# Patient Record
Sex: Female | Born: 1945 | Race: White | Hispanic: No | Smoking: Former smoker
Health system: Southern US, Community
[De-identification: ages and names within clinical notes are randomized; demographics above are authoritative.]

## PROBLEM LIST (undated history)

## (undated) DIAGNOSIS — J45909 Unspecified asthma, uncomplicated: Secondary | ICD-10-CM

## (undated) DIAGNOSIS — K5792 Diverticulitis of intestine, part unspecified, without perforation or abscess without bleeding: Secondary | ICD-10-CM

## (undated) DIAGNOSIS — E039 Hypothyroidism, unspecified: Secondary | ICD-10-CM

## (undated) DIAGNOSIS — L4050 Arthropathic psoriasis, unspecified: Secondary | ICD-10-CM

## (undated) HISTORY — DX: Diverticulitis of intestine, part unspecified, without perforation or abscess without bleeding: K57.92

## (undated) HISTORY — PX: HIP SURGERY: SHX245

## (undated) HISTORY — DX: Hypothyroidism, unspecified: E03.9

## (undated) HISTORY — DX: Arthropathic psoriasis, unspecified: L40.50

## (undated) HISTORY — PX: TONSILLECTOMY: SUR1361

## (undated) HISTORY — DX: Unspecified asthma, uncomplicated: J45.909

## (undated) HISTORY — PX: TUBAL LIGATION: SHX77

---

## 2006-03-13 DIAGNOSIS — S83289A Other tear of lateral meniscus, current injury, unspecified knee, initial encounter: Secondary | ICD-10-CM | POA: Insufficient documentation

## 2006-04-24 DIAGNOSIS — M47812 Spondylosis without myelopathy or radiculopathy, cervical region: Secondary | ICD-10-CM | POA: Insufficient documentation

## 2006-10-10 ENCOUNTER — Emergency Department (HOSPITAL_COMMUNITY): Admission: EM | Admit: 2006-10-10 | Discharge: 2006-10-10 | Payer: Self-pay | Admitting: Family Medicine

## 2010-09-06 DIAGNOSIS — F32A Depression, unspecified: Secondary | ICD-10-CM | POA: Insufficient documentation

## 2010-09-06 DIAGNOSIS — Z5181 Encounter for therapeutic drug level monitoring: Secondary | ICD-10-CM | POA: Insufficient documentation

## 2010-09-06 DIAGNOSIS — M545 Low back pain, unspecified: Secondary | ICD-10-CM | POA: Insufficient documentation

## 2010-09-06 DIAGNOSIS — M25559 Pain in unspecified hip: Secondary | ICD-10-CM | POA: Insufficient documentation

## 2010-09-06 DIAGNOSIS — E039 Hypothyroidism, unspecified: Secondary | ICD-10-CM | POA: Insufficient documentation

## 2010-09-06 DIAGNOSIS — J45909 Unspecified asthma, uncomplicated: Secondary | ICD-10-CM | POA: Insufficient documentation

## 2010-09-06 DIAGNOSIS — E538 Deficiency of other specified B group vitamins: Secondary | ICD-10-CM | POA: Insufficient documentation

## 2010-09-06 DIAGNOSIS — G629 Polyneuropathy, unspecified: Secondary | ICD-10-CM | POA: Insufficient documentation

## 2010-09-06 DIAGNOSIS — H9319 Tinnitus, unspecified ear: Secondary | ICD-10-CM | POA: Insufficient documentation

## 2010-09-06 DIAGNOSIS — M1990 Unspecified osteoarthritis, unspecified site: Secondary | ICD-10-CM | POA: Insufficient documentation

## 2010-09-06 DIAGNOSIS — L409 Psoriasis, unspecified: Secondary | ICD-10-CM | POA: Insufficient documentation

## 2010-09-06 DIAGNOSIS — Z889 Allergy status to unspecified drugs, medicaments and biological substances status: Secondary | ICD-10-CM | POA: Insufficient documentation

## 2010-09-06 DIAGNOSIS — F329 Major depressive disorder, single episode, unspecified: Secondary | ICD-10-CM | POA: Insufficient documentation

## 2011-09-26 DIAGNOSIS — E785 Hyperlipidemia, unspecified: Secondary | ICD-10-CM | POA: Insufficient documentation

## 2012-08-07 DIAGNOSIS — I10 Essential (primary) hypertension: Secondary | ICD-10-CM | POA: Insufficient documentation

## 2014-08-19 DIAGNOSIS — M940 Chondrocostal junction syndrome [Tietze]: Secondary | ICD-10-CM | POA: Insufficient documentation

## 2014-08-19 DIAGNOSIS — R911 Solitary pulmonary nodule: Secondary | ICD-10-CM | POA: Insufficient documentation

## 2014-08-19 DIAGNOSIS — R079 Chest pain, unspecified: Secondary | ICD-10-CM | POA: Insufficient documentation

## 2016-04-10 DIAGNOSIS — M170 Bilateral primary osteoarthritis of knee: Secondary | ICD-10-CM | POA: Insufficient documentation

## 2019-02-24 ENCOUNTER — Ambulatory Visit (INDEPENDENT_AMBULATORY_CARE_PROVIDER_SITE_OTHER): Payer: Medicare Other | Admitting: Allergy and Immunology

## 2019-02-24 ENCOUNTER — Encounter: Payer: Self-pay | Admitting: Allergy and Immunology

## 2019-02-24 ENCOUNTER — Other Ambulatory Visit: Payer: Self-pay

## 2019-02-24 VITALS — BP 128/70 | HR 72 | Temp 97.1°F | Resp 16 | Ht 68.0 in | Wt 192.5 lb

## 2019-02-24 DIAGNOSIS — J4540 Moderate persistent asthma, uncomplicated: Secondary | ICD-10-CM

## 2019-02-24 DIAGNOSIS — H1010 Acute atopic conjunctivitis, unspecified eye: Secondary | ICD-10-CM | POA: Insufficient documentation

## 2019-02-24 DIAGNOSIS — H1013 Acute atopic conjunctivitis, bilateral: Secondary | ICD-10-CM | POA: Diagnosis not present

## 2019-02-24 DIAGNOSIS — J3089 Other allergic rhinitis: Secondary | ICD-10-CM | POA: Diagnosis not present

## 2019-02-24 DIAGNOSIS — J302 Other seasonal allergic rhinitis: Secondary | ICD-10-CM | POA: Insufficient documentation

## 2019-02-24 MED ORDER — OLOPATADINE HCL 0.2 % OP SOLN: 1.0000 [drp] | OPHTHALMIC | 3 refills | Status: DC

## 2019-02-24 MED ORDER — MONTELUKAST SODIUM 10 MG PO TABS: 10.0000 mg | ORAL_TABLET | Freq: Every day | ORAL | 5 refills | Status: DC

## 2019-02-24 MED ORDER — BUDESONIDE-FORMOTEROL FUMARATE 160-4.5 MCG/ACT IN AERO: 2.0000 | INHALATION_SPRAY | Freq: Two times a day (BID) | RESPIRATORY_TRACT | 5 refills | Status: DC

## 2019-02-24 MED ORDER — ALBUTEROL SULFATE HFA 108 (90 BASE) MCG/ACT IN AERS: 1.0000 | INHALATION_SPRAY | RESPIRATORY_TRACT | 1 refills | Status: DC | PRN

## 2019-02-24 MED ORDER — AZELASTINE HCL 0.1 % NA SOLN: 1.0000 | Freq: Two times a day (BID) | NASAL | 5 refills | Status: DC | PRN

## 2019-02-24 NOTE — Assessment & Plan Note (Signed)
   Treatment plan as outlined above for allergic rhinitis.  A prescription has been provided for Pataday, one drop per eye daily as needed.  Eye lubricant drops (i.e., Natural Tears) as needed.

## 2019-02-24 NOTE — Assessment & Plan Note (Addendum)
   A refill prescription has been provided for Symbicort 160-4.5 g, 2 inhalations twice daily.  To maximize pulmonary deposition, a spacer has been provided along with instructions for its proper administration with an HFA inhaler.  A refill prescription has been provided for montelukast 10 mg daily at bedtime.  Continue albuterol HFA, 1 to 2 inhalations every 4-6 hours if needed.  Subjective and objective measures of pulmonary function will be followed and the treatment plan will be adjusted accordingly.

## 2019-02-24 NOTE — Patient Instructions (Addendum)
Moderate persistent asthma  A refill prescription has been provided for Symbicort 160-4.5 g, 2 inhalations twice daily.  To maximize pulmonary deposition, a spacer has been provided along with instructions for its proper administration with an HFA inhaler.  A refill prescription has been provided for montelukast 10 mg daily at bedtime.  Continue albuterol HFA, 1 to 2 inhalations every 4-6 hours if needed.  Subjective and objective measures of pulmonary function will be followed and the treatment plan will be adjusted accordingly.  Perennial and seasonal allergic rhinitis  We have requested copies of the patient's medical record from her previous allergist in [place].  Continue appropriate allergen avoidance measures.  I have recommended fexofenadine (Allegra) 180 mg daily if needed.  A prescription has been provided for azelastine nasal spray, 1-2 sprays per nostril 2 times daily as needed. Proper nasal spray technique has been discussed and demonstrated.   Nasal saline spray (i.e., Simply Saline) or nasal saline lavage (i.e., NeilMed) is recommended as needed and prior to medicated nasal sprays.  If allergen avoidance measures and medications fail to adequately relieve symptoms we will retest in 1 year and aeroallergen immunotherapy will be considered.  Allergic conjunctivitis  Treatment plan as outlined above for allergic rhinitis.  A prescription has been provided for Pataday, one drop per eye daily as needed.  Eye lubricant drops (i.e., Natural Tears) as needed.   Return in about 3 months (around 05/26/2019), or if symptoms worsen or fail to improve.  Control of Dust Mite Allergen  House dust mites play a major role in allergic asthma and rhinitis.  They occur in environments with high humidity wherever human skin, the food for dust mites is found. High levels have been detected in dust obtained from mattresses, pillows, carpets, upholstered furniture, bed covers, clothes  and soft toys.  The principal allergen of the house dust mite is found in its feces.  A gram of dust may contain 1,000 mites and 250,000 fecal particles.  Mite antigen is easily measured in the air during house cleaning activities.    1. Encase mattresses, including the box spring, and pillow, in an air tight cover.  Seal the zipper end of the encased mattresses with wide adhesive tape. 2. Wash the bedding in water of 130 degrees Farenheit weekly.  Avoid cotton comforters/quilts and flannel bedding: the most ideal bed covering is the dacron comforter. 3. Remove all upholstered furniture from the bedroom. 4. Remove carpets, carpet padding, rugs, and non-washable window drapes from the bedroom.  Wash drapes weekly or use plastic window coverings. 5. Remove all non-washable stuffed toys from the bedroom.  Wash stuffed toys weekly. 6. Have the room cleaned frequently with a vacuum cleaner and a damp dust-mop.  The patient should not be in a room which is being cleaned and should wait 1 hour after cleaning before going into the room. 7. Close and seal all heating outlets in the bedroom.  Otherwise, the room will become filled with dust-laden air.  An electric heater can be used to heat the room. Reduce indoor humidity to less than 50%.  Do not use a humidifier.   Reducing Pollen Exposure  The American Academy of Allergy, Asthma and Immunology suggests the following steps to reduce your exposure to pollen during allergy seasons.    1. Do not hang sheets or clothing out to dry; pollen may collect on these items. 2. Do not mow lawns or spend time around freshly cut grass; mowing stirs up pollen. 3. Keep windows  closed at night.  Keep car windows closed while driving. 4. Minimize morning activities outdoors, a time when pollen counts are usually at their highest. 5. Stay indoors as much as possible when pollen counts or humidity is high and on windy days when pollen tends to remain in the air  longer. 6. Use air conditioning when possible.  Many air conditioners have filters that trap the pollen spores. 7. Use a HEPA room air filter to remove pollen form the indoor air you breathe.   Control of Dog or Cat Allergen  Avoidance is the best way to manage a dog or cat allergy. If you have a dog or cat and are allergic to dog or cats, consider removing the dog or cat from the home. If you have a dog or cat but don't want to find it a new home, or if your family wants a pet even though someone in the household is allergic, here are some strategies that may help keep symptoms at bay:  1. Keep the pet out of your bedroom and restrict it to only a few rooms. Be advised that keeping the dog or cat in only one room will not limit the allergens to that room. 2. Don't pet, hug or kiss the dog or cat; if you do, wash your hands with soap and water. 3. High-efficiency particulate air (HEPA) cleaners run continuously in a bedroom or living room can reduce allergen levels over time. 4. Place electrostatic material sheet in the air inlet vent in the bedroom. 5. Regular use of a high-efficiency vacuum cleaner or a central vacuum can reduce allergen levels. 6. Giving your dog or cat a bath at least once a week can reduce airborne allergen.   Control of Mold Allergen  Mold and fungi can grow on a variety of surfaces provided certain temperature and moisture conditions exist.  Outdoor molds grow on plants, decaying vegetation and soil.  The major outdoor mold, Alternaria and Cladosporium, are found in very high numbers during hot and dry conditions.  Generally, a late Summer - Fall peak is seen for common outdoor fungal spores.  Rain will temporarily lower outdoor mold spore count, but counts rise rapidly when the rainy period ends.  The most important indoor molds are Aspergillus and Penicillium.  Dark, humid and poorly ventilated basements are ideal sites for mold growth.  The next most common sites of  mold growth are the bathroom and the kitchen.  Outdoor [employer] 1. Use air conditioning and keep windows closed 2. Avoid exposure to decaying vegetation. 3. Avoid leaf raking. 4. Avoid grain handling. 5. Consider wearing a face mask if working in moldy areas.  Indoor Mold Control 1. Maintain humidity below 50%. 2. Clean washable surfaces with 5% bleach solution. 3. Remove sources e.g. Contaminated carpets.   Control of Cockroach Allergen  Cockroach allergen has been identified as an important cause of acute attacks of asthma, especially in urban settings.  There are fifty-five species of cockroach that exist in the [place], however only three, the [place], [place] species produce allergen that can affect patients with Asthma.  Allergens can be obtained from fecal particles, egg casings and secretions from cockroaches.    1. Remove food sources. 2. Reduce access to water. 3. Seal access and entry points. 4. Spray runways with 0.5-1% Diazinon or Chlorpyrifos 5. Blow boric acid power under stoves and refrigerator. 6. Place bait stations (hydramethylnon) at feeding sites.

## 2019-02-24 NOTE — Assessment & Plan Note (Signed)
   We have requested copies of the patient's medical record from her previous allergist in [place].  Continue appropriate allergen avoidance measures.  I have recommended fexofenadine (Allegra) 180 mg daily if needed.  A prescription has been provided for azelastine nasal spray, 1-2 sprays per nostril 2 times daily as needed. Proper nasal spray technique has been discussed and demonstrated.   Nasal saline spray (i.e., Simply Saline) or nasal saline lavage (i.e., NeilMed) is recommended as needed and prior to medicated nasal sprays.  If allergen avoidance measures and medications fail to adequately relieve symptoms we will retest in 1 year and aeroallergen immunotherapy will be considered.

## 2019-02-24 NOTE — Progress Notes (Signed)
New Patient Note  RE: [name] MRN: [id number] DOB: 1945-09-05 Date of Office Visit: 02/24/2019  Referring provider: No ref. provider found Primary care provider: [name], Fransico Him, MD  Chief Complaint: Allergic Rhinitis  and Asthma  History of present illness: [name] [name] is a 73 y.o. female presenting today for evaluation of allergic rhinitis and asthma.  She moved from [place] to [place] approximately 1 month ago.  She has been on immunotherapy injections over the past 2 years and had her last injection approximately 2 months ago.  She reports that she was skin test positive to dog, cat, dust mite, mold, and pollen.  She attempts to control her nasal/sinus symptoms with loratadine, montelukast, and fluticasone nasal spray.  She carries a diagnosis of asthma which is currently stable with Symbicort 160-4.5 g, 2 inhalations twice daily, and montelukast 10 mg daily.  She does not use a spacer device with her HFA inhalers.  She does not experience limitations in normal daily activities or nocturnal awakenings due to lower respiratory symptoms.  She had been prescribed Spiriva at one point, however discontinued because she did not perceive benefit from this medication.  Assessment and plan: Moderate persistent asthma  A refill prescription has been provided for Symbicort 160-4.5 g, 2 inhalations twice daily.  To maximize pulmonary deposition, a spacer has been provided along with instructions for its proper administration with an HFA inhaler.  A refill prescription has been provided for montelukast 10 mg daily at bedtime.  Continue albuterol HFA, 1 to 2 inhalations every 4-6 hours if needed.  Subjective and objective measures of pulmonary function will be followed and the treatment plan will be adjusted accordingly.  Perennial and seasonal allergic rhinitis  We have requested copies of the patient's medical record from her previous allergist in [place].   Continue appropriate allergen avoidance measures.  I have recommended fexofenadine (Allegra) 180 mg daily if needed.  A prescription has been provided for azelastine nasal spray, 1-2 sprays per nostril 2 times daily as needed. Proper nasal spray technique has been discussed and demonstrated.   Nasal saline spray (i.e., Simply Saline) or nasal saline lavage (i.e., NeilMed) is recommended as needed and prior to medicated nasal sprays.  If allergen avoidance measures and medications fail to adequately relieve symptoms we will retest in 1 year and aeroallergen immunotherapy will be considered.  Allergic conjunctivitis  Treatment plan as outlined above for allergic rhinitis.  A prescription has been provided for Pataday, one drop per eye daily as needed.  Eye lubricant drops (i.e., Natural Tears) as needed.   Meds ordered this encounter  Medications  . budesonide-formoterol (SYMBICORT) 160-4.5 MCG/ACT inhaler    Sig: Inhale 2 puffs into the lungs 2 (two) times daily.    Dispense:  1 Inhaler    Refill:  5  . montelukast (SINGULAIR) 10 MG tablet    Sig: Take 1 tablet (10 mg total) by mouth at bedtime.    Dispense:  30 tablet    Refill:  5  . azelastine (ASTELIN) 0.1 % nasal spray    Sig: Place 1-2 sprays into both nostrils 2 (two) times daily as needed for rhinitis.    Dispense:  30 mL    Refill:  5  . albuterol (VENTOLIN HFA) 108 (90 Base) MCG/ACT inhaler    Sig: Inhale 1-2 puffs into the lungs every 4 (four) hours as needed for wheezing or shortness of breath.    Dispense:  18 g    Refill:  1  . Olopatadine HCl (PATADAY) 0.2 % SOLN    Sig: Place 1 drop into both eyes 1 day or 1 dose.    Dispense:  2.5 mL    Refill:  3    Diagnostics: Spirometry: FVC was 2.80 L and FEV1 was 1.98 L (75% predicted) with 110 mL (6%) postbronchodilator improvement.  This study was performed while the patient was asymptomatic.  Please see scanned spirometry results for details.  Physical  examination: Blood pressure 128/70, pulse 72, temperature (!) 97.1 F (36.2 C), temperature source Temporal, resp. rate 16, height 5\' 8"  (1.727 m), weight 192 lb 8 oz (87.3 kg), SpO2 97 %.  General: Alert, interactive, in no acute distress. HEENT: TMs pearly gray, turbinates moderately edematous without discharge, post-pharynx mildly erythematous. Neck: Supple without lymphadenopathy. Lungs: Clear to auscultation without wheezing, rhonchi or rales. CV: Normal S1, S2 without murmurs. Abdomen: Nondistended, nontender. Skin: Warm and dry, without lesions or rashes. Extremities:  No clubbing, cyanosis or edema. Neuro:   Grossly intact.  Review of systems:  Review of systems negative except as noted in HPI / PMHx or noted below: Review of Systems  Constitutional: Negative.   HENT: Negative.   Eyes: Negative.   Respiratory: Negative.   Cardiovascular: Negative.   Gastrointestinal: Negative.   Genitourinary: Negative.   Musculoskeletal: Negative.   Skin: Negative.   Neurological: Negative.   Endo/Heme/Allergies: Negative.   Psychiatric/Behavioral: Negative.     Past medical history:  Past Medical History:  Diagnosis Date  . Asthma     Past surgical history:  Past Surgical History:  Procedure Laterality Date  . TONSILLECTOMY      Family history: Family History  Problem Relation Age of Onset  . Asthma Mother   . Allergic rhinitis Mother   . Allergic rhinitis Father   . Eczema Brother   . Allergic rhinitis Brother     Social history: Social History   Socioeconomic History  . Marital status: Married    Spouse name: Not on file  . Number of children: Not on file  . Years of education: Not on file  . Highest education level: Not on file  Occupational History  . Not on file  Social Needs  . Financial resource strain: Not on file  . Food insecurity    Worry: Not on file    Inability: Not on file  . Transportation needs    Medical: Not on file    Non-medical:  Not on file  Tobacco Use  . Smoking status: Former Research scientist (life sciences)  . Smokeless tobacco: Never Used  Substance and Sexual Activity  . Alcohol use: Not on file  . Drug use: Not on file  . Sexual activity: Not on file  Lifestyle  . Physical activity    Days per week: Not on file    Minutes per session: Not on file  . Stress: Not on file  Relationships  . Social Herbalist on phone: Not on file    Gets together: Not on file    Attends religious service: Not on file    Active member of club or organization: Not on file    Attends meetings of clubs or organizations: Not on file    Relationship status: Not on file  . Intimate partner violence    Fear of current or ex partner: Not on file    Emotionally abused: Not on file    Physically abused: Not on file    Forced sexual activity:  Not on file  Other Topics Concern  . Not on file  Social History Narrative  . Not on file   Environmental History: The patient lives in a condominium built in the 1970s with hardwood floors throughout and central air/heat.  There is no known mold/water damage in the home.  There are no pets in the home.  She is a former cigarette smoker having quit in the year 2000.  Allergies as of 02/24/2019      Reactions   Latex Itching, Rash      Medication List       Accurate as of February 24, 2019 10:25 PM. If you have any questions, ask your nurse or doctor.        albuterol 108 (90 Base) MCG/ACT inhaler Commonly known as: VENTOLIN HFA Inhale 1-2 puffs into the lungs every 4 (four) hours as needed for wheezing or shortness of breath. What changed:   how much to take  when to take this  reasons to take this Changed by: R Edgar Frisk, MD   azelastine 0.1 % nasal spray Commonly known as: ASTELIN Place 1-2 sprays into both nostrils 2 (two) times daily as needed for rhinitis. Started by: Edmonia Lynch, MD   b complex vitamins tablet Take 1 tablet by mouth daily.   budesonide-formoterol  160-4.5 MCG/ACT inhaler Commonly known as: Symbicort Inhale 2 puffs into the lungs 2 (two) times daily. What changed:   how much to take  when to take this Changed by: R Edgar Frisk, MD   fluticasone 50 MCG/ACT nasal spray Commonly known as: FLONASE SHAKE WELL AND USE 1 SPRAY IN EACH NOSTRIL DAILY   folic acid 1 MG tablet Commonly known as: FOLVITE TAKE 1 TABLET BY MOUTH DAILY   gabapentin 800 MG tablet Commonly known as: NEURONTIN TK 1 T PO EACH NIGHT   ibuprofen 200 MG tablet Commonly known as: ADVIL Take by mouth.   levothyroxine 112 MCG tablet Commonly known as: SYNTHROID Take by mouth.   loratadine 10 MG tablet Commonly known as: CLARITIN Take by mouth.   Magnesium 250 MG Tabs Take 250 mg by mouth daily.   methotrexate 2.5 MG tablet Commonly known as: RHEUMATREX Take by mouth.   montelukast 10 MG tablet Commonly known as: SINGULAIR Take 1 tablet (10 mg total) by mouth at bedtime.   Olopatadine HCl 0.2 % Soln Commonly known as: Pataday Place 1 drop into both eyes 1 day or 1 dose. Started by: Edmonia Lynch, MD   tiotropium 18 MCG inhalation capsule Commonly known as: SPIRIVA Place 18 mcg into inhaler and inhale daily.   Vitamin D3 25 MCG (1000 UT) Caps Take by mouth.       Known medication allergies: Allergies  Allergen Reactions  . Latex Itching and Rash    I appreciate the opportunity to take part in Jniyah's care. Please do not hesitate to contact me with questions.  Sincerely,   R. Edgar Frisk, MD

## 2019-02-26 ENCOUNTER — Ambulatory Visit (INDEPENDENT_AMBULATORY_CARE_PROVIDER_SITE_OTHER): Payer: Medicare Other

## 2019-02-26 ENCOUNTER — Encounter: Payer: Self-pay | Admitting: Podiatry

## 2019-02-26 ENCOUNTER — Other Ambulatory Visit: Payer: Self-pay

## 2019-02-26 ENCOUNTER — Ambulatory Visit (INDEPENDENT_AMBULATORY_CARE_PROVIDER_SITE_OTHER): Payer: Medicare Other | Admitting: Podiatry

## 2019-02-26 ENCOUNTER — Other Ambulatory Visit: Payer: Self-pay | Admitting: Podiatry

## 2019-02-26 VITALS — BP 104/61 | HR 62 | Resp 16

## 2019-02-26 DIAGNOSIS — Q828 Other specified congenital malformations of skin: Secondary | ICD-10-CM | POA: Diagnosis not present

## 2019-02-26 DIAGNOSIS — M79676 Pain in unspecified toe(s): Secondary | ICD-10-CM

## 2019-02-26 DIAGNOSIS — M778 Other enthesopathies, not elsewhere classified: Secondary | ICD-10-CM

## 2019-02-26 DIAGNOSIS — M7751 Other enthesopathy of right foot: Secondary | ICD-10-CM

## 2019-02-26 DIAGNOSIS — M7752 Other enthesopathy of left foot: Secondary | ICD-10-CM

## 2019-02-26 DIAGNOSIS — M779 Enthesopathy, unspecified: Secondary | ICD-10-CM | POA: Diagnosis not present

## 2019-02-26 DIAGNOSIS — B351 Tinea unguium: Secondary | ICD-10-CM | POA: Diagnosis not present

## 2019-02-26 DIAGNOSIS — J411 Mucopurulent chronic bronchitis: Secondary | ICD-10-CM | POA: Insufficient documentation

## 2019-02-26 DIAGNOSIS — M722 Plantar fascial fibromatosis: Secondary | ICD-10-CM

## 2019-02-28 NOTE — Progress Notes (Signed)
Subjective:  Patient ID: [name] [name], female    DOB: November 25, 1945,  MRN: [id number] HPI Chief Complaint  Patient presents with  . Foot Pain    Sub 5th MPJ bilateral - aching, callused x few months, tried to shave down some  . Toe Pain    Hallux left - medial border, sensitive, red x few weeks, tried trimming  . Toe Pain    Concerned about hammertoe deformity  . New Patient (Initial Visit)    73 y.o. female presents with the above complaint.   ROS: Denies fever chills nausea vomiting muscle aches pains calf pain back pain chest pain shortness of breath.  Past Medical History:  Diagnosis Date  . Asthma    Past Surgical History:  Procedure Laterality Date  . TONSILLECTOMY      Current Outpatient Medications:  .  BENFOTIAMINE PO, Take by mouth., Disp: , Rfl:  .  Certolizumab Pegol (CIMZIA Calloway), Inject into the skin., Disp: , Rfl:  .  UNABLE TO FIND, Med Name: Liver enzymes, Disp: , Rfl:  .  urea (CARMOL) 40 % CREA, Apply topically daily., Disp: , Rfl:  .  Zinc 10 MG LOZG, Use as directed in the mouth or throat., Disp: , Rfl:  .  albuterol (VENTOLIN HFA) 108 (90 Base) MCG/ACT inhaler, Inhale 1-2 puffs into the lungs every 4 (four) hours as needed for wheezing or shortness of breath., Disp: 18 g, Rfl: 1 .  azelastine (ASTELIN) 0.1 % nasal spray, Place 1-2 sprays into both nostrils 2 (two) times daily as needed for rhinitis., Disp: 30 mL, Rfl: 5 .  b complex vitamins tablet, Take 1 tablet by mouth daily., Disp: , Rfl:  .  budesonide-formoterol (SYMBICORT) 160-4.5 MCG/ACT inhaler, Inhale 2 puffs into the lungs 2 (two) times daily., Disp: 1 Inhaler, Rfl: 5 .  Cholecalciferol (VITAMIN D3) 25 MCG (1000 UT) CAPS, Take by mouth., Disp: , Rfl:  .  fluticasone (FLONASE) 50 MCG/ACT nasal spray, SHAKE WELL AND USE 1 SPRAY IN EACH NOSTRIL DAILY, Disp: , Rfl:  .  folic acid (FOLVITE) 1 MG tablet, TAKE 1 TABLET BY MOUTH DAILY, Disp: , Rfl:  .  gabapentin (NEURONTIN) 800 MG tablet, TK 1 T PO  EACH NIGHT, Disp: , Rfl:  .  levothyroxine (SYNTHROID) 112 MCG tablet, Take by mouth., Disp: , Rfl:  .  loratadine (CLARITIN) 10 MG tablet, Take by mouth., Disp: , Rfl:  .  Magnesium 250 MG TABS, Take 250 mg by mouth daily., Disp: , Rfl:  .  methotrexate (RHEUMATREX) 2.5 MG tablet, Take by mouth., Disp: , Rfl:  .  montelukast (SINGULAIR) 10 MG tablet, Take 1 tablet (10 mg total) by mouth at bedtime., Disp: 30 tablet, Rfl: 5 .  Olopatadine HCl (PATADAY) 0.2 % SOLN, Place 1 drop into both eyes 1 day or 1 dose., Disp: 2.5 mL, Rfl: 3 .  tiotropium (SPIRIVA) 18 MCG inhalation capsule, Place 18 mcg into inhaler and inhale daily., Disp: , Rfl:   Allergies  Allergen Reactions  . Latex Itching and Rash   Review of Systems Objective:   Vitals:   02/26/19 1029  BP: 104/61  Pulse: 62  Resp: 16    General: Well developed, nourished, in no acute distress, alert and oriented x3   Dermatological: Skin is warm, dry and supple bilateral. Nails x 10 are well maintained; remaining integument appears unremarkable at this time. There are no open sores, no preulcerative lesions, no rash or signs of infection present.  She has  a callus porokeratotic lesion sub-fifth metatarsal head bilaterally with underlying bogginess most likely consistent with bursitis.  Also has a sharp incurvated nail margin hallux left.  The medial margin is sensitive and tender to her.  Vascular: Dorsalis Pedis artery and Posterior Tibial artery pedal pulses are 2/4 bilateral with immedate capillary fill time. Pedal hair growth present. No varicosities and no lower extremity edema present bilateral.   Neruologic: Grossly intact via light touch bilateral. Vibratory intact via tuning fork bilateral. Protective threshold with Semmes Wienstein monofilament intact to all pedal sites bilateral. Patellar and Achilles deep tendon reflexes 2+ bilateral. No Babinski or clonus noted bilateral.   Musculoskeletal: No gross boney pedal deformities  bilateral. No pain, crepitus, or limitation noted with foot and ankle range of motion bilateral. Muscular strength 5/5 in all groups tested bilateral.  Gait: Unassisted, Nonantalgic.    Radiographs:  Radiographs taken today demonstrate only a slightly plantarflexed fifth metatarsal bilateral no other acute findings.  Assessment & Plan:   Assessment: Capsulitis bursitis fifth metatarsal phalangeal joint bilateral painful ingrown nail medial border hallux left.  Plan: I injected the bursitis 2 mg of dexamethasone and local anesthetic sub-fifth met head bilaterally I then debrided the porokeratotic lesions sub-fifth met head bilaterally.  Also debrided the nail there was painful for her.  And I will follow-up with her on an as-needed basis.      T. [place], [place]

## 2019-03-09 ENCOUNTER — Ambulatory Visit: Payer: Self-pay | Admitting: Allergy and Immunology

## 2019-04-08 ENCOUNTER — Other Ambulatory Visit: Payer: Self-pay | Admitting: Allergy and Immunology

## 2019-05-25 ENCOUNTER — Encounter: Payer: Self-pay | Admitting: Allergy and Immunology

## 2019-05-25 ENCOUNTER — Other Ambulatory Visit: Payer: Self-pay

## 2019-05-25 ENCOUNTER — Ambulatory Visit (INDEPENDENT_AMBULATORY_CARE_PROVIDER_SITE_OTHER): Payer: Medicare Other | Admitting: Allergy and Immunology

## 2019-05-25 VITALS — BP 110/68 | HR 84 | Temp 97.7°F | Resp 16

## 2019-05-25 DIAGNOSIS — J3089 Other allergic rhinitis: Secondary | ICD-10-CM | POA: Diagnosis not present

## 2019-05-25 DIAGNOSIS — J4540 Moderate persistent asthma, uncomplicated: Secondary | ICD-10-CM

## 2019-05-25 DIAGNOSIS — M3500 Sicca syndrome, unspecified: Secondary | ICD-10-CM | POA: Diagnosis not present

## 2019-05-25 MED ORDER — MONTELUKAST SODIUM 10 MG PO TABS: 10.0000 mg | ORAL_TABLET | Freq: Every day | ORAL | 5 refills | Status: DC

## 2019-05-25 MED ORDER — AZELASTINE HCL 0.1 % NA SOLN: 1.0000 | Freq: Two times a day (BID) | NASAL | 5 refills | Status: DC | PRN

## 2019-05-25 MED ORDER — BUDESONIDE-FORMOTEROL FUMARATE 160-4.5 MCG/ACT IN AERO: 2.0000 | INHALATION_SPRAY | Freq: Two times a day (BID) | RESPIRATORY_TRACT | 5 refills | Status: DC

## 2019-05-25 NOTE — Assessment & Plan Note (Signed)
   Attempt to decrease the use of second-generation antihistamines, such as fexofenadine (Allegra).  Continue the use of eye lubricant drops.  The patient will notify her rheumatologist of the symptoms.  As she just had blood drawn yesterday, additional labs may be added to rule out potential causes of sicca syndrome.

## 2019-05-25 NOTE — Patient Instructions (Addendum)
Moderate persistent asthma Well-controlled.  Continue Symbicort 160-4.5 g, 2 inhalations via spacer device twice daily, montelukast 10 mg daily at bedtime, and albuterol HFA, 1 to 2 inhalations every 4-6 hours if needed.  If subjective and objective measures of pulmonary function remain stable, we will consider stepping down therapy on the next visit.  Perennial and seasonal allergic rhinitis  Continue appropriate allergen avoidance measures, montelukast 10 mg daily, azelastine nasal spray, 1-2 sprays per nostril 2 times daily as needed.  Nasal saline spray (i.e., Simply Saline) or nasal saline lavage (i.e., NeilMed) is recommended as needed and prior to medicated nasal sprays.  If allergen avoidance measures and medications fail to adequately relieve symptoms we will retest in 1 year and aeroallergen immunotherapy will be considered.  Dry eyes/dry mouth  Attempt to decrease the use of second-generation antihistamines, such as fexofenadine (Allegra).  Continue the use of eye lubricant drops.  The patient will notify her rheumatologist of the symptoms.  As she just had blood drawn yesterday, additional labs may be added to rule out potential causes of sicca syndrome.   Return in about 4 months (around 09/23/2019), or if symptoms worsen or fail to improve.

## 2019-05-25 NOTE — Assessment & Plan Note (Signed)
   Continue appropriate allergen avoidance measures, montelukast 10 mg daily, azelastine nasal spray, 1-2 sprays per nostril 2 times daily as needed.  Nasal saline spray (i.e., Simply Saline) or nasal saline lavage (i.e., NeilMed) is recommended as needed and prior to medicated nasal sprays.  If allergen avoidance measures and medications fail to adequately relieve symptoms we will retest in 1 year and aeroallergen immunotherapy will be considered.

## 2019-05-25 NOTE — Assessment & Plan Note (Signed)
Well-controlled.  Continue Symbicort 160-4.5 g, 2 inhalations via spacer device twice daily, montelukast 10 mg daily at bedtime, and albuterol HFA, 1 to 2 inhalations every 4-6 hours if needed.  If subjective and objective measures of pulmonary function remain stable, we will consider stepping down therapy on the next visit.

## 2019-05-25 NOTE — Progress Notes (Signed)
Follow-up Note  RE: [name] [name] MRN: [id number] DOB: Aug 13, 1945 Date of Office Visit: 05/25/2019  Primary care provider: Haywood Pao, MD Referring provider: Haywood Pao, MD  History of present illness: [name] [name] is a 73 y.o. female with persistent asthma and allergic rhinitis resenting today for follow-up.  She was previously seen in this clinic for her initial evaluation on February 25, 2019.  She reports that her asthma has been well controlled.  While taking Symbicort 160-4.5 g, 2 inhalations via spacer device twice daily, and montelukast 10 mg daily at bedtime, she has not required asthma rescue medication or experienced limitations in normal daily activities or nocturnal awakenings due to lower respiratory symptoms.  She denies side effects from montelukast and Symbicort.  She reports that her nasal allergy symptoms are well controlled.  However, she does complain of dry mouth and dry eyes.  She reports that at nighttime it feels like there is "grit" in her eyes despite using eye lubricant drops.  She has autoimmune disease and was just seen by her rheumatologist a few days ago and had blood work drawn, however states that she has never mentioned the problem of dry eyes and dry mouth to her rheumatologist.  Assessment and plan: Moderate persistent asthma Well-controlled.  Continue Symbicort 160-4.5 g, 2 inhalations via spacer device twice daily, montelukast 10 mg daily at bedtime, and albuterol HFA, 1 to 2 inhalations every 4-6 hours if needed.  If subjective and objective measures of pulmonary function remain stable, we will consider stepping down therapy on the next visit.  Perennial and seasonal allergic rhinitis  Continue appropriate allergen avoidance measures, montelukast 10 mg daily, azelastine nasal spray, 1-2 sprays per nostril 2 times daily as needed.  Nasal saline spray (i.e., Simply Saline) or nasal saline lavage (i.e., NeilMed) is  recommended as needed and prior to medicated nasal sprays.  If allergen avoidance measures and medications fail to adequately relieve symptoms we will retest in 1 year and aeroallergen immunotherapy will be considered.  Dry eyes/dry mouth  Attempt to decrease the use of second-generation antihistamines, such as fexofenadine (Allegra).  Continue the use of eye lubricant drops.  The patient will notify her rheumatologist of the symptoms.  As she just had blood drawn yesterday, additional labs may be added to rule out potential causes of sicca syndrome.   Meds ordered this encounter  Medications   budesonide-formoterol (SYMBICORT) 160-4.5 MCG/ACT inhaler    Sig: Inhale 2 puffs into the lungs 2 (two) times daily.    Dispense:  1 Inhaler    Refill:  5   montelukast (SINGULAIR) 10 MG tablet    Sig: Take 1 tablet (10 mg total) by mouth at bedtime.    Dispense:  30 tablet    Refill:  5   azelastine (ASTELIN) 0.1 % nasal spray    Sig: Place 1-2 sprays into both nostrils 2 (two) times daily as needed for rhinitis.    Dispense:  30 mL    Refill:  5    Diagnostics: Spirometry:  Normal with an FEV1 of 83% predicted with an FEV1 ratio of 97%. This study was performed while the patient was asymptomatic.  Please see scanned spirometry results for details.    Physical examination: Blood pressure 110/68, pulse 84, temperature 97.7 F (36.5 C), temperature source Temporal, resp. rate 16, SpO2 96 %.  General: Alert, interactive, in no acute distress. HEENT: TMs pearly gray, turbinates minimally edematous without discharge, post-pharynx unremarkable. Neck: Supple without  lymphadenopathy. Lungs: Clear to auscultation without wheezing, rhonchi or rales. CV: Normal S1, S2 without murmurs. Skin: Warm and dry, without lesions or rashes.  The following portions of the patient's history were reviewed and updated as appropriate: allergies, current medications, past family history, past medical  history, past social history, past surgical history and problem list.  Current Outpatient Medications  Medication Sig Dispense Refill   albuterol (VENTOLIN HFA) 108 (90 Base) MCG/ACT inhaler Inhale 1-2 puffs into the lungs every 4 (four) hours as needed for wheezing or shortness of breath. 18 g 1   azelastine (ASTELIN) 0.1 % nasal spray Place 1-2 sprays into both nostrils 2 (two) times daily as needed for rhinitis. 30 mL 5   b complex vitamins tablet Take 1 tablet by mouth daily.     BENFOTIAMINE PO Take by mouth.     budesonide-formoterol (SYMBICORT) 160-4.5 MCG/ACT inhaler Inhale 2 puffs into the lungs 2 (two) times daily. 1 Inhaler 5   Certolizumab Pegol (CIMZIA [place]) Inject into the skin.     Cholecalciferol (VITAMIN D3) 25 MCG (1000 UT) CAPS Take by mouth.     folic acid (FOLVITE) 1 MG tablet TAKE 1 TABLET BY MOUTH DAILY     gabapentin (NEURONTIN) 800 MG tablet TK 1 T PO EACH NIGHT     levothyroxine (SYNTHROID) 112 MCG tablet Take by mouth.     loratadine (CLARITIN) 10 MG tablet Take by mouth.     Magnesium 250 MG TABS Take 250 mg by mouth daily.     methotrexate (RHEUMATREX) 2.5 MG tablet Take by mouth.     montelukast (SINGULAIR) 10 MG tablet Take 1 tablet (10 mg total) by mouth at bedtime. 30 tablet 5   Olopatadine HCl (PATADAY) 0.2 % SOLN Place 1 drop into both eyes 1 day or 1 dose. 2.5 mL 3   tiotropium (SPIRIVA) 18 MCG inhalation capsule Place 18 mcg into inhaler and inhale daily.     UNABLE TO FIND Med Name: Liver enzymes     urea (CARMOL) 40 % CREA Apply topically daily.     Zinc 10 MG LOZG Use as directed in the mouth or throat.     No current facility-administered medications for this visit.     Allergies  Allergen Reactions   Latex Itching and Rash   Review of systems: Review of systems negative except as noted in HPI / PMHx or noted below: Constitutional: Negative.  HENT: Negative.   Eyes: Negative.  Respiratory: Negative.   Cardiovascular:  Negative.  Gastrointestinal: Negative.  Genitourinary: Negative.  Musculoskeletal: Negative.  Neurological: Negative.  Endo/Heme/Allergies: Negative.  Cutaneous: Negative.  Past Medical History:  Diagnosis Date   Asthma     Family History  Problem Relation Age of Onset   Asthma Mother    Allergic rhinitis Mother    Allergic rhinitis Father    Eczema Brother    Allergic rhinitis Brother     Social History   Socioeconomic History   Marital status: Married    Spouse name: Not on file   Number of children: Not on file   Years of education: Not on file   Highest education level: Not on file  Occupational History   Not on file  Social Needs   Financial resource strain: Not on file   Food insecurity    Worry: Not on file    Inability: Not on file   Transportation needs    Medical: Not on file    Non-medical: Not on file  Tobacco Use   Smoking status: Former Smoker   Smokeless tobacco: Never Used  Substance and Sexual Activity   Alcohol use: Never    Frequency: Never   Drug use: Never   Sexual activity: Not on file  Lifestyle   Physical activity    Days per week: Not on file    Minutes per session: Not on file   Stress: Not on file  Relationships   Social connections    Talks on phone: Not on file    Gets together: Not on file    Attends religious service: Not on file    Active member of club or organization: Not on file    Attends meetings of clubs or organizations: Not on file    Relationship status: Not on file   Intimate partner violence    Fear of current or ex partner: Not on file    Emotionally abused: Not on file    Physically abused: Not on file    Forced sexual activity: Not on file  Other Topics Concern   Not on file  Social History Narrative   Not on file    I appreciate the opportunity to take part in Shirlee's care. Please do not hesitate to contact me with questions.  Sincerely,   R. Edgar Frisk, MD

## 2019-05-26 ENCOUNTER — Ambulatory Visit: Payer: Medicare Other | Admitting: Podiatry

## 2019-05-29 ENCOUNTER — Other Ambulatory Visit: Payer: Self-pay

## 2019-05-29 ENCOUNTER — Encounter: Payer: Self-pay | Admitting: Podiatry

## 2019-05-29 ENCOUNTER — Ambulatory Visit (INDEPENDENT_AMBULATORY_CARE_PROVIDER_SITE_OTHER): Payer: Medicare Other | Admitting: Podiatry

## 2019-05-29 DIAGNOSIS — M79671 Pain in right foot: Secondary | ICD-10-CM

## 2019-05-29 DIAGNOSIS — M79672 Pain in left foot: Secondary | ICD-10-CM | POA: Diagnosis not present

## 2019-05-29 DIAGNOSIS — M778 Other enthesopathies, not elsewhere classified: Secondary | ICD-10-CM | POA: Diagnosis not present

## 2019-05-29 DIAGNOSIS — Q828 Other specified congenital malformations of skin: Secondary | ICD-10-CM

## 2019-05-29 NOTE — Progress Notes (Signed)
Subjective:  Patient ID: [name] [name], female    DOB: March 20, 1946,  MRN: [id number]  Chief Complaint  Patient presents with  . Foot Pain    pt is here for bil callous pain, as well as bil capsulitis pain, pt states that the pain she had recently has come back    73 y.o. female presents with the above complaint.  Patient presents with bilateral US submet 5 hyperkeratotic lesion with associated capsulitis.  Patient states that there is started to hurt again.  Patient is well-known to Dr. Milinda Pointer who had done an injection in the past with the debridement of the lesion which helped her considerably.  She states that the calluses unfortunately came back because she has been ambulating a lot on her feet.  She denies any other acute complaints.  She would like to have the calluses debrided at this time.   Review of Systems: Negative except as noted in the HPI. Denies N/V/F/Ch.  Past Medical History:  Diagnosis Date  . Asthma     Current Outpatient Medications:  .  albuterol (VENTOLIN HFA) 108 (90 Base) MCG/ACT inhaler, Inhale 1-2 puffs into the lungs every 4 (four) hours as needed for wheezing or shortness of breath., Disp: 18 g, Rfl: 1 .  azelastine (ASTELIN) 0.1 % nasal spray, Place 1-2 sprays into both nostrils 2 (two) times daily as needed for rhinitis., Disp: 30 mL, Rfl: 5 .  b complex vitamins tablet, Take 1 tablet by mouth daily., Disp: , Rfl:  .  BENFOTIAMINE PO, Take by mouth., Disp: , Rfl:  .  budesonide-formoterol (SYMBICORT) 160-4.5 MCG/ACT inhaler, Inhale 2 puffs into the lungs 2 (two) times daily., Disp: 1 Inhaler, Rfl: 5 .  Certolizumab Pegol (CIMZIA [place]), Inject into the skin., Disp: , Rfl:  .  Cholecalciferol (VITAMIN D3) 25 MCG (1000 UT) CAPS, Take by mouth., Disp: , Rfl:  .  folic acid (FOLVITE) 1 MG tablet, TAKE 1 TABLET BY MOUTH DAILY, Disp: , Rfl:  .  gabapentin (NEURONTIN) 800 MG tablet, TK 1 T PO EACH NIGHT, Disp: , Rfl:  .  levothyroxine (SYNTHROID) 112 MCG tablet,  Take by mouth., Disp: , Rfl:  .  loratadine (CLARITIN) 10 MG tablet, Take by mouth., Disp: , Rfl:  .  Magnesium 250 MG TABS, Take 250 mg by mouth daily., Disp: , Rfl:  .  methotrexate (RHEUMATREX) 2.5 MG tablet, Take by mouth., Disp: , Rfl:  .  montelukast (SINGULAIR) 10 MG tablet, Take 1 tablet (10 mg total) by mouth at bedtime., Disp: 30 tablet, Rfl: 5 .  Olopatadine HCl (PATADAY) 0.2 % SOLN, Place 1 drop into both eyes 1 day or 1 dose., Disp: 2.5 mL, Rfl: 3 .  tiotropium (SPIRIVA) 18 MCG inhalation capsule, Place 18 mcg into inhaler and inhale daily., Disp: , Rfl:  .  UNABLE TO FIND, Med Name: Liver enzymes, Disp: , Rfl:  .  urea (CARMOL) 40 % CREA, Apply topically daily., Disp: , Rfl:  .  Zinc 10 MG LOZG, Use as directed in the mouth or throat., Disp: , Rfl:   Social History   Tobacco Use  Smoking Status Former Smoker  Smokeless Tobacco Never Used    Allergies  Allergen Reactions  . Latex Itching and Rash   Objective:  There were no vitals filed for this visit. There is no height or weight on file to calculate BMI. Constitutional Well developed. Well nourished.  Vascular Dorsalis pedis pulses palpable bilaterally. Posterior tibial pulses palpable bilaterally. Capillary refill  normal to all digits.  No cyanosis or clubbing noted. Pedal hair growth normal.  Neurologic Normal speech. Oriented to person, place, and time. Epicritic sensation to light touch grossly present bilaterally.  Dermatologic  hyperkeratotic lesion noted submet 5 bilaterally.  Upon debridement no pinpoint bleeding noted.  Pain on palpation to the lesion with range of motion of the fifth metatarsal phalangeal joint.  Orthopedic: Normal joint ROM without pain or crepitus bilaterally. No visible deformities. No bony tenderness.   Radiographs: None Assessment:   1. Porokeratosis   2. Capsulitis of foot, right   3. Capsulitis of foot, left    Plan:  Patient was evaluated and treated and all questions  answered.  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  I gave her various treatment options and I believe patient will benefit from decreasing acute inflammation with steroid injection followed by aggressive debridement of the porokeratosis.  No follow-ups on file.

## 2019-06-15 ENCOUNTER — Other Ambulatory Visit: Payer: Self-pay | Admitting: Orthopedic Surgery

## 2019-06-26 ENCOUNTER — Other Ambulatory Visit: Payer: Self-pay | Admitting: Orthopedic Surgery

## 2019-07-11 ENCOUNTER — Ambulatory Visit: Payer: Medicare Other | Attending: Internal Medicine

## 2019-07-11 DIAGNOSIS — Z23 Encounter for immunization: Secondary | ICD-10-CM | POA: Insufficient documentation

## 2019-07-11 NOTE — Progress Notes (Signed)
   Covid-19 Vaccination Clinic  Name:  [name] [name]    MRN: [id number] DOB: [birth year]  07/11/2019  [name] was observed post Covid-19 immunization for 15 minutes without incidence. She was provided with Vaccine Information Sheet and instruction to access the V-Safe system.   [name] was instructed to call 911 with any severe reactions post vaccine: Marland Kitchen Difficulty breathing  . Swelling of your face and throat  . A fast heartbeat  . A bad rash all over your body  . Dizziness and weakness    Immunizations Administered    Name Date Dose VIS Date Route   Pfizer COVID-19 Vaccine 07/11/2019  3:03 PM 0.3 mL 05/29/2019 Intramuscular   Manufacturer: [place]   Lot: [id number]   [place]: [id number]

## 2019-07-24 ENCOUNTER — Ambulatory Visit: Payer: Self-pay

## 2019-07-31 ENCOUNTER — Ambulatory Visit (INDEPENDENT_AMBULATORY_CARE_PROVIDER_SITE_OTHER): Payer: Medicare Other | Admitting: Podiatry

## 2019-07-31 ENCOUNTER — Encounter: Payer: Self-pay | Admitting: Podiatry

## 2019-07-31 ENCOUNTER — Other Ambulatory Visit: Payer: Self-pay

## 2019-07-31 DIAGNOSIS — M778 Other enthesopathies, not elsewhere classified: Secondary | ICD-10-CM

## 2019-07-31 DIAGNOSIS — B351 Tinea unguium: Secondary | ICD-10-CM

## 2019-07-31 NOTE — Progress Notes (Signed)
Subjective:  Patient ID: [name] [name], female    DOB: 1945/09/23,  MRN: [id number]  Chief Complaint  Patient presents with  . Callouses    pt is here for bil callous trim, pt states that pain is elevated when she is walking on it.    74 y.o. female presents with the above complaint.  Patient presents with a follow-up of bilateral submetatarsal 5 porokeratosis.  After the injection I gave her to the bilateral submetatarsal porokeratosis/capsulitis she had immediate relief for next 2 months.  She states the left side has started coming back again and would like further treatment/injection to keep it controlled.  She also has secondary complaint of painful elongated thickened mycotic toenails x3.  She states that she is interested in debridement of these toenails because it is causing her some pain.  She will also like to know if there is any treatment options associated with the toenails.  She denies any other acute complaints.  Review of Systems: Negative except as noted in the HPI. Denies N/V/F/Ch.  Past Medical History:  Diagnosis Date  . Asthma     Current Outpatient Medications:  .  albuterol (VENTOLIN HFA) 108 (90 Base) MCG/ACT inhaler, Inhale 1-2 puffs into the lungs every 4 (four) hours as needed for wheezing or shortness of breath., Disp: 18 g, Rfl: 1 .  azelastine (ASTELIN) 0.1 % nasal spray, Place 1-2 sprays into both nostrils 2 (two) times daily as needed for rhinitis., Disp: 30 mL, Rfl: 5 .  b complex vitamins tablet, Take 1 tablet by mouth daily., Disp: , Rfl:  .  BENFOTIAMINE PO, Take by mouth., Disp: , Rfl:  .  Betamethasone Sodium Phosphate 6 MG/ML SOLN, inject 2:4 into left hip jointinject 2:4 into bilateral knee joint (2), Disp: , Rfl:  .  budesonide-formoterol (SYMBICORT) 160-4.5 MCG/ACT inhaler, Inhale 2 puffs into the lungs 2 (two) times daily., Disp: 1 Inhaler, Rfl: 5 .  Certolizumab Pegol (CIMZIA [place]), Inject into the skin., Disp: , Rfl:  .  Cholecalciferol  (VITAMIN D3) 25 MCG (1000 UT) CAPS, Take by mouth., Disp: , Rfl:  .  folic acid (FOLVITE) 1 MG tablet, TAKE 1 TABLET BY MOUTH DAILY, Disp: , Rfl:  .  gabapentin (NEURONTIN) 800 MG tablet, TK 1 T PO EACH NIGHT, Disp: , Rfl:  .  levothyroxine (SYNTHROID) 112 MCG tablet, Take by mouth., Disp: , Rfl:  .  loratadine (CLARITIN) 10 MG tablet, Take by mouth., Disp: , Rfl:  .  Magnesium 250 MG TABS, Take 250 mg by mouth daily., Disp: , Rfl:  .  methotrexate (RHEUMATREX) 2.5 MG tablet, Take by mouth., Disp: , Rfl:  .  montelukast (SINGULAIR) 10 MG tablet, Take 1 tablet (10 mg total) by mouth at bedtime., Disp: 30 tablet, Rfl: 5 .  Olopatadine HCl (PATADAY) 0.2 % SOLN, Place 1 drop into both eyes 1 day or 1 dose., Disp: 2.5 mL, Rfl: 3 .  tiotropium (SPIRIVA) 18 MCG inhalation capsule, Place 18 mcg into inhaler and inhale daily., Disp: , Rfl:  .  UNABLE TO FIND, Med Name: Liver enzymes, Disp: , Rfl:  .  urea (CARMOL) 40 % CREA, Apply topically daily., Disp: , Rfl:  .  Zinc 10 MG LOZG, Use as directed in the mouth or throat., Disp: , Rfl:   Social History   Tobacco Use  Smoking Status Former Smoker  Smokeless Tobacco Never Used    Allergies  Allergen Reactions  . Latex Itching and Rash   Objective:  There were no vitals filed for this visit. There is no height or weight on file to calculate BMI. Constitutional Well developed. Well nourished.  Vascular Dorsalis pedis pulses palpable bilaterally. Posterior tibial pulses palpable bilaterally. Capillary refill normal to all digits.  No cyanosis or clubbing noted. Pedal hair growth normal.  Neurologic Normal speech. Oriented to person, place, and time. Epicritic sensation to light touch grossly present bilaterally.  Dermatologic  hyperkeratotic lesion noted submet 5 bilaterally.  Upon debridement no pinpoint bleeding noted.  Pain on palpation to the lesion with range of motion of the fifth metatarsal phalangeal joint on the left only.  The right  side has completely resolved no pain on palpation  Orthopedic: Normal joint ROM without pain or crepitus bilaterally. No visible deformities. No bony tenderness.   Radiographs: None Assessment:   No diagnosis found. Plan:  Patient was evaluated and treated and all questions answered.  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  I gave her various treatment options and I believe patient will benefit from decreasing acute inflammation with steroid injection followed by aggressive debridement of the porokeratosis. A steroid injection was performed at left 5th metatarsophalangeal joint using 1% plain Lidocaine and 10 mg of Kenalog. This was well tolerated.    Onychomycosis of bilateral hallux x2 -Educated the patient on the etiology of onychomycosis and various treatment options associated with improving the fungal load.  I explained to the patient that there is 3 treatment options available to treat the onychomycosis including topical, p.o., laser treatment.  Patient elected to hold off therapy for now we will consider laser therapy during next visit.  No follow-ups on file.

## 2019-08-02 ENCOUNTER — Ambulatory Visit: Payer: Medicare Other | Attending: Internal Medicine

## 2019-08-02 DIAGNOSIS — Z23 Encounter for immunization: Secondary | ICD-10-CM

## 2019-08-02 NOTE — Progress Notes (Signed)
   Covid-19 Vaccination Clinic  Name:  [name] [name]    MRN: [id number] DOB: May 04, 1946  08/02/2019  Ms. Reading was observed post Covid-19 immunization for 15 minutes without incidence. She was provided with Vaccine Information Sheet and instruction to access the V-Safe system.   [name] was instructed to call 911 with any severe reactions post vaccine: Marland Kitchen Difficulty breathing  . Swelling of your face and throat  . A fast heartbeat  . A bad rash all over your body  . Dizziness and weakness    Immunizations Administered    Name Date Dose VIS Date Route   Pfizer COVID-19 Vaccine 08/02/2019 12:20 PM 0.3 mL 05/29/2019 Intramuscular   Manufacturer: [place]   Lot: [id number]   [place]: [id number]

## 2019-08-03 ENCOUNTER — Ambulatory Visit (HOSPITAL_BASED_OUTPATIENT_CLINIC_OR_DEPARTMENT_OTHER): Admit: 2019-08-03 | Payer: Self-pay | Admitting: Orthopedic Surgery

## 2019-08-03 ENCOUNTER — Encounter (HOSPITAL_BASED_OUTPATIENT_CLINIC_OR_DEPARTMENT_OTHER): Payer: Self-pay

## 2019-08-03 SURGERY — ARTHROPLASTY, SHOULDER, TOTAL
Anesthesia: Choice | Site: Shoulder | Laterality: Right

## 2019-08-31 ENCOUNTER — Other Ambulatory Visit: Payer: Self-pay

## 2019-08-31 ENCOUNTER — Ambulatory Visit (INDEPENDENT_AMBULATORY_CARE_PROVIDER_SITE_OTHER): Payer: Medicare Other | Admitting: Podiatry

## 2019-08-31 DIAGNOSIS — Q828 Other specified congenital malformations of skin: Secondary | ICD-10-CM | POA: Diagnosis not present

## 2019-08-31 DIAGNOSIS — M21622 Bunionette of left foot: Secondary | ICD-10-CM | POA: Diagnosis not present

## 2019-09-01 ENCOUNTER — Encounter: Payer: Self-pay | Admitting: Podiatry

## 2019-09-01 NOTE — Progress Notes (Addendum)
Subjective:  Patient ID: [name] [name], female    DOB: 03/24/1946,  MRN: [id number]  Chief Complaint  Patient presents with  . Foot Pain    pt is here for a f/u on calluses of both feet, pt states that pain is elevated due to her walking    74 y.o. female presents with the above complaint.  Patient presents with follow-up of bilateral submetatarsal left greater than right porokeratosis/hyperkeratotic lesion.  Patient states the pain has been elevated when walking on it.  The injections are not helping as frequently as he used to.  Patient also wants to talk about getting more comfortable socks which I agree with the patient if those will help she will try those out.  Her left side is greater than her right side.  The right side is much more manageable.  At this point patient has failed multiple conservative therapy and may consider surgical intervention.  Review of Systems: Negative except as noted in the HPI. Denies N/V/F/Ch.  Past Medical History:  Diagnosis Date  . Asthma     Current Outpatient Medications:  .  albuterol (VENTOLIN HFA) 108 (90 Base) MCG/ACT inhaler, Inhale 1-2 puffs into the lungs every 4 (four) hours as needed for wheezing or shortness of breath., Disp: 18 g, Rfl: 1 .  azelastine (ASTELIN) 0.1 % nasal spray, Place 1-2 sprays into both nostrils 2 (two) times daily as needed for rhinitis., Disp: 30 mL, Rfl: 5 .  b complex vitamins tablet, Take 1 tablet by mouth daily., Disp: , Rfl:  .  BENFOTIAMINE PO, Take by mouth., Disp: , Rfl:  .  Betamethasone Sodium Phosphate 6 MG/ML SOLN, inject 2:4 into left hip jointinject 2:4 into bilateral knee joint (2), Disp: , Rfl:  .  budesonide-formoterol (SYMBICORT) 160-4.5 MCG/ACT inhaler, Inhale 2 puffs into the lungs 2 (two) times daily., Disp: 1 Inhaler, Rfl: 5 .  Certolizumab Pegol (CIMZIA [place]), Inject into the skin., Disp: , Rfl:  .  Cholecalciferol (VITAMIN D3) 25 MCG (1000 UT) CAPS, Take by mouth., Disp: , Rfl:  .  folic  acid (FOLVITE) 1 MG tablet, TAKE 1 TABLET BY MOUTH DAILY, Disp: , Rfl:  .  gabapentin (NEURONTIN) 800 MG tablet, TK 1 T PO EACH NIGHT, Disp: , Rfl:  .  levothyroxine (SYNTHROID) 112 MCG tablet, Take by mouth., Disp: , Rfl:  .  loratadine (CLARITIN) 10 MG tablet, Take by mouth., Disp: , Rfl:  .  Magnesium 250 MG TABS, Take 250 mg by mouth daily., Disp: , Rfl:  .  methotrexate (RHEUMATREX) 2.5 MG tablet, Take by mouth., Disp: , Rfl:  .  montelukast (SINGULAIR) 10 MG tablet, Take 1 tablet (10 mg total) by mouth at bedtime., Disp: 30 tablet, Rfl: 5 .  Olopatadine HCl (PATADAY) 0.2 % SOLN, Place 1 drop into both eyes 1 day or 1 dose., Disp: 2.5 mL, Rfl: 3 .  tiotropium (SPIRIVA) 18 MCG inhalation capsule, Place 18 mcg into inhaler and inhale daily., Disp: , Rfl:  .  UNABLE TO FIND, Med Name: Liver enzymes, Disp: , Rfl:  .  urea (CARMOL) 40 % CREA, Apply topically daily., Disp: , Rfl:  .  Zinc 10 MG LOZG, Use as directed in the mouth or throat., Disp: , Rfl:   Social History   Tobacco Use  Smoking Status Former Smoker  Smokeless Tobacco Never Used    Allergies  Allergen Reactions  . Latex Itching and Rash   Objective:  There were no vitals filed for this  visit. There is no height or weight on file to calculate BMI. Constitutional Well developed. Well nourished.  Vascular Dorsalis pedis pulses palpable bilaterally. Posterior tibial pulses palpable bilaterally. Capillary refill normal to all digits.  No cyanosis or clubbing noted. Pedal hair growth normal.  Neurologic Normal speech. Oriented to person, place, and time. Epicritic sensation to light touch grossly present bilaterally.  Dermatologic  hyperkeratotic lesion noted submet 5 bilaterally.  Upon debridement no pinpoint bleeding noted.  Pain on palpation to the submetatarsal 5 lesion as well as lateral tailors bunion head of the fifth metatarsal.  No pain with range of motion or intra-articular pain of the fifth digit.  Mild  derotation of the fifth digit noted as well.  Orthopedic: Normal joint ROM without pain or crepitus bilaterally. No visible deformities. No bony tenderness.   Radiographs: None Assessment:   1. Tailor's bunion of left foot   2. Porokeratosis    Plan:  Patient was evaluated and treated and all questions answered.  Bilateral fifth metatarsophalangeal joint capsulitis/porokeratosis secondary to tailor's bunion -I extensively discussed with the patient the etiology of the pain that she is having with underlying tailor's bunion.  Unfortunately we have not been able to decrease the pain with injection offloading changes in shoe gear modification.  I discussed with the patient that she may need surgical intervention to help decrease the pressure associated from ambulating and putting pressure to the fifth metatarsal head.  Patient would like to think about the surgical intervention will discuss it after her trip in April. -I will discuss tailor's bunion correction during the next visit. -Metatarsal pads were dispensed and see if this will help decrease some of the pain from pressure.  Patient will give those a try.    Onychomycosis of bilateral hallux x2 -Educated the patient on the etiology of onychomycosis and various treatment options associated with improving the fungal load.  I explained to the patient that there is 3 treatment options available to treat the onychomycosis including topical, p.o., laser treatment.  Patient elected to hold off therapy for now we will consider laser therapy during next visit.  No follow-ups on file.

## 2019-09-21 ENCOUNTER — Ambulatory Visit: Payer: Medicare Other | Admitting: Allergy and Immunology

## 2019-10-26 ENCOUNTER — Other Ambulatory Visit: Payer: Self-pay

## 2019-10-26 ENCOUNTER — Encounter: Payer: Self-pay | Admitting: Allergy and Immunology

## 2019-10-26 ENCOUNTER — Ambulatory Visit (INDEPENDENT_AMBULATORY_CARE_PROVIDER_SITE_OTHER): Payer: Medicare Other | Admitting: Allergy and Immunology

## 2019-10-26 VITALS — BP 100/66 | HR 75 | Temp 97.9°F | Resp 16 | Ht 67.0 in | Wt 213.0 lb

## 2019-10-26 DIAGNOSIS — J3089 Other allergic rhinitis: Secondary | ICD-10-CM | POA: Diagnosis not present

## 2019-10-26 DIAGNOSIS — H1013 Acute atopic conjunctivitis, bilateral: Secondary | ICD-10-CM | POA: Diagnosis not present

## 2019-10-26 DIAGNOSIS — J4540 Moderate persistent asthma, uncomplicated: Secondary | ICD-10-CM | POA: Diagnosis not present

## 2019-10-26 MED ORDER — FLOVENT HFA 110 MCG/ACT IN AERO: 2.0000 | INHALATION_SPRAY | Freq: Two times a day (BID) | RESPIRATORY_TRACT | 5 refills | Status: DC

## 2019-10-26 MED ORDER — ALBUTEROL SULFATE HFA 108 (90 BASE) MCG/ACT IN AERS: 1.0000 | INHALATION_SPRAY | RESPIRATORY_TRACT | 1 refills | Status: DC | PRN

## 2019-10-26 MED ORDER — AZELASTINE-FLUTICASONE 137-50 MCG/ACT NA SUSP: 1.0000 | Freq: Two times a day (BID) | NASAL | 5 refills | Status: DC | PRN

## 2019-10-26 NOTE — Assessment & Plan Note (Signed)
Well-controlled, we will stepdown therapy at this time.  A prescription has been provided for Flovent (fluticasone) 110 g, 2 inhalations via spacer device twice a day.  If lower respiratory symptoms progress in frequency and/or severity, the patient is to resume Symbicort 160.  For now, continue montelukast 10 mg daily at bedtime and albuterol HFA, 1 to 2 inhalations every 4-6 hours if needed.  Subjective and objective measures of pulmonary function will be followed and the treatment plan will be adjusted accordingly.

## 2019-10-26 NOTE — Progress Notes (Signed)
Follow-up Note  RE: [name] [name] MRN: [id number] DOB: 23-Oct-1945 Date of Office Visit: 10/26/2019  Primary care provider: Haywood Pao, MD Referring provider: Haywood Pao, MD  History of present illness: [name] [name] is a 74 y.o. female with persistent asthma and allergic rhinitis presenting today for follow-up.  She was last seen in this clinic in December 2020.  She reports that in the interval since her previous visit she has not required asthma rescue medication and has not experienced limitations of normal daily activities or nocturnal awakenings due to lower respiratory symptoms.  She is currently taking Symbicort 160-4.5 g, 2 elations via spacer device twice daily, and montelukast 10 mg daily at bedtime. She reports that despite compliance with azelastine nasal spray and nasal saline spray, she is still experiencing frequent rhinorrhea. She has a history of dry eyes and was started 2 or 3 days ago on Restasis.  She has been unable to assess benefit at this point.  Assessment and plan: Moderate persistent asthma Well-controlled, we will stepdown therapy at this time.  A prescription has been provided for Flovent (fluticasone) 110 g, 2 inhalations via spacer device twice a day.  If lower respiratory symptoms progress in frequency and/or severity, the patient is to resume Symbicort 160.  For now, continue montelukast 10 mg daily at bedtime and albuterol HFA, 1 to 2 inhalations every 4-6 hours if needed.  Subjective and objective measures of pulmonary function will be followed and the treatment plan will be adjusted accordingly.  Perennial and seasonal allergic rhinitis Currently with suboptimal control.  A prescription has been provided for azelastine/fluticasone nasal spray, 1 spray per nostril twice daily as needed. Proper nasal spray technique has been discussed and demonstrated.  Nasal saline spray (i.e., Simply Saline) or nasal saline lavage  (i.e., NeilMed) is recommended as needed and prior to medicated nasal sprays.  Continue appropriate allergen avoidance measures and montelukast 10 mg daily.  If allergen avoidance measures and medications fail to adequately relieve symptoms, aeroallergen immunotherapy will be considered.  Allergic conjunctivitis Treatment plan as outlined above for allergic rhinitis.  As it is difficult to tell if the patient's ocular symptoms are due to dry eye or allergic conjunctivitis, she will try Restasis by itself for a period of time.  If ocular pruritus persists, she will add olopatadine ophthalmic drops.   Meds ordered this encounter  Medications  . albuterol (VENTOLIN HFA) 108 (90 Base) MCG/ACT inhaler    Sig: Inhale 1-2 puffs into the lungs every 4 (four) hours as needed for wheezing or shortness of breath.    Dispense:  18 g    Refill:  1  . fluticasone (FLOVENT HFA) 110 MCG/ACT inhaler    Sig: Inhale 2 puffs into the lungs 2 (two) times daily.    Dispense:  1 Inhaler    Refill:  5  . Azelastine-Fluticasone 137-50 MCG/ACT SUSP    Sig: Place 1 spray into the nose 2 (two) times daily as needed.    Dispense:  23 g    Refill:  5    Diagnostics: Spirometry reveals an FVC of 2.57 L and an FEV1 of 1.89 L with an FEV1 ratio of 99%.  Please see scanned spirometry results for details.    Physical examination: Blood pressure 100/66, pulse 75, temperature 97.9 F (36.6 C), temperature source Temporal, resp. rate 16, height 5\' 7"  (1.702 m), weight 213 lb (96.6 kg), SpO2 96 %.  General: Alert, interactive, in no acute distress.  HEENT: TMs pearly gray, turbinates mildly edematous with clear discharge, post-pharynx unremarkable. Neck: Supple without lymphadenopathy. Lungs: Clear to auscultation without wheezing, rhonchi or rales. CV: Normal S1, S2 without murmurs. Skin: Warm and dry, without lesions or rashes.  The following portions of the patient's history were reviewed and updated as  appropriate: allergies, current medications, past family history, past medical history, past social history, past surgical history and problem list.  Current Outpatient Medications  Medication Sig Dispense Refill  . albuterol (VENTOLIN HFA) 108 (90 Base) MCG/ACT inhaler Inhale 1-2 puffs into the lungs every 4 (four) hours as needed for wheezing or shortness of breath. 18 g 1  . azelastine (ASTELIN) 0.1 % nasal spray Place 1-2 sprays into both nostrils 2 (two) times daily as needed for rhinitis. 30 mL 5  . b complex vitamins tablet Take 1 tablet by mouth daily.    . BENFOTIAMINE PO Take by mouth.    . Betamethasone Sodium Phosphate 6 MG/ML SOLN inject 2:4 into left hip jointinject 2:4 into bilateral knee joint (2)    . Certolizumab Pegol (CIMZIA [place]) Inject into the skin.    . Cholecalciferol (VITAMIN D3) 25 MCG (1000 UT) CAPS Take by mouth.    . cycloSPORINE (RESTASIS) 0.05 % ophthalmic emulsion 1 drop 2 (two) times daily.    . fexofenadine (ALLEGRA) 180 MG tablet Take 180 mg by mouth daily.    . folic acid (FOLVITE) 1 MG tablet TAKE 1 TABLET BY MOUTH DAILY    . gabapentin (NEURONTIN) 800 MG tablet TK 1 T PO EACH NIGHT    . levothyroxine (SYNTHROID) 112 MCG tablet Take by mouth.    . loratadine (CLARITIN) 10 MG tablet Take by mouth.    . Magnesium 250 MG TABS Take 250 mg by mouth daily.    . methotrexate (RHEUMATREX) 2.5 MG tablet Take by mouth.    . montelukast (SINGULAIR) 10 MG tablet Take 1 tablet (10 mg total) by mouth at bedtime. 30 tablet 5  . Olopatadine HCl (PATADAY) 0.2 % SOLN Place 1 drop into both eyes 1 day or 1 dose. 2.5 mL 3  . UNABLE TO FIND Med Name: Liver enzymes    . urea (CARMOL) 40 % CREA Apply topically daily.    . Zinc 10 MG LOZG Use as directed in the mouth or throat.    . Azelastine-Fluticasone 137-50 MCG/ACT SUSP Place 1 spray into the nose 2 (two) times daily as needed. 23 g 5  . fluticasone (FLOVENT HFA) 110 MCG/ACT inhaler Inhale 2 puffs into the lungs 2 (two)  times daily. 1 Inhaler 5  . tiotropium (SPIRIVA) 18 MCG inhalation capsule Place 18 mcg into inhaler and inhale daily.     No current facility-administered medications for this visit.    Allergies  Allergen Reactions  . Latex Itching and Rash   Review of systems: Review of systems negative except as noted in HPI / PMHx.  Past Medical History:  Diagnosis Date  . Asthma     Family History  Problem Relation Age of Onset  . Asthma Mother   . Allergic rhinitis Mother   . Allergic rhinitis Father   . Eczema Brother   . Allergic rhinitis Brother     Social History   Socioeconomic History  . Marital status: Married    Spouse name: Not on file  . Number of children: Not on file  . Years of education: Not on file  . Highest education level: Not on file  Occupational History  .  Not on file  Tobacco Use  . Smoking status: Former Research scientist (life sciences)  . Smokeless tobacco: Never Used  Substance and Sexual Activity  . Alcohol use: Never  . Drug use: Never  . Sexual activity: Not on file  Other Topics Concern  . Not on file  Social History Narrative  . Not on file   Social Determinants of Health   Financial Resource Strain:   . Difficulty of Paying Living Expenses:   Food Insecurity:   . Worried About Charity fundraiser in the Last Year:   . Arboriculturist in the Last Year:   Transportation Needs:   . Film/video editor (Medical):   Marland Kitchen Lack of Transportation (Non-Medical):   Physical Activity:   . Days of Exercise per Week:   . Minutes of Exercise per Session:   Stress:   . Feeling of Stress :   Social Connections:   . Frequency of Communication with Friends and Family:   . Frequency of Social Gatherings with Friends and Family:   . Attends Religious Services:   . Active Member of Clubs or Organizations:   . Attends Archivist Meetings:   Marland Kitchen Marital Status:   Intimate Partner Violence:   . Fear of Current or Ex-Partner:   . Emotionally Abused:   Marland Kitchen Physically  Abused:   . Sexually Abused:     I appreciate the opportunity to take part in [name] [name]'s care. Please do not hesitate to contact me with questions.  Sincerely,   R. Edgar Frisk, MD

## 2019-10-26 NOTE — Assessment & Plan Note (Signed)
Treatment plan as outlined above for allergic rhinitis.  As it is difficult to tell if the patient's ocular symptoms are due to dry eye or allergic conjunctivitis, she will try Restasis by itself for a period of time.  If ocular pruritus persists, she will add olopatadine ophthalmic drops.

## 2019-10-26 NOTE — Patient Instructions (Addendum)
Moderate persistent asthma Well-controlled, we will stepdown therapy at this time.  A prescription has been provided for Flovent (fluticasone) 110 g, 2 inhalations via spacer device twice a day.  If lower respiratory symptoms progress in frequency and/or severity, the patient is to resume Symbicort 160.  For now, continue montelukast 10 mg daily at bedtime and albuterol HFA, 1 to 2 inhalations every 4-6 hours if needed.  Subjective and objective measures of pulmonary function will be followed and the treatment plan will be adjusted accordingly.  Perennial and seasonal allergic rhinitis Currently with suboptimal control.  A prescription has been provided for azelastine/fluticasone nasal spray, 1 spray per nostril twice daily as needed. Proper nasal spray technique has been discussed and demonstrated.  Nasal saline spray (i.e., Simply Saline) or nasal saline lavage (i.e., NeilMed) is recommended as needed and prior to medicated nasal sprays.  Continue appropriate allergen avoidance measures and montelukast 10 mg daily.  If allergen avoidance measures and medications fail to adequately relieve symptoms, aeroallergen immunotherapy will be considered.  Allergic conjunctivitis Treatment plan as outlined above for allergic rhinitis.  As it is difficult to tell if the patient's ocular symptoms are due to dry eye or allergic conjunctivitis, she will try Restasis by itself for a period of time.  If ocular pruritus persists, she will add olopatadine ophthalmic drops.   Return in about 4 months (around 02/26/2020), or if symptoms worsen or fail to improve.

## 2019-10-26 NOTE — Assessment & Plan Note (Signed)
Currently with suboptimal control.  A prescription has been provided for azelastine/fluticasone nasal spray, 1 spray per nostril twice daily as needed. Proper nasal spray technique has been discussed and demonstrated.  Nasal saline spray (i.e., Simply Saline) or nasal saline lavage (i.e., NeilMed) is recommended as needed and prior to medicated nasal sprays.  Continue appropriate allergen avoidance measures and montelukast 10 mg daily.  If allergen avoidance measures and medications fail to adequately relieve symptoms, aeroallergen immunotherapy will be considered.

## 2019-11-13 ENCOUNTER — Ambulatory Visit: Payer: Medicare Other | Admitting: Podiatry

## 2019-12-04 ENCOUNTER — Ambulatory Visit: Payer: Medicare Other | Admitting: Podiatry

## 2020-01-07 ENCOUNTER — Ambulatory Visit (INDEPENDENT_AMBULATORY_CARE_PROVIDER_SITE_OTHER): Payer: Medicare Other | Admitting: Podiatry

## 2020-01-07 ENCOUNTER — Other Ambulatory Visit: Payer: Self-pay

## 2020-01-07 ENCOUNTER — Encounter: Payer: Self-pay | Admitting: Podiatry

## 2020-01-07 DIAGNOSIS — M21622 Bunionette of left foot: Secondary | ICD-10-CM | POA: Diagnosis not present

## 2020-01-07 DIAGNOSIS — M2041 Other hammer toe(s) (acquired), right foot: Secondary | ICD-10-CM

## 2020-01-07 DIAGNOSIS — G63 Polyneuropathy in diseases classified elsewhere: Secondary | ICD-10-CM

## 2020-01-07 DIAGNOSIS — M7752 Other enthesopathy of left foot: Secondary | ICD-10-CM | POA: Diagnosis not present

## 2020-01-07 DIAGNOSIS — M2042 Other hammer toe(s) (acquired), left foot: Secondary | ICD-10-CM | POA: Diagnosis not present

## 2020-01-07 DIAGNOSIS — M21621 Bunionette of right foot: Secondary | ICD-10-CM

## 2020-01-07 NOTE — Progress Notes (Signed)
  Subjective:  Patient ID: [name] [name], female    DOB: May 06, 1946,  MRN: [id number]  Chief Complaint  Patient presents with  . Callouses    painful callus lesion, 3 month follow up  . Bunions    Tailor's bunion left    74 y.o. female presents with the above complaint. History confirmed with patient.  She has tailor's bunions on both feet, the left is worse than the right.  She is been seeing Dr. Posey Pronto for this as well.  He previously has injected the area with steroid which was somewhat helpful.  She has a vacation coming up with her family and is going to be taken care of the grandkids.  She also has a callus over the areas and a callus on the right 3rd toe  Objective:  Physical Exam: warm, good capillary refill, no trophic changes or ulcerative lesions, normal DP and PT pulses and she does have reduced sensation in the plantar soles of feet and pulps of toes Left Foot: bunionette deformity noted, flexible hammertoes noted, erythema and severe tenderness over the 5th metatarsal head laterally and plantarly with a soft feel over the area.  Subfive callus Right Foot: bunionette deformity noted, semiflexible hammertoes with callus tip of the 3rd toe and subfive callus    Assessment:   1. Bursitis of left foot   2. Hammertoe of left foot   3. Hammertoe of right foot   4. Tailor's bunion of left foot   5. Tailor's bunion of right foot      Plan:  Patient was evaluated and treated and all questions answered.  I discussed with her the etiology and treatment options for tailor's bunion deformities.  She appears to have good shoe gear.  On the left side she has severe tenderness over what I believe is a bursal sac which appears to be quite inflamed.  I recommended that we inject the area with corticosteroid, and she agreed.  After sterile prep with alcohol, 0.5 cc 2% lidocaine plain, 0.5 cc 0.5% Marcaine plain, 2 mg dexamethasone phosphate, 5 mg Kenalog was injected into the bursa  laterally and plantarly about the 5th metatarsophalangeal joint.   Dancers pads were applied to offload the 5th metatarsal area.  We also discussed surgical treatment for these in the future if she is amenable to this which I believe would help her immensely.  Discussed the surgical procedure, risks, benefits, and postoperative course with her.  She will think about this for the future.  Also discussed surgical nonsurgical treatment of hammertoes, the right 3rd toe is quite symptomatic with a distal tip callus for her.  She had a previous flexor tenotomy here by a doctor many years ago but the toe has started to recur.  I dispensed a toe crest to offload the area today.  All symptomatic hyperkeratoses were safely debrided with a sterile #15 blade to patient's level of comfort without incident. We discussed preventative and palliative care of these lesions including supportive and accommodative shoegear, padding, prefabricated and custom molded accommodative orthoses, use of a pumice stone and lotions/creams daily.    Return in about 6 weeks (around 02/18/2020) for re-check hammer toes and tailor's bunion, calluses.

## 2020-01-29 ENCOUNTER — Ambulatory Visit: Payer: Medicare Other | Admitting: Podiatry

## 2020-02-18 ENCOUNTER — Ambulatory Visit: Payer: Medicare Other | Admitting: Podiatry

## 2020-02-24 ENCOUNTER — Other Ambulatory Visit: Payer: Self-pay

## 2020-02-24 ENCOUNTER — Other Ambulatory Visit: Payer: Self-pay | Admitting: Allergy and Immunology

## 2020-02-24 MED ORDER — MONTELUKAST SODIUM 10 MG PO TABS: 10.0000 mg | ORAL_TABLET | Freq: Every day | ORAL | 0 refills | Status: DC

## 2020-02-26 ENCOUNTER — Other Ambulatory Visit: Payer: Self-pay

## 2020-02-26 DIAGNOSIS — M25551 Pain in right hip: Secondary | ICD-10-CM

## 2020-02-26 DIAGNOSIS — M545 Low back pain, unspecified: Secondary | ICD-10-CM

## 2020-02-29 ENCOUNTER — Ambulatory Visit: Payer: Medicare Other | Admitting: Allergy and Immunology

## 2020-02-29 ENCOUNTER — Encounter: Payer: Self-pay | Admitting: Allergy

## 2020-02-29 ENCOUNTER — Ambulatory Visit (INDEPENDENT_AMBULATORY_CARE_PROVIDER_SITE_OTHER): Payer: Medicare Other | Admitting: Allergy

## 2020-02-29 ENCOUNTER — Other Ambulatory Visit: Payer: Self-pay

## 2020-02-29 VITALS — BP 112/64 | HR 76 | Temp 97.9°F | Resp 16 | Ht 68.0 in | Wt 225.8 lb

## 2020-02-29 DIAGNOSIS — J3089 Other allergic rhinitis: Secondary | ICD-10-CM | POA: Diagnosis not present

## 2020-02-29 DIAGNOSIS — J4540 Moderate persistent asthma, uncomplicated: Secondary | ICD-10-CM | POA: Diagnosis not present

## 2020-02-29 DIAGNOSIS — J302 Other seasonal allergic rhinitis: Secondary | ICD-10-CM | POA: Diagnosis not present

## 2020-02-29 DIAGNOSIS — H1010 Acute atopic conjunctivitis, unspecified eye: Secondary | ICD-10-CM

## 2020-02-29 MED ORDER — OLOPATADINE HCL 0.2 % OP SOLN: 1.0000 [drp] | Freq: Every day | OPHTHALMIC | 5 refills | Status: DC | PRN

## 2020-02-29 MED ORDER — BUDESONIDE-FORMOTEROL FUMARATE 80-4.5 MCG/ACT IN AERO: 2.0000 | INHALATION_SPRAY | Freq: Two times a day (BID) | RESPIRATORY_TRACT | 5 refills | Status: DC

## 2020-02-29 NOTE — Assessment & Plan Note (Signed)
Fall and spring are the worst seasons. Was on allergy injections for 2 years but stopped due to large localized reactions.  May use over the counter antihistamines such as Zyrtec (cetirizine), Claritin (loratadine), Allegra (fexofenadine), or Xyzal (levocetirizine) daily as needed. May take twice a day if needed.   May use olopatadine eye drops 0.2% once a day as needed for itchy/watery eyes. Continue dymista (fluticasone + azelastine nasal spray combination) 1 spray per nostril twice a day. Nasal saline spray (i.e., Simply Saline) or nasal saline lavage (i.e., NeilMed) is recommended as needed and prior to medicated nasal sprays. If not controlled with above regimen then consider re-testing and starting allergy injections.

## 2020-02-29 NOTE — Progress Notes (Signed)
Follow Up Note  RE: [name] [name] MRN: [id number] DOB: 1946-01-23 Date of Office Visit: 02/29/2020  Referring provider: Haywood Pao, MD Primary care provider: Haywood Pao, MD  Chief Complaint: Asthma (No concerns at this time)  History of Present Illness: I had the pleasure of seeing [name] [name] for a follow up visit at the [facility] [place] on 02/29/2020. She is a 74 y.o. female, who is being followed for asthma and allergic rhino conjunctivitis. Her previous allergy office visit was on 10/26/2019 with Dr. Verlin Fester. Today is a regular follow up visit. Up to date with COVID-19 vaccine: yes  Moderate persistent asthma ACT score 21. Patient was switched from Symbicort to Flovent and noted some chest tightness at the end of the day so now she is using Flovent 19mcg 2 puffs in AM and Symbicort 162mcg 2 puffs in PM which helped. Takes Singulair 10mg  daily.   Denies any ER/urgent care visits or prednisone use since the last visit.  Allergic rhino conjunctivitis Fall and spring are the worse seasons. This will be her first fall since moving from [place] to [place].   Uses alaway or Naphcon eye drops as needed with good benefit.  Currently on dymista 2 sprays twice a day. No nosebleeds.   Takes Claritin or allegra in the morning.   Patient was on allergy injections for 2 years but had large localized reactions.   Assessment and Plan: [name] [name] is a 74 y.o. female with: Moderate persistent asthma without complication Was not well controlled with Flovent 135mcg 2 puffs BID and now doing Flovent 187mcg 2 puffs in AM and Symbicort 1109mcg 2 puffs in PM.  Act score 21.   Today's spirometry showed some restriction. . Check what dose of Symbicort you have at home.  . Daily controller medication(s): start Symbicort 26mcg 2 puffs twice a day with spacer and rinse mouth afterwards. o Stop Flovent inhaler.  o Continue Singulair 10mg  daily. . During upper  respiratory infections/asthma flares: start Symbicort 129mcg 2 puffs twice a day for 1-2 weeks until your breathing symptoms return to baseline.  . May use albuterol rescue inhaler 2 puffs every 4 to 6 hours as needed for shortness of breath, chest tightness, coughing, and wheezing. May use albuterol rescue inhaler 2 puffs 5 to 15 minutes prior to strenuous physical activities. Monitor frequency of use.  . Repeat spirometry at next visit.   Seasonal and perennial allergic rhinoconjunctivitis Fall and spring are the worst seasons. Was on allergy injections for 2 years but stopped due to large localized reactions.  May use over the counter antihistamines such as Zyrtec (cetirizine), Claritin (loratadine), Allegra (fexofenadine), or Xyzal (levocetirizine) daily as needed. May take twice a day if needed.   May use olopatadine eye drops 0.2% once a day as needed for itchy/watery eyes. Continue dymista (fluticasone + azelastine nasal spray combination) 1 spray per nostril twice a day. Nasal saline spray (i.e., Simply Saline) or nasal saline lavage (i.e., NeilMed) is recommended as needed and prior to medicated nasal sprays. If not controlled with above regimen then consider re-testing and starting allergy injections.   Return in about 4 months (around 06/30/2020).  Meds ordered this encounter  Medications  . budesonide-formoterol (SYMBICORT) 80-4.5 MCG/ACT inhaler    Sig: Inhale 2 puffs into the lungs 2 (two) times daily.    Dispense:  1 each    Refill:  5  . Olopatadine HCl 0.2 % SOLN    Sig: Apply 1 drop  to eye daily as needed (itchy/watery eyes).    Dispense:  2.5 mL    Refill:  5   Diagnostics: Spirometry:  Tracings reviewed. Her effort: Good reproducible efforts. FVC: 2.71L FEV1 1.91L, 73% predicted FEV1/FVC ratio: 70% Interpretation: Spirometry consistent with possible restrictive disease.  Please see scanned spirometry results for details.  Medication List:  Current Outpatient  Medications  Medication Sig Dispense Refill  . albuterol (VENTOLIN HFA) 108 (90 Base) MCG/ACT inhaler Inhale 1-2 puffs into the lungs every 4 (four) hours as needed for wheezing or shortness of breath. 18 g 1  . b complex vitamins tablet Take 1 tablet by mouth daily.    . BENFOTIAMINE PO Take by mouth.    . Betamethasone Sodium Phosphate 6 MG/ML SOLN inject 2:4 into left hip jointinject 2:4 into bilateral knee joint (2)    . Certolizumab Pegol (CIMZIA [place]) Inject into the skin.    . Cholecalciferol (VITAMIN D3) 25 MCG (1000 UT) CAPS Take by mouth.    . folic acid (FOLVITE) 1 MG tablet TAKE 1 TABLET BY MOUTH DAILY    . gabapentin (NEURONTIN) 800 MG tablet TK 1 T PO EACH NIGHT    . levothyroxine (SYNTHROID) 112 MCG tablet Take by mouth.    . loratadine (CLARITIN) 10 MG tablet Take by mouth.    . Magnesium 250 MG TABS Take 250 mg by mouth daily.    . montelukast (SINGULAIR) 10 MG tablet TAKE 1 TABLET(10 MG) BY MOUTH AT BEDTIME 90 tablet 0  . traMADol (ULTRAM) 50 MG tablet Take 50 mg by mouth daily as needed. As Needed    . triamcinolone cream (KENALOG) 0.1 % Apply topically.    . urea (CARMOL) 40 % CREA Apply topically daily.    . Zinc 10 MG LOZG Use as directed in the mouth or throat.    . Azelastine-Fluticasone 137-50 MCG/ACT SUSP Place 1 spray into the nose 2 (two) times daily as needed. (Patient not taking: Reported on 02/29/2020) 23 g 5  . bimatoprost (LATISSE) 0.03 % ophthalmic solution APPLY ONE DROP TO APPLICATOR AND LINE IT TO THE UPPER LASH ONLY AS DIRECTED (Patient not taking: Reported on 02/29/2020)    . budesonide-formoterol (SYMBICORT) 80-4.5 MCG/ACT inhaler Inhale 2 puffs into the lungs 2 (two) times daily. 1 each 5  . cycloSPORINE (RESTASIS) 0.05 % ophthalmic emulsion 1 drop 2 (two) times daily. (Patient not taking: Reported on 02/29/2020)    . fexofenadine (ALLEGRA) 180 MG tablet Take 180 mg by mouth daily. (Patient not taking: Reported on 02/29/2020)    . methotrexate (RHEUMATREX)  2.5 MG tablet Take by mouth. (Patient not taking: Reported on 02/29/2020)    . Olopatadine HCl 0.2 % SOLN Apply 1 drop to eye daily as needed (itchy/watery eyes). 2.5 mL 5  . UNABLE TO FIND Med Name: Liver enzymes     No current facility-administered medications for this visit.   Allergies: Allergies  Allergen Reactions  . Latex Itching and Rash   I reviewed her past medical history, social history, family history, and environmental history and no significant changes have been reported from her previous visit.  Review of Systems  Constitutional: Negative for appetite change, chills, fever and unexpected weight change.  HENT: Negative for congestion and rhinorrhea.   Eyes: Negative for itching.  Respiratory: Negative for cough, chest tightness, shortness of breath and wheezing.   Gastrointestinal: Negative for abdominal pain.  Skin: Negative for rash.  Allergic/Immunologic: Positive for environmental allergies.  Neurological: Negative for headaches.  Objective: BP 112/64   Pulse 76   Temp 97.9 F (36.6 C)   Resp 16   Ht 5\' 8"  (1.727 m)   Wt 225 lb 12.8 oz (102.4 kg)   SpO2 97%   BMI 34.33 kg/m  Body mass index is 34.33 kg/m. Physical Exam Vitals and nursing note reviewed.  Constitutional:      Appearance: Normal appearance. She is well-developed.  HENT:     Head: Normocephalic and atraumatic.     Right Ear: Tympanic membrane and external ear normal.     Left Ear: Tympanic membrane and external ear normal.     Nose: Nose normal.     Mouth/Throat:     Mouth: Mucous membranes are moist.     Pharynx: Oropharynx is clear.  Eyes:     Conjunctiva/sclera: Conjunctivae normal.  Cardiovascular:     Rate and Rhythm: Normal rate and regular rhythm.     Heart sounds: Normal heart sounds. No murmur heard.   Pulmonary:     Effort: Pulmonary effort is normal.     Breath sounds: Normal breath sounds. No wheezing, rhonchi or rales.  Musculoskeletal:     Cervical back: Neck  supple.  Skin:    General: Skin is warm.     Findings: No rash.  Neurological:     Mental Status: She is alert and oriented to person, place, and time.  Psychiatric:        Behavior: Behavior normal.    Previous notes and tests were reviewed. The plan was reviewed with the patient/family, and all questions/concerned were addressed.  It was my pleasure to see [name] [name] today and participate in her care. Please feel free to contact me with any questions or concerns.  Sincerely,  Rexene Alberts, DO Allergy & Immunology  [facility] office: [phone] [facility] office: [place] office: [phone]

## 2020-02-29 NOTE — Assessment & Plan Note (Addendum)
Was not well controlled with Flovent 119mcg 2 puffs BID and now doing Flovent 111mcg 2 puffs in AM and Symbicort 149mcg 2 puffs in PM.  Act score 21.   Today's spirometry showed some restriction. . Check what dose of Symbicort you have at home.  . Daily controller medication(s): start Symbicort 41mcg 2 puffs twice a day with spacer and rinse mouth afterwards. o Stop Flovent inhaler.  o Continue Singulair 10mg  daily. . During upper respiratory infections/asthma flares: start Symbicort 163mcg 2 puffs twice a day for 1-2 weeks until your breathing symptoms return to baseline.  . May use albuterol rescue inhaler 2 puffs every 4 to 6 hours as needed for shortness of breath, chest tightness, coughing, and wheezing. May use albuterol rescue inhaler 2 puffs 5 to 15 minutes prior to strenuous physical activities. Monitor frequency of use.  . Repeat spirometry at next visit.

## 2020-02-29 NOTE — Patient Instructions (Addendum)
Asthma:  . Check what dose of Symbicort you have at home.  . Daily controller medication(s): start Symbicort 52mcg 2 puffs twice a day with spacer and rinse mouth afterwards. o Stop Flovent inhaler.   Continue Singulair 10mg  daily. . During upper respiratory infections/asthma flares: start Symbicort 179mcg 2 puffs twice a day for 1-2 weeks until your breathing symptoms return to baseline.  . May use albuterol rescue inhaler 2 puffs every 4 to 6 hours as needed for shortness of breath, chest tightness, coughing, and wheezing. May use albuterol rescue inhaler 2 puffs 5 to 15 minutes prior to strenuous physical activities. Monitor frequency of use.  . Asthma control goals:  o Full participation in all desired activities (may need albuterol before activity) o Albuterol use two times or less a week on average (not counting use with activity) o Cough interfering with sleep two times or less a month o Oral steroids no more than once a year o No hospitalizations  Environmental allergies:  May use over the counter antihistamines such as Zyrtec (cetirizine), Claritin (loratadine), Allegra (fexofenadine), or Xyzal (levocetirizine) daily as needed. May take twice a day if needed.   May use olopatadine eye drops 0.2% once a day as needed for itchy/watery eyes. Continue dymista (fluticasone + azelastine nasal spray combination) 1 spray per nostril twice a day. Nasal saline spray (i.e., Simply Saline) or nasal saline lavage (i.e., NeilMed) is recommended as needed and prior to medicated nasal sprays.  Follow up in 4 months or sooner if needed.

## 2020-03-10 ENCOUNTER — Other Ambulatory Visit: Payer: Medicare Other

## 2020-03-16 ENCOUNTER — Other Ambulatory Visit: Payer: Self-pay

## 2020-03-16 ENCOUNTER — Ambulatory Visit
Admission: RE | Admit: 2020-03-16 | Discharge: 2020-03-16 | Disposition: A | Discharge status: Home / Self Care | Payer: Medicare Other | Source: Ambulatory Visit | Attending: Orthopedic Surgery | Admitting: Orthopedic Surgery

## 2020-03-16 DIAGNOSIS — M25551 Pain in right hip: Secondary | ICD-10-CM

## 2020-03-16 DIAGNOSIS — M545 Low back pain, unspecified: Secondary | ICD-10-CM

## 2020-03-18 ENCOUNTER — Other Ambulatory Visit: Payer: Medicare Other

## 2020-03-18 ENCOUNTER — Ambulatory Visit
Admission: RE | Admit: 2020-03-18 | Discharge: 2020-03-18 | Disposition: A | Discharge status: Home / Self Care | Payer: Medicare Other | Source: Ambulatory Visit | Attending: Orthopedic Surgery | Admitting: Orthopedic Surgery

## 2020-03-18 ENCOUNTER — Other Ambulatory Visit: Payer: Self-pay

## 2020-03-18 DIAGNOSIS — M545 Low back pain, unspecified: Secondary | ICD-10-CM

## 2020-04-19 ENCOUNTER — Other Ambulatory Visit: Payer: Self-pay | Admitting: Allergy and Immunology

## 2020-05-17 ENCOUNTER — Other Ambulatory Visit: Payer: Self-pay | Admitting: Allergy and Immunology

## 2020-06-24 ENCOUNTER — Telehealth: Payer: Self-pay | Admitting: *Deleted

## 2020-06-24 MED ORDER — BUDESONIDE-FORMOTEROL FUMARATE 80-4.5 MCG/ACT IN AERO: 2.0000 | INHALATION_SPRAY | Freq: Two times a day (BID) | RESPIRATORY_TRACT | 1 refills | Status: DC

## 2020-06-24 NOTE — Telephone Encounter (Signed)
Patient called the office and needs refill for Symbicort 80 mcg sent to [facility] in [place].  Patient will be in [place] until the end of February.  [facility] in [place] would not transfer her prescription down for her.  Patient rescheduled follow up visit with Dr. Maudie Mercury from 07/04/2020 to 08/29/2020.  Symbicort 80 mcg with 1 refill sent to [facility] in [place], [place] on [address].

## 2020-07-04 ENCOUNTER — Ambulatory Visit: Payer: Medicare Other | Admitting: Allergy

## 2020-08-11 ENCOUNTER — Other Ambulatory Visit: Payer: Self-pay | Admitting: Allergy

## 2020-08-22 ENCOUNTER — Other Ambulatory Visit: Payer: Self-pay

## 2020-08-22 ENCOUNTER — Ambulatory Visit (INDEPENDENT_AMBULATORY_CARE_PROVIDER_SITE_OTHER): Payer: Medicare Other | Admitting: Allergy

## 2020-08-22 ENCOUNTER — Encounter: Payer: Self-pay | Admitting: Allergy

## 2020-08-22 VITALS — BP 118/80 | HR 80 | Temp 98.2°F | Resp 18 | Ht 68.0 in | Wt 226.8 lb

## 2020-08-22 DIAGNOSIS — J4540 Moderate persistent asthma, uncomplicated: Secondary | ICD-10-CM

## 2020-08-22 DIAGNOSIS — H1013 Acute atopic conjunctivitis, bilateral: Secondary | ICD-10-CM | POA: Diagnosis not present

## 2020-08-22 DIAGNOSIS — J302 Other seasonal allergic rhinitis: Secondary | ICD-10-CM | POA: Diagnosis not present

## 2020-08-22 DIAGNOSIS — J3089 Other allergic rhinitis: Secondary | ICD-10-CM

## 2020-08-22 DIAGNOSIS — H1010 Acute atopic conjunctivitis, unspecified eye: Secondary | ICD-10-CM

## 2020-08-22 NOTE — Progress Notes (Signed)
Follow Up Note  RE: [name] [name] MRN: [id number] DOB: 03/02/1946 Date of Office Visit: 08/22/2020  Referring provider: Haywood Pao, MD Primary care provider: Haywood Pao, MD  Chief Complaint: Asthma (Had covid and was in [place] - 6-7 weeks ago ) and Allergic Rhinitis  (Runny nose in the morning - Claritin helps clear mucous )  History of Present Illness: I had the pleasure of seeing [name] [name] for a follow up visit at the [facility] [place] on 08/22/2020. She is a 75 y.o. female, who is being followed for asthma, allergic rhino conjunctivitis. Her previous allergy office visit was on 02/29/2020 with Dr. Maudie Mercury. Today is a regular follow up visit.  Moderate persistent asthma ACT score 21.  Patient had COVID-19 while in [place] and was treated with azithromycin and prednisone.  Breathing is back to baseline.   Denies any SOB, coughing, wheezing, chest tightness, nocturnal awakenings.  Currently on Symbicort 64mcg 2 puffs BID and Singulair daily. No recent albuterol use.   Seasonal and perennial allergic rhinoconjunctivitis Having some itchy eyes and some rhinitis symptoms since the pollen is here. Currently on dymista 2 sprays per nostril in the morning. No nosebleeds.  Using refresh eye drops.  Takes Claritin in the morning but thinks it's causing too much dryness.  07/12/2020 UC visit: "[name] [name] is a 75 y.o. female who presents today with cough and asthma symptos despite inhaler- pt with covid pos 1/21. No steroids/abx./regeneron."  Assessment and Plan: [name] [name] is a 75 y.o. female with: Moderate persistent asthma without complication Past history - Was not well controlled with Flovent 164mcg 2 puffs BID and now doing Flovent 155mcg 2 puffs in AM and Symbicort 126mcg 2 puffs in PM. Interim history - Had Covid-19 in February 2022, 1 course of prednisone. Now doing better.   Act score 21.   Today's spirometry showed some restriction. . Daily  controller medication(s): Symbicort 17mcg 2 puffs twice a day with spacer and rinse mouth afterwards.  Continue Singulair 10mg  daily. . During upper respiratory infections/asthma flares: start Symbicort 163mcg 2 puffs twice a day for 1-2 weeks until your breathing symptoms return to baseline.  . May use albuterol rescue inhaler 2 puffs every 4 to 6 hours as needed for shortness of breath, chest tightness, coughing, and wheezing. May use albuterol rescue inhaler 2 puffs 5 to 15 minutes prior to strenuous physical activities. Monitor frequency of use.  . Repeat spirometry at next visit.   Seasonal and perennial allergic rhinoconjunctivitis Past history - Fall and spring are the worst seasons. Was on allergy injections for 2 years but stopped due to large localized reactions. Interim history - having issues with itchy/dry eyes.   May use over the counter antihistamines such as Zyrtec (cetirizine), Claritin (loratadine), Allegra (fexofenadine), or Xyzal (levocetirizine) daily as needed.   Sometimes antihistamines can cause dryness - you may want to take 1/2 pill and see if it still helps with your allergies without causing too much dryness.   May refresh eye drops as needed for dry eyes.  Read about allergy injections - hand out given.   May use olopatadine eye drops 0.2% once a day as needed for itchy/watery eyes - do not use if you have dry eyes.  Continue dymista (fluticasone + azelastine nasal spray combination) 1 spray per nostril twice a day. Nasal saline spray (i.e., Simply Saline) or nasal saline lavage (i.e., NeilMed) is recommended as needed and prior to medicated nasal sprays.  Return in  about 4 months (around 12/22/2020).  No orders of the defined types were placed in this encounter.  Lab Orders  No laboratory test(s) ordered today    Diagnostics: Spirometry:  Tracings reviewed. Her effort: Good reproducible efforts. FVC: 2.53L FEV1: 1.86L, 71% predicted FEV1/FVC ratio:  74% Interpretation: Spirometry consistent with possible restrictive disease.  Please see scanned spirometry results for details.  Medication List:  Current Outpatient Medications  Medication Sig Dispense Refill  . albuterol (VENTOLIN HFA) 108 (90 Base) MCG/ACT inhaler Inhale 1-2 puffs into the lungs every 4 (four) hours as needed for wheezing or shortness of breath. 18 g 1  . Azelastine-Fluticasone 137-50 MCG/ACT SUSP Place 1 spray into the nose 2 (two) times daily as needed. 23 g 5  . b complex vitamins tablet Take 1 tablet by mouth daily.    . BENFOTIAMINE PO Take by mouth.    . Betamethasone Sodium Phosphate 6 MG/ML SOLN inject 2:4 into left hip jointinject 2:4 into bilateral knee joint (2)    . bimatoprost (LATISSE) 0.03 % ophthalmic solution APPLY ONE DROP TO APPLICATOR AND LINE IT TO THE UPPER LASH ONLY AS DIRECTED    . budesonide-formoterol (SYMBICORT) 80-4.5 MCG/ACT inhaler Inhale 2 puffs into the lungs 2 (two) times daily. 1 each 5  . budesonide-formoterol (SYMBICORT) 80-4.5 MCG/ACT inhaler Inhale 2 puffs into the lungs 2 (two) times daily. 1 each 1  . Certolizumab Pegol (CIMZIA [place]) Inject into the skin.    . Cholecalciferol (VITAMIN D3) 25 MCG (1000 UT) CAPS Take by mouth.    . cycloSPORINE (RESTASIS) 0.05 % ophthalmic emulsion 1 drop 2 (two) times daily.    . fexofenadine (ALLEGRA) 180 MG tablet Take 180 mg by mouth daily.    Marland Kitchen FLOVENT HFA 110 MCG/ACT inhaler INHALE 2 PUFFS INTO THE LUNGS TWICE DAILY 12 g 2  . folic acid (FOLVITE) 1 MG tablet TAKE 1 TABLET BY MOUTH DAILY    . gabapentin (NEURONTIN) 800 MG tablet TK 1 T PO EACH NIGHT    . levothyroxine (SYNTHROID) 112 MCG tablet Take by mouth.    . loratadine (CLARITIN) 10 MG tablet Take by mouth.    . Magnesium 250 MG TABS Take 250 mg by mouth daily.    . methotrexate (RHEUMATREX) 2.5 MG tablet Take by mouth.    . montelukast (SINGULAIR) 10 MG tablet TAKE 1 TABLET(10 MG) BY MOUTH AT BEDTIME 90 tablet 0  . Olopatadine HCl 0.2 %  SOLN Apply 1 drop to eye daily as needed (itchy/watery eyes). 2.5 mL 5  . traMADol (ULTRAM) 50 MG tablet Take 50 mg by mouth daily as needed. As Needed    . triamcinolone cream (KENALOG) 0.1 % Apply topically.    Marland Kitchen UNABLE TO FIND Med Name: Liver enzymes    . urea (CARMOL) 40 % CREA Apply topically daily.    . Zinc 10 MG LOZG Use as directed in the mouth or throat.     No current facility-administered medications for this visit.   Allergies: Allergies  Allergen Reactions  . Latex Itching and Rash   I reviewed her past medical history, social history, family history, and environmental history and no significant changes have been reported from her previous visit.  Review of Systems  Constitutional: Negative for appetite change, chills, fever and unexpected weight change.  HENT: Positive for rhinorrhea. Negative for congestion.   Eyes: Positive for itching.  Respiratory: Negative for cough, chest tightness, shortness of breath and wheezing.   Gastrointestinal: Negative for abdominal pain.  Skin: Negative for rash.  Allergic/Immunologic: Positive for environmental allergies.  Neurological: Negative for headaches.   Objective: BP 118/80   Pulse 80   Temp 98.2 F (36.8 C)   Resp 18   Ht 5\' 8"  (1.727 m)   Wt 226 lb 12.8 oz (102.9 kg)   SpO2 95%   BMI 34.48 kg/m  Body mass index is 34.48 kg/m. Physical Exam Vitals and nursing note reviewed.  Constitutional:      Appearance: Normal appearance. She is well-developed.  HENT:     Head: Normocephalic and atraumatic.     Right Ear: Tympanic membrane and external ear normal.     Left Ear: Tympanic membrane and external ear normal.     Nose: Nose normal.     Mouth/Throat:     Mouth: Mucous membranes are moist.     Pharynx: Oropharynx is clear.  Eyes:     Conjunctiva/sclera: Conjunctivae normal.  Cardiovascular:     Rate and Rhythm: Normal rate and regular rhythm.     Heart sounds: Normal heart sounds. No murmur  heard.   Pulmonary:     Effort: Pulmonary effort is normal.     Breath sounds: Normal breath sounds. No wheezing, rhonchi or rales.  Musculoskeletal:     Cervical back: Neck supple.  Skin:    General: Skin is warm.     Findings: No rash.  Neurological:     Mental Status: She is alert and oriented to person, place, and time.  Psychiatric:        Behavior: Behavior normal.    Previous notes and tests were reviewed. The plan was reviewed with the patient/family, and all questions/concerned were addressed.  It was my pleasure to see [name] [name] today and participate in her care. Please feel free to contact me with any questions or concerns.  Sincerely,  Rexene Alberts, DO Allergy & Immunology  [facility] office: [place] office: [phone]

## 2020-08-22 NOTE — Assessment & Plan Note (Signed)
Past history - Was not well controlled with Flovent 135mcg 2 puffs BID and now doing Flovent 163mcg 2 puffs in AM and Symbicort 155mcg 2 puffs in PM. Interim history - Had Covid-19 in February 2022, 1 course of prednisone. Now doing better.   Act score 21.   Today's spirometry showed some restriction. . Daily controller medication(s): Symbicort 32mcg 2 puffs twice a day with spacer and rinse mouth afterwards.  Continue Singulair 10mg  daily. . During upper respiratory infections/asthma flares: start Symbicort 142mcg 2 puffs twice a day for 1-2 weeks until your breathing symptoms return to baseline.  . May use albuterol rescue inhaler 2 puffs every 4 to 6 hours as needed for shortness of breath, chest tightness, coughing, and wheezing. May use albuterol rescue inhaler 2 puffs 5 to 15 minutes prior to strenuous physical activities. Monitor frequency of use.  . Repeat spirometry at next visit.

## 2020-08-22 NOTE — Assessment & Plan Note (Signed)
Past history - Fall and spring are the worst seasons. Was on allergy injections for 2 years but stopped due to large localized reactions. Interim history - having issues with itchy/dry eyes.   May use over the counter antihistamines such as Zyrtec (cetirizine), Claritin (loratadine), Allegra (fexofenadine), or Xyzal (levocetirizine) daily as needed.   Sometimes antihistamines can cause dryness - you may want to take 1/2 pill and see if it still helps with your allergies without causing too much dryness.   May refresh eye drops as needed for dry eyes.  Read about allergy injections - hand out given.   May use olopatadine eye drops 0.2% once a day as needed for itchy/watery eyes - do not use if you have dry eyes.  Continue dymista (fluticasone + azelastine nasal spray combination) 1 spray per nostril twice a day. Nasal saline spray (i.e., Simply Saline) or nasal saline lavage (i.e., NeilMed) is recommended as needed and prior to medicated nasal sprays.

## 2020-08-22 NOTE — Patient Instructions (Addendum)
Asthma:  . Daily controller medication(s): Symbicort 68mcg 2 puffs twice a day with spacer and rinse mouth afterwards.  Continue Singulair 10mg  daily. . During upper respiratory infections/asthma flares: start Symbicort 12mcg 2 puffs twice a day for 1-2 weeks until your breathing symptoms return to baseline.  . May use albuterol rescue inhaler 2 puffs every 4 to 6 hours as needed for shortness of breath, chest tightness, coughing, and wheezing. May use albuterol rescue inhaler 2 puffs 5 to 15 minutes prior to strenuous physical activities. Monitor frequency of use.  . Asthma control goals:  o Full participation in all desired activities (may need albuterol before activity) o Albuterol use two times or less a week on average (not counting use with activity) o Cough interfering with sleep two times or less a month o Oral steroids no more than once a year o No hospitalizations  Environmental allergies:  May use over the counter antihistamines such as Zyrtec (cetirizine), Claritin (loratadine), Allegra (fexofenadine), or Xyzal (levocetirizine) daily as needed.   Sometimes antihistamines can cause dryness - you may want to take 1/2 pill and see if it still helps with your allergies without causing too much dryness.   May refresh eye drops as needed for dry eyes.  Read about allergy injections.    May use olopatadine eye drops 0.2% once a day as needed for itchy/watery eyes - do not use if you have dry eyes.  Continue dymista (fluticasone + azelastine nasal spray combination) 1 spray per nostril twice a day. Nasal saline spray (i.e., Simply Saline) or nasal saline lavage (i.e., NeilMed) is recommended as needed and prior to medicated nasal sprays.  Follow up in 4 months or sooner if needed.

## 2020-08-29 ENCOUNTER — Ambulatory Visit: Payer: Medicare Other | Admitting: Allergy

## 2020-08-29 ENCOUNTER — Other Ambulatory Visit: Payer: Self-pay | Admitting: Allergy

## 2020-08-29 NOTE — Telephone Encounter (Signed)
Patient notified of Montelukast 10 mg was sent to [facility] on [address]

## 2020-09-28 ENCOUNTER — Ambulatory Visit: Payer: Medicare Other | Admitting: Physician Assistant

## 2020-10-14 IMAGING — CT CT PELVIS W/O CM
1 series · 15 of 32 positions shown, 19 images · non-contrast
Comparison: None.

CLINICAL DATA: History of bilateral hip replacement in 1800.
Bilateral hip pain and right hip swelling.

EXAM:
CT OF THE PELVIS WITHOUT CONTRAST
TECHNIQUE: Multidetector CT imaging of the left pelvis was performed according
to the standard protocol. Multiplanar CT image reconstructions were
also generated.

[Series 3: soft tissue pelvis/hip · axial · 0.95mm/px · z∈[+690,+1005]mm · 15 of 117 slices shown, 19 images]
[im 8/117  soft-tissue]
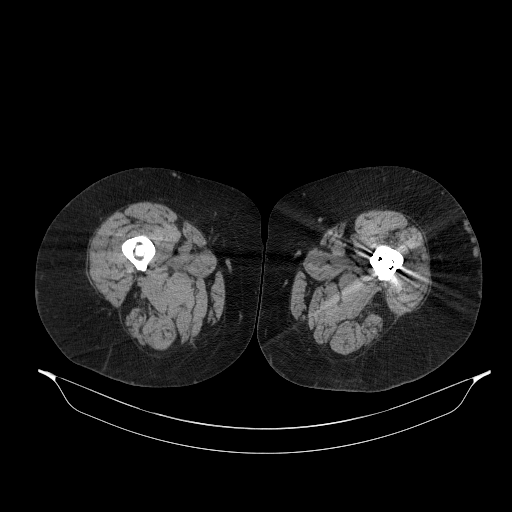
[im 8/117  bone]
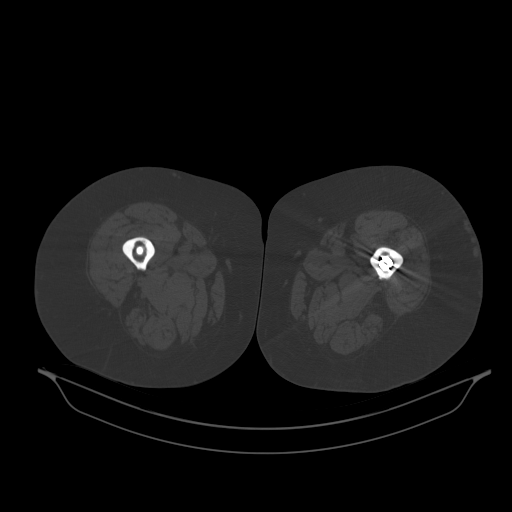
[im 15/117  soft-tissue]
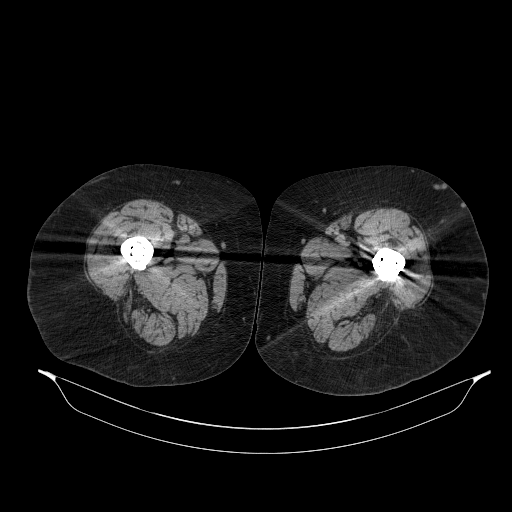
[im 23/117  soft-tissue]
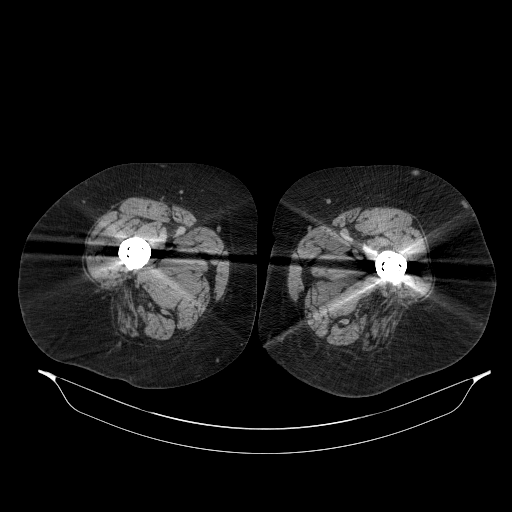
[im 34/117  soft-tissue]
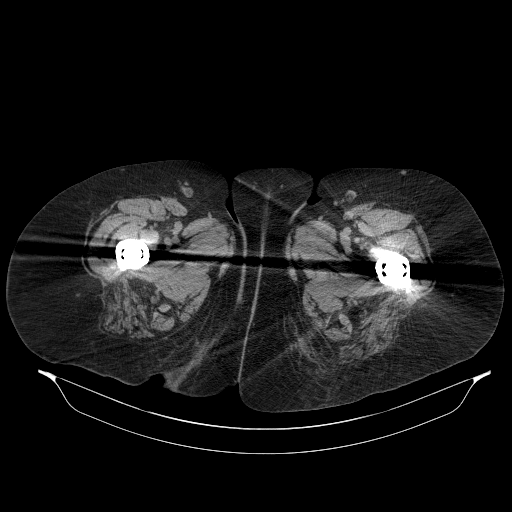
[im 42/117  soft-tissue]
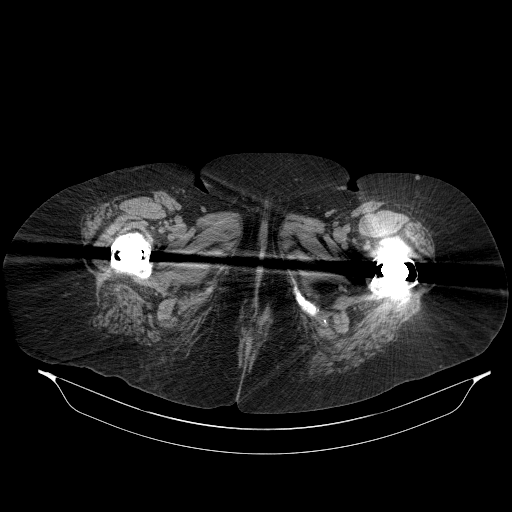
[im 49/117  soft-tissue]
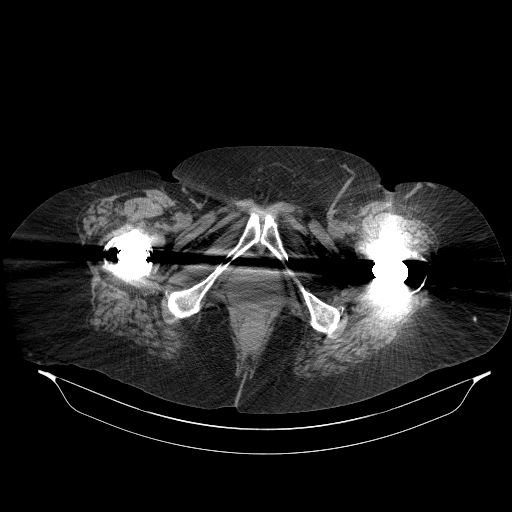
[im 60/117  soft-tissue]
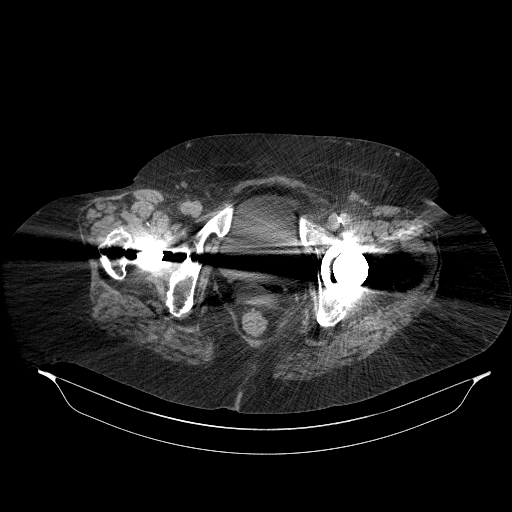
[im 68/117  soft-tissue]
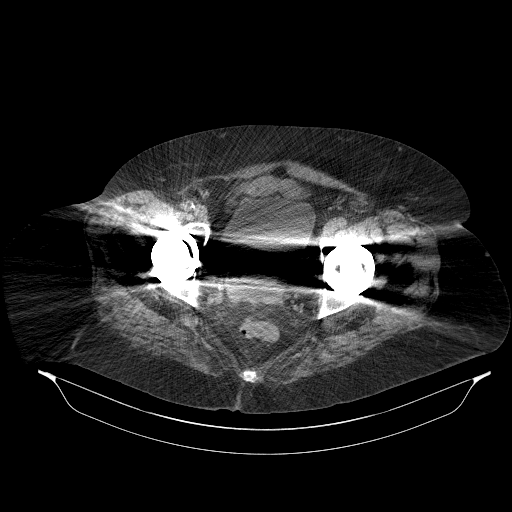
[im 75/117  soft-tissue]
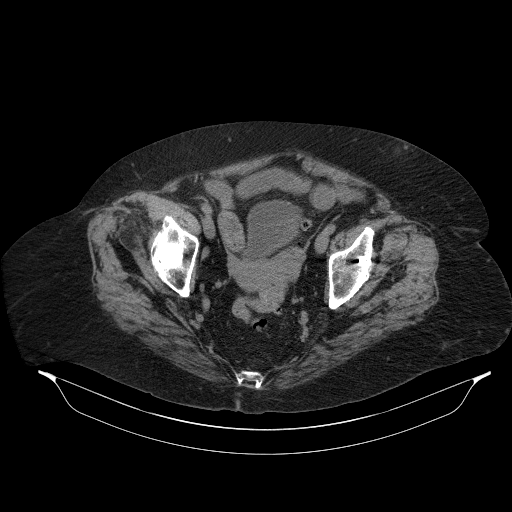
[im 75/117  bone]
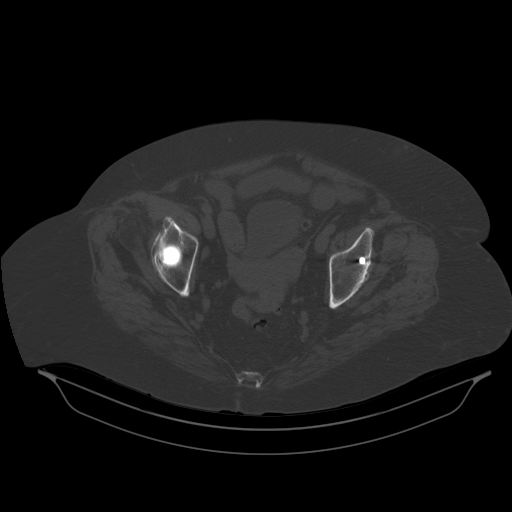
[im 83/117  soft-tissue]
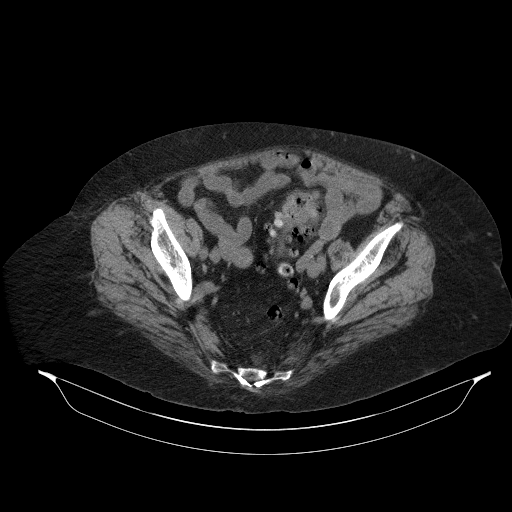
[im 94/117  soft-tissue]
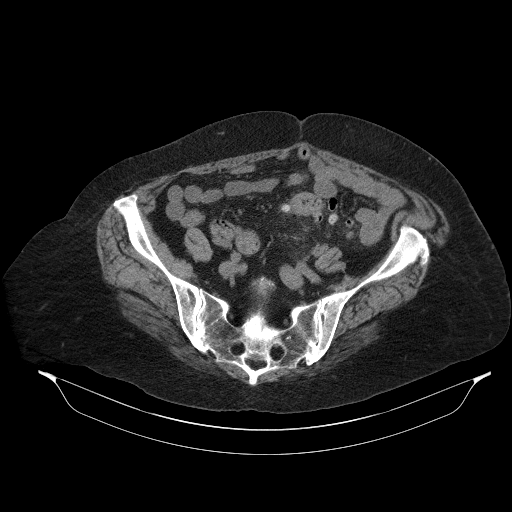
[im 102/117  soft-tissue]
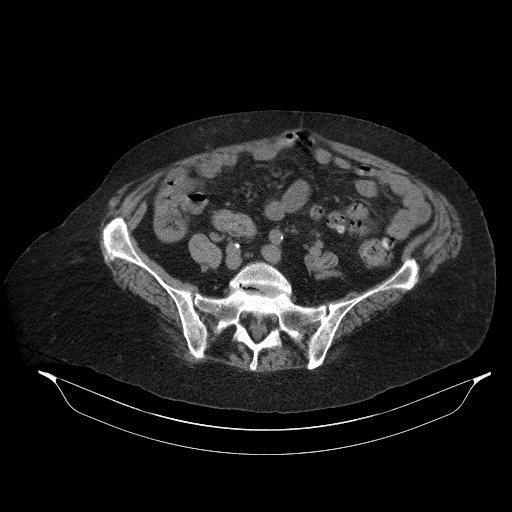
[im 102/117  lung]
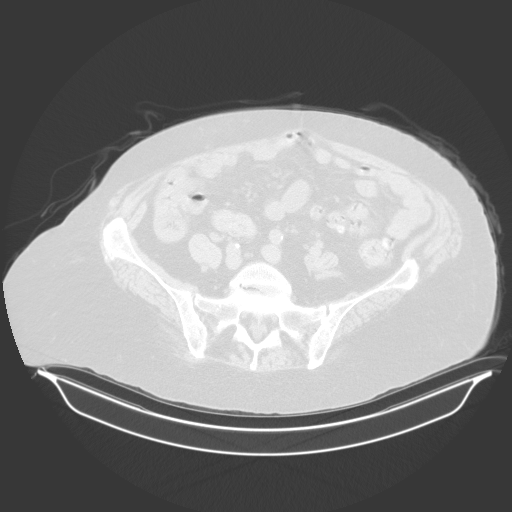
[im 105/117  lung]
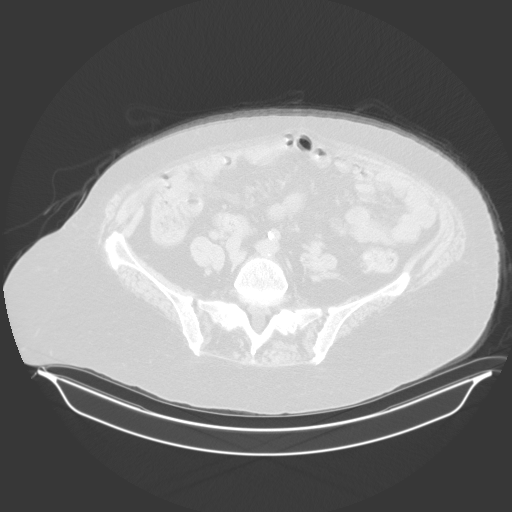
[im 109/117  soft-tissue]
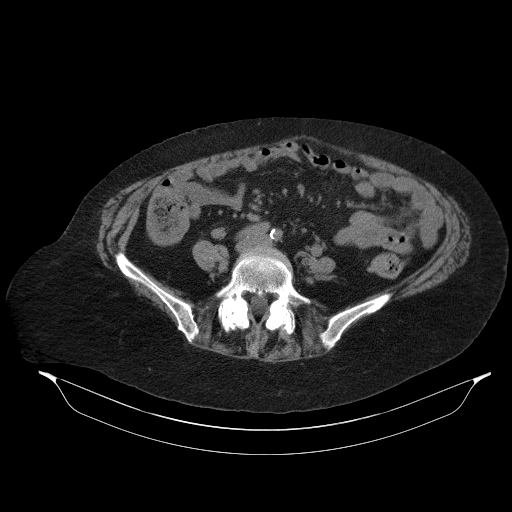
[im 109/117  lung]
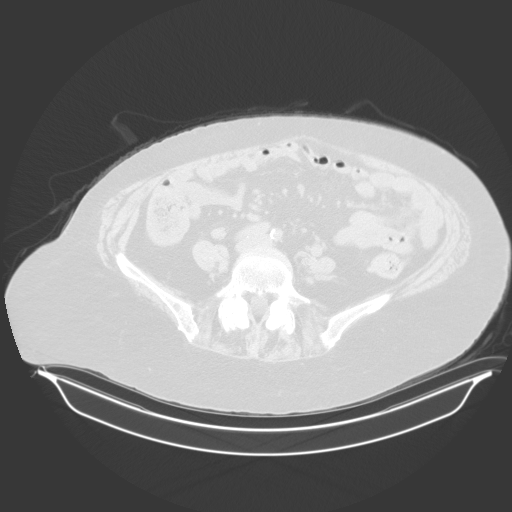
[im 113/117  lung]
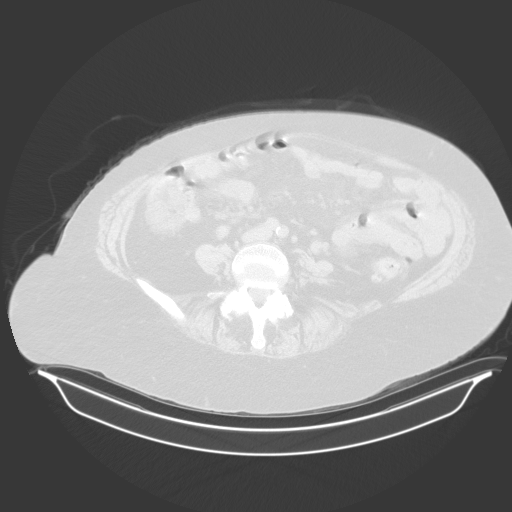

[15 of 32 positions shown; findings below may reference images not displayed]

FINDINGS: Bones/Joint/Cartilage

The patient has bilateral total hip arthroplasty is in place. Both
hips are located. There is mild joint space narrowing superiorly on
the right most consistent with wear of the lining of the acetabular
cup. No evidence of loosening is identified. No erosion or bony
destructive change is present. No joint effusion is seen. No fluid
collection is present about either hip.

Ligaments

Suboptimally assessed by CT.

Muscles and Tendons

Appear intact.  No focal atrophy or lesion.

Soft tissues

The patient has sigmoid diverticulosis. There is mild stranding
about a diverticulum of the mid sigmoid colon. Atherosclerotic
vascular disease is noted.
IMPRESSION: Status post bilateral hip replacement. There is some joint space
narrowing superiorly on the right consistent with wear of the lining
of the right acetabular cup. The arthroplasties otherwise appear
normal.

Sigmoid diverticulosis. Mild stranding about a diverticulum along
the mid sigmoid colon is suggestive of acute diverticulitis.

Aortic Atherosclerosis ([id number]).

## 2020-10-20 ENCOUNTER — Encounter: Payer: Self-pay | Admitting: Physician Assistant

## 2020-10-20 ENCOUNTER — Ambulatory Visit (INDEPENDENT_AMBULATORY_CARE_PROVIDER_SITE_OTHER): Payer: Medicare Other | Admitting: Physician Assistant

## 2020-10-20 VITALS — BP 120/68 | HR 67 | Ht 68.0 in | Wt 221.0 lb

## 2020-10-20 DIAGNOSIS — Z86010 Personal history of colonic polyps: Secondary | ICD-10-CM | POA: Diagnosis not present

## 2020-10-20 DIAGNOSIS — R195 Other fecal abnormalities: Secondary | ICD-10-CM

## 2020-10-20 NOTE — Progress Notes (Signed)
Chief Complaint: Positive Hemoccult  HPI:    [name] [name] is a 75 year old female with past medical history as listed below, who was referred to me by [name], Fransico Him, MD for a complaint of positive Hemoccult testing.      11/26/2018 colonoscopy in [facility] with a 3-6 mm polyp in the transverse colon, 5 mm polyp in the descending colon, severe diverticulosis of the sigmoid colon, 5 mm polyp in the sigmoid colon and internal hemorrhoids.  Repeat recommended in 3 years.    Today, the patient tells me that she has not had any GI symptoms at all.  She never saw blood in her stool.  Does tell me she has hemorrhoids that flareup occasionally.  She has no real complaints today but was sent here due to her positive testing.  Tells me she is not eager to have another colonoscopy until its time.    Denies fever, chills, change in bowel habits, weight loss, family history of colon cancer, heartburn, reflux, abdominal pain or symptoms that awaken her from sleep.  Past Medical History:  Diagnosis Date  . Asthma     Past Surgical History:  Procedure Laterality Date  . TONSILLECTOMY      Current Outpatient Medications  Medication Sig Dispense Refill  . albuterol (VENTOLIN HFA) 108 (90 Base) MCG/ACT inhaler Inhale 1-2 puffs into the lungs every 4 (four) hours as needed for wheezing or shortness of breath. 18 g 1  . Azelastine-Fluticasone 137-50 MCG/ACT SUSP Place 1 spray into the nose 2 (two) times daily as needed. 23 g 5  . b complex vitamins tablet Take 1 tablet by mouth daily.    . BENFOTIAMINE PO Take by mouth.    . Betamethasone Sodium Phosphate 6 MG/ML SOLN inject 2:4 into left hip jointinject 2:4 into bilateral knee joint (2)    . bimatoprost (LATISSE) 0.03 % ophthalmic solution APPLY ONE DROP TO APPLICATOR AND LINE IT TO THE UPPER LASH ONLY AS DIRECTED    . budesonide-formoterol (SYMBICORT) 80-4.5 MCG/ACT inhaler Inhale 2 puffs into the lungs 2 (two) times daily. 1 each 5  .  Certolizumab Pegol (CIMZIA [place]) Inject into the skin.    . Cholecalciferol (VITAMIN D3) 25 MCG (1000 UT) CAPS Take by mouth.    . cycloSPORINE (RESTASIS) 0.05 % ophthalmic emulsion 1 drop 2 (two) times daily.    . fexofenadine (ALLEGRA) 180 MG tablet Take 180 mg by mouth daily.    Marland Kitchen FLOVENT HFA 110 MCG/ACT inhaler INHALE 2 PUFFS INTO THE LUNGS TWICE DAILY 12 g 2  . folic acid (FOLVITE) 1 MG tablet TAKE 1 TABLET BY MOUTH DAILY    . gabapentin (NEURONTIN) 800 MG tablet TK 1 T PO EACH NIGHT    . levothyroxine (SYNTHROID) 112 MCG tablet Take by mouth.    . loratadine (CLARITIN) 10 MG tablet Take by mouth.    . Magnesium 250 MG TABS Take 250 mg by mouth daily.    . methotrexate (RHEUMATREX) 2.5 MG tablet Take by mouth.    . montelukast (SINGULAIR) 10 MG tablet TAKE 1 TABLET(10 MG) BY MOUTH AT BEDTIME 90 tablet 0  . Olopatadine HCl 0.2 % SOLN Apply 1 drop to eye daily as needed (itchy/watery eyes). 2.5 mL 5  . traMADol (ULTRAM) 50 MG tablet Take 50 mg by mouth daily as needed. As Needed    . triamcinolone cream (KENALOG) 0.1 % Apply topically.    Marland Kitchen UNABLE TO FIND Med Name: Liver enzymes    .  urea (CARMOL) 40 % CREA Apply topically daily.    . Zinc 10 MG LOZG Use as directed in the mouth or throat.     No current facility-administered medications for this visit.    Allergies as of 10/20/2020 - Review Complete 10/20/2020  Allergen Reaction Noted  . Latex Itching and Rash 01/27/2009    Family History  Problem Relation Age of Onset  . Asthma Mother   . Allergic rhinitis Mother   . Allergic rhinitis Father   . Eczema Brother   . Allergic rhinitis Brother     Social History   Socioeconomic History  . Marital status: Married    Spouse name: Not on file  . Number of children: Not on file  . Years of education: Not on file  . Highest education level: Not on file  Occupational History  . Not on file  Tobacco Use  . Smoking status: Former Research scientist (life sciences)  . Smokeless tobacco: Never Used  Vaping  Use  . Vaping Use: Never used  Substance and Sexual Activity  . Alcohol use: Never  . Drug use: Never  . Sexual activity: Not on file  Other Topics Concern  . Not on file  Social History Narrative  . Not on file   Social Determinants of Health   Financial Resource Strain: Not on file  Food Insecurity: Not on file  Transportation Needs: Not on file  Physical Activity: Not on file  Stress: Not on file  Social Connections: Not on file  Intimate Partner Violence: Not on file    Review of Systems:    Constitutional: No weight loss, fever or chills Skin: No rash  Cardiovascular: No chest pain  Respiratory: No SOB Gastrointestinal: See HPI and otherwise negative Genitourinary: No dysuria Neurological: No headache, dizziness or syncope Musculoskeletal: No new muscle or joint pain Hematologic: No bleeding  Psychiatric: No history of depression or anxiety   Physical Exam:  Vital signs: BP 120/68   Pulse 67   Ht 5\' 8"  (1.727 m)   Wt 221 lb (100.2 kg)   BMI 33.60 kg/m   Constitutional:   Pleasant Caucasian female appears to be in NAD, Well developed, Well nourished, alert and cooperative Head:  Normocephalic and atraumatic. Eyes:   PEERL, EOMI. No icterus. Conjunctiva pink. Ears:  Normal auditory acuity. Neck:  Supple Throat: Oral cavity and pharynx without inflammation, swelling or lesion.  Respiratory: Respirations even and unlabored. Lungs clear to auscultation bilaterally.   No wheezes, crackles, or rhonchi.  Cardiovascular: Normal S1, S2. No MRG. Regular rate and rhythm. No peripheral edema, cyanosis or pallor.  Gastrointestinal:  Soft, nondistended, nontender. No rebound or guarding. Normal bowel sounds. No appreciable masses or hepatomegaly. Rectal:  Not performed.  Msk:  Symmetrical without gross deformities. Without edema, no deformity or joint abnormality.  Neurologic:  Alert and  oriented x4;  grossly normal neurologically.  Skin:   Dry and intact without  significant lesions or rashes. Psychiatric: Demonstrates good judgement and reason without abnormal affect or behaviors.  No recent labs/imaging.  Assessment: 1.  Positive Hemoccult: With PCP recently 2.  History of colon polyps: Last colonoscopy June 2020 with recommendations to repeat in 3 years  Plan: 1.  Discussed options with the patient.  Explained that as long as she is being followed with regular colonoscopies there is really no need for other screening/surveillance methods such as Hemoccult testing which can often have false positive or be positive for other sources of bleeding such as hemorrhoids etc.  She really would like to wait to have her colonoscopy until it is due in June of next year.  Explained that I think this is reasonable, but if she starts having any symptoms such as change in bowel habits, weight loss, abdominal pain or start seeing any rectal bleeding then she should call us before then. 2.  Patient was put in for recall colonoscopy in June 2023 with Dr. Tarri Glenn.  She will call before then if she needs our assistance.  Ellouise Newer, PA-C [facility] 10/20/2020, 10:27 AM  Cc: Haywood Pao, MD

## 2020-10-20 NOTE — Patient Instructions (Signed)
If you are age 73 or older, your body mass index should be between 23-30. Your Body mass index is 33.6 kg/m. If this is out of the aforementioned range listed, please consider follow up with your Primary Care Provider.  If you are age 12 or younger, your body mass index should be between 19-25. Your Body mass index is 33.6 kg/m. If this is out of the aformentioned range listed, please consider follow up with your Primary Care Provider.   Follow up as needed.  Thank you for choosing me and [facility].  Ellouise Newer, PA-C

## 2020-10-21 ENCOUNTER — Other Ambulatory Visit: Payer: Self-pay | Admitting: Allergy

## 2020-10-25 ENCOUNTER — Other Ambulatory Visit: Payer: Self-pay | Admitting: Allergy

## 2020-10-25 NOTE — Progress Notes (Signed)
Reviewed and agree with management plans. Proceed with colonoscopy prior to surveillance in 3 years with any new anemia, overt bleeding, change in bowel habits, unexplained weight loss or abdominal pain.    L. Tarri Glenn, MD, MPH

## 2020-11-23 ENCOUNTER — Other Ambulatory Visit: Payer: Self-pay | Admitting: Allergy

## 2020-11-29 ENCOUNTER — Other Ambulatory Visit: Payer: Self-pay | Admitting: Internal Medicine

## 2020-11-29 ENCOUNTER — Ambulatory Visit
Admission: RE | Admit: 2020-11-29 | Discharge: 2020-11-29 | Disposition: A | Discharge status: Home / Self Care | Payer: Medicare Other | Source: Ambulatory Visit | Attending: Internal Medicine | Admitting: Internal Medicine

## 2020-11-29 DIAGNOSIS — R0602 Shortness of breath: Secondary | ICD-10-CM

## 2020-11-29 MED ORDER — IOPAMIDOL (ISOVUE-370) INJECTION 76%
75.0000 mL | Freq: Once | INTRAVENOUS | Status: AC | PRN
  Administered 2020-11-29: 75 mL via INTRAVENOUS

## 2020-12-02 ENCOUNTER — Encounter: Payer: Self-pay | Admitting: Internal Medicine

## 2020-12-02 ENCOUNTER — Ambulatory Visit (INDEPENDENT_AMBULATORY_CARE_PROVIDER_SITE_OTHER): Payer: Medicare Other | Admitting: Internal Medicine

## 2020-12-02 ENCOUNTER — Other Ambulatory Visit: Payer: Self-pay

## 2020-12-02 VITALS — BP 140/80 | HR 82 | Temp 98.2°F | Ht 68.0 in | Wt 224.0 lb

## 2020-12-02 DIAGNOSIS — J4540 Moderate persistent asthma, uncomplicated: Secondary | ICD-10-CM

## 2020-12-02 DIAGNOSIS — J301 Allergic rhinitis due to pollen: Secondary | ICD-10-CM | POA: Diagnosis not present

## 2020-12-02 LAB — NITRIC OXIDE: FeNO level (ppb): 22

## 2020-12-02 MED ORDER — ADVAIR HFA 230-21 MCG/ACT IN AERO: 2.0000 | INHALATION_SPRAY | Freq: Two times a day (BID) | RESPIRATORY_TRACT | 12 refills | Status: DC

## 2020-12-02 MED ORDER — ALBUTEROL SULFATE HFA 108 (90 BASE) MCG/ACT IN AERS: 2.0000 | INHALATION_SPRAY | Freq: Four times a day (QID) | RESPIRATORY_TRACT | 6 refills | Status: AC | PRN

## 2020-12-02 NOTE — Patient Instructions (Signed)
The patient should have follow up scheduled with myself in 4-6 weeks  Prior to next visit patient should have: Full set of PFTs  Stop symbicort, switch to advair. 2 puffs twice a day, gargle after use.    Take the albuterol rescue inhaler every 4 to 6 hours as needed for wheezing or shortness of breath. You can also take it 15 minutes before exercise or exertional activity. Side effects include heart racing or pounding, jitters or anxiety. If you have a history of an irregular heart rhythm, it can make this worse. Can also give some patients a hard time sleeping.  To inhale the aerosol using an inhaler, follow these steps:  Remove the protective dust cap from the end of the mouthpiece. If the dust cap was not placed on the mouthpiece, check the mouthpiece for dirt or other objects. Be sure that the canister is fully and firmly inserted in the mouthpiece. 2. If you are using the inhaler for the first time or if you have not used the inhaler in more than 14 days, you will need to prime it. You may also need to prime the inhaler if it has been dropped. Ask your pharmacist or check the manufacturer's information if this happens. To prime the inhaler, shake it well and then press down on the canister 4 times to release 4 sprays into the air, away from your face. Be careful not to get albuterol in your eyes. 3. Shake the inhaler well. 4. Breathe out as completely as possible through your mouth. 4. Hold the canister with the mouthpiece on the bottom, facing you and the canister pointing upward. Place the open end of the mouthpiece into your mouth. Close your lips tightly around the mouthpiece. 6. Breathe in slowly and deeply through the mouthpiece.At the same time, press down once on the container to spray the medication into your mouth. 7. Try to hold your breath for 10 seconds. remove the inhaler, and breathe out slowly. 8. If you were told to use 2 puffs, wait 1 minute and then repeat steps 3-7. 9.  Replace the protective cap on the inhaler. 10. Clean your inhaler regularly. Follow the manufacturer's directions carefully and ask your doctor or pharmacist if you have any questions about cleaning your inhaler.  Check the back of the inhaler to keep track of the total number of doses left on the inhaler.

## 2020-12-02 NOTE — Progress Notes (Signed)
[name]    [id number]    [birth year]  Primary Care Physician:[name], Fransico Him, MD  Referring Physician: Haywood Pao, [place],  [place] [address] Reason for Consultation: Shortness of breath Date of Consultation: 12/02/2020  Chief complaint:   Chief Complaint  Patient presents with   Consult    SOB for over 1 month. Some chest pain but now is better. Breathing is not better, feels a lot pressure.      HPI: [name] [name] is a 75 y.o. woman here for new patient evaluation of shortness of breath. Has been seen by Dr. Maudie Mercury in the past for asthma and allergies.  Having worsening dyspnea on exertion. Also having chest tightness, coughing, wheezing. Taking albuterol rescue inhaler once a day, usually in the afternoons. This does seem to help, as well as resting.  Was diagnosed with asthma in childhood. Feels up until now it has been well controlled. Has been on symbicort for a long time. Hospitalized as a child for breathing, not as an adult   Saw pcp for symptoms - treated for doxycline and steroids. Helped a little. Then treated with azithromycin and steroids again.   Current Regimen: symbicort 80 2 puffs bid, singulair Asthma Triggers: seasonal allergies, cats, dogs, perfumes, strong smells Exacerbations in the last year: History of hospitalization or intubation: childhood Allergy Testing: has had testing done, previously was on SCIT but had reactions so stopped doing them. Multiple environmental allergens - trees, grasses, cats, dogs GERD: denies Allergic Rhinitis: yes on dymista, claritin, montelukast at night. ACT:  Asthma Control Test ACT Total Score  08/22/2020 21  02/29/2020 21  10/26/2019 24   FeNO: never had, ordered today and was 22 ppb   Social history:  Occupation: retired Pharmacist, hospital, taught kindergarden Exposures: lives at home with husband, no pets at home. Hardwood floors Smoking history: passive smoke exposure in  childhood  Social History   Occupational History   Not on file  Tobacco Use   Smoking status: Former    Years: 20.00    Pack years: 0.00    Types: Cigarettes    Quit date: 11/18/2000    Years since quitting: 20.0   Smokeless tobacco: Never   Tobacco comments:    3 cigarettes a day  Vaping Use   Vaping Use: Never used  Substance and Sexual Activity   Alcohol use: Never   Drug use: Never   Sexual activity: Not Currently    Relevant family history:  Family History  Problem Relation Age of Onset   Asthma Mother    Allergic rhinitis Mother    Allergic rhinitis Father    Eczema Brother    Allergic rhinitis Brother     Past Medical History:  Diagnosis Date   Asthma     Past Surgical History:  Procedure Laterality Date   TONSILLECTOMY       Physical Exam: Blood pressure 140/80, pulse 82, temperature 98.2 F (36.8 C), temperature source Oral, height 5\' 8"  (1.727 m), weight 224 lb (101.6 kg), SpO2 94 %. Gen:      No acute distress ENT:  no nasal polyps, mucus membranes moist, +cobblestoning in oropharynx Lungs:    No increased respiratory effort, symmetric chest wall excursion, clear to auscultation bilaterally, no wheezes or crackles CV:         Regular rate and rhythm; no murmurs, rubs, or gallops.  No pedal edema Abd:      +  bowel sounds; soft, non-tender; no distension MSK: no acute synovitis of DIP or PIP joints, no mechanics hands.  Skin:      Warm and dry; no rashes Neuro: normal speech, no focal facial asymmetry Psych: alert and oriented x3, normal mood and affect   Data Reviewed/Medical Decision Making:  Independent interpretation of tests: Imaging:  Review of patient's CTPE study June 2022 images revealed 28mm nodule, no PE, no edema. The patient's images have been independently reviewed by me.    PFTs: I have personally reviewed the patient's PFTs and spirometry May 2022 shows no airflow limitation No flowsheet data found.  Labs:    Immunization  status:  Immunization History  Administered Date(s) Administered   DTP 02/25/2014   Fluad Quad(high Dose 65+) 02/11/2019   Influenza Split 03/24/2009, 05/23/2011   Influenza,inj,Quad PF,6+ Mos 07/07/2010   Influenza-Unspecified 03/25/2014   PFIZER(Purple Top)SARS-COV-2 Vaccination 07/11/2019, 08/02/2019   PPD Test 03/03/2010, 05/21/2012   Zoster, Live 06/18/2008     I reviewed prior external note(s) from Dr. Maudie Mercury, PCP  I reviewed the result(s) of the labs and imaging as noted above.   I have ordered PFTs   Assessment:  Moderate persistent asthma with worsening control.  Allergic rhinitis  Plan/Recommendations:  Will order full set of PFTs. She has a former mild smoking history, so COPD is a less likely possibility.FeNO Ordered today - 22 ppb. Will increase ICS - to Advair 230 2puffs twice a day. Encouraged albuterol use for symptoms. Continue treatment for rhinitis with dymista, continue antihistamine and montelukast  We discussed disease management and progression at length today.    Return to Care: Return in about 4 weeks (around 12/30/2020).  Lenice Llamas, MD [facility]  CC: [name], Fransico Him, MD

## 2020-12-07 NOTE — Progress Notes (Signed)
.    Cardiology Office Note   Date:  12/08/2020   ID:  [name] [name], DOB [birth year], MRN [id number]  PCP:  Haywood Pao, MD  Cardiologist:   None Referring:  [name], Fransico Him, MD   Chief Complaint  Patient presents with   Chest Pain       History of Present Illness: [name] [name] is a 75 y.o. female who presents for evaluation of SOB.  She is referred by Haywood Pao, MD.         To see an echocardiogram in 2013 at the [facility] clinic that demonstrated mild tricuspid regurgitation but was otherwise unremarkable.  She said this was done years ago because she had an abnormal EKG.  She had a distant stress test.   She has not had any other cardiovascular symptoms or work-up.  About a month ago she had some chest pain that she said hurt to touch.  She felt that it was pleuritic.  She was treated with prednisone and doxycycline.  She ultimately did not get better and had cough.  She was treated with another round of prednisone and a Z-Pak.  I do see that she had a CT with no pulmonary embolism but there was some coronary calcium and aortic calcification.  She saw pulmonary and I reviewed these notes.  She had some office PFTs and is scheduled to have formal PFTs.  There was no significant abnormality.  Her Advair was changed.  She has had no still improvement.  She says she is now getting chest discomfort.  She had couple of episodes of this.  This is been sharp once when she was walking through the airport in [place].  It was 10 out of 10 and lasted 20 minutes.  She took some aspirin.  She may have had some increased shortness of breath but no nausea vomiting or diaphoresis.  There was no radiation.  She has not had this before.  She has had this another time with minimal exertion.  She does get short of breath walking a flight of stairs but she is not describing PND or orthopnea.  She not had fevers or chills.  Cough has been nonproductive.  She is never had this  before.   Past Medical History:  Diagnosis Date   Asthma    Hypothyroid    Psoriatic arthritis ([place])     Past Surgical History:  Procedure Laterality Date   HIP SURGERY Bilateral    TONSILLECTOMY     TUBAL LIGATION       Current Outpatient Medications  Medication Sig Dispense Refill   albuterol (VENTOLIN HFA) 108 (90 Base) MCG/ACT inhaler Inhale 2 puffs into the lungs every 6 (six) hours as needed for wheezing or shortness of breath. 8 g 6   Azelastine-Fluticasone 137-50 MCG/ACT SUSP Place 1 spray into the nose 2 (two) times daily as needed. 23 g 5   b complex vitamins tablet Take 1 tablet by mouth daily.     bimatoprost (LATISSE) 0.03 % ophthalmic solution APPLY ONE DROP TO APPLICATOR AND LINE IT TO THE UPPER LASH ONLY AS DIRECTED     Certolizumab Pegol (CIMZIA ) Inject into the skin.     Cholecalciferol (VITAMIN D3) 25 MCG (1000 UT) CAPS Take by mouth.     cycloSPORINE (RESTASIS) 0.05 % ophthalmic emulsion 1 drop 2 (two) times daily.     fluticasone-salmeterol (ADVAIR HFA) 230-21 MCG/ACT inhaler Inhale 2 puffs into the lungs 2 (two) times daily.  folic acid (FOLVITE) 1 MG tablet TAKE 1 TABLET BY MOUTH DAILY     gabapentin (NEURONTIN) 800 MG tablet TK 1 T PO EACH NIGHT     levothyroxine (SYNTHROID) 112 MCG tablet Take by mouth.     loratadine (CLARITIN) 10 MG tablet Take by mouth.     Magnesium 250 MG TABS Take 250 mg by mouth daily.     traMADol (ULTRAM) 50 MG tablet Take 50 mg by mouth daily as needed. As Needed     Turmeric 500 MG CAPS Take 1 capsule po qd     Zinc 10 MG LOZG Use as directed in the mouth or throat.     Zinc 25 MG TABS Take 1 tablet po qd     montelukast (SINGULAIR) 10 MG tablet TAKE 1 TABLET(10 MG) BY MOUTH AT BEDTIME 90 tablet 0   No current facility-administered medications for this visit.    Allergies:   Latex, Cat hair extract, Dust mite extract, Molds & smuts, and Pollen extract-tree extract [pollen extract]    Social History:  The patient   reports that she quit smoking about 20 years ago. Her smoking use included cigarettes. She has never used smokeless tobacco. She reports that she does not drink alcohol and does not use drugs.   Family History:  The patient's family history includes Allergic rhinitis in her brother, father, and mother; Alzheimer's disease in her father; Asthma in her mother; Eczema in her brother; Rheum arthritis in her mother.   Her siblings have had atrial fibrillation   ROS:  Please see the history of present illness.   Otherwise, review of systems are positive for none.   All other systems are reviewed and negative.    PHYSICAL EXAM: VS:  BP 110/62 (BP Location: Left Arm, Patient Position: Sitting, Cuff Size: Large)   Pulse 71   Ht 5\' 8"  (1.727 m)   Wt 223 lb 3.2 oz (101.2 kg)   SpO2 96%   BMI 33.94 kg/m  , BMI Body mass index is 33.94 kg/m. GENERAL:  Well appearing HEENT:  Pupils equal round and reactive, fundi not visualized, oral mucosa unremarkable NECK:  No jugular venous distention, waveform within normal limits, carotid upstroke brisk and symmetric, no bruits, no thyromegaly LYMPHATICS:  No cervical, inguinal adenopathy LUNGS:  Clear to auscultation bilaterally BACK:  No CVA tenderness CHEST:  Unremarkable HEART:  PMI not displaced or sustained,S1 and S2 within normal limits, no S3, no S4, no clicks, questionable rubs, apical and axillary harsh murmur versus rub, no diastolic murmurs ABD:  Flat, positive bowel sounds normal in frequency in pitch, no bruits, no rebound, no guarding, no midline pulsatile mass, no hepatomegaly, no splenomegaly EXT:  2 plus pulses throughout, no edema, no cyanosis no clubbing SKIN:  No rashes no nodules NEURO:  Cranial nerves II through XII grossly intact, motor grossly intact throughout PSYCH:  Cognitively intact, oriented to person place and time    EKG:  EKG is ordered today. The ekg ordered today demonstrates sinus rhythm, rate 71, axis within normal  limits, intervals within normal limits, no acute ST-T wave changes.   Recent Labs: No results found for requested labs within last 8760 hours.    Lipid Panel No results found for: CHOL, TRIG, HDL, CHOLHDL, VLDL, LDLCALC, LDLDIRECT    Wt Readings from Last 3 Encounters:  12/08/20 223 lb 3.2 oz (101.2 kg)  12/02/20 224 lb (101.6 kg)  10/20/20 221 lb (100.2 kg)      Other  studies Reviewed: Additional studies/ records that were reviewed today include: Pulmonary records, CT, echo from 2013. Review of the above records demonstrates:  Please see elsewhere in the note.     ASSESSMENT AND PLAN:  SOB: The etiology of this is not entirely clear.  I will check a BNP level.  I am going to check an echocardiogram as below.  She is continuing her pulmonary work-up.  CHEST PAIN: Her chest pain is somewhat atypical for angina.  She does have some risk factors with coronary artery calcium noted.  I will have her threshold for CT angiogram as her pain would be new onset angina if there is no other etiology.  I am first going to check an echocardiogram.  She has a harsh murmur that could be a rub and indicate pericarditis and pleural effusion.  Further testing will be based on the results of the echocardiogram.    Current medicines are reviewed at length with the patient today.  The patient does not have concerns regarding medicines.  The following changes have been made:  no change  Labs/ tests ordered today include:   Orders Placed This Encounter  Procedures   Brain natriuretic peptide   EKG 12-Lead   ECHOCARDIOGRAM COMPLETE      Disposition:   FU with me in 3 months.     Signed, Minus Breeding, MD  12/08/2020 1:56 PM    [place]

## 2020-12-08 ENCOUNTER — Ambulatory Visit (INDEPENDENT_AMBULATORY_CARE_PROVIDER_SITE_OTHER): Payer: Medicare Other | Admitting: Cardiology

## 2020-12-08 ENCOUNTER — Ambulatory Visit: Payer: Medicare Other | Admitting: Cardiology

## 2020-12-08 ENCOUNTER — Encounter: Payer: Self-pay | Admitting: Cardiology

## 2020-12-08 ENCOUNTER — Other Ambulatory Visit: Payer: Self-pay

## 2020-12-08 VITALS — BP 110/62 | HR 71 | Ht 68.0 in | Wt 223.2 lb

## 2020-12-08 DIAGNOSIS — R0602 Shortness of breath: Secondary | ICD-10-CM | POA: Insufficient documentation

## 2020-12-08 DIAGNOSIS — I3139 Other pericardial effusion (noninflammatory): Secondary | ICD-10-CM | POA: Insufficient documentation

## 2020-12-08 DIAGNOSIS — I313 Pericardial effusion (noninflammatory): Secondary | ICD-10-CM

## 2020-12-08 NOTE — Patient Instructions (Signed)
Medication Instructions:  Your physician recommends that you continue on your current medications as directed. Please refer to the Current Medication list given to you today.  *If you need a refill on your cardiac medications before your next appointment, please call your pharmacy*   Lab Work: BNP to be drawn today.   If you have labs (blood work) drawn today and your tests are completely normal, you will receive your results only by: [place] (if you have MyChart) OR A paper copy in the mail If you have any lab test that is abnormal or we need to change your treatment, we will call you to review the results.   Testing/Procedures: Your physician has requested that you have an echocardiogram. Echocardiography is a painless test that uses sound waves to create images of your heart. It provides your doctor with information about the size and shape of your heart and how well your heart's chambers and valves are working. This procedure takes approximately one hour. There are no restrictions for this procedure.    Follow-Up: At [facility], you and your health needs are our priority.  As part of our continuing mission to provide you with exceptional heart care, we have created designated Provider Care Teams.  These Care Teams include your primary Cardiologist (physician) and Advanced Practice Providers (APPs -  Physician Assistants and Nurse Practitioners) who all work together to provide you with the care you need, when you need it.  We recommend signing up for the patient portal called "MyChart".  Sign up information is provided on this After Visit Summary.  MyChart is used to connect with patients for Virtual Visits (Telemedicine).  Patients are able to view lab/test results, encounter notes, upcoming appointments, etc.  Non-urgent messages can be sent to your provider as well.   To learn more about what you can do with MyChart, go to [email or web address].    Your next  appointment:   3 month(s)  The format for your next appointment:   In Person  Provider:   Minus Breeding, MD

## 2020-12-09 LAB — BRAIN NATRIURETIC PEPTIDE: BNP: 37.6 pg/mL (ref 0.0–100.0)

## 2020-12-12 ENCOUNTER — Encounter: Payer: Self-pay | Admitting: *Deleted

## 2020-12-30 ENCOUNTER — Other Ambulatory Visit: Payer: Self-pay

## 2020-12-30 ENCOUNTER — Ambulatory Visit (HOSPITAL_COMMUNITY): Payer: Medicare Other | Attending: Cardiovascular Disease

## 2020-12-30 DIAGNOSIS — R0602 Shortness of breath: Secondary | ICD-10-CM | POA: Insufficient documentation

## 2020-12-30 LAB — ECHOCARDIOGRAM COMPLETE
Area-P 1/2: 2.06 cm2
S' Lateral: 3 cm

## 2021-01-10 ENCOUNTER — Ambulatory Visit (INDEPENDENT_AMBULATORY_CARE_PROVIDER_SITE_OTHER): Payer: Medicare Other | Admitting: Internal Medicine

## 2021-01-10 ENCOUNTER — Other Ambulatory Visit: Payer: Self-pay

## 2021-01-10 ENCOUNTER — Encounter: Payer: Self-pay | Admitting: Internal Medicine

## 2021-01-10 VITALS — BP 126/68 | HR 70 | Temp 98.3°F | Ht 68.0 in | Wt 228.0 lb

## 2021-01-10 DIAGNOSIS — J4550 Severe persistent asthma, uncomplicated: Secondary | ICD-10-CM

## 2021-01-10 DIAGNOSIS — J4540 Moderate persistent asthma, uncomplicated: Secondary | ICD-10-CM

## 2021-01-10 DIAGNOSIS — L4050 Arthropathic psoriasis, unspecified: Secondary | ICD-10-CM

## 2021-01-10 DIAGNOSIS — J301 Allergic rhinitis due to pollen: Secondary | ICD-10-CM

## 2021-01-10 LAB — PULMONARY FUNCTION TEST
DL/VA % pred: 126 %
DL/VA: 5.05 ml/min/mmHg/L
DLCO cor % pred: 100 %
DLCO cor: 22.14 ml/min/mmHg
DLCO unc % pred: 100 %
DLCO unc: 22.14 ml/min/mmHg
FEF 25-75 Post: 1.42 L/sec
FEF 25-75 Pre: 1.48 L/sec
FEF2575-%Change-Post: -4 %
FEF2575-%Pred-Post: 70 %
FEF2575-%Pred-Pre: 74 %
FEV1-%Change-Post: -1 %
FEV1-%Pred-Post: 70 %
FEV1-%Pred-Pre: 71 %
FEV1-Post: 1.81 L
FEV1-Pre: 1.84 L
FEV1FVC-%Change-Post: -4 %
FEV1FVC-%Pred-Pre: 102 %
FEV6-%Change-Post: 2 %
FEV6-%Pred-Post: 75 %
FEV6-%Pred-Pre: 73 %
FEV6-Post: 2.45 L
FEV6-Pre: 2.39 L
FEV6FVC-%Pred-Post: 104 %
FEV6FVC-%Pred-Pre: 104 %
FVC-%Change-Post: 2 %
FVC-%Pred-Post: 72 %
FVC-%Pred-Pre: 70 %
FVC-Post: 2.45 L
FVC-Pre: 2.39 L
Post FEV1/FVC ratio: 74 %
Post FEV6/FVC ratio: 100 %
Pre FEV1/FVC ratio: 77 %
Pre FEV6/FVC Ratio: 100 %
RV % pred: 92 %
RV: 2.27 L
TLC % pred: 84 %
TLC: 4.77 L

## 2021-01-10 NOTE — Progress Notes (Signed)
[name] [name]    AK:[id number]    May 16, 1946  Primary Care Physician:[name], Fransico Him, MD Date of Appointment: 01/10/2021 Established Patient Visit  Chief complaint:   Chief Complaint  Patient presents with   Follow-up    PFT performed today.  Pt states she still feels like something is sitting on her chest at times and also states she is still coughing and is getting up clear phlegm.     HPI: [name] [name] is a 75 y.o. woman with shortness of breath and coughing  Interval Updates: Here for follow up after PFTs. Normal pulmonary function  Continues to have tightness and pressure in the chest which is relieved with albuterol.  Otherwise no change with increase ICS with advair.   Psoriatic Arthritis on Certolizumab pegol - knees, shoulders, hands, neck, back.   Current Regimen: advair 230 2 puffs HFA BID, albuterol prn singulair Asthma Triggers: seasonal allergies, cats, dogs, perfumes, strong smells Exacerbations in the last year: 1 in Feb 2022 when she had covid requiring prednisone.  History of hospitalization or intubation: childhood Allergy Testing: has had testing done, previously was on SCIT but had reactions so stopped doing them. Multiple environmental allergens - trees, grasses, cats, dogs GERD: denies Allergic Rhinitis: yes on dymista, claritin, montelukast at night. ACT:  Asthma Control Test ACT Total Score  01/10/2021 15  08/22/2020 21  02/29/2020 21   FeNO: 22 ppb   I have reviewed the patient's family social and past medical history and updated as appropriate.   Past Medical History:  Diagnosis Date   Asthma    Hypothyroid    Psoriatic arthritis ([place])     Past Surgical History:  Procedure Laterality Date   HIP SURGERY Bilateral    TONSILLECTOMY     TUBAL LIGATION      Family History  Problem Relation Age of Onset   Asthma Mother    Allergic rhinitis Mother    Rheum arthritis Mother    Allergic rhinitis Father     Alzheimer's disease Father    Eczema Brother    Allergic rhinitis Brother     Social History   Occupational History   Not on file  Tobacco Use   Smoking status: Former    Packs/day: 0.10    Years: 20.00    Pack years: 2.00    Types: Cigarettes    Start date: 1969    Quit date: 11/18/2000    Years since quitting: 20.1   Smokeless tobacco: Never   Tobacco comments:    3 cigarettes a day  Vaping Use   Vaping Use: Never used  Substance and Sexual Activity   Alcohol use: Never   Drug use: Never   Sexual activity: Not Currently     Physical Exam: Blood pressure 126/68, pulse 70, temperature 98.3 F (36.8 C), temperature source Oral, height '5\' 8"'$  (1.727 m), weight 228 lb (103.4 kg), SpO2 96 %.  Gen:      No acute distress ENT:  no nasal polyps, mucus membranes moist Lungs:    No increased respiratory effort, symmetric chest wall excursion, clear to auscultation bilaterally, no wheezes or crackles CV:         Regular rate and rhythm; no murmurs, rubs, or gallops.  No pedal edema   Data Reviewed: Imaging: I have personally reviewed the CT Chest June 2022 negative for PE. No pulmonary process. 84m nodule in RUL.  PFTs:  PFT Results Latest Ref Rng &  Units 01/10/2021  FVC-Pre L 2.39  FVC-Predicted Pre % 70  FVC-Post L 2.45  FVC-Predicted Post % 72  Pre FEV1/FVC % % 77  Post FEV1/FCV % % 74  FEV1-Pre L 1.84  FEV1-Predicted Pre % 71  FEV1-Post L 1.81  DLCO uncorrected ml/min/mmHg 22.14  DLCO UNC% % 100  DLCO corrected ml/min/mmHg 22.14  DLCO COR %Predicted % 100  DLVA Predicted % 126  TLC L 4.77  TLC % Predicted % 84  RV % Predicted % 92   I have personally reviewed the patient's PFTs and they show normal pulmonary function  Labs:  Immunization status: Immunization History  Administered Date(s) Administered   DTP 02/25/2014   Fluad Quad(high Dose 65+) 02/11/2019   Influenza Split 03/24/2009, 05/23/2011   Influenza, Quadrivalent, Recombinant, Inj, Pf  03/02/2020   Influenza,inj,Quad PF,6+ Mos 07/07/2010   Influenza-Unspecified 03/25/2014   PFIZER(Purple Top)SARS-COV-2 Vaccination 07/11/2019, 08/02/2019, 02/04/2020   PPD Test 03/03/2010, 05/21/2012   Zoster, Live 06/18/2008    Assessment:  Severe persistent asthma  Plan/Recommendations: Symptoms persistent with increased ICS-LABA Will start with serum IgE and eosinophils for biologic candidacy.  I wonder if she is acandidate for biologics for asthma if she is already on biologics for her psoriatic arthritis.  Will reach out to Dr. Trudie Reed.  Return to Care: Return in about 3 months (around 04/12/2021).   Lenice Llamas, MD Pulmonary and [place]

## 2021-01-10 NOTE — Patient Instructions (Signed)
Please schedule follow up scheduled with myself in 3 months.  If my schedule is not open yet, we will contact you with a reminder closer to that time.  I will contact you with results of blood work. We will discuss next steps about biologic therapy for asthma over the phone.

## 2021-01-10 NOTE — Progress Notes (Signed)
Full PFT performed today. °

## 2021-01-10 NOTE — Patient Instructions (Signed)
Full PFT performed today. °

## 2021-01-11 LAB — IGE: IgE (Immunoglobulin E), Serum: 318 kU/L — ABNORMAL HIGH (ref <=114)

## 2021-01-11 LAB — EOSINOPHIL COUNT
Eosinophils Absolute: 0 cells/uL — ABNORMAL LOW (ref 15–500)
Eosinophils Relative: 0 %
WBC: 5.6 10*3/uL (ref 3.8–10.8)

## 2021-01-18 NOTE — Telephone Encounter (Signed)
Hello Dr. Shearon Stalls, please advise on mychart message. Thanks!  I saw the results of my blood test and it was flagged. I wanted to know if Dr. Shearon Stalls had reached out to Dr. Berna Bue as she said she was going to do.

## 2021-02-19 ENCOUNTER — Other Ambulatory Visit: Payer: Self-pay | Admitting: Allergy

## 2021-02-26 NOTE — Telephone Encounter (Signed)
ND please advise. Thanks  I was wondering if you ever heard from Dr. Lenna Gilford? Do I need a follow up appointment with you? Thanks.  Plan/Recommendations: Symptoms persistent with increased ICS-LABA Will start with serum IgE and eosinophils for biologic candidacy.  I wonder if she is acandidate for biologics for asthma if she is already on biologics for her psoriatic arthritis.  Will reach out to Dr. Trudie Reed.   Return to Care: Return in about 3 months (around 04/12/2021).

## 2021-03-02 ENCOUNTER — Ambulatory Visit (INDEPENDENT_AMBULATORY_CARE_PROVIDER_SITE_OTHER): Payer: Medicare Other | Admitting: Podiatrist

## 2021-03-02 ENCOUNTER — Encounter: Payer: Self-pay | Admitting: Podiatrist

## 2021-03-02 ENCOUNTER — Other Ambulatory Visit: Payer: Self-pay

## 2021-03-02 DIAGNOSIS — M1991 Primary osteoarthritis, unspecified site: Secondary | ICD-10-CM | POA: Insufficient documentation

## 2021-03-02 DIAGNOSIS — L84 Corns and callosities: Secondary | ICD-10-CM | POA: Diagnosis not present

## 2021-03-02 DIAGNOSIS — B353 Tinea pedis: Secondary | ICD-10-CM | POA: Diagnosis not present

## 2021-03-02 DIAGNOSIS — G63 Polyneuropathy in diseases classified elsewhere: Secondary | ICD-10-CM

## 2021-03-02 DIAGNOSIS — M21622 Bunionette of left foot: Secondary | ICD-10-CM | POA: Diagnosis not present

## 2021-03-02 DIAGNOSIS — Z79899 Other long term (current) drug therapy: Secondary | ICD-10-CM | POA: Insufficient documentation

## 2021-03-02 DIAGNOSIS — E669 Obesity, unspecified: Secondary | ICD-10-CM | POA: Insufficient documentation

## 2021-03-02 MED ORDER — KETOCONAZOLE 2 % EX CREA: TOPICAL_CREAM | CUTANEOUS | 2 refills | Status: AC

## 2021-03-02 NOTE — Progress Notes (Signed)
Chief Complaint  Patient presents with   Callouses    Left sub 5th painful lesion     HPI: Patient is 75 y.o. female who presents today for painful callus submetatarsal 5 of the left foot.  She states it hurts with shoes and without shoes.  It hurts when she is standing on her foot for any period of time.  Patient Active Problem List   Diagnosis Date Noted   Obesity 03/02/2021   Other long term (current) drug therapy 03/02/2021   Primary osteoarthritis 03/02/2021   SOB (shortness of breath) 12/08/2020   Pericardial effusion 12/08/2020   Dry eyes/dry mouth 05/25/2019   Bronchitis, mucopurulent recurrent ([place]) 02/26/2019   Moderate persistent asthma without complication [id number]   Perennial and seasonal allergic rhinitis 02/24/2019   Seasonal and perennial allergic rhinoconjunctivitis 02/24/2019   Primary osteoarthritis of both knees 04/10/2016   Acute chest pain 08/19/2014   Costochondritis 08/19/2014   Lung nodule 08/19/2014   HTN (hypertension) 08/07/2012   HLD (hyperlipidemia) 09/26/2011   Asthma 09/06/2010   Depression 09/06/2010   History of allergy 09/06/2010   Hypothyroidism 09/06/2010   Lower back pain 09/06/2010   Arthritis 09/06/2010   Pain in joint, pelvic region and thigh 09/06/2010   Peripheral neuropathy 09/06/2010   Psoriasis 09/06/2010   Therapeutic drug monitoring 09/06/2010   Tinnitus 09/06/2010   Vitamin B 12 deficiency 09/06/2010   Cervical spondylosis without myelopathy 04/24/2006   Tear of lateral cartilage or meniscus of knee, current 03/13/2006    Current Outpatient Medications on File Prior to Visit  Medication Sig Dispense Refill   albuterol (VENTOLIN HFA) 108 (90 Base) MCG/ACT inhaler Inhale 2 puffs into the lungs every 6 (six) hours as needed for wheezing or shortness of breath. 8 g 6   Azelastine-Fluticasone 137-50 MCG/ACT SUSP Place 1 spray into the nose 2 (two) times daily as needed. 23 g 5   b complex vitamins tablet Take 1 tablet by  mouth daily.     B-D TB SYRINGE 1CC/27GX1/2" 27G X 1/2" 1 ML MISC Inject into the skin once a week.     Benfotiamine 150 MG CAPS 2 capsules     bimatoprost (LATISSE) 0.03 % ophthalmic solution APPLY ONE DROP TO APPLICATOR AND LINE IT TO THE UPPER LASH ONLY AS DIRECTED     budesonide-formoterol (SYMBICORT) 160-4.5 MCG/ACT inhaler 2 puffs     Cholecalciferol (VITAMIN D3) 25 MCG (1000 UT) CAPS Take by mouth.     Cholecalciferol (VITAMIN D3) 50 MCG (2000 UT) capsule 1 capsule     cycloSPORINE (RESTASIS) 0.05 % ophthalmic emulsion 1 drop 2 (two) times daily.     fluticasone-salmeterol (ADVAIR HFA) 230-21 MCG/ACT inhaler Inhale 2 puffs into the lungs 2 (two) times daily.     folic acid (FOLVITE) 1 MG tablet TAKE 1 TABLET BY MOUTH DAILY     gabapentin (NEURONTIN) 800 MG tablet TK 1 T PO EACH NIGHT     gabapentin (NEURONTIN) 800 MG tablet 1 tablet     levothyroxine (SYNTHROID) 100 MCG tablet Take 100 mcg by mouth every morning.     levothyroxine (SYNTHROID) 112 MCG tablet Take by mouth.     loratadine (CLARITIN) 10 MG tablet Take by mouth.     loratadine (CLARITIN) 10 MG tablet 1 tablet     Magnesium 250 MG TABS Take 250 mg by mouth daily.     Methotrexate Sodium (METHOTREXATE, PF,) 50 MG/2ML injection 20 mg once a week.     montelukast (SINGULAIR) 10  MG tablet TAKE 1 TABLET(10 MG) BY MOUTH AT BEDTIME 90 tablet 0   montelukast (SINGULAIR) 10 MG tablet 1 tablet     Probiotic TBEC See admin instructions.     tiotropium (SPIRIVA HANDIHALER) 18 MCG inhalation capsule 1 capsule by inhaling the contents of the capsule using the HandiHaler device     tiZANidine (ZANAFLEX) 2 MG tablet Take 2 mg by mouth at bedtime as needed.     traMADol (ULTRAM) 50 MG tablet Take 50 mg by mouth daily as needed. As Needed     traMADol (ULTRAM) 50 MG tablet 1 tablet as needed     Turmeric 500 MG CAPS Take 1 capsule po qd     Zinc 10 MG LOZG Use as directed in the mouth or throat.     Certolizumab Pegol (CIMZIA [place]) Inject  into the skin.     No current facility-administered medications on file prior to visit.    Allergies  Allergen Reactions   Latex Itching and Rash   Cat Hair Extract Other (See Comments)   Dust Mite Extract Other (See Comments)   Molds & Smuts     Other reaction(s): Unknown   Pollen Extract-Tree Extract [Pollen Extract] Other (See Comments)    Review of Systems No fevers, chills, nausea, muscle aches, no difficulty breathing, no calf pain, no chest pain or shortness of breath.   Physical Exam  GENERAL APPEARANCE: Alert, conversant. Appropriately groomed. No acute distress.   VASCULAR: Pedal pulses palpable DP and PT bilateral.  Capillary refill time is immediate to all digits,  Proximal to distal cooling it warm to warm.  Digital perfusion adequate.   NEUROLOGIC: sensation is reduced to 5.07 monofilament at 3/5 sites bilateral.  Light touch is intact bilateral, vibratory sensation increased bilateral  MUSCULOSKELETAL: acceptable muscle strength, tone and stability bilateral.  Tailor's bunion deformity noted bilateral.  Flexible hammertoes also noted bilateral.  Prominent fifth metatarsal head is seen left.  DERMATOLOGIC: skin is warm, supple, and dry.  Porokeratotic lesion submetatarsal head 5 left is noted a core is seen with a groundglass appearance noted.  She also has an area of peeling skin on the medial aspect of the left arch which appears to be fungal in nature   Assessment   1. Tinea pedis of left foot   2. Callus of foot   3. Tailor's bunion of left foot   4. Polyneuropathy associated with underlying disease [facility])      Plan  Discussed exam findings with the patient.  I recommended paring the lesion and utilizing padding to offload the area.  The patient agreed and this was accomplished today with a 15 blade without complication.  Also called in some ketoconazole to use on the instep of the left foot.  She will call if the callus returns and will be seen back as  needed.

## 2021-03-06 ENCOUNTER — Other Ambulatory Visit: Payer: Self-pay | Admitting: Allergy

## 2021-03-08 ENCOUNTER — Other Ambulatory Visit: Payer: Self-pay | Admitting: Allergy

## 2021-03-08 NOTE — Telephone Encounter (Signed)
Courtesy refill for montelukast sent to [facility] x 1 with no refills. Called and spoke with patient to let her know she needed an OV. Per patient, she will call back when she can look at her calender and make an appointment.

## 2021-03-10 ENCOUNTER — Ambulatory Visit: Payer: Medicare Other | Admitting: Cardiology

## 2021-03-10 ENCOUNTER — Other Ambulatory Visit: Payer: Self-pay | Admitting: Allergy

## 2021-03-19 ENCOUNTER — Other Ambulatory Visit: Payer: Self-pay | Admitting: Allergy

## 2021-03-27 ENCOUNTER — Ambulatory Visit: Payer: Medicare Other | Admitting: Family

## 2021-04-03 ENCOUNTER — Ambulatory Visit (INDEPENDENT_AMBULATORY_CARE_PROVIDER_SITE_OTHER): Payer: Medicare Other | Admitting: Allergy & Immunology

## 2021-04-03 ENCOUNTER — Encounter: Payer: Self-pay | Admitting: Allergy & Immunology

## 2021-04-03 ENCOUNTER — Other Ambulatory Visit: Payer: Self-pay

## 2021-04-03 VITALS — BP 140/72 | HR 73 | Temp 98.0°F | Resp 18 | Ht 68.0 in | Wt 229.6 lb

## 2021-04-03 DIAGNOSIS — J3089 Other allergic rhinitis: Secondary | ICD-10-CM

## 2021-04-03 DIAGNOSIS — J4540 Moderate persistent asthma, uncomplicated: Secondary | ICD-10-CM

## 2021-04-03 DIAGNOSIS — J302 Other seasonal allergic rhinitis: Secondary | ICD-10-CM | POA: Diagnosis not present

## 2021-04-03 NOTE — Progress Notes (Signed)
FOLLOW UP  Date of Service/Encounter:  04/03/21   Assessment:   Moderate persistent asthma, uncomplicated - not well controlled with an IgE level of 318  Seasonal perennial allergic rhinitis - was on allergen immunotherapy, but stopped due to large local reactions  Plan/Recommendations:    1. Moderate persistent asthma, uncomplicated - Lung testing looked largely normal.  - You do qualify for Xolair (information provided and Consent signed). - [name] [name] will run the numbers to see how much it will cost, but most seniors pay nothing out pocket. - This binds to your allergy antibodies and can help prevent asthma exacerbations.  - Daily controller medication(s): Singulair 10mg  daily and Advair 230/61mcg two puffs twice daily with spacer - Prior to physical activity: albuterol 2 puffs 10-15 minutes before physical activity. - Rescue medications: albuterol 4 puffs every 4-6 hours as needed - Asthma control goals:  * Full participation in all desired activities (may need albuterol before activity) * Albuterol use two time or less a week on average (not counting use with activity) * Cough interfering with sleep two time or less a month * Oral steroids no more than once a year * No hospitalizations  2. Seasonal and perennial allergic rhinitis - Continue with Claritin or Allegra once daily. - Continue with Singulair (montelukast) 10mg  daily - We can hold off on shots for now.  3. Return in about 3 months (around 07/04/2021).     Subjective:   [name] [name] is a 75 y.o. female presenting today for follow up of  Chief Complaint  Patient presents with   Asthma    6 mth f/u    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/02/2021   SOB (shortness of breath) 12/08/2020   Pericardial effusion 12/08/2020   Dry  eyes/dry mouth 05/25/2019   Bronchitis, mucopurulent recurrent ([place]) 02/26/2019   Moderate persistent asthma without complication 97/07/6376   Perennial and seasonal allergic rhinitis 02/24/2019   Seasonal and perennial allergic rhinoconjunctivitis 02/24/2019   Primary osteoarthritis of both knees 04/10/2016   Acute chest pain 08/19/2014   Costochondritis 08/19/2014   Lung nodule 08/19/2014   HTN (hypertension) 08/07/2012   HLD (hyperlipidemia) 09/26/2011   Asthma 09/06/2010   Depression 09/06/2010   History of allergy 09/06/2010   Hypothyroidism 09/06/2010   Lower back pain 09/06/2010   Arthritis 09/06/2010   Pain in joint, pelvic region and thigh 09/06/2010   Peripheral neuropathy 09/06/2010   Psoriasis 09/06/2010   Therapeutic drug monitoring 09/06/2010   Tinnitus 09/06/2010   Vitamin B 12 deficiency 09/06/2010   Cervical spondylosis without myelopathy 04/24/2006   Tear of lateral cartilage or meniscus of knee, current 03/13/2006    History obtained from: chart review and patient.  [name] [name] is a 75 y.o. female presenting for a follow up visit.  She was last seen in March 2022.  At that time, her asthma was controlled with Flovent 172mcg two puffs in the morning and Symbicort 177mcg two puffs at night. [name] [name] changed hre to Symbicort 66mcg two puffs BID instead to simplify her regimen. For her rhinitis, she was continued on Pataday as needed and Dymista BID.   In the interim, she was seen by [name] [name] (Pulmonology).  She had a serum IgE and a CBC with differential sent indication need to start a biologic.  Her IgE  was 318.  Her absolute eosinophil count was 0.  She was also changed at some point Advair 230 mcg 2 puffs twice daily. This seemed to be more of a coverage issues. She cannot tell a difference between the two of them with regards to her control.   Asthma/Respiratory Symptom History: She tells me that her breathing issues got bad in June when she was visiting her son in  [place]. She developed some chest pain and took a baby ASA in the airport. She went to see her PCP and an EKG and enzymes were normal. 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 She has been on Spiriva but never felt that it did anything at all.  Because of this terrifying experience, she is wondering if there are other options to control her breathing and help her to avoid these issues.   Allergic Rhinitis Symptom History: She is currently on Claritin in the morning and sometimes she will switch to Allegra. She also takes the fluticasone. She was on allergy shots but she had large local reactions.  This is when she lived in [place]. She is not interested in getting these large local reactions. She also had allergy shots here with [name] [name] before she had moved to [place].   Otherwise, there have been no changes to her past medical history, surgical history, family history, or social history.    Review of Systems  Constitutional: Negative.  Negative for chills, fever, malaise/fatigue and weight loss.  HENT:  Positive for congestion. Negative for ear discharge, ear pain and sinus pain.   Eyes:  Negative for pain, discharge and redness.  Respiratory:  Negative for cough, sputum production, shortness of breath and wheezing.   Cardiovascular: Negative.  Negative for chest pain and palpitations.  Gastrointestinal:  Negative for abdominal pain, constipation, diarrhea, heartburn, nausea and vomiting.  Skin: Negative.  Negative for itching and rash.  Neurological:  Negative for dizziness and headaches.  Endo/Heme/Allergies:  Positive for environmental allergies. Does not bruise/bleed easily.      Objective:   Blood pressure 140/72, pulse 73, temperature 98 F (36.7 C), resp. rate 18, height 5\' 8"  (1.727 m), weight 229 lb 9.6 oz (104.1 kg), SpO2 94 %. Body mass  index is 34.91 kg/m.   Physical Exam:  Physical Exam Vitals reviewed.  Constitutional:      Appearance: She is well-developed.     Comments: Pleasant female. Cooperative with the exam.   HENT:     Head: Normocephalic and atraumatic.     Right Ear: Tympanic membrane, ear canal and external ear normal.     Left Ear: Tympanic membrane, ear canal and external ear normal.     Nose: No nasal deformity, septal deviation, mucosal edema or rhinorrhea.     Right Turbinates: Enlarged, swollen and pale.     Left Turbinates: Enlarged, swollen and pale.     Right Sinus: No maxillary sinus tenderness or frontal sinus tenderness.     Left Sinus: No maxillary sinus tenderness or frontal sinus tenderness.     Mouth/Throat:     Mouth: Mucous membranes are not pale and not dry.     Pharynx: Uvula midline.  Eyes:     General: Lids are normal. No allergic shiner.       Right eye: No discharge.        Left eye: No  discharge.     Conjunctiva/sclera: Conjunctivae normal.     Right eye: Right conjunctiva is not injected. No chemosis.    Left eye: Left conjunctiva is not injected. No chemosis.    Pupils: Pupils are equal, round, and reactive to light.  Cardiovascular:     Rate and Rhythm: Normal rate and regular rhythm.     Heart sounds: Normal heart sounds.  Pulmonary:     Effort: Pulmonary effort is normal. No tachypnea, accessory muscle usage or respiratory distress.     Breath sounds: Normal breath sounds. No wheezing, rhonchi or rales.     Comments: Moving air well in all lung fields. No increased work of breathing noted.  Chest:     Chest wall: No tenderness.  Lymphadenopathy:     Cervical: No cervical adenopathy.  Skin:    Coloration: Skin is not pale.     Findings: No abrasion, erythema, petechiae or rash. Rash is not papular, urticarial or vesicular.  Neurological:     Mental Status: She is alert.  Psychiatric:        Behavior: Behavior is cooperative.     Diagnostic studies:    Spirometry: results normal (FEV1: 1.56/65%, FVC: 2.29/73%, FEV1/FVC: 68%).    Spirometry consistent with normal pattern. Numbers slightly lower than before, but she is feeling symptomatically well and her numbers are technically normal.    Allergy Studies: none        Salvatore Marvel, MD  [facility]

## 2021-04-03 NOTE — Patient Instructions (Addendum)
1. Moderate persistent asthma, uncomplicated - Lung testing looked largely normal.  - You do qualify for Xolair (information provided and Consent signed). - [name] [name] will run the numbers to see how much it will cost, but most seniors pay nothing out pocket. - This binds to your allergy antibodies and can help prevent asthma exacerbations.  - Daily controller medication(s): Singulair 10mg  daily and Advair 230/5mcg two puffs twice daily with spacer - Prior to physical activity: albuterol 2 puffs 10-15 minutes before physical activity. - Rescue medications: albuterol 4 puffs every 4-6 hours as needed - Asthma control goals:  * Full participation in all desired activities (may need albuterol before activity) * Albuterol use two time or less a week on average (not counting use with activity) * Cough interfering with sleep two time or less a month * Oral steroids no more than once a year * No hospitalizations  2. Seasonal and perennial allergic rhinitis - Continue with Claritin or Allegra once daily. - Continue with Singulair (montelukast) 10mg  daily - We can hold off on shots for now.  3. Return in about 3 months (around 07/04/2021).    Please inform us of any Emergency Department visits, hospitalizations, or changes in symptoms. Call us before going to the ED for breathing or allergy symptoms since we might be able to fit you in for a sick visit. Feel free to contact us anytime with any questions, problems, or concerns.  It was a pleasure to meet you today!  Websites that have reliable patient information: 1. American Academy of Asthma, Allergy, and Immunology: www.aaaai.org 2. Food Allergy Research and Education (FARE): foodallergy.org 3. Mothers of Asthmatics: http://www.asthmacommunitynetwork.org 4. American College of Allergy, Asthma, and Immunology: www.acaai.org   COVID-19 Vaccine Information can be found at:  ShippingScam.co.uk For questions related to vaccine distribution or appointments, please email vaccine@ [email or web address] or call [phone].   We realize that you might be concerned about having an allergic reaction to the COVID19 vaccines. To help with that concern, WE ARE OFFERING THE COVID19 VACCINES IN OUR OFFICE! Ask the front desk for dates!     "Like" [place] on Facebook and Instagram for our latest updates!      A healthy democracy works best when [employer] participate! Make sure you are registered to vote! If you have moved or changed any of your contact information, you will need to get this updated before voting!  In some cases, you MAY be able to register to vote online: [email or web address]

## 2021-04-06 ENCOUNTER — Encounter: Payer: Self-pay | Admitting: Allergy & Immunology

## 2021-04-06 ENCOUNTER — Telehealth: Payer: Self-pay | Admitting: *Deleted

## 2021-04-06 NOTE — Telephone Encounter (Signed)
Awesome - thank you!   Salvatore Marvel, MD [facility]

## 2021-04-06 NOTE — Telephone Encounter (Signed)
-----   Message from Valentina Shaggy, MD sent at 04/06/2021  7:50 AM EDT ----- Oneal Deputy - new start Xolair for asthma. IgE 318.

## 2021-04-06 NOTE — Telephone Encounter (Signed)
Called patient and advised that with her Ins MCR/supplement we could buy and bill and $0 out of pocket for patient. Scheduled appt to start therapy which would be dosing at 375mg  every 14 days. Patient agreed to same and appt scheduled to start

## 2021-04-07 DIAGNOSIS — J4550 Severe persistent asthma, uncomplicated: Secondary | ICD-10-CM | POA: Diagnosis not present

## 2021-04-10 ENCOUNTER — Ambulatory Visit (INDEPENDENT_AMBULATORY_CARE_PROVIDER_SITE_OTHER): Payer: Medicare Other

## 2021-04-10 ENCOUNTER — Ambulatory Visit: Payer: Medicare Other | Admitting: Podiatry

## 2021-04-10 ENCOUNTER — Other Ambulatory Visit: Payer: Self-pay

## 2021-04-10 DIAGNOSIS — J4550 Severe persistent asthma, uncomplicated: Secondary | ICD-10-CM

## 2021-04-10 MED ORDER — OMALIZUMAB 150 MG ~~LOC~~ SOLR
375.0000 mg | SUBCUTANEOUS | Status: DC
  Administered 2021-04-10 – 2021-04-24 (×2): 375 mg via SUBCUTANEOUS

## 2021-04-10 MED ORDER — EPINEPHRINE 0.3 MG/0.3ML IJ SOAJ: 0.3000 mg | Freq: Once | INTRAMUSCULAR | 1 refills | Status: AC

## 2021-04-10 NOTE — Progress Notes (Signed)
Patient came in today and started her Xolair 375mg .  Patient received three injections in total. She got two in the left arm and one in the right arm. Patient stated that she did not have an epipen when we were discussing the protocols. I sent in an epipen to her pharmacy. Patient waited in the larger lobby for thirty minutes with out a issue. Patient was given an emergency action plan as well as injection room hours. Patient was scheduled for her next appointment.

## 2021-04-21 DIAGNOSIS — J4550 Severe persistent asthma, uncomplicated: Secondary | ICD-10-CM | POA: Diagnosis not present

## 2021-04-24 ENCOUNTER — Ambulatory Visit (INDEPENDENT_AMBULATORY_CARE_PROVIDER_SITE_OTHER): Payer: Medicare Other | Admitting: *Deleted

## 2021-04-24 ENCOUNTER — Other Ambulatory Visit: Payer: Self-pay

## 2021-04-24 DIAGNOSIS — J4550 Severe persistent asthma, uncomplicated: Secondary | ICD-10-CM

## 2021-04-24 MED ORDER — OMALIZUMAB 150 MG/ML ~~LOC~~ SOSY
375.0000 mg | PREFILLED_SYRINGE | SUBCUTANEOUS | Status: DC
  Administered 2021-04-24 – 2021-05-15 (×2): 375 mg via SUBCUTANEOUS

## 2021-05-08 ENCOUNTER — Ambulatory Visit: Payer: Medicare Other

## 2021-05-10 DIAGNOSIS — J4540 Moderate persistent asthma, uncomplicated: Secondary | ICD-10-CM

## 2021-05-15 ENCOUNTER — Other Ambulatory Visit: Payer: Self-pay

## 2021-05-15 ENCOUNTER — Ambulatory Visit (INDEPENDENT_AMBULATORY_CARE_PROVIDER_SITE_OTHER): Payer: Medicare Other

## 2021-05-15 DIAGNOSIS — J4540 Moderate persistent asthma, uncomplicated: Secondary | ICD-10-CM

## 2021-05-24 ENCOUNTER — Other Ambulatory Visit: Payer: Self-pay | Admitting: Allergy

## 2021-05-29 ENCOUNTER — Ambulatory Visit: Payer: Medicare Other

## 2021-07-04 ENCOUNTER — Ambulatory Visit: Payer: Medicare Other | Admitting: Allergy & Immunology

## 2021-07-25 ENCOUNTER — Ambulatory Visit (INDEPENDENT_AMBULATORY_CARE_PROVIDER_SITE_OTHER): Payer: Medicare Other | Admitting: Physician Assistant

## 2021-07-25 ENCOUNTER — Encounter: Payer: Self-pay | Admitting: Physician Assistant

## 2021-07-25 VITALS — BP 132/78 | HR 74 | Ht 68.0 in | Wt 230.0 lb

## 2021-07-25 DIAGNOSIS — R142 Eructation: Secondary | ICD-10-CM | POA: Diagnosis not present

## 2021-07-25 DIAGNOSIS — Z86010 Personal history of colonic polyps: Secondary | ICD-10-CM

## 2021-07-25 DIAGNOSIS — R194 Change in bowel habit: Secondary | ICD-10-CM

## 2021-07-25 DIAGNOSIS — K30 Functional dyspepsia: Secondary | ICD-10-CM

## 2021-07-25 MED ORDER — FAMOTIDINE 40 MG PO TABS: 40.0000 mg | ORAL_TABLET | Freq: Every day | ORAL | 5 refills | Status: DC

## 2021-07-25 NOTE — Progress Notes (Signed)
Chief Complaint: Abdominal pain  HPI:    [name] [name] is a 76 year old female, assigned to Dr. Tarri Glenn, with a past medical history as listed below including psoriatic arthritis, who was referred to me by [name], Fransico Him, MD for a complaint of abdominal pain.    11/26/2018 colonoscopy in [facility] with a 3-6 mm polyp in the transverse colon, 5 mm polyp in the descending colon, severe diverticulosis of the sigmoid colon and a 5 mm polyp in the sigmoid colon as well as internal hemorrhoids.  Repeat recommended in 3 years.    10/20/2020 patient seen in clinic by me for a positive Hemoccult test.  At that time discussed options with the patient and she wanted to wait until next June for colonoscopy when it was due.    06/17/2021 patient seen in the ER at the [facility] clinic in [place] for diverticulitis she had a CT of the abdomen pelvis with contrast which showed acute sigmoid diverticulitis.  Also had a right upper quadrant ultrasound which showed a masslike solid-appearing structure in the farrier lateral right kidney and recommended dedicated contrasted CT or MRI.  Patient was given Augmentin.    Today, the patient presents to clinic and tells me that she had a strange episode of diverticulitis when she was in [place] recently.  It actually presented with "morning sickness/nausea", and a lot of indigestion and gas.  She thought something may be wrong with her gallbladder which is why they did an ultrasound initially but everything was normal.  They did not did a CT and she was diagnosed with sigmoid diverticulitis and given Augmentin.  She took the full regimen of this and finished her medicines about 2 to 3 weeks ago now.  She does feel better but has continued with a small sensation of nausea and indigestion and gas.  She is taking Pantoprazole 40 mg as needed as prescribed by a physician down in [place].  She was told not to take this on a daily basis because it could hurt her kidneys  and she has a lot of other medicines which are processed through her kidneys.  It does help slightly when she takes it.    Also discusses her constipation.  Apparently she is swaying towards this over the past few months and uses Citrucel fiber supplementation as well as stool softeners.  This seems to help.    Denies fever, chills, weight loss, blood in her stool, abdominal pain or symptoms that awaken her from sleep.     Past Medical History:  Diagnosis Date   Asthma    Hypothyroid    Psoriatic arthritis ([place])     Past Surgical History:  Procedure Laterality Date   HIP SURGERY Bilateral    TONSILLECTOMY     TUBAL LIGATION      Current Outpatient Medications  Medication Sig Dispense Refill   albuterol (VENTOLIN HFA) 108 (90 Base) MCG/ACT inhaler Inhale 2 puffs into the lungs every 6 (six) hours as needed for wheezing or shortness of breath. 8 g 6   Azelastine-Fluticasone 137-50 MCG/ACT SUSP Place 1 spray into the nose 2 (two) times daily as needed. 23 g 5   b complex vitamins tablet Take 1 tablet by mouth daily.     B-D TB SYRINGE 1CC/27GX1/2" 27G X 1/2" 1 ML MISC Inject into the skin once a week.     bimatoprost (LATISSE) 0.03 % ophthalmic solution APPLY ONE DROP TO APPLICATOR AND LINE IT TO THE UPPER LASH ONLY AS  DIRECTED     Cholecalciferol (VITAMIN D3) 25 MCG (1000 UT) CAPS Take by mouth.     cycloSPORINE (RESTASIS) 0.05 % ophthalmic emulsion 1 drop 2 (two) times daily.     fluticasone-salmeterol (ADVAIR HFA) 230-21 MCG/ACT inhaler Inhale 2 puffs into the lungs 2 (two) times daily.     folic acid (FOLVITE) 1 MG tablet TAKE 1 TABLET BY MOUTH DAILY     gabapentin (NEURONTIN) 800 MG tablet 1 tablet     Golimumab (SIMPONI ARIA IV) Inject 206.4 mg into the vein as directed. Once every 8 weeks     ketoconazole (NIZORAL) 2 % cream Apply to bottoms of feet for 2-3 weeks 60 g 2   levothyroxine (SYNTHROID) 100 MCG tablet Take 100 mcg by mouth every morning.     loratadine (CLARITIN) 10 MG  tablet 1 tablet     Magnesium 250 MG TABS Take 250 mg by mouth daily.     Methotrexate Sodium (METHOTREXATE, PF,) 50 MG/2ML injection 20 mg once a week.     montelukast (SINGULAIR) 10 MG tablet TAKE 1 TABLET(10 MG) BY MOUTH AT BEDTIME 30 tablet 5   Probiotic TBEC See admin instructions.     traMADol (ULTRAM) 50 MG tablet 1 tablet as needed     Turmeric 500 MG CAPS Take 1 capsule po qd     Zinc 10 MG LOZG Use as directed in the mouth or throat.     Current Facility-Administered Medications  Medication Dose Route Frequency Provider Last Rate Last Admin   omalizumab Arvid Right) injection 375 mg  375 mg Subcutaneous Q14 Days Garnet Sierras, DO   375 mg at 04/24/21 1516   omalizumab Arvid Right) prefilled syringe 375 mg  375 mg Subcutaneous Q14 Days Valentina Shaggy, MD   375 mg at 05/15/21 1017    Allergies as of 07/25/2021 - Review Complete 04/06/2021  Allergen Reaction Noted   Latex Itching and Rash 01/27/2009   Cat hair extract Other (See Comments) 11/29/2020   Dust mite extract Other (See Comments) 11/29/2020   Molds & smuts  11/29/2020   Pollen extract-tree extract [pollen extract] Other (See Comments) 11/29/2020    Family History  Problem Relation Age of Onset   Asthma Mother    Allergic rhinitis Mother    Rheum arthritis Mother    Allergic rhinitis Father    Alzheimer's disease Father    Eczema Brother    Allergic rhinitis Brother     Social History   Socioeconomic History   Marital status: Married    Spouse name: Not on file   Number of children: Not on file   Years of education: Not on file   Highest education level: Not on file  Occupational History   Not on file  Tobacco Use   Smoking status: Former    Packs/day: 0.10    Years: 20.00    Pack years: 2.00    Types: Cigarettes    Start date: 28    Quit date: 11/18/2000    Years since quitting: 20.6   Smokeless tobacco: Never   Tobacco comments:    3 cigarettes a day  Vaping Use   Vaping Use: Never used   Substance and Sexual Activity   Alcohol use: Never   Drug use: Never   Sexual activity: Not Currently  Other Topics Concern   Not on file  Social History Narrative   Lives with children   Social Determinants of Health   Financial Resource Strain: Not on file  Food Insecurity: Not on file  Transportation Needs: Not on file  Physical Activity: Not on file  Stress: Not on file  Social Connections: Not on file  Intimate Partner Violence: Not on file    Review of Systems:    Constitutional: No weight loss, fever or chills Cardiovascular: No chest pain Respiratory: No SOB  Gastrointestinal: See HPI and otherwise negative   Physical Exam:  Vital signs: BP 132/78    Pulse 74    Ht 5\' 8"  (1.727 m)    Wt 230 lb (104.3 kg)    SpO2 94%    BMI 34.97 kg/m    Constitutional:   Pleasant elderly Caucasian female appears to be in NAD, Well developed, Well nourished, alert and cooperative Respiratory: Respirations even and unlabored. Lungs clear to auscultation bilaterally.   No wheezes, crackles, or rhonchi.  Cardiovascular: Normal S1, S2. No MRG. Regular rate and rhythm. No peripheral edema, cyanosis or pallor.  Gastrointestinal:  Soft, nondistended, nontender. No rebound or guarding. Normal bowel sounds. No appreciable masses or hepatomegaly. Rectal:  Not performed.  Psychiatric: Oriented to person, place and time. Demonstrates good judgement and reason without abnormal affect or behaviors.  See HPI for recent imaging.  Assessment: 1.  History of adenomatous polyps: Next colonoscopy due June of this year 2.  History of diverticulitis: Recent episode in [place] at the end of December, finished 2 weeks of antibiotics 3.  Eructations/GERD: Increased over the past month and a half, some relief with Pantoprazole as needed; consider gastritis most likely 4.  Change in bowel habits: Towards constipation, helped by a fiber supplement and stool softeners  Plan: 1.  Scheduled patient for  diagnostic EGD and colonoscopy in the [facility] with Dr. Tarri Glenn.  Did provide the patient a detailed list of risks for the procedures and she agrees to proceed. Patient is appropriate for endoscopic procedure(s) in the ambulatory ([facility]) setting.  We will wait at least 3 to 4 weeks to schedule this so that she is far enough out from recent episode of diverticulitis. 2.  Encouraged the patient to use Pepcid 40 mg daily.  Sent prescription for #30 with 5 refills.  Explained that this is not as harsh on her kidneys as Pantoprazole and something that she could use daily if this is helpful. 3.  Patient already has Suprep and she was instructed how to use it today. 4.  Patient told me she is going to call her insurance to see what her cost would be for procedures.  She will call back if she chooses to cancel. 5.  Patient to follow in clinic per recommendations after time of procedures.  [name] Newer, PA-C [facility] 07/25/2021, 3:22 PM  Cc: Haywood Pao, MD

## 2021-07-25 NOTE — Progress Notes (Signed)
Reviewed and agree with management plans. ? ? L. , MD, MPH  ?

## 2021-07-25 NOTE — Patient Instructions (Signed)
MEDICATION: We have sent the following medication to your pharmacy for you to pick up at your convenience: Pepcid 40 MG once a day.  PROCEDURES: You have been scheduled for an EGD and Colonoscopy. Please follow the written instructions given to you at your visit today. You already have the Suprep at home. If you use inhalers (even only as needed), please bring them with you on the day of your procedure.        BMI:  If you are age 74 or older, your body mass index should be between 23-30. Your Body mass index is 34.97 kg/m. If this is out of the aforementioned range listed, please consider follow up with your Primary Care Provider.  If you are age 27 or younger, your body mass index should be between 19-25. Your Body mass index is 34.97 kg/m. If this is out of the aformentioned range listed, please consider follow up with your Primary Care Provider.   MY CHART:  The [facility] providers would like to encourage you to use [facility] to communicate with providers for non-urgent requests or questions.  Due to long hold times on the telephone, sending your provider a message by [facility] may be a faster and more efficient way to get a response.  Please allow 48 business hours for a response.  Please remember that this is for non-urgent requests.   Thank you for trusting me with your gastrointestinal care!    Ellouise Newer, [place]

## 2021-07-27 ENCOUNTER — Other Ambulatory Visit: Payer: Self-pay

## 2021-07-27 ENCOUNTER — Ambulatory Visit (INDEPENDENT_AMBULATORY_CARE_PROVIDER_SITE_OTHER): Payer: Medicare Other | Admitting: Allergy & Immunology

## 2021-07-27 ENCOUNTER — Encounter: Payer: Self-pay | Admitting: Allergy & Immunology

## 2021-07-27 VITALS — BP 112/50 | HR 100 | Temp 97.2°F | Resp 16 | Ht 68.0 in | Wt 219.2 lb

## 2021-07-27 DIAGNOSIS — J3089 Other allergic rhinitis: Secondary | ICD-10-CM

## 2021-07-27 DIAGNOSIS — J302 Other seasonal allergic rhinitis: Secondary | ICD-10-CM

## 2021-07-27 DIAGNOSIS — J4540 Moderate persistent asthma, uncomplicated: Secondary | ICD-10-CM

## 2021-07-27 DIAGNOSIS — H1013 Acute atopic conjunctivitis, bilateral: Secondary | ICD-10-CM

## 2021-07-27 MED ORDER — BUDESONIDE-FORMOTEROL FUMARATE 80-4.5 MCG/ACT IN AERO: 2.0000 | INHALATION_SPRAY | Freq: Two times a day (BID) | RESPIRATORY_TRACT | 5 refills | Status: DC

## 2021-07-27 MED ORDER — FLUTICASONE PROPIONATE HFA 110 MCG/ACT IN AERO: 2.0000 | INHALATION_SPRAY | Freq: Two times a day (BID) | RESPIRATORY_TRACT | 12 refills | Status: DC | PRN

## 2021-07-27 MED ORDER — AZELASTINE-FLUTICASONE 137-50 MCG/ACT NA SUSP: 1.0000 | Freq: Two times a day (BID) | NASAL | 5 refills | Status: DC | PRN

## 2021-07-27 NOTE — Patient Instructions (Addendum)
1. Moderate persistent asthma, uncomplicated - Lung testing looked largely normal.  - I think we can hold off on the biologic for now and avoid any of the injectable medications for asthma control. - If the every day inhaler is working well, then there is no need to do anything more aggressive. - Daily controller medication(s): Singulair 10mg  daily and Symbicort 80/4.43mcg two puffs twice daily with spacer - CHANGES IN ALLERGEN HEAVY ENVIRONMENTS: Add on Flovent 132mcg two puffs twice daily (in addition to the Symbicort) - Prior to physical activity: albuterol 2 puffs 10-15 minutes before physical activity. - Rescue medications: albuterol 4 puffs every 4-6 hours as needed - Asthma control goals:  * Full participation in all desired activities (may need albuterol before activity) * Albuterol use two time or less a week on average (not counting use with activity) * Cough interfering with sleep two time or less a month * Oral steroids no more than once a year * No hospitalizations  2. Seasonal and perennial allergic rhinitis - Continue with Claritin or Allegra once daily. - Continue with Singulair (montelukast) 10mg  daily - Continue with Dymista two sprays per nostril twice daily as needed.   3. Return in about 3 months (around 10/24/2021).    Please inform us of any Emergency Department visits, hospitalizations, or changes in symptoms. Call us before going to the ED for breathing or allergy symptoms since we might be able to fit you in for a sick visit. Feel free to contact us anytime with any questions, problems, or concerns.  It was a pleasure to meet you today!  Websites that have reliable patient information: 1. American Academy of Asthma, Allergy, and Immunology: www.aaaai.org 2. Food Allergy Research and Education (FARE): foodallergy.org 3. Mothers of Asthmatics: http://www.asthmacommunitynetwork.org 4. American College of Allergy, Asthma, and Immunology: www.acaai.org   COVID-19  Vaccine Information can be found at: [email or web address] For questions related to vaccine distribution or appointments, please email [email or web address] or call [phone].   We realize that you might be concerned about having an allergic reaction to the COVID19 vaccines. To help with that concern, WE ARE OFFERING THE COVID19 VACCINES IN OUR OFFICE! Ask the front desk for dates!     Like [place] on [employer] and Instagram for our latest updates!      A healthy democracy works best when [employer] participate! Make sure you are registered to vote! If you have moved or changed any of your contact information, you will need to get this updated before voting!  In some cases, you MAY be able to register to vote online: [email or web address]

## 2021-07-27 NOTE — Progress Notes (Signed)
FOLLOW UP  Date of Service/Encounter:  07/27/21   Assessment:   Moderate persistent asthma, uncomplicated - not well controlled with an IgE level of 318   Seasonal perennial allergic rhinitis - was on allergen immunotherapy, but stopped due to large local reactions  Complicated past medical history including rheumatoid arthritis as well as diverticulitis  Plan/Recommendations:    Patient Instructions  1. Moderate persistent asthma, uncomplicated - Lung testing looked largely normal.  - I think we can hold off on the biologic for now and avoid any of the injectable medications for asthma control. - If the every day inhaler is working well, then there is no need to do anything more aggressive. - Daily controller medication(s): Singulair 10mg  daily and Symbicort 80/4.5mcg two puffs twice daily with spacer - CHANGES IN ALLERGEN HEAVY ENVIRONMENTS: Add on Flovent 170mcg two puffs twice daily (in addition to the Symbicort) - Prior to physical activity: albuterol 2 puffs 10-15 minutes before physical activity. - Rescue medications: albuterol 4 puffs every 4-6 hours as needed - Asthma control goals:  * Full participation in all desired activities (may need albuterol before activity) * Albuterol use two time or less a week on average (not counting use with activity) * Cough interfering with sleep two time or less a month * Oral steroids no more than once a year * No hospitalizations  2. Seasonal and perennial allergic rhinitis - Continue with Claritin or Allegra once daily. - Continue with Singulair (montelukast) 10mg  daily - Continue with Dymista two sprays per nostril twice daily as needed.   3. Return in about 3 months (around 10/24/2021).    Please inform us of any Emergency Department visits, hospitalizations, or changes in symptoms. Call us before going to the ED for breathing or allergy symptoms since we might be able to fit you in for a sick visit. Feel free to contact us  anytime with any questions, problems, or concerns.  It was a pleasure to meet you today!  Websites that have reliable patient information: 1. American Academy of Asthma, Allergy, and Immunology: www.aaaai.org 2. Food Allergy Research and Education (FARE): foodallergy.org 3. Mothers of Asthmatics: http://www.asthmacommunitynetwork.org 4. American College of Allergy, Asthma, and Immunology: www.acaai.org   COVID-19 Vaccine Information can be found at: ShippingScam.co.uk For questions related to vaccine distribution or appointments, please email [email or web address] or call [phone].   We realize that you might be concerned about having an allergic reaction to the COVID19 vaccines. To help with that concern, WE ARE OFFERING THE COVID19 VACCINES IN OUR OFFICE! Ask the front desk for dates!     Like [place] on [employer] and Instagram for our latest updates!      A healthy democracy works best when [employer] participate! Make sure you are registered to vote! If you have moved or changed any of your contact information, you will need to get this updated before voting!  In some cases, you MAY be able to register to vote online: [email or web address]           Subjective:   [name] [name] is a 76 y.o. female presenting today for follow up of  Chief Complaint  Patient presents with   Asthma    Doing pretty good   Follow-up    Clarification on which meds she should be taking. She has not refilled any of her medications in a while.   Allergic Rhinitis     Doing fine just as long as she stays away from things that aggravate it.  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/02/2021   SOB (shortness of breath) 12/08/2020    Pericardial effusion 12/08/2020   Dry eyes/dry mouth 05/25/2019   Bronchitis, mucopurulent recurrent ([place]) 02/26/2019   Moderate persistent asthma without complication 98/33/8250   Perennial and seasonal allergic rhinitis 02/24/2019   Seasonal and perennial allergic rhinoconjunctivitis 02/24/2019   Primary osteoarthritis of both knees 04/10/2016   Acute chest pain 08/19/2014   Costochondritis 08/19/2014   Lung nodule 08/19/2014   HTN (hypertension) 08/07/2012   HLD (hyperlipidemia) 09/26/2011   Asthma 09/06/2010   Depression 09/06/2010   History of allergy 09/06/2010   Hypothyroidism 09/06/2010   Lower back pain 09/06/2010   Arthritis 09/06/2010   Pain in joint, pelvic region and thigh 09/06/2010   Peripheral neuropathy 09/06/2010   Psoriasis 09/06/2010   Therapeutic drug monitoring 09/06/2010   Tinnitus 09/06/2010   Vitamin B 12 deficiency 09/06/2010   Cervical spondylosis without myelopathy 04/24/2006   Tear of lateral cartilage or meniscus of knee, current 03/13/2006    History obtained from: chart review and patient.  [name] [name] is a 76 y.o. female presenting for a follow up visit.  We last saw her in October 2022.  At that time, her lung testing looked fairly normal.  She did qualify for Xolair and made the decision to start that.  We will continue with Singulair 10 mg daily and Advair 230/21 mcg 2 puffs twice daily.  For the allergic rhinitis, would continue with Claritin or Allegra daily in combination with Singulair.  Since the last visit, she has mostly done well.  She recently got back from [place].  They were clearing out a condo they had in [place], [place].  He just became too much for them to keep up.  She is happy to be back up here without worrying about the condo in [place].  Asthma/Respiratory Symptom History: She was changed to Advair due to presumed affordability, but this ws actually mor expensive than the Symbicort.  She received Xolair, but then she stopped it. They  ended up going to [place] for an extended period of time and they missed some injections.  In retrospect, she actually does not like having 3 injections every 2 weeks.  She is wondering if there are any other options.  Specifically, she asked about Nucala, which a friend of hers is on.  I did another review of her labs and she has never had an elevated eosinophil count high enough to get this approved.  She does not seem keen on additional lab work right now.  She is wondering if she could just go back to her old Symbicort dose.  She tells me that Dr. Maudie Mercury or Dr. Verlin Fester decreased her from Symbicort 160 mcg to Symbicort 80 mcg.  She did well on that until she went to [place] and was exposed so much dander and had that reaction in the airport.  Then the pulmonologist changed her to Advair because it seemed that this was going to be cheaper for her.  However, she tells me that the Advair is certainly more expensive than the Symbicort was.  Prior to this episode in June, her asthma has been under good control.  We only added the Xolair because she wanted to be more aggressive with asthma control given how terrified that exacerbation was.  Allergic Rhinitis  Symptom History: She remains on the Singulair as well as an antihistamine.  She is wondering if she can get a nose spray prescription again in case she needs that, which she needs very rarely.  She has not needed antibiotics in quite some time.  Cats are a big trigger for her, which is why she thinks she had such an attack in [place].  She does go up there a couple times a year to visit her 4 grandkids.  She would like a plan to be able to go up there and maintain a good state of health, especially with regards to her breathing.  She has a history of rheumatoid arthritis and follows with Dr. Gavin Pound. She is receiving MTX which she gives to herself. Now she is on Simponi. She was previously on Cimzia but this was not cutting it.   She was recently treated  for diverticulitis flare.  She is talking to her GI doctor about being more aggressive with treatment of this.  She was due for colonoscopy anyway.  Otherwise, there have been no changes to her past medical history, surgical history, family history, or social history.    Review of Systems  Constitutional: Negative.  Negative for chills, fever, malaise/fatigue and weight loss.  HENT:  Positive for congestion. Negative for ear discharge, ear pain and sinus pain.   Eyes:  Negative for pain, discharge and redness.  Respiratory:  Negative for cough, sputum production, shortness of breath and wheezing.   Cardiovascular: Negative.  Negative for chest pain and palpitations.  Gastrointestinal:  Negative for abdominal pain, constipation, diarrhea, heartburn, nausea and vomiting.  Skin: Negative.  Negative for itching and rash.  Neurological:  Negative for dizziness and headaches.  Endo/Heme/Allergies:  Positive for environmental allergies. Does not bruise/bleed easily.      Objective:   Blood pressure (!) 112/50, pulse 100, temperature (!) 97.2 F (36.2 C), temperature source Temporal, resp. rate 16, height 5\' 8"  (1.727 m), weight 219 lb 3.2 oz (99.4 kg), SpO2 96 %. Body mass index is 33.33 kg/m.   Physical Exam:  Physical Exam Vitals reviewed.  Constitutional:      Appearance: She is well-developed.     Comments: Pleasant female. Cooperative with the exam.  Very talkative.  HENT:     Head: Normocephalic and atraumatic.     Right Ear: Tympanic membrane, ear canal and external ear normal.     Left Ear: Tympanic membrane, ear canal and external ear normal.     Nose: No nasal deformity, septal deviation, mucosal edema or rhinorrhea.     Right Turbinates: Enlarged, swollen and pale.     Left Turbinates: Enlarged, swollen and pale.     Right Sinus: No maxillary sinus tenderness or frontal sinus tenderness.     Left Sinus: No maxillary sinus tenderness or frontal sinus tenderness.      Comments: Turbinates are erythematous.  No nasal polyps.    Mouth/Throat:     Mouth: Mucous membranes are not pale and not dry.     Pharynx: Uvula midline.  Eyes:     General: Lids are normal. No allergic shiner.       Right eye: No discharge.        Left eye: No discharge.     Conjunctiva/sclera: Conjunctivae normal.     Right eye: Right conjunctiva is not injected. No chemosis.    Left eye: Left conjunctiva is not injected. No chemosis.    Pupils: Pupils are equal, round, and reactive  to light.  Cardiovascular:     Rate and Rhythm: Normal rate and regular rhythm.     Heart sounds: Normal heart sounds.  Pulmonary:     Effort: Pulmonary effort is normal. No tachypnea, accessory muscle usage or respiratory distress.     Breath sounds: Normal breath sounds. No wheezing, rhonchi or rales.     Comments: Moving air well in all lung fields. No increased work of breathing noted.  Chest:     Chest wall: No tenderness.  Lymphadenopathy:     Cervical: No cervical adenopathy.  Skin:    General: Skin is warm.     Capillary Refill: Capillary refill takes less than 2 seconds.     Coloration: Skin is not pale.     Findings: No abrasion, erythema, petechiae or rash. Rash is not papular, urticarial or vesicular.  Neurological:     Mental Status: She is alert.  Psychiatric:        Behavior: Behavior is cooperative.     Diagnostic studies: none     Salvatore Marvel, MD  [facility]

## 2021-08-14 ENCOUNTER — Encounter: Payer: Self-pay | Admitting: Podiatrist

## 2021-08-14 ENCOUNTER — Other Ambulatory Visit: Payer: Self-pay

## 2021-08-14 ENCOUNTER — Ambulatory Visit (INDEPENDENT_AMBULATORY_CARE_PROVIDER_SITE_OTHER): Payer: Medicare Other | Admitting: Podiatrist

## 2021-08-14 DIAGNOSIS — M21622 Bunionette of left foot: Secondary | ICD-10-CM | POA: Diagnosis not present

## 2021-08-14 DIAGNOSIS — M7752 Other enthesopathy of left foot: Secondary | ICD-10-CM

## 2021-08-14 NOTE — Patient Instructions (Signed)
Try vicks vapo rub on the great toenail. Try it for a couple weeks and you may also file down with a nail file to help reduce the bulk of the nail.

## 2021-08-14 NOTE — Progress Notes (Signed)
Chief Complaint  Patient presents with   Callouses    Left foot 5th toe callus      HPI: Patient is 76 y.o. female who presents today for a callus submetatarsal 5 of the left foot. She states that trimming it away helps but it tends to come back.  She also has pain around the fifth metatarsal head and laterally.     Allergies  Allergen Reactions   Latex Itching and Rash   Cat Hair Extract Other (See Comments)   Dust Mite Extract Other (See Comments)   Molds & Smuts     Other reaction(s): Unknown   Pollen Extract-Tree Extract [Pollen Extract] Other (See Comments)    Review of systems is negative except as noted in the HPI.  Denies nausea/ vomiting/ fevers/ chills or night sweats.   Denies difficulty breathing, denies calf pain or tenderness  Physical Exam  Patient is awake, alert, and oriented x 3.  In no acute distress.    Vascular status is intact with palpable pedal pulses DP and PT bilateral and capillary refill time less than 3 seconds bilateral.  No edema or erythema noted.   Neurological exam reveals epicritic and protective sensation grossly intact bilateral.   Dermatological exam reveals skin is supple and dry to bilateral feet.  No open lesions present.  Callus submetatarsal 5 is noted.  Intractable in nature with a ground glass appearance noted.  Skin tension lines present throughout.   Musculoskeletal exam: Musculature intact with dorsiflexion, plantarflexion, inversion, eversion. Pain around the lateral portion of the fifth metatarsal head is noted with some swelling present as well.      Assessment:   ICD-10-CM   1. Tailor's bunion of left foot  M21.622     2. Bursitis of left foot  M77.52        Plan: Discussed treatment options and alternatives. I recommended trying an injection of steroid to try and reduce the inflammation. Risks and benefits discussed and patient wishes to proceed.  An injection consisting of 4mg  dexamethasone and marcaine plain was  infiltrated lateral to the fifth metatarsal head without complication.  She was given instructions for aftercare and an offloading pad was also applied.  She will call if this fails to relieve her pain or for further debridements of the callus.

## 2021-08-15 MED ORDER — DEXAMETHASONE SODIUM PHOSPHATE 120 MG/30ML IJ SOLN
4.0000 mg | Freq: Once | INTRAMUSCULAR | Status: AC
  Administered 2021-08-14: 4 mg via INTRA_ARTICULAR

## 2021-08-16 HISTORY — PX: COLONOSCOPY: SHX174

## 2021-08-31 ENCOUNTER — Ambulatory Visit (HOSPITAL_BASED_OUTPATIENT_CLINIC_OR_DEPARTMENT_OTHER)
Admission: RE | Admit: 2021-08-31 | Discharge: 2021-08-31 | Disposition: A | Discharge status: Home / Self Care | Payer: Medicare Other | Source: Ambulatory Visit | Attending: Physician Assistant | Admitting: Physician Assistant

## 2021-08-31 ENCOUNTER — Encounter (HOSPITAL_BASED_OUTPATIENT_CLINIC_OR_DEPARTMENT_OTHER): Payer: Self-pay

## 2021-08-31 ENCOUNTER — Other Ambulatory Visit: Payer: Self-pay

## 2021-08-31 ENCOUNTER — Other Ambulatory Visit (INDEPENDENT_AMBULATORY_CARE_PROVIDER_SITE_OTHER): Payer: Medicare Other

## 2021-08-31 DIAGNOSIS — K5732 Diverticulitis of large intestine without perforation or abscess without bleeding: Secondary | ICD-10-CM | POA: Diagnosis not present

## 2021-08-31 LAB — BASIC METABOLIC PANEL
BUN: 12 mg/dL (ref 6–23)
CO2: 27 mEq/L (ref 19–32)
Calcium: 9.6 mg/dL (ref 8.4–10.5)
Chloride: 104 mEq/L (ref 96–112)
Creatinine, Ser: 0.48 mg/dL (ref 0.40–1.20)
GFR: 93.61 mL/min (ref >60.00)
Glucose, Bld: 95 mg/dL (ref 70–99)
Potassium: 4 mEq/L (ref 3.5–5.1)
Sodium: 140 mEq/L (ref 135–145)

## 2021-08-31 MED ORDER — IOHEXOL 300 MG/ML  SOLN
100.0000 mL | Freq: Once | INTRAMUSCULAR | Status: AC | PRN
  Administered 2021-08-31: 100 mL via INTRAVENOUS

## 2021-08-31 NOTE — Telephone Encounter (Signed)
[place],  ?Please call the patient, if she truly believes she is having another episode of diverticulitis we will need to delay her procedures.  We can order a stat CT of the abdomen pelvis with contrast (she may need a BMP) to be completed today or tomorrow, pending results of this we will start her on antibiotics and if she does have diverticulitis we will need to reschedule her procedures.  ?Thanks, JLL  ? ?Called and spoke with patient regarding Jennifer's recommendations. She reports that she has only had some water and a cracker. Pt has been advised not to eat or drink anything else for right now. Pt knows that she will need to come in for lab work today, she is aware that no appt is necessary. She knows that the lab is in the basement of our office building. Pt is aware that I will call her back with CT appt. She knows that antibiotics may be prescribed depending upon CT results. She is also aware that if CT shows diverticulitis we will need to postpone her procedures. Pt verbalized understanding and had no concerns at the end of the call.  ? ?Patient has been scheduled for a STAT CT A/P at [facility] on 08/31/21 at 4 pm. NPO 4 hours prior. Drink 1st bottle of contrast at 2 pm and 2nd bottle at 3 pm. Arrive at [facility] by 3:30 pm. I called pt and provided her with the appt information and instructions. Pt has been advised to stop by the 2nd floor receptionist desk before/after labs so that she can get her contrast and written instructions. Pt verbalized understanding of all information and had no concerns at the end of the call. ?

## 2021-09-01 ENCOUNTER — Other Ambulatory Visit: Payer: Self-pay

## 2021-09-01 MED ORDER — ONDANSETRON 4 MG PO TBDP: ORAL_TABLET | ORAL | 0 refills | Status: DC

## 2021-09-03 ENCOUNTER — Encounter: Payer: Self-pay | Admitting: Certified Registered Nurse Anesthetist

## 2021-09-08 ENCOUNTER — Telehealth: Payer: Self-pay | Admitting: Gastroenterology

## 2021-09-08 ENCOUNTER — Encounter: Payer: Self-pay | Admitting: Gastroenterology

## 2021-09-08 ENCOUNTER — Emergency Department (HOSPITAL_COMMUNITY)
Admission: EM | Admit: 2021-09-08 | Discharge: 2021-09-08 | Disposition: A | Discharge status: Home / Self Care | Payer: Medicare Other | Attending: Emergency Medicine | Admitting: Emergency Medicine

## 2021-09-08 ENCOUNTER — Ambulatory Visit (AMBULATORY_SURGERY_CENTER): Payer: Medicare Other | Admitting: Gastroenterology

## 2021-09-08 ENCOUNTER — Encounter (HOSPITAL_COMMUNITY): Payer: Self-pay | Admitting: *Deleted

## 2021-09-08 ENCOUNTER — Other Ambulatory Visit: Payer: Self-pay

## 2021-09-08 VITALS — BP 125/69 | HR 77 | Temp 98.4°F | Resp 24 | Ht 68.0 in | Wt 219.0 lb

## 2021-09-08 DIAGNOSIS — K209 Esophagitis, unspecified without bleeding: Secondary | ICD-10-CM | POA: Diagnosis not present

## 2021-09-08 DIAGNOSIS — Z79899 Other long term (current) drug therapy: Secondary | ICD-10-CM | POA: Insufficient documentation

## 2021-09-08 DIAGNOSIS — K2970 Gastritis, unspecified, without bleeding: Secondary | ICD-10-CM

## 2021-09-08 DIAGNOSIS — Z86010 Personal history of colonic polyps: Secondary | ICD-10-CM

## 2021-09-08 DIAGNOSIS — D122 Benign neoplasm of ascending colon: Secondary | ICD-10-CM

## 2021-09-08 DIAGNOSIS — K2990 Gastroduodenitis, unspecified, without bleeding: Secondary | ICD-10-CM | POA: Diagnosis not present

## 2021-09-08 DIAGNOSIS — K449 Diaphragmatic hernia without obstruction or gangrene: Secondary | ICD-10-CM

## 2021-09-08 DIAGNOSIS — E039 Hypothyroidism, unspecified: Secondary | ICD-10-CM | POA: Diagnosis not present

## 2021-09-08 DIAGNOSIS — Z91040 Latex allergy status: Secondary | ICD-10-CM | POA: Insufficient documentation

## 2021-09-08 DIAGNOSIS — S81812A Laceration without foreign body, left lower leg, initial encounter: Secondary | ICD-10-CM | POA: Diagnosis not present

## 2021-09-08 DIAGNOSIS — X58XXXA Exposure to other specified factors, initial encounter: Secondary | ICD-10-CM | POA: Insufficient documentation

## 2021-09-08 DIAGNOSIS — R194 Change in bowel habit: Secondary | ICD-10-CM

## 2021-09-08 DIAGNOSIS — J45909 Unspecified asthma, uncomplicated: Secondary | ICD-10-CM | POA: Insufficient documentation

## 2021-09-08 DIAGNOSIS — D124 Benign neoplasm of descending colon: Secondary | ICD-10-CM

## 2021-09-08 DIAGNOSIS — K2980 Duodenitis without bleeding: Secondary | ICD-10-CM | POA: Diagnosis not present

## 2021-09-08 DIAGNOSIS — S8992XA Unspecified injury of left lower leg, initial encounter: Secondary | ICD-10-CM | POA: Diagnosis present

## 2021-09-08 DIAGNOSIS — Z7951 Long term (current) use of inhaled steroids: Secondary | ICD-10-CM | POA: Insufficient documentation

## 2021-09-08 DIAGNOSIS — Y93E6 Activity, residential relocation: Secondary | ICD-10-CM | POA: Insufficient documentation

## 2021-09-08 DIAGNOSIS — K2950 Unspecified chronic gastritis without bleeding: Secondary | ICD-10-CM | POA: Diagnosis not present

## 2021-09-08 MED ORDER — PANTOPRAZOLE SODIUM 40 MG PO TBEC: 40.0000 mg | DELAYED_RELEASE_TABLET | Freq: Two times a day (BID) | ORAL | 0 refills | Status: DC

## 2021-09-08 MED ORDER — SODIUM CHLORIDE 0.9 % IV SOLN: 500.0000 mL | Freq: Once | INTRAVENOUS | Status: DC

## 2021-09-08 MED ORDER — LIDOCAINE-EPINEPHRINE (PF) 2 %-1:200000 IJ SOLN
10.0000 mL | Freq: Once | INTRAMUSCULAR | Status: AC
  Administered 2021-09-08: 10 mL
  Filled 2021-09-08: qty 20

## 2021-09-08 MED ORDER — PANTOPRAZOLE SODIUM 40 MG PO TBEC: 40.0000 mg | DELAYED_RELEASE_TABLET | Freq: Two times a day (BID) | ORAL | 1 refills | Status: DC

## 2021-09-08 NOTE — Patient Instructions (Signed)
Thank you for coming in to see [place] today. ?Resume previous diet  and medications today. ?New prescription for Pantoprazole 40 mg twice per day before meals.  Prescription sent to your pharmacy. ?Avoid all Aspirin, Ibuprofen, Naproxen or other non-steroidal anti-inflammatory medications.  Tylenol is better option for pain and fever. ?Recommend adding a stool-bulking agent such as Metamucil along with at least 64 ounces of water daily. ? ?We will be in contact regarding final results and arrange an office visit for follow up. ? ? ?YOU HAD AN ENDOSCOPIC PROCEDURE TODAY AT [facility]:   Refer to the procedure report that was given to you for any specific questions about what was found during the examination.  If the procedure report does not answer your questions, please call your gastroenterologist to clarify.  If you requested that your care partner not be given the details of your procedure findings, then the procedure report has been included in a sealed envelope for you to review at your convenience later. ? ?YOU SHOULD EXPECT: Some feelings of bloating in the abdomen. Passage of more gas than usual.  Walking can help get rid of the air that was put into your GI tract during the procedure and reduce the bloating. If you had a lower endoscopy (such as a colonoscopy or flexible sigmoidoscopy) you may notice spotting of blood in your stool or on the toilet paper. If you underwent a bowel prep for your procedure, you may not have a normal bowel movement for a few days. ? ?Please Note:  You might notice some irritation and congestion in your nose or some drainage.  This is from the oxygen used during your procedure.  There is no need for concern and it should clear up in a day or so. ? ?SYMPTOMS TO REPORT IMMEDIATELY: ? ?Following lower endoscopy (colonoscopy or flexible sigmoidoscopy): ? Excessive amounts of blood in the stool ? Significant tenderness or worsening of abdominal pains ? Swelling of the  abdomen that is new, acute ? Fever of 100?F or higher ? ?Following upper endoscopy (EGD) ? Vomiting of blood or coffee ground material ? New chest pain or pain under the shoulder blades ? Painful or persistently difficult swallowing ? New shortness of breath ? Fever of 100?F or higher ? Black, tarry-looking stools ? ?For urgent or emergent issues, a gastroenterologist can be reached at any hour by calling [phone]. ?Do not use MyChart messaging for urgent concerns.  ? ? ?DIET:  We do recommend a small meal at first, but then you may proceed to your regular diet.  Drink plenty of fluids but you should avoid alcoholic beverages for 24 hours. ? ?ACTIVITY:  You should plan to take it easy for the rest of today and you should NOT DRIVE or use heavy machinery until tomorrow (because of the sedation medicines used during the test).   ? ?FOLLOW UP: ?Our staff will call the number listed on your records 48-72 hours following your procedure to check on you and address any questions or concerns that you may have regarding the information given to you following your procedure. If we do not reach you, we will leave a message.  We will attempt to reach you two times.  During this call, we will ask if you have developed any symptoms of COVID 19. If you develop any symptoms (ie: fever, flu-like symptoms, shortness of breath, cough etc.) before then, please call [phone].  If you test positive for Covid 19 in the  2 weeks post procedure, please call and report this information to [place].   ? ?If any biopsies were taken you will be contacted by phone or by letter within the next 1-3 weeks.  Please call us at [phone] if you have not heard about the biopsies in 3 weeks.  ? ? ?SIGNATURES/CONFIDENTIALITY: ?You and/or your care partner have signed paperwork which will be entered into your electronic medical record.  These signatures attest to the fact that that the information above on your After Visit Summary has been  reviewed and is understood.  Full responsibility of the confidentiality of this discharge information lies with you and/or your care-partner.  ?

## 2021-09-08 NOTE — Telephone Encounter (Signed)
I called to check on the patient. Call went to voicemail and a I left a brief message.  ?

## 2021-09-08 NOTE — ED Triage Notes (Signed)
Patient presents post colonoscopy. While moving from the table, the patient suffered a skin tear to the left leg in which there was continuous bleeding. While in EMS care, bleeding stopped. Patient also complains of abdominal pain. She is not currently on blood thinners.  ? ?EMS vitals: ?142/96 BP ?69 HR ?98% SPO2 room air ?16 RR ? ? ?

## 2021-09-08 NOTE — Progress Notes (Signed)
[place] pulled back for coloscopy with large skin tear noted on left leg.  EBL 150 cc ?MD aware. vss ?

## 2021-09-08 NOTE — Op Note (Addendum)
[place] ?Patient Name: [name] [name] ?Procedure Date: 09/08/2021 10:36 AM ?MRN: [id number] ?Endoscopist: Thornton Park MD, MD ?Age: 76 ?Referring MD:  ?Date of Birth: [birth year] ?Gender: Female ?Account #: [id number] ?Procedure:                Upper GI endoscopy ?Indications:              Suspected esophageal reflux, Eructation, Nausea ?Medicines:                Monitored Anesthesia Care ?Procedure:                Pre-Anesthesia Assessment: ?                          - Prior to the procedure, a History and Physical  ?                          was performed, and patient medications and  ?                          allergies were reviewed. The patient's tolerance of  ?                          previous anesthesia was also reviewed. The risks  ?                          and benefits of the procedure and the sedation  ?                          options and risks were discussed with the patient.  ?                          All questions were answered, and informed consent  ?                          was obtained. Prior Anticoagulants: The patient has  ?                          taken no previous anticoagulant or antiplatelet  ?                          agents. ASA Grade Assessment: III - A patient with  ?                          severe systemic disease. After reviewing the risks  ?                          and benefits, the patient was deemed in  ?                          satisfactory condition to undergo the procedure. ?                          After obtaining informed consent, the endoscope was  ?  passed under direct vision. Throughout the  ?                          procedure, the patient's blood pressure, pulse, and  ?                          oxygen saturations were monitored continuously. The  ?                          GIF [id number] #[id number] was introduced through the  ?                          mouth, and advanced to the third part of duodenum.  ?                           The upper GI endoscopy was accomplished without  ?                          difficulty. The patient tolerated the procedure  ?                          well. ?Scope In: ?Scope Out: ?Findings:                 LA Grade A (one or more mucosal breaks less than 5  ?                          mm, not extending between tops of 2 mucosal folds)  ?                          esophagitis was found 38 cm from the incisors.  ?                          Biopsies were taken from the distal esophagus with  ?                          a cold forceps for histology. Estimated blood loss  ?                          was minimal. ?                          Moderate inflammation characterized by erythema,  ?                          friability, granularity and linear erosions was  ?                          found in the gastric body and in the gastric  ?                          antrum. Biopsies were taken from the antrum, body,  ?  and fundus with a cold forceps for histology.  ?                          Estimated blood loss was minimal. ?                          Patchy mildly erythematous mucosa without active  ?                          bleeding and with no stigmata of bleeding was found  ?                          in the duodenal bulb. Biopsies were taken with a  ?                          cold forceps for histology. Estimated blood loss  ?                          was minimal. ?                          A medium-sized hiatal hernia was present. ?Complications:            No immediate complications. ?Estimated Blood Loss:     Estimated blood loss was minimal. ?Impression:               - LA Grade A reflux esophagitis. Biopsied. ?                          - Gastritis. Biopsied. ?                          - Erythematous duodenopathy. Biopsied. ?                          - Medium-sized hiatal hernia. ?Recommendation:           - Patient has a contact number available for  ?                          emergencies. The  signs and symptoms of potential  ?                          delayed complications were discussed with the  ?                          patient. Return to normal activities tomorrow.  ?                          Written discharge instructions were provided to the  ?                          patient. ?                          - Resume previous diet. ?                          -  Continue present medications. ?                          - Start pantoprazole 40 mg BID for at least 10  ?                          weeks. ?                          - Await pathology results. ?                          - No aspirin, ibuprofen, naproxen, or other  ?                          non-steroidal anti-inflammatory drugs. ?Thornton Park MD, MD ?09/08/2021 11:16:17 AM ?This report has been signed electronically. ?

## 2021-09-08 NOTE — Progress Notes (Signed)
Report given to PACU, vss 

## 2021-09-08 NOTE — Progress Notes (Signed)
Skin tear evaluated and re-dressed with multiple layers of 4x4s and Coban.  Patient having moderate discomfort with this injury. ?EMS summoned, report given.  Patient transferred to [facility] ED with IV infusing at k[facility]. ? ?

## 2021-09-08 NOTE — Discharge Instructions (Signed)
You can apply antibiotic ointment to the wound or keep a Vaseline gauze dressing over the wound.  Replace the gauze every 1 to 2 days.  1 absorbable suture was placed to help control the bleeding ?

## 2021-09-08 NOTE — Progress Notes (Signed)
? ?Referring Provider: [name], Fransico Him, MD ?Primary Care Physician:  Haywood Pao, MD ? ?Indication for EGD:  Reflux, eructation, nausea ?Indication for Colonoscopy:  Change in bowel habits, history of polyps, recent diverticulitis ? ? ?IMPRESSION:  ?Appropriate candidate for monitored anesthesia care ? ?PLAN: ?EGD and Colonoscopy in the [facility] today ? ? ?HPI: [name] [name] is a 76 y.o. female presents for endoscopic evaluation. She was recently seen in the office by Ellouise Newer, PA. In brief,  ?  11/26/2018 colonoscopy in [facility] with a 3-6 mm polyp in the transverse colon, 5 mm polyp in the descending colon, severe diverticulosis of the sigmoid colon and a 5 mm polyp in the sigmoid colon as well as internal hemorrhoids.  Repeat recommended in 3 years. ?   10/20/2020 patient seen in clinic for a positive Hemoccult test.  At that time discussed options with the patient and she wanted to wait until next June for colonoscopy when it was due. ?   06/17/2021 patient seen in the ER at the [facility] clinic in [place] for diverticulitis she had a CT of the abdomen pelvis with contrast which showed acute sigmoid diverticulitis.  Also had a right upper quadrant ultrasound which showed a masslike solid-appearing structure in the farrier lateral right kidney and recommended dedicated contrasted CT or MRI.  Patient was given Augmentin. ?   More recently had "morning sickness/nausea", and a lot of indigestion and gas.  She is taking Pantoprazole 40 mg as needed as prescribed by a physician down in [place].  She was told not to take this on a daily basis because it could hurt her kidneys and she has a lot of other medicines which are processed through her kidneys.  It does help slightly when she takes it. ?   Also has chronic constipation.  Apparently she is swaying towards this over the past few months and uses Citrucel fiber supplementation as well as stool softeners.  This seems to help. ? ? ? ?Past  Medical History:  ?Diagnosis Date  ? Asthma   ? Diverticulitis   ? Hypothyroid   ? Psoriatic arthritis ([place])   ? ? ?Past Surgical History:  ?Procedure Laterality Date  ? HIP SURGERY Bilateral   ? TONSILLECTOMY    ? TUBAL LIGATION    ? ? ?Current Outpatient Medications  ?Medication Sig Dispense Refill  ? albuterol (VENTOLIN HFA) 108 (90 Base) MCG/ACT inhaler Inhale 2 puffs into the lungs every 6 (six) hours as needed for wheezing or shortness of breath. 8 g 6  ? Azelastine-Fluticasone 137-50 MCG/ACT SUSP Place 1 spray into the nose 2 (two) times daily as needed. 23 g 5  ? b complex vitamins tablet Take 1 tablet by mouth daily.    ? budesonide-formoterol (SYMBICORT) 80-4.5 MCG/ACT inhaler Inhale 2 puffs into the lungs in the morning and at bedtime. 1 each 5  ? Cholecalciferol (VITAMIN D3) 25 MCG (1000 UT) CAPS Take by mouth.    ? cycloSPORINE (RESTASIS) 0.05 % ophthalmic emulsion 1 drop 2 (two) times daily.    ? famotidine (PEPCID) 40 MG tablet Take 1 tablet (40 mg total) by mouth daily. 30 tablet 5  ? fluticasone (FLOVENT HFA) 110 MCG/ACT inhaler Inhale 2 puffs into the lungs 2 (two) times daily as needed. Add this when you are in an allergen heavy environment. 1 each 12  ? folic acid (FOLVITE) 1 MG tablet TAKE 1 TABLET BY MOUTH DAILY    ? gabapentin (NEURONTIN) 800 MG tablet  1 tablet    ? Golimumab (SIMPONI ARIA IV) Inject 206.4 mg into the vein as directed. Once every 8 weeks    ? ketoconazole (NIZORAL) 2 % cream Apply to bottoms of feet for 2-3 weeks 60 g 2  ? levothyroxine (SYNTHROID) 100 MCG tablet Take 100 mcg by mouth every morning.    ? loratadine (CLARITIN) 10 MG tablet 1 tablet    ? Magnesium 250 MG TABS Take 250 mg by mouth daily.    ? Methotrexate Sodium (METHOTREXATE, PF,) 50 MG/2ML injection 20 mg once a week.    ? methylcellulose oral powder Take by mouth in the morning and at bedtime.    ? montelukast (SINGULAIR) 10 MG tablet TAKE 1 TABLET(10 MG) BY MOUTH AT BEDTIME 30 tablet 5  ? omalizumab (XOLAIR)  150 MG injection 150 mg See admin instructions.    ? ondansetron (ZOFRAN-ODT) 4 MG disintegrating tablet Take 1 tablet (4 mg total) by mouth every 4-6 hours as needed for nausea/vomiting. 30 tablet 0  ? Probiotic TBEC See admin instructions.    ? traMADol (ULTRAM) 50 MG tablet 1 tablet as needed    ? Turmeric 500 MG CAPS Take 1 capsule po qd    ? Zinc 10 MG LOZG Use as directed in the mouth or throat.    ? ?Current Facility-Administered Medications  ?Medication Dose Route Frequency Provider Last Rate Last Admin  ? omalizumab Arvid Right) injection 375 mg  375 mg Subcutaneous Q14 Days Garnet Sierras, DO   375 mg at 04/24/21 1516  ? omalizumab Arvid Right) prefilled syringe 375 mg  375 mg Subcutaneous Q14 Days Valentina Shaggy, MD   375 mg at 05/15/21 1017  ? ? ?Allergies as of 09/08/2021 - Review Complete 09/03/2021  ?Allergen Reaction Noted  ? Latex Itching and Rash 01/27/2009  ? Cat hair extract Other (See Comments) 11/29/2020  ? Dust mite extract Other (See Comments) 11/29/2020  ? Molds & smuts  11/29/2020  ? Pollen extract-tree extract [pollen extract] Other (See Comments) 11/29/2020  ? ? ?Family History  ?Problem Relation Age of Onset  ? Asthma Mother   ? Allergic rhinitis Mother   ? Rheum arthritis Mother   ? Allergic rhinitis Father   ? Alzheimer's disease Father   ? Eczema Brother   ? Allergic rhinitis Brother   ? ? ? ?Physical Exam: ?General:   Alert,  well-nourished, pleasant and cooperative in NAD ?Head:  Normocephalic and atraumatic. ?Eyes:  Sclera clear, no icterus.   Conjunctiva pink. ?Mouth:  No deformity or lesions.   ?Neck:  Supple; no masses or thyromegaly. ?Lungs:  Clear throughout to auscultation.   No wheezes. ?Heart:  Regular rate and rhythm; no murmurs. ?Abdomen:  Soft, non-tender, nondistended, normal bowel sounds, no rebound or guarding.  ?Msk:  Symmetrical. No boney deformities ?LAD: No inguinal or umbilical LAD ?Extremities:  No clubbing or edema. ?Neurologic:  Alert and  oriented x4;  grossly  nonfocal ?Skin:  No obvious rash or bruise. ?Psych:  Alert and cooperative. Normal mood and affect. ? ? ? ? ?Studies/Results: ?No results found. ? ? ? ? L. Tarri Glenn, MD, MPH ?09/08/2021, 10:01 AM ? ? ? ?  ?

## 2021-09-08 NOTE — Progress Notes (Signed)
Pt's states no medical or surgical changes since previsit or office visit. 

## 2021-09-08 NOTE — ED Provider Notes (Signed)
?[facility] DEPT ?Provider Note ? ? ?CSN: [id number] ?Arrival date & time: 09/08/21  1150 ? ?  ? ?History ? ?Chief complaint: Skin tear ? ?[name] [name] is a 76 y.o. female. ? ?HPI ?Patient has history of asthma, hypothyroidism, psoriatic arthritis and diverticulitis ?Patient states she had a colonoscopy procedure today.  Patient states that some point her leg was injured as she was being transferred.  Patient developed a skin tear and they were unable to control the bleeding.  Patient was sent to the ED for further evaluation.  Patient does not know exactly how it occurred.  No report was provided from the facility or providers who performed the procedure. ? ?Patient denies any other complaints other than having some abdominal bloating after the colonoscopy ? ?Home Medications ?Prior to Admission medications   ?Medication Sig Start Date End Date Taking? Authorizing Provider  ?albuterol (VENTOLIN HFA) 108 (90 Base) MCG/ACT inhaler Inhale 2 puffs into the lungs every 6 (six) hours as needed for wheezing or shortness of breath. 12/02/20   Spero Geralds, MD  ?Azelastine-Fluticasone [phone] MCG/ACT SUSP Place 1 spray into the nose 2 (two) times daily as needed. 07/27/21   Valentina Shaggy, MD  ?b complex vitamins tablet Take 1 tablet by mouth daily.    [provider]  ?budesonide-formoterol (SYMBICORT) 80-4.5 MCG/ACT inhaler Inhale 2 puffs into the lungs in the morning and at bedtime. 07/27/21   Valentina Shaggy, MD  ?Cholecalciferol (VITAMIN D3) 25 MCG (1000 UT) CAPS Take by mouth. 04/24/11   [provider]  ?cycloSPORINE (RESTASIS) 0.05 % ophthalmic emulsion 1 drop 2 (two) times daily.    [provider]  ?famotidine (PEPCID) 40 MG tablet Take 1 tablet (40 mg total) by mouth daily. 07/25/21   Levin Erp, PA  ?fluticasone (FLOVENT HFA) 110 MCG/ACT inhaler Inhale 2 puffs into the lungs 2 (two) times daily as needed. Add this when you are in  an allergen heavy environment. ?Patient not taking: Reported on 09/08/2021 07/27/21   Valentina Shaggy, MD  ?folic acid (FOLVITE) 1 MG tablet TAKE 1 TABLET BY MOUTH DAILY 03/13/06   [provider]  ?gabapentin (NEURONTIN) 800 MG tablet 1 tablet    [provider]  ?Golimumab (Alpine ARIA IV) Inject 206.4 mg into the vein as directed. Once every 8 weeks    [provider]  ?ketoconazole (NIZORAL) 2 % cream Apply to bottoms of feet for 2-3 weeks 03/02/21   Bronson Ing, DPM  ?levothyroxine (SYNTHROID) 100 MCG tablet Take 100 mcg by mouth every morning. 02/20/21   [provider]  ?loratadine (CLARITIN) 10 MG tablet 1 tablet    [provider]  ?Magnesium 250 MG TABS Take 250 mg by mouth daily.    [provider]  ?Methotrexate Sodium (METHOTREXATE, PF,) 50 MG/2ML injection 20 mg once a week. 12/30/20   [provider]  ?methylcellulose oral powder Take by mouth in the morning and at bedtime.    [provider]  ?montelukast (SINGULAIR) 10 MG tablet TAKE 1 TABLET(10 MG) BY MOUTH AT BEDTIME 05/24/21   Valentina Shaggy, MD  ?omalizumab Arvid Right) 150 MG injection 150 mg See admin instructions.    [provider]  ?ondansetron (ZOFRAN-ODT) 4 MG disintegrating tablet Take 1 tablet (4 mg total) by mouth every 4-6 hours as needed for nausea/vomiting. 09/01/21   Levin Erp, PA  ?pantoprazole (PROTONIX) 40 MG tablet Take 1 tablet (40 mg total) by mouth  2 (two) times daily before a meal. 09/08/21   Thornton Park, MD  ?Probiotic TBEC See admin instructions.    [provider]  ?traMADol (ULTRAM) 50 MG tablet 1 tablet as needed ?Patient not taking: Reported on 09/08/2021 01/26/20   [provider]  ?Turmeric 500 MG CAPS Take 1 capsule po qd 02/17/19   [provider]  ?Zinc 10 MG LOZG Use as directed in the mouth or throat.    [provider]  ?   ? ?Allergies    ?Latex, Cat hair extract, Dust mite  extract, Molds & smuts, and Pollen extract-tree extract [pollen extract]   ? ?Review of Systems   ?Review of Systems  ?Constitutional:  Negative for fever.  ? ?Physical Exam ?Updated Vital Signs ?BP (!) 161/70 (BP Location: Right Arm)   Pulse 76   Temp 98.2 ?F (36.8 ?C) (Oral)   Resp 18   Ht 1.727 m ('5\' 8"'$ )   Wt 99.8 kg   SpO2 99%   BMI 33.45 kg/m?  ?Physical Exam ?Vitals and nursing note reviewed.  ?Constitutional:   ?   General: She is not in acute distress. ?   Appearance: She is well-developed.  ?HENT:  ?   Head: Normocephalic and atraumatic.  ?   Right Ear: External ear normal.  ?   Left Ear: External ear normal.  ?Eyes:  ?   General: No scleral icterus.    ?   Right eye: No discharge.     ?   Left eye: No discharge.  ?   Conjunctiva/sclera: Conjunctivae normal.  ?Neck:  ?   Trachea: No tracheal deviation.  ?Cardiovascular:  ?   Rate and Rhythm: Normal rate.  ?Pulmonary:  ?   Effort: Pulmonary effort is normal. No respiratory distress.  ?   Breath sounds: No stridor.  ?Abdominal:  ?   General: There is no distension.  ?Musculoskeletal:     ?   General: No swelling or deformity.  ?   Cervical back: Neck supple.  ?Skin: ?   General: Skin is warm and dry.  ?   Findings: No rash.  ?   Comments: Superficial skin tear left lower extremity, mid anterior lower leg, active bleeding from a superficial varicose vein  ?Neurological:  ?   Mental Status: She is alert.  ?   Cranial Nerves: Cranial nerve deficit: no gross deficits.  ? ? ?ED Results / Procedures / Treatments   ?Labs ?(all labs ordered are listed, but only abnormal results are displayed) ?Labs Reviewed - No data to display ? ?EKG ?None ? ?Radiology ?No results found. ? ?Procedures ?Marland Kitchen.Laceration Repair ? ?Date/Time: 09/08/2021 12:36 PM ?Performed by: Dorie Rank, MD ?Authorized by: Dorie Rank, MD  ? ?Consent:  ?  Consent obtained:  Verbal ?  Risks discussed:  Infection, pain and poor wound healing ?Anesthesia:  ?  Anesthesia method:  Local infiltration ?   Local anesthetic:  Lidocaine 2% WITH epi ?Laceration details:  ?  Location:  Leg ?  Length (cm):  5 ?Pre-procedure details:  ?  Preparation:  Patient was prepped and draped in usual sterile fashion ?Exploration:  ?  Hemostasis achieved with:  Tied off vessels (1 for figure-of-eight suture) ?Comments:  ?   4-0 Vicryl used for figure-of-eight suture to control the varicose vein bleeding.  Superficial skin tear edges reapproximated, nonadhesive dressing applied  ? ? ?Medications Ordered in ED ?Medications  ?lidocaine-EPINEPHrine (XYLOCAINE W/EPI) 2 %-1:200000 (PF) injection 10 mL (10 mLs Infiltration Given  09/08/21 1213)  ? ? ?ED Course/ Medical Decision Making/ A&P ?  ?                        ?Medical Decision Making ?Risk ?Prescription drug management. ? ?Patient with a superficial skin tear during An outpatient procedure today.  The skin tear was overlying a varicose vein that continued to bleed.  Hemostasis achieved with figure-of-eight suture and pressure dressing. ? ? ? ? ? ? ? ?Final Clinical Impression(s) / ED Diagnoses ?Final diagnoses:  ?Laceration of left lower extremity, initial encounter  ? ? ?Rx / DC Orders ?ED Discharge Orders   ? ? None  ? ?  ? ? ?  ?Dorie Rank, MD ?09/08/21 1238 ? ?

## 2021-09-08 NOTE — Progress Notes (Signed)
1038 Robinul 0.1 mg IV given due large amount of secretions upon assessment.  MD made aware, vss  ?

## 2021-09-08 NOTE — Op Note (Addendum)
[facility] ?Patient Name: Niylah Hassan ?Procedure Date: 09/08/2021 10:27 AM ?MRN: [id number] ?Endoscopist: Thornton Park MD, MD ?Age: 76 ?Referring MD:  ?Date of Birth: 07-01-1945 ?Gender: Female ?Account #: [id number] ?Procedure:                Colonoscopy ?Indications:              High risk colon cancer surveillance: Personal  ?                          history of colonic polyps, Incidental - Change in  ?                          bowel habits ?                          3 polyps removed on colonoscopy in [place] in 2020  ?                          with surveillance recommended in 3 years ?                          Recent, recurrent diverticulitis ?Medicines:                Monitored Anesthesia Care ?Procedure:                Pre-Anesthesia Assessment: ?                          - Prior to the procedure, a History and Physical  ?                          was performed, and patient medications and  ?                          allergies were reviewed. The patient's tolerance of  ?                          previous anesthesia was also reviewed. The risks  ?                          and benefits of the procedure and the sedation  ?                          options and risks were discussed with the patient.  ?                          All questions were answered, and informed consent  ?                          was obtained. Prior Anticoagulants: The patient has  ?                          taken no previous anticoagulant or antiplatelet  ?  agents. ASA Grade Assessment: III - A patient with  ?                          severe systemic disease. After reviewing the risks  ?                          and benefits, the patient was deemed in  ?                          satisfactory condition to undergo the procedure. ?                          After obtaining informed consent, the colonoscope  ?                          was passed under direct vision. Throughout the  ?                           procedure, the patient's blood pressure, pulse, and  ?                          oxygen saturations were monitored continuously. The  ?                          CF [id number] #[id number] was introduced through the anus  ?                          and advanced to the the cecum, identified by  ?                          appendiceal orifice and ileocecal valve. The  ?                          colonoscopy was performed with moderate difficulty  ?                          due to poor bowel prep. The patient tolerated the  ?                          procedure well. The quality of the bowel  ?                          preparation was poor. The ileocecal valve,  ?                          appendiceal orifice, and rectum were photographed. ?Scope In: 10:55:32 AM ?Scope Out: 11:09:14 AM ?Scope Withdrawal Time: 0 hours 9 minutes 55 seconds  ?Total Procedure Duration: 0 hours 13 minutes 42 seconds  ?Findings:                 Non-bleeding external and internal hemorrhoids were  ?                          found. ?  Multiple small and large-mouthed diverticula were  ?                          found in the entire colon. ?                          A moderate amount of semi-liquid semi-solid solid  ?                          stool was found in the entire colon, interfering  ?                          with visualization. Small, medium, and flat polyps  ?                          could not be seen during this procedure. ?                          Three sessile polyps were found in the descending  ?                          colon. The polyps were 3 to 4 mm in size. These  ?                          polyps were removed with a cold snare. Resection  ?                          and retrieval were complete. Estimated blood loss  ?                          was minimal. ?                          Two sessile polyps were found in the ascending  ?                          colon. The polyps were 2 to 3 mm in size. These  ?                           polyps were removed with a cold snare. Resection  ?                          and retrieval were complete. Estimated blood loss  ?                          was minimal. ?                          The exam was otherwise without abnormality on  ?                          direct and retroflexion views. ?Complications:            Bleeding from a skin tear to the left leg  occurred  ?                          during the procedure. No injury was witnessed but  ?                          it seems like she may have hit her leg on the bed.  ?                          Despite application for pressure and placing the  ?                          patient in Trendelenburg, the bleeding did not stop. ?Estimated Blood Loss:     Estimated blood loss: 150 mL. ?Impression:               - Preparation of the colon was poor. ?                          - Non-bleeding external and internal hemorrhoids. ?                          - Diverticulosis in the entire examined colon. ?                          - Stool in the entire examined colon. ?                          - Three 3 to 4 mm polyps in the descending colon,  ?                          removed with a cold snare. Resected and retrieved. ?                          - Two 2 to 3 mm polyps in the ascending colon,  ?                          removed with a cold snare. Resected and retrieved. ?                          - The examination was otherwise normal on direct  ?                          and retroflexion views. ?                          - Skin tear occurred to the left leg during the  ?                          procedure and we were unable to stop the bleeding  ?                          during the procedure. ?Recommendation:           - Patient has  a contact number available for  ?                          emergencies. The signs and symptoms of potential  ?                          delayed complications were discussed with the  ?                           patient. Return to normal activities tomorrow.  ?                          Written discharge instructions were provided to the  ?                          patient. ?                          - Follow a high fiber diet. Drink at least 64  ?                          ounces of water daily. Add a daily stool bulking  ?                          agent such as psyllium (an exampled would be  ?                          Metamucil). ?                          - Continue present medications. ?                          - Await pathology results. ?                          - Repeat colonoscopy at the next available  ?                          appointment because the bowel preparation was poor.  ?                          Use a two day bowel prep at that time. ?                          - Please go the ER because the skin tear that  ?                          occurred during the procedure has not stopped  ?                          bleeding. ?                          - Emerging evidence supports eating a diet of  ?  fruits, vegetables, grains, calcium, and yogurt  ?                          while reducing red meat and alcohol may reduce the  ?                          risk of colon cancer. ?Thornton Park MD, MD ?09/08/2021 11:26:34 AM ?This report has been signed electronically. ?

## 2021-09-08 NOTE — Progress Notes (Signed)
Called to room to assist during endoscopic procedure.  Patient ID and intended procedure confirmed with present staff. Received instructions for my participation in the procedure from the performing physician.  

## 2021-09-10 ENCOUNTER — Other Ambulatory Visit: Payer: Self-pay | Admitting: Gastroenterology

## 2021-09-10 DIAGNOSIS — K209 Esophagitis, unspecified without bleeding: Secondary | ICD-10-CM

## 2021-09-12 ENCOUNTER — Telehealth: Payer: Self-pay | Admitting: *Deleted

## 2021-09-12 NOTE — Telephone Encounter (Signed)
?  Follow up Call- ? ? ?  09/08/2021  ? 10:14 AM  ?Call back number  ?Post procedure Call Back phone  # [phone]  ?Permission to leave phone message Yes  ?  ? ?Patient questions:  ? ?Do you have a fever, pain , or abdominal swelling? No. ?Pain Score  0 * ? ?Have you tolerated food without any problems? Yes.   ? ?Have you been able to return to your normal activities? Yes.   ? ?Do you have any questions about your discharge instructions: ?Diet   No. ?Medications  No. ?Follow up visit  No. ? ?Do you have questions or concerns about your Care? No. ? ?Actions: ?* If pain score is 4 or above: ?No action needed, pain <4. Pt had  a skin tear ,while here in Endoscopy last Friday,for which she was sent to the hospital . Pt says she saw her PCP today about this ans still has dressing on leg and that her PCP says this will take a long time to heal.Pt says leg is not infected. Pt denies having any other concerns or problems from the EGD/Colonoscopy procedure . ? ? ?

## 2021-09-14 ENCOUNTER — Encounter: Payer: Self-pay | Admitting: Gastroenterology

## 2021-10-23 NOTE — Progress Notes (Signed)
? ?Referring Provider: [name], Fransico Him, MD ?Primary Care Physician:  Haywood Pao, MD ? ? ?Chief complaint: Follow-up after endoscopy ? ? ?IMPRESSION:  ?History of colon polyps ?   -3 tubular adenomas on colonoscopy in [place], surveillance recommended in 3 years ?   -5 tubular adenomas removed on colonoscopy 09/08/2021 ?   -Repeat colonoscopy recommended with a 2-day bowel prep given the incomplete evaluation 08/2021 ?Reflux esophagitis, gastritis, and duodenitis on recent EGD ?Recent colonoscopy procedure complicated by leg laceration, now healing ?History of sigmoid diverticulitis ?   - reports 3 prior episodes, most recently 12/22 occurring while in [place] ?Recent change in bowel habits towards constipation ? ?PLAN: ?- Colonoscopy with 2 day bowel prep at her convenience ?- Taper of pantoprazole as symptoms will allow ?- Follow a high fiber diet or use fiber supplements on a regular basis. ?- There is no need to avoid seeds, nuts, corn, and berries. ?- Non-steroidal antiinflammatory medications (such as ibuprofen, naproxen, etc) may increase your risk for a recurrent of diverticulitis. Please avoid these medications whenever possible.   ?- See patient instructions for additional recommendations  ? ? ?HPI: [name] [name] is a 76 y.o. female who returns in follow-up after endoscopic evaluation of abdominal pain.  This is my first office visit with [name] [name]. Summary of her care includes: ? ?11/26/2018 colonoscopy in [facility] with a 3-6 mm polyp in the transverse colon, 5 mm polyp in the descending colon, severe diverticulosis of the sigmoid colon and a 5 mm polyp in the sigmoid colon as well as internal hemorrhoids.  Repeat recommended in 3 years. ? ?10/20/2020 patient seen in clinic by Ellouise Newer for a positive Hemoccult test.  At that time discussed options with the patient and she wanted to wait until next June for colonoscopy when it was due. ? ?06/17/2021 patient seen  in the ER at the [facility] clinic in [place] for diverticulitis she had a CT of the abdomen pelvis with contrast which showed acute sigmoid diverticulitis.  Also had a right upper quadrant ultrasound which showed a masslike solid-appearing structure in the farrier lateral right kidney and recommended dedicated contrasted CT or MRI.  Patient was given Augmentin. ? ?07/25/21 office visit with Ellouise Newer where she reported a trange episode of diverticulitis when she was recently in [place].  It actually presented with "morning sickness/nausea", and a lot of indigestion and gas.  She thought something may be wrong with her gallbladder which is why they did an ultrasound initially but everything was normal. They did not do a CT and she was diagnosed with sigmoid diverticulitis and completed 2 weeks of Augmentin.  She does feel better but has continued with a small sensation of nausea and indigestion and gas.  She is taking Pantoprazole 40 mg as needed as prescribed by a physician down in [place].  She was told not to take this on a daily basis because it could hurt her kidneys and she has a lot of other medicines which are processed through her kidneys.  It does help slightly when she takes it. Also discussed her constipation.  Apparently she is swaying towards this over the past few months and uses Citrucel fiber supplementation as well as stool softeners.  This seems to help. ? ?09/08/21 EGD showed LA grade a reflux esophagitis, gastritis, erythematous duodenopathy, and a medium sized hiatal hernia.  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  Gastric biopsy showed mild chronic gastritis without H. pylori  or intestinal metaplasia.  Esophageal biopsies showed reflux without Barrett's esophagus or eosinophilic esophagitis. ? ?09/08/21 Colonoscopy was limited due to a poor prep.  Nonbleeding internal/external hemorrhoids were seen, pancolonic diverticulosis, and 5 small tubular adenomas were  removed 2 from the ascending colon and 3 from the descending colon.  The procedure was complicated by a skin tear occurring to the left leg.  The skin tear occurred overlying a varicose vein that continued to bleed.  She had to go to the emergency room where hemostasis was achieved with a figure-of-eight suture and pressure dressing. Repeat colonoscopy with 2-day bowel prep recommended. All 5 polyps were tubular adenomas.  ? ?She returns today in follow-up. Her leg is slowly healing. Heartburn is better controlled on pantoprazole. Pharmacist has her concerned about the risks of PPI with her other medications.  She has rare nausea. ? ?Tried Metamucil but this resulted in severe constipation.  Now she is using Activa, magnesium, stool softener, and has increased the fruits and vegetables in her diet.  She does not use any NSAIDs. ? ?Past Medical History:  ?Diagnosis Date  ? Asthma   ? Diverticulitis   ? Hypothyroid   ? Psoriatic arthritis ([place])   ? ? ?Past Surgical History:  ?Procedure Laterality Date  ? HIP SURGERY Bilateral   ? TONSILLECTOMY    ? TUBAL LIGATION    ? ? ?Current Outpatient Medications  ?Medication Sig Dispense Refill  ? albuterol (VENTOLIN HFA) 108 (90 Base) MCG/ACT inhaler Inhale 2 puffs into the lungs every 6 (six) hours as needed for wheezing or shortness of breath. 8 g 6  ? Azelastine-Fluticasone 137-50 MCG/ACT SUSP Place 1 spray into the nose 2 (two) times daily as needed. 23 g 5  ? b complex vitamins tablet Take 1 tablet by mouth daily.    ? budesonide-formoterol (SYMBICORT) 80-4.5 MCG/ACT inhaler Inhale 2 puffs into the lungs in the morning and at bedtime. 1 each 5  ? Cholecalciferol (VITAMIN D3) 25 MCG (1000 UT) CAPS Take by mouth.    ? cycloSPORINE (RESTASIS) 0.05 % ophthalmic emulsion 1 drop 2 (two) times daily.    ? fluticasone (FLOVENT HFA) 110 MCG/ACT inhaler Inhale 2 puffs into the lungs 2 (two) times daily as needed. Add this when you are in an allergen heavy environment. 1 each 12  ?  folic acid (FOLVITE) 1 MG tablet TAKE 1 TABLET BY MOUTH DAILY    ? gabapentin (NEURONTIN) 800 MG tablet 1 tablet    ? Golimumab (SIMPONI ARIA IV) Inject 206.4 mg into the vein as directed. Once every 8 weeks    ? ketoconazole (NIZORAL) 2 % cream Apply to bottoms of feet for 2-3 weeks 60 g 2  ? levothyroxine (SYNTHROID) 100 MCG tablet Take 100 mcg by mouth every morning.    ? loratadine (CLARITIN) 10 MG tablet 1 tablet    ? Magnesium 250 MG TABS Take 250 mg by mouth daily.    ? Methotrexate Sodium (METHOTREXATE, PF,) 50 MG/2ML injection 20 mg once a week.    ? methylcellulose oral powder Take by mouth in the morning and at bedtime.    ? montelukast (SINGULAIR) 10 MG tablet TAKE 1 TABLET(10 MG) BY MOUTH AT BEDTIME 30 tablet 5  ? Na Sulfate-K Sulfate-Mg Sulf 17.5-3.13-1.6 GM/177ML SOLN Take 1 kit by mouth once for 1 dose. 354 mL 0  ? pantoprazole (PROTONIX) 40 MG tablet TAKE 1 TABLET(40 MG) BY MOUTH TWICE DAILY BEFORE A MEAL 180 tablet 0  ? Probiotic TBEC  See admin instructions.    ? traMADol (ULTRAM) 50 MG tablet     ? Turmeric 500 MG CAPS Take 1 capsule po qd    ? Zinc 10 MG LOZG Use as directed in the mouth or throat.    ? ?Current Facility-Administered Medications  ?Medication Dose Route Frequency Provider Last Rate Last Admin  ? 0.9 %  sodium chloride infusion  500 mL Intravenous Once Thornton Park, MD      ? omalizumab Arvid Right) injection 375 mg  375 mg Subcutaneous Q14 Days Garnet Sierras, DO   375 mg at 04/24/21 1516  ? omalizumab Arvid Right) prefilled syringe 375 mg  375 mg Subcutaneous Q14 Days Valentina Shaggy, MD   375 mg at 05/15/21 1017  ? ? ?Allergies as of 10/24/2021 - Review Complete 10/24/2021  ?Allergen Reaction Noted  ? Latex Itching and Rash 01/27/2009  ? Cat hair extract Other (See Comments) 11/29/2020  ? Dust mite extract Other (See Comments) 11/29/2020  ? Molds & smuts  11/29/2020  ? Pollen extract-tree extract [pollen extract] Other (See Comments) 11/29/2020  ? ? ? ?Physical Exam: ?General:    Alert,  well-nourished, pleasant and cooperative in NAD ?Head:  Normocephalic and atraumatic. ?Eyes:  Sclera clear, no icterus.   Conjunctiva pink. ?Abdomen:  Soft, nontender, nondistended, normal bowel so

## 2021-10-24 ENCOUNTER — Encounter: Payer: Self-pay | Admitting: Gastroenterology

## 2021-10-24 ENCOUNTER — Ambulatory Visit (INDEPENDENT_AMBULATORY_CARE_PROVIDER_SITE_OTHER): Payer: Medicare Other | Admitting: Gastroenterology

## 2021-10-24 VITALS — BP 134/70 | HR 80 | Ht 68.0 in | Wt 213.0 lb

## 2021-10-24 DIAGNOSIS — K2990 Gastroduodenitis, unspecified, without bleeding: Secondary | ICD-10-CM | POA: Diagnosis not present

## 2021-10-24 DIAGNOSIS — K209 Esophagitis, unspecified without bleeding: Secondary | ICD-10-CM

## 2021-10-24 DIAGNOSIS — K2970 Gastritis, unspecified, without bleeding: Secondary | ICD-10-CM

## 2021-10-24 DIAGNOSIS — Z86010 Personal history of colonic polyps: Secondary | ICD-10-CM

## 2021-10-24 MED ORDER — NA SULFATE-K SULFATE-MG SULF 17.5-3.13-1.6 GM/177ML PO SOLN: 1.0000 | Freq: Once | ORAL | 0 refills | Status: AC

## 2021-10-24 NOTE — Patient Instructions (Addendum)
It was my pleasure to provide care to you today. I am so thankful that your leg is healing up so nicely. Based on our discussion, I am providing you with my recommendations below: ? ?RECOMMENDATION(S):  ? ?In general, pantoprazole is considered a safe medicine. But, I do not want you to take any medications that you don't need. I do not recommend that you start these medications suddenly.  I recommend reducing the pantoprazole to 40 mg every morning for one week. If you remain asymptomatic, reduce the pantoprazole to every other morning for one week prior to discontinuing the medication all together. taper off the drugs. ? ?Given your history of diverticulitis, please continue to follow a high fiber diet. There is no need to avoid seeds, nuts, corn, and berries. Non-steroidal antiinflammatory medications (such as ibuprofen, naproxen, etc) may increase your risk for a recurrent of diverticulitis. Please avoid these medications whenever possible.   ? ?Given your history of polyps and the retained stool at the time of your last colonoscopy, I recommend another colonoscopy with a two day bowel prep.  ? ?FOLLOW UP: ? ?After your procedure, you will receive a call from my office staff regarding my recommendation for follow up. ? ?BMI: ? ?If you are age 74 or older, your body mass index should be between 23-30. Your Body mass index is 32.39 kg/m?Marland Kitchen If this is out of the aforementioned range listed, please consider follow up with your Primary Care Provider. ? ?MY CHART: ? ?The [facility] providers would like to encourage you to use [facility] to communicate with providers for non-urgent requests or questions.  Due to long hold times on the telephone, sending your provider a message by [facility] may be a faster and more efficient way to get a response.  Please allow 48 business hours for a response.  Please remember that this is for non-urgent requests.  ? ?Thank you for trusting me with your gastrointestinal care!   ? ?Thornton Park, MD, MPH ? ?

## 2021-10-26 ENCOUNTER — Encounter: Payer: Self-pay | Admitting: Allergy & Immunology

## 2021-10-26 ENCOUNTER — Ambulatory Visit (INDEPENDENT_AMBULATORY_CARE_PROVIDER_SITE_OTHER): Payer: Medicare Other | Admitting: Allergy & Immunology

## 2021-10-26 ENCOUNTER — Telehealth: Payer: Self-pay

## 2021-10-26 VITALS — BP 132/60 | HR 69 | Temp 97.7°F | Resp 20 | Ht 68.0 in | Wt 213.0 lb

## 2021-10-26 DIAGNOSIS — J302 Other seasonal allergic rhinitis: Secondary | ICD-10-CM | POA: Diagnosis not present

## 2021-10-26 DIAGNOSIS — H1013 Acute atopic conjunctivitis, bilateral: Secondary | ICD-10-CM

## 2021-10-26 DIAGNOSIS — J4540 Moderate persistent asthma, uncomplicated: Secondary | ICD-10-CM | POA: Diagnosis not present

## 2021-10-26 DIAGNOSIS — H9311 Tinnitus, right ear: Secondary | ICD-10-CM

## 2021-10-26 MED ORDER — AZELASTINE-FLUTICASONE 137-50 MCG/ACT NA SUSP: 1.0000 | Freq: Two times a day (BID) | NASAL | 5 refills | Status: DC | PRN

## 2021-10-26 MED ORDER — BUDESONIDE-FORMOTEROL FUMARATE 80-4.5 MCG/ACT IN AERO: 2.0000 | INHALATION_SPRAY | Freq: Two times a day (BID) | RESPIRATORY_TRACT | 5 refills | Status: DC

## 2021-10-26 MED ORDER — FLUTICASONE PROPIONATE HFA 110 MCG/ACT IN AERO: 2.0000 | INHALATION_SPRAY | Freq: Two times a day (BID) | RESPIRATORY_TRACT | 5 refills | Status: DC | PRN

## 2021-10-26 MED ORDER — MONTELUKAST SODIUM 10 MG PO TABS: ORAL_TABLET | ORAL | 5 refills | Status: DC

## 2021-10-26 NOTE — Progress Notes (Signed)
? ?FOLLOW UP ? ?Date of Service/Encounter:  10/26/21 ? ? ?Assessment:  ? ?Moderate persistent asthma, uncomplicated - previously on Xolair, but now stable without it ?  ?Seasonal perennial allergic rhinitis - was on allergen immunotherapy, but stopped due to large local reactions ? ?Tinnitus - referring to Dr. Fredric Dine  ?  ?Complicated past medical history including rheumatoid arthritis as well as diverticulitis ? ?Plan/Recommendations:  ? ?1. Moderate persistent asthma, uncomplicated ?- Lung testing looked largely normal.  ?- New spacer and demonstration provided. ?- Use this spacer with the Symbicort AND the Flovent.  ?- Daily controller medication(s): Singulair '10mg'$  daily and Symbicort 80/4.76mg two puffs twice daily with spacer ?- CHANGES IN ALLERGEN HEAVY ENVIRONMENTS: Add on Flovent 1155m two puffs twice daily (in addition to the Symbicort) ?- Prior to physical activity: albuterol 2 puffs 10-15 minutes before physical activity. ?- Rescue medications: albuterol 4 puffs every 4-6 hours as needed ?- Asthma control goals:  ?* Full participation in all desired activities (may need albuterol before activity) ?* Albuterol use two time or less a week on average (not counting use with activity) ?* Cough interfering with sleep two time or less a month ?* Oral steroids no more than once a year ?* No hospitalizations ? ?2. Seasonal and perennial allergic rhinitis ?- Continue with Claritin or Allegra once daily. ?- Continue with Singulair (montelukast) '10mg'$  daily ?- Continue with Dymista two sprays per nostril twice daily as needed.  ? ?3. Return in about 6 months (around 04/28/2022).  ? ?Subjective:  ? ?[name] ARUTYUNYANs a [age].o. female presenting today for follow up of  ?Chief Complaint  ?Patient presents with  ? Follow-up  ? Asthma  ?  Some tightness in chest, had to use rescue inhaler more than usual  ? Allergic Rhinitis   ?  High pollen has agitated her allergies.  ? Other  ?  Referral to ENT for her ringing  in her ears per patient. Pneumonia shot?, does the office have any and if so can she get it today?  ? ? ?[name] [name] a history of the following: ?Patient Active Problem List  ? Diagnosis Date Noted  ? Obesity 03/02/2021  ? Other long term (current) drug therapy 03/02/2021  ? 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 SOB (shortness of breath) 12/08/2020  ? Pericardial effusion 12/08/2020  ? Dry eyes/dry mouth 05/25/2019  ? Bronchitis, mucopurulent recurrent ([place]09/03/2019  ? Moderate persistent asthma without complication 0922/07/5425? Perennial and seasonal allergic rhinitis 02/24/2019  ? Seasonal and perennial allergic rhinoconjunctivitis 02/24/2019  ? Primary osteoarthritis of both knees 04/10/2016  ? Acute chest pain 08/19/2014  ? Costochondritis 08/19/2014  ? Lung nodule 08/19/2014  ? HTN (hypertension) 08/07/2012  ? HLD (hyperlipidemia) 09/26/2011  ? Asthma 09/06/2010  ? Depression 09/06/2010  ? History of allergy 09/06/2010  ? Hypothyroidism 09/06/2010  ? Lower back pain 09/06/2010  ? Arthritis 09/06/2010  ? Pain in joint, pelvic region and thigh 09/06/2010  ? Peripheral neuropathy 09/06/2010  ? Psoriasis 09/06/2010  ? Therapeutic drug monitoring 09/06/2010  ? Tinnitus 09/06/2010  ? Vitamin B 12 deficiency 09/06/2010  ? Cervical spondylosis without myelopathy 04/24/2006  ? Tear of lateral cartilage or meniscus of knee, current 03/13/2006  ? ? ?History obtained from: chart review and patient. ? ?[name] [name] a 89.o. female presenting for a follow up visit.  She was last seen in February 2023.  At that time, lung testing looked largely normal.  We felt that  we can hold off on a biologic.  We continue with Singulair as well as Symbicort 80 mcg 2 puffs twice daily.  She has Flovent that she adds during heavier allergy times of the year.  For her allergic rhinitis, we continue with Claritin or Allegra daily as well as Singulair and Dymista.  She had been on Xolair in the past, but she did not like having 3  injections every 2 weeks. ? ?Since the last visit, she has done well.  ? ?Asthma/Respiratory Symptom History: She has been on Symbicort two puffs BID. She has not added the Flovent at all since the last visit.  She just added th Flovent when things started bleeding. She has been using her rescue inhaler when she is outdoors. She likes dirt and plays in the dirt a lot. She likes the flower gardening more than the veggies. They live in a condominium just down the road from here and they only have a limited amount of space. ? ?Allergic Rhinitis Symptom History: Environmental allergies are severe recently. She has been using her Dymista.  She is wondering about ear ringing and if there is a Youth worker that specializes in that.  She would like a referral. She has not needed antibiotics for any sinus infections at all.She feels that her symptoms are under fairly good control with the various medications that she has available to address everything.  ? ?She has a history of psoriatic arthritis and follows with Dr. Gavin Pound. She is receiving MTX which she gives to herself. Now she remains on Simponi. She was previously on Cimzia but this was not cutting it.  ?  ?She has a history of diverticulosis.  She had a colonoscopy in March 2023 and during the procedure she had an injury on her leg that needed suturing.  She does not remember any injury occurring, but she seems that she did not get enough sedation.  They did find for 5 polyps that were removed. ? ?Otherwise, there have been no changes to her past medical history, surgical history, family history, or social history. ? ? ? ?Review of Systems  ?Constitutional: Negative.  Negative for chills, fever, malaise/fatigue and weight loss.  ?HENT:  Positive for tinnitus. Negative for congestion, ear discharge and ear pain.   ?Eyes:  Negative for pain, discharge and redness.  ?Respiratory:  Negative for cough, sputum production, shortness of breath and wheezing.    ?Cardiovascular: Negative.  Negative for chest pain and palpitations.  ?Gastrointestinal:  Negative for abdominal pain, constipation, diarrhea, heartburn, nausea and vomiting.  ?Skin: Negative.  Negative for itching and rash.  ?Neurological:  Negative for dizziness and headaches.  ?Endo/Heme/Allergies:  Negative for environmental allergies. Does not bruise/bleed easily.   ? ? ? ?Objective:  ? ?Blood pressure 132/60, pulse 69, temperature 97.7 ?F (36.5 ?C), temperature source Temporal, resp. rate 20, height '5\' 8"'$  (1.727 m), weight 213 lb (96.6 kg), SpO2 95 %. ?Body mass index is 32.39 kg/m?. ? ? ? ?Physical Exam ?Vitals reviewed.  ?Constitutional:   ?   Appearance: She is well-developed.  ?   Comments: Pleasant female. Cooperative with the exam.  Very friendly.  ?HENT:  ?   Head: Normocephalic and atraumatic.  ?   Right Ear: Ear canal and external ear normal. There is impacted cerumen.  ?   Left Ear: Tympanic membrane, ear canal and external ear normal.  ?   Nose: No nasal deformity, septal deviation, mucosal edema or rhinorrhea.  ?   Right Turbinates:  Enlarged. Not swollen or pale.  ?   Left Turbinates: Enlarged. Not swollen or pale.  ?   Right Sinus: No maxillary sinus tenderness or frontal sinus tenderness.  ?   Left Sinus: No maxillary sinus tenderness or frontal sinus tenderness.  ?   Comments: Turbinates are erythematous.  No nasal polyps. ?   Mouth/Throat:  ?   Mouth: Mucous membranes are not pale and not dry.  ?   Pharynx: Uvula midline.  ?Eyes:  ?   General: Lids are normal. No allergic shiner.    ?   Right eye: No discharge.     ?   Left eye: No discharge.  ?   Conjunctiva/sclera: Conjunctivae normal.  ?   Right eye: Right conjunctiva is not injected. No chemosis. ?   Left eye: Left conjunctiva is not injected. No chemosis. ?   Pupils: Pupils are equal, round, and reactive to light.  ?Cardiovascular:  ?   Rate and Rhythm: Normal rate and regular rhythm.  ?   Heart sounds: Normal heart sounds.  ?Pulmonary:   ?   Effort: Pulmonary effort is normal. No tachypnea, accessory muscle usage or respiratory distress.  ?   Breath sounds: Normal breath sounds. No wheezing, rhonchi or rales.  ?   Comments: Moving air well

## 2021-10-26 NOTE — Patient Instructions (Addendum)
1. Moderate persistent asthma, uncomplicated ?- Lung testing looked largely normal.  ?- New spacer and demonstration provided. ?- Use this spacer with the Symbicort AND the Flovent.  ?- Daily controller medication(s): Singulair '10mg'$  daily and Symbicort 80/4.66mg two puffs twice daily with spacer ? ? ?- CHANGES IN ALLERGEN HEAVY ENVIRONMENTS: Add on Flovent 1142m two puffs twice daily (in addition to the Symbicort) ? ?- Prior to physical activity: albuterol 2 puffs 10-15 minutes before physical activity. ?- Rescue medications: albuterol 4 puffs every 4-6 hours as needed ?- Asthma control goals:  ?* Full participation in all desired activities (may need albuterol before activity) ?* Albuterol use two time or less a week on average (not counting use with activity) ?* Cough interfering with sleep two time or less a month ?* Oral steroids no more than once a year ?* No hospitalizations ? ?2. Seasonal and perennial allergic rhinitis ?- Continue with Claritin or Allegra once daily. ?- Continue with Singulair (montelukast) '10mg'$  daily ?- Continue with Dymista two sprays per nostril twice daily as needed.  ? ?3. Return in about 6 months (around 04/28/2022).  ? ? ?Please inform usKoreaf any Emergency Department visits, hospitalizations, or changes in symptoms. Call usKoreaefore going to the ED for breathing or allergy symptoms since we might be able to fit you in for a sick visit. Feel free to contact usKoreanytime with any questions, problems, or concerns. ? ?It was a pleasure to see you again today! ? ?Websites that have reliable patient information: ?1. American Academy of Asthma, Allergy, and Immunology: www.aaaai.org ?2. Food Allergy Research and Education (FARE): foodallergy.org ?3. Mothers of Asthmatics: http://www.asthmacommunitynetwork.org ?4. [employer] Allergy, Asthma, and Immunology: [email or web address].co.uk ? ?COVID-19 Vaccine Information can be found at:  [email or web address] questions related to vaccine distribution or appointments, please email [email or web address] or call [phone] ? ?We realize that you might be concerned about having an allergic reaction to the COVID19 vaccines. To help with that concern, WE ARE OFFERING THE COVID19 VACCINES IN OUR OFFICE! Ask the front desk for dates!  ? ? ? ??Like? usKorean Facebook and Instagram for our latest updates!  ?  ? ? ?A healthy democracy works best when ALNew York Life Insurancearticipate! Make sure you are registered to vote! If you have moved or changed any of your contact information, you will need to get this updated before voting! ? ?In some cases, you MAY be able to register to vote online: [email or web address] ? ? ? ? ? ? ? ?

## 2021-10-26 NOTE — Telephone Encounter (Signed)
[facility] called to get clarification on orders sent in to the pharmacy today. Let pharmacy know per AVS that Symbicort 80/4.5 mcg is patient's daily medication and the Flovent 110 mcg is to be added on during flares (changes in allergen heavy environments). ?

## 2021-10-27 NOTE — Telephone Encounter (Signed)
Thank you for doing that! ? ?Salvatore Marvel, MD ?[facility] [place] [facility] ? ?

## 2021-10-31 ENCOUNTER — Encounter: Payer: Self-pay | Admitting: Allergy & Immunology

## 2021-10-31 DIAGNOSIS — H9311 Tinnitus, right ear: Secondary | ICD-10-CM

## 2021-12-01 ENCOUNTER — Other Ambulatory Visit: Payer: Self-pay | Admitting: *Deleted

## 2021-12-01 DIAGNOSIS — M79606 Pain in leg, unspecified: Secondary | ICD-10-CM

## 2021-12-04 ENCOUNTER — Ambulatory Visit (HOSPITAL_COMMUNITY)
Admission: RE | Admit: 2021-12-04 | Discharge: 2021-12-04 | Disposition: A | Discharge status: Home / Self Care | Payer: Medicare Other | Source: Ambulatory Visit | Attending: Vascular Surgery | Admitting: Vascular Surgery

## 2021-12-04 ENCOUNTER — Other Ambulatory Visit: Payer: Self-pay | Admitting: Vascular Surgery

## 2021-12-04 DIAGNOSIS — M79604 Pain in right leg: Secondary | ICD-10-CM | POA: Diagnosis not present

## 2021-12-04 DIAGNOSIS — M79606 Pain in leg, unspecified: Secondary | ICD-10-CM | POA: Diagnosis present

## 2021-12-06 ENCOUNTER — Ambulatory Visit (INDEPENDENT_AMBULATORY_CARE_PROVIDER_SITE_OTHER): Payer: Medicare Other | Admitting: Vascular Surgery

## 2021-12-06 ENCOUNTER — Other Ambulatory Visit: Payer: Self-pay

## 2021-12-06 ENCOUNTER — Encounter: Payer: Self-pay | Admitting: Vascular Surgery

## 2021-12-06 VITALS — BP 149/87 | HR 78 | Temp 98.0°F | Resp 16 | Ht 68.0 in | Wt 204.0 lb

## 2021-12-06 DIAGNOSIS — I83813 Varicose veins of bilateral lower extremities with pain: Secondary | ICD-10-CM | POA: Diagnosis not present

## 2021-12-06 MED ORDER — MONTELUKAST SODIUM 10 MG PO TABS: ORAL_TABLET | ORAL | 5 refills | Status: DC

## 2021-12-06 NOTE — Progress Notes (Signed)
ASSESSMENT & PLAN   CHRONIC VENOUS INSUFFICIENCY: This patient has CEAP C4 (hyperpigmentation).  She has some deep venous reflux on the right and also some superficial venous reflux in the great saphenous vein but no reflux at the saphenofemoral junction.  We only studied the right leg with duplex.  We have discussed the importance of intermittent leg elevation and the proper positioning for this.  We also discussed use of compression stockings.  I have recommended a knee-high compression stocking with a gradient of 15 to 20 mmHg.  I encouraged her to avoid prolonged sitting and standing.  We discussed importance of exercise specifically walking and water aerobics.  She does like to swim.  We also discussed the importance of maintaining a healthy weight.  Currently she is not a candidate for laser ablation of the right great saphenous vein but I explained that that could change in the future if her reflux in the superficial system progresses.  I will plan on seeing her back in 6 months and we will order a reflux study on the left at that time.  RIGHT LEG WOUND: She does have a superficial wound on her right leg but does appear to have an adequate arterial flow.  She has a palpable dorsalis pedis pulse on the right and biphasic Doppler signals in the dorsalis pedis and posterior tibial positions on the right foot.  Given her venous insufficiency elevation should also help with healing of this wound.  Have instructed her to call if the wound is not healing.  Otherwise I will plan on seeing her back in 6 months.  REASON FOR CONSULT:    Varicose veins bilaterally.  The consult is requested by Dr. Osborne Casco.  HPI:   [name] [name] is a 76 y.o. female who is referred with varicose veins of both lower extremities.  I have reviewed the records from the referring office.  The patient was seen on 09/21/2021 for an annual physical exam.  She was noted to have varicose veins and is sent for vascular  consultation.  She states that she has had varicose veins for over 10 years.  They have gradually progressed.  She really does not have significant symptoms associated with her varicosities.  She denies significant aching pain or heaviness.  She denies significant swelling.  She did bump her right shin recently and developed a blood blister which ultimately has dried out.  She denies any history of claudication or rest pain.  She is not diabetic.  She has had no previous history of DVT and no previous venous procedures.  She does not wear compression stockings.  She does elevate her legs some.  Past Medical History:  Diagnosis Date   Asthma    Diverticulitis    Hypothyroid    Psoriatic arthritis ([place])     Family History  Problem Relation Age of Onset   Asthma Mother    Allergic rhinitis Mother    Rheum arthritis Mother    Allergic rhinitis Father    Alzheimer's disease Father    Eczema Brother    Allergic rhinitis Brother    Colon cancer Neg Hx    Esophageal cancer Neg Hx    Stomach cancer Neg Hx    Rectal cancer Neg Hx     SOCIAL HISTORY: Social History   Tobacco Use   Smoking status: Former    Packs/day: 0.10    Years: 20.00    Total pack years: 2.00    Types: Cigarettes  Start date: 54    Quit date: 11/18/2000    Years since quitting: 21.0   Smokeless tobacco: Never   Tobacco comments:    3 cigarettes a day  Substance Use Topics   Alcohol use: Never    Allergies  Allergen Reactions   Latex Itching and Rash   Cat Hair Extract Other (See Comments)   Dust Mite Extract Other (See Comments)   Molds & Smuts     Other reaction(s): Unknown   Other     Other reaction(s): Unknown Other reaction(s): Unknown   Pollen Extract-Tree Extract [Pollen Extract] Other (See Comments)    Current Outpatient Medications  Medication Sig Dispense Refill   albuterol (VENTOLIN HFA) 108 (90 Base) MCG/ACT inhaler Inhale 2 puffs into the lungs every 6 (six) hours as needed for  wheezing or shortness of breath. 8 g 6   Azelastine-Fluticasone 137-50 MCG/ACT SUSP Place 1 spray into the nose 2 (two) times daily as needed. 23 g 5   b complex vitamins tablet Take 1 tablet by mouth daily.     budesonide-formoterol (SYMBICORT) 80-4.5 MCG/ACT inhaler Inhale 2 puffs into the lungs in the morning and at bedtime. 1 each 5   Cholecalciferol (VITAMIN D3) 25 MCG (1000 UT) CAPS Take by mouth.     cycloSPORINE (RESTASIS) 0.05 % ophthalmic emulsion 1 drop 2 (two) times daily.     fluticasone (FLOVENT HFA) 110 MCG/ACT inhaler Inhale 2 puffs into the lungs 2 (two) times daily as needed. Add this when you are in an allergen heavy environment. 1 each 5   folic acid (FOLVITE) 1 MG tablet TAKE 1 TABLET BY MOUTH DAILY     gabapentin (NEURONTIN) 800 MG tablet 1 tablet     Golimumab (SIMPONI ARIA IV) Inject 206.4 mg into the vein as directed. Once every 8 weeks     ketoconazole (NIZORAL) 2 % cream Apply to bottoms of feet for 2-3 weeks 60 g 2   levothyroxine (SYNTHROID) 100 MCG tablet Take 100 mcg by mouth every morning.     loratadine (CLARITIN) 10 MG tablet 1 tablet     Magnesium 250 MG TABS Take 250 mg by mouth daily.     Methotrexate Sodium (METHOTREXATE, PF,) 50 MG/2ML injection 20 mg once a week.     montelukast (SINGULAIR) 10 MG tablet TAKE 1 TABLET(10 MG) BY MOUTH AT BEDTIME 30 tablet 5   pantoprazole (PROTONIX) 40 MG tablet TAKE 1 TABLET(40 MG) BY MOUTH TWICE DAILY BEFORE A MEAL 180 tablet 0   Probiotic TBEC See admin instructions.     traMADol (ULTRAM) 50 MG tablet      Turmeric 500 MG CAPS Take 1 capsule po qd     Zinc 10 MG LOZG Use as directed in the mouth or throat.     methylcellulose oral powder Take by mouth in the morning and at bedtime. (Patient not taking: Reported on 10/26/2021)     Current Facility-Administered Medications  Medication Dose Route Frequency Provider Last Rate Last Admin   0.9 %  sodium chloride infusion  500 mL Intravenous Once Thornton Park, MD        omalizumab Arvid Right) injection 375 mg  375 mg Subcutaneous Q14 Days Garnet Sierras, DO   375 mg at 04/24/21 1516   omalizumab Arvid Right) prefilled syringe 375 mg  375 mg Subcutaneous Q14 Days Valentina Shaggy, MD   375 mg at 05/15/21 1017    REVIEW OF SYSTEMS:  '[X]'$  denotes positive finding, '[ ]'$  denotes negative  finding Cardiac  Comments:  Chest pain or chest pressure:    Shortness of breath upon exertion:    Short of breath when lying flat:    Irregular heart rhythm:        Vascular    Pain in calf, thigh, or hip brought on by ambulation:    Pain in feet at night that wakes you up from your sleep:     Blood clot in your veins:    Leg swelling:         Pulmonary    Oxygen at home:    Productive cough:     Wheezing:         Neurologic    Sudden weakness in arms or legs:     Sudden numbness in arms or legs:     Sudden onset of difficulty speaking or slurred speech:    Temporary loss of vision in one eye:     Problems with dizziness:         Gastrointestinal    Blood in stool:     Vomited blood:         Genitourinary    Burning when urinating:     Blood in urine:        Psychiatric    Major depression:         Hematologic    Bleeding problems:    Problems with blood clotting too easily:        Skin    Rashes or ulcers: x Right leg      Constitutional    Fever or chills:    -  PHYSICAL EXAM:   Vitals:   12/06/21 1318  BP: (!) 149/87  Pulse: 78  Resp: 16  Temp: 98 F (36.7 C)  TempSrc: Temporal  SpO2: 96%  Weight: 204 lb (92.5 kg)  Height: '5\' 8"'$  (1.727 m)   Body mass index is 31.02 kg/m. GENERAL: The patient is a well-nourished female, in no acute distress. The vital signs are documented above. CARDIAC: There is a regular rate and rhythm.  VASCULAR: I do not detect carotid bruits. On the right side she has a palpable femoral, popliteal, and dorsalis pedis pulse.  She has a biphasic dorsalis pedis and posterior tibial signal with the Doppler. On the  left side she has a palpable femoral, popliteal, dorsalis pedis, and posterior tibial pulse.  She has biphasic Doppler signals in the left foot also. She has hyperpigmentation bilaterally consistent with chronic venous insufficiency. She has a wound on her right leg where she recently developed a blood blister after an injury.     She has hyperpigmentation bilaterally. PULMONARY: There is good air exchange bilaterally without wheezing or rales. ABDOMEN: Soft and non-tender with normal pitched bowel sounds.  MUSCULOSKELETAL: There are no major deformities. NEUROLOGIC: No focal weakness or paresthesias are detected. SKIN: There are no ulcers or rashes noted. PSYCHIATRIC: The patient has a normal affect.  DATA:    VENOUS DUPLEX: I have reviewed the venous duplex scan that was done on 12/04/2021.  This was of the right lower extremity only.  There was no evidence of DVT.  There was deep venous reflux in the common femoral vein.  There was superficial venous reflux in the right great saphenous vein from the proximal thigh through the distal thigh with diameters ranging from 4.2-4.7 mm.  However there was no reflux at the saphenofemoral junction.     Deitra Mayo [facility]

## 2021-12-08 ENCOUNTER — Other Ambulatory Visit: Payer: Self-pay

## 2021-12-08 DIAGNOSIS — I83813 Varicose veins of bilateral lower extremities with pain: Secondary | ICD-10-CM

## 2021-12-08 DIAGNOSIS — M79606 Pain in leg, unspecified: Secondary | ICD-10-CM

## 2022-01-11 ENCOUNTER — Encounter: Payer: Medicare Other | Admitting: Gastroenterology

## 2022-02-02 ENCOUNTER — Other Ambulatory Visit: Payer: Self-pay | Admitting: *Deleted

## 2022-02-02 MED ORDER — FAMOTIDINE 40 MG PO TABS: 40.0000 mg | ORAL_TABLET | Freq: Every day | ORAL | 5 refills | Status: AC

## 2022-02-09 ENCOUNTER — Ambulatory Visit (INDEPENDENT_AMBULATORY_CARE_PROVIDER_SITE_OTHER): Payer: Medicare Other | Admitting: Podiatrist

## 2022-02-09 ENCOUNTER — Encounter: Payer: Self-pay | Admitting: Podiatrist

## 2022-02-09 ENCOUNTER — Ambulatory Visit (INDEPENDENT_AMBULATORY_CARE_PROVIDER_SITE_OTHER): Payer: Medicare Other

## 2022-02-09 DIAGNOSIS — M79672 Pain in left foot: Secondary | ICD-10-CM

## 2022-02-09 DIAGNOSIS — M722 Plantar fascial fibromatosis: Secondary | ICD-10-CM

## 2022-02-09 MED ORDER — TRIAMCINOLONE ACETONIDE 10 MG/ML IJ SUSP
10.0000 mg | Freq: Once | INTRAMUSCULAR | Status: AC
  Administered 2022-02-09: 10 mg

## 2022-02-09 NOTE — Progress Notes (Signed)
Subjective: [name] [name] is a 76 y.o. female patient presents to office with complaint of moderate heel pain on the left heel. Patient admits to post static dyskinesia for 3 weeks in duration.  [name] [name] relates she wore a pair of very squishy shoes with minimal support in the heel pain started soon after.  She is tried stretching with no relief in symptoms.  Patient Active Problem List   Diagnosis Date Noted   Obesity 03/02/2021   Other long term (current) drug therapy 03/02/2021   Primary osteoarthritis 03/02/2021   SOB (shortness of breath) 12/08/2020   Pericardial effusion 12/08/2020   Dry eyes/dry mouth 05/25/2019   Bronchitis, mucopurulent recurrent ([place]) 02/26/2019   Moderate persistent asthma without complication 14/43/1540   Perennial and seasonal allergic rhinitis 02/24/2019   Seasonal and perennial allergic rhinoconjunctivitis 02/24/2019   Primary osteoarthritis of both knees 04/10/2016   Acute chest pain 08/19/2014   Costochondritis 08/19/2014   Lung nodule 08/19/2014   HTN (hypertension) 08/07/2012   HLD (hyperlipidemia) 09/26/2011   Asthma 09/06/2010   Depression 09/06/2010   History of allergy 09/06/2010   Hypothyroidism 09/06/2010   Lower back pain 09/06/2010   Arthritis 09/06/2010   Pain in joint, pelvic region and thigh 09/06/2010   Peripheral neuropathy 09/06/2010   Psoriasis 09/06/2010   Therapeutic drug monitoring 09/06/2010   Tinnitus 09/06/2010   Vitamin B 12 deficiency 09/06/2010   Cervical spondylosis without myelopathy 04/24/2006   Tear of lateral cartilage or meniscus of knee, current 03/13/2006    Current Outpatient Medications on File Prior to Visit  Medication Sig Dispense Refill   albuterol (VENTOLIN HFA) 108 (90 Base) MCG/ACT inhaler Inhale 2 puffs into the lungs every 6 (six) hours as needed for wheezing or shortness of breath. 8 g 6   Azelastine-Fluticasone 137-50 MCG/ACT SUSP Place 1 spray into the nose 2 (two) times daily as needed.  23 g 5   b complex vitamins tablet Take 1 tablet by mouth daily.     budesonide-formoterol (SYMBICORT) 80-4.5 MCG/ACT inhaler Inhale 2 puffs into the lungs in the morning and at bedtime. 1 each 5   Cholecalciferol (VITAMIN D3) 25 MCG (1000 UT) CAPS Take by mouth.     cycloSPORINE (RESTASIS) 0.05 % ophthalmic emulsion 1 drop 2 (two) times daily.     famotidine (PEPCID) 40 MG tablet Take 1 tablet (40 mg total) by mouth daily. 30 tablet 5   fluticasone (FLOVENT HFA) 110 MCG/ACT inhaler Inhale 2 puffs into the lungs 2 (two) times daily as needed. Add this when you are in an allergen heavy environment. 1 each 5   folic acid (FOLVITE) 1 MG tablet TAKE 1 TABLET BY MOUTH DAILY     gabapentin (NEURONTIN) 800 MG tablet 1 tablet     Golimumab (SIMPONI ARIA IV) Inject 206.4 mg into the vein as directed. Once every 8 weeks     ketoconazole (NIZORAL) 2 % cream Apply to bottoms of feet for 2-3 weeks 60 g 2   levothyroxine (SYNTHROID) 100 MCG tablet Take 100 mcg by mouth every morning.     loratadine (CLARITIN) 10 MG tablet 1 tablet     Magnesium 250 MG TABS Take 250 mg by mouth daily.     Methotrexate Sodium (METHOTREXATE, PF,) 50 MG/2ML injection 20 mg once a week.     methylcellulose oral powder Take by mouth in the morning and at bedtime. (Patient not taking: Reported on 10/26/2021)     montelukast (SINGULAIR) 10 MG tablet TAKE 1  TABLET(10 MG) BY MOUTH AT BEDTIME 30 tablet 5   pantoprazole (PROTONIX) 40 MG tablet TAKE 1 TABLET(40 MG) BY MOUTH TWICE DAILY BEFORE A MEAL 180 tablet 0   Probiotic TBEC See admin instructions.     traMADol (ULTRAM) 50 MG tablet      Turmeric 500 MG CAPS Take 1 capsule po qd     Zinc 10 MG LOZG Use as directed in the mouth or throat.     Current Facility-Administered Medications on File Prior to Visit  Medication Dose Route Frequency Provider Last Rate Last Admin   0.9 %  sodium chloride infusion  500 mL Intravenous Once Thornton Park, MD       omalizumab Arvid Right)  injection 375 mg  375 mg Subcutaneous Q14 Days Garnet Sierras, DO   375 mg at 04/24/21 1516   omalizumab Arvid Right) prefilled syringe 375 mg  375 mg Subcutaneous Q14 Days Valentina Shaggy, MD   375 mg at 05/15/21 1017    Allergies  Allergen Reactions   Latex Itching and Rash   Cat Hair Extract Other (See Comments)   Dust Mite Extract Other (See Comments)   Molds & Smuts     Other reaction(s): Unknown   Other     Other reaction(s): Unknown Other reaction(s): Unknown   Pollen Extract-Tree Extract [Pollen Extract] Other (See Comments)    Objective: Physical Exam General: The patient is alert and oriented x3 in no acute distress.  Dermatology: Skin is warm, dry and supple bilateral lower extremities.  Small callus on the plantar lateral aspect of the left foot at the tailor's bunion is noted.  Vascular: Dorsalis Pedis pulse and Posterior Tibial pulse are 2/4 bilateral. Capillary fill time is immediate to all digits.  Neurological: Grossly intact to light touch with an achilles reflex of +2/5 and a  negative Tinel's sign bilateral.  Musculoskeletal: Tenderness to palpation at the medial calcaneal tubercale and through the insertion of the plantar fascia on the left foot. No pain with compression of calcaneus bilateral. No pain with tuning fork to calcaneus bilateral. No pain with calf compression bilateral. There is decreased Ankle joint range of motion bilateral. All other joints range of motion within normal limits bilateral. Strength 5/5 in all groups bilateral.  High arch foot type is noted bilateral  Xray, left foot:  Normal osseous mineralization. Joint spaces preserved. No fracture/dislocation/boney destruction.  A very small calcaneal spur present with mild thickening of plantar fascia. No other soft tissue abnormalities or radiopaque foreign bodies.   Assessment and Plan:   ICD-10-CM   1. Pain of left heel  M79.672 DG Foot Complete Left    2. Plantar fasciitis, left  M72.2  triamcinolone acetonide (KENALOG) 10 MG/ML injection 10 mg      -Complete examination performed.  -Xrays reviewed - Discussed etiology and pathology of plantar fasciitis along with conservative approach to treatment.   -After oral consent and aseptic prep, injected a mixture containing '10mg'$  Kenalog and '5mg'$  marcaine plain was infiltrated into the area of maximal tenderness of the left heel. Post-injection care discussed with patient.  Discussed that she may bruise after this injection and she may apply ice if needed. -Recommended good supportive shoes and continued use of her custom orthotics.  We added an arch cookie to help add some support to the arch area of the orthotics.  -Explained and dispensed to patient daily stretching exercises.. -Patient to return to office in 4 weeks if symptoms worsen or fail to improve.

## 2022-02-09 NOTE — Patient Instructions (Signed)

## 2022-02-14 ENCOUNTER — Encounter: Payer: Self-pay | Admitting: Allergy & Immunology

## 2022-02-15 ENCOUNTER — Other Ambulatory Visit: Payer: Self-pay | Admitting: *Deleted

## 2022-02-15 MED ORDER — AZELASTINE-FLUTICASONE 137-50 MCG/ACT NA SUSP: 1.0000 | Freq: Two times a day (BID) | NASAL | 5 refills | Status: DC | PRN

## 2022-03-31 IMAGING — CT CT ABD-PELV W/ CM
2 of 5 series · 17 of 46 positions shown, 19 images · IV contrast (APPLIED)
Comparison: None.

CLINICAL DATA: Abdominal pain, possible diverticulitis

EXAM:
CT ABDOMEN AND PELVIS WITH CONTRAST
TECHNIQUE: Multidetector CT imaging of the abdomen and pelvis was performed
using the standard protocol following bolus administration of
intravenous contrast.

[Series 2: abd (person_name) · axial · 0.90mm/px · z∈[+868,+1268]mm · 14 of 90 slices shown, 16 images]
[im 5/90  soft-tissue]
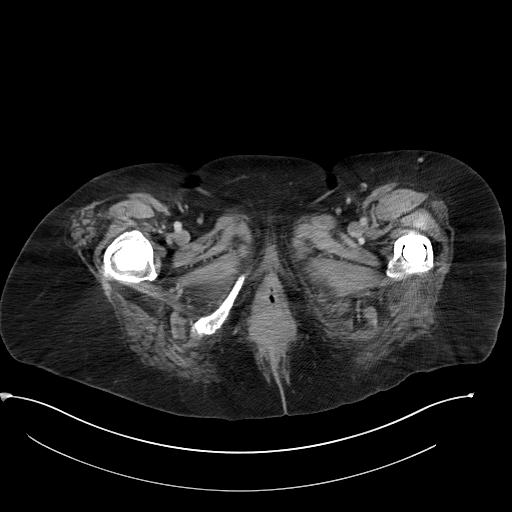
[im 5/90  bone]
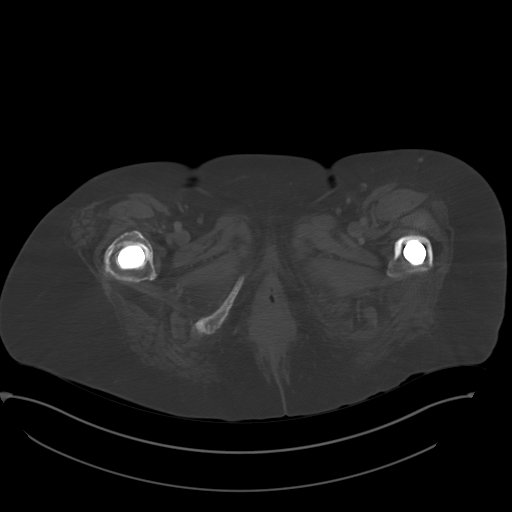
[im 10/90  soft-tissue]
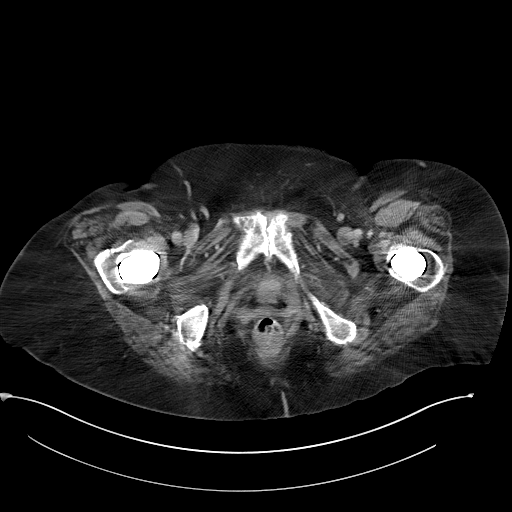
[im 19/90  soft-tissue]
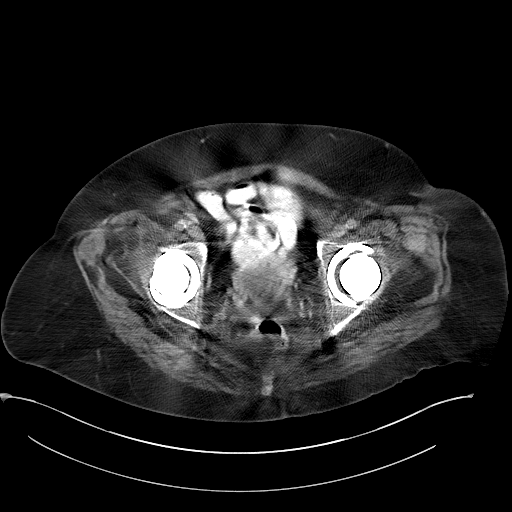
[im 24/90  soft-tissue]
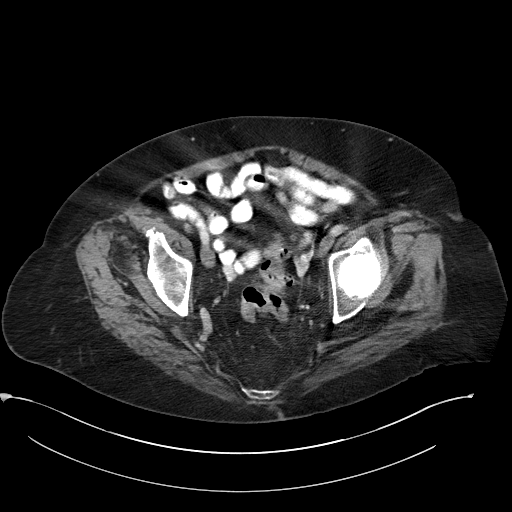
[im 29/90  soft-tissue]
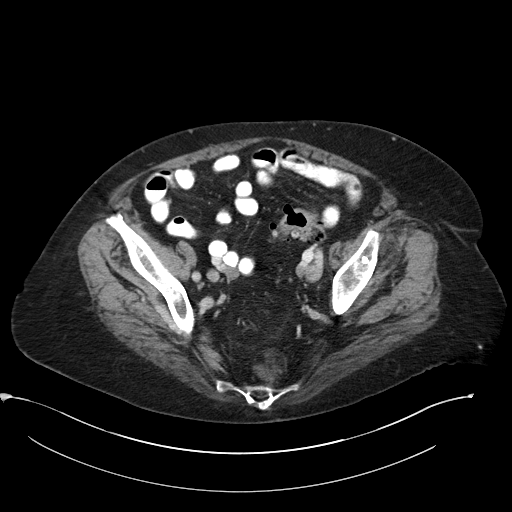
[im 38/90  soft-tissue]
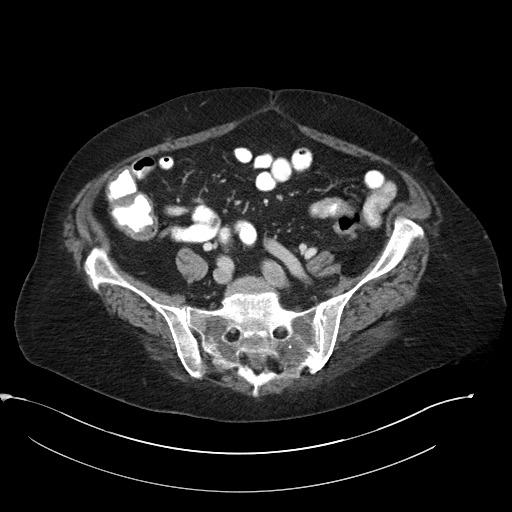
[im 43/90  soft-tissue]
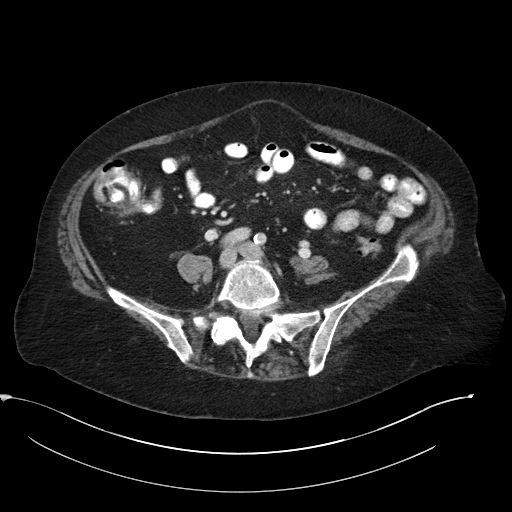
[im 47/90  soft-tissue]
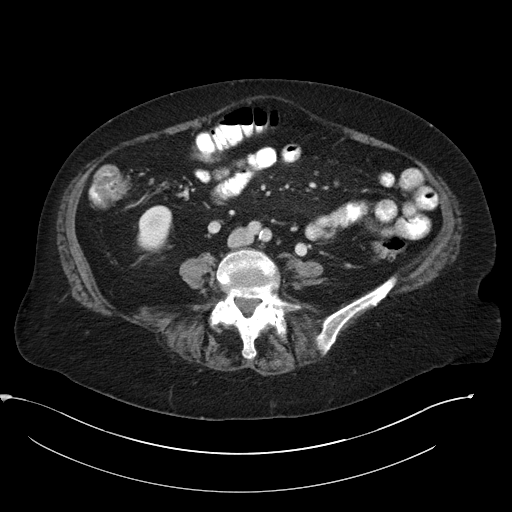
[im 52/90  soft-tissue]
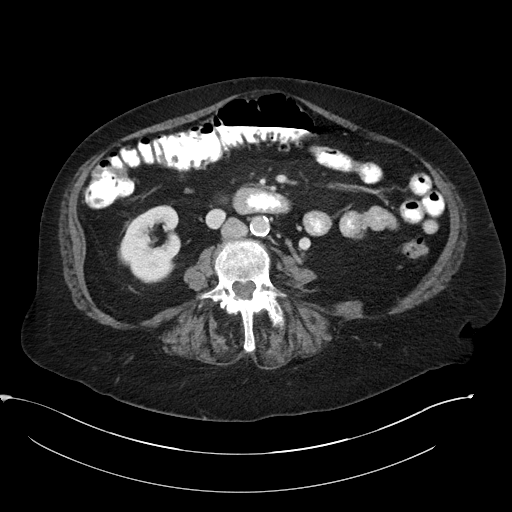
[im 52/90  bone]
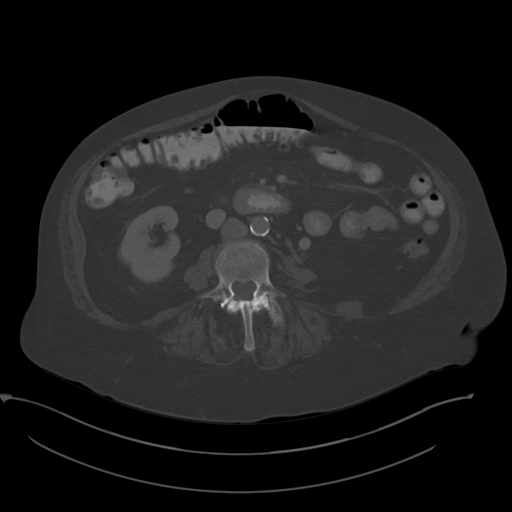
[im 61/90  soft-tissue]
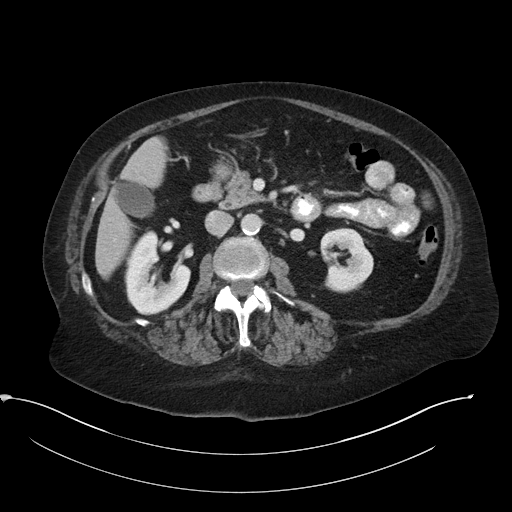
[im 66/90  soft-tissue]
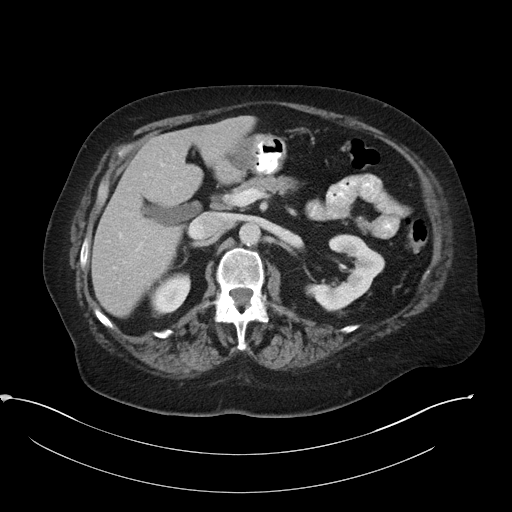
[im 71/90  soft-tissue]
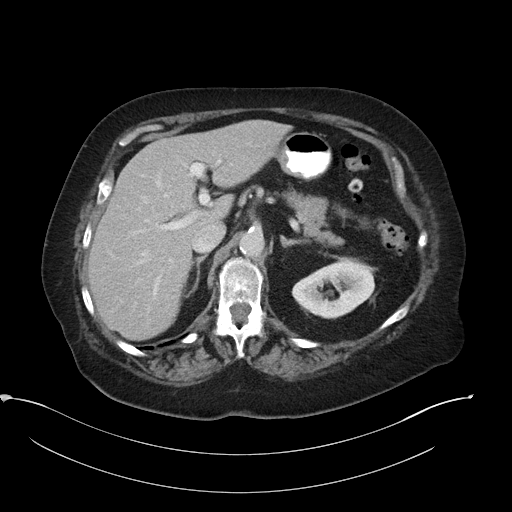
[im 80/90  soft-tissue]
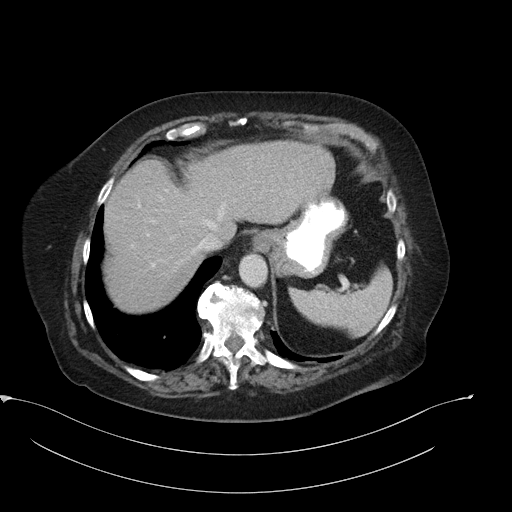
[im 85/90  soft-tissue]
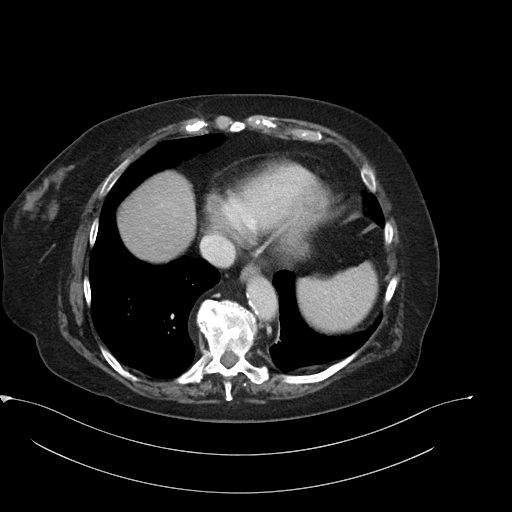

[Series 5: coronal (person_name) · coronal · 0.93mm/px · 3 of 107 slices shown]
[im 36/107  soft-tissue]
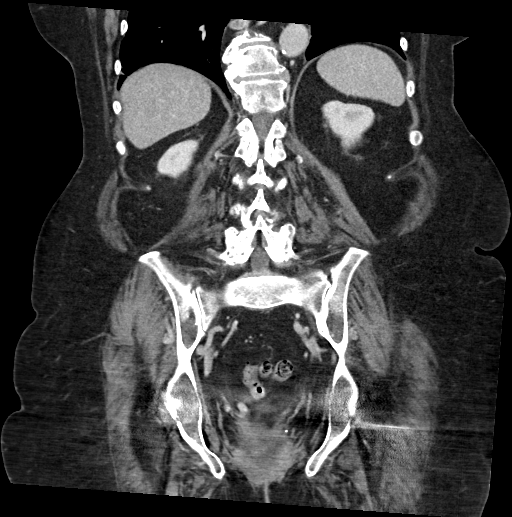
[im 48/107  soft-tissue]
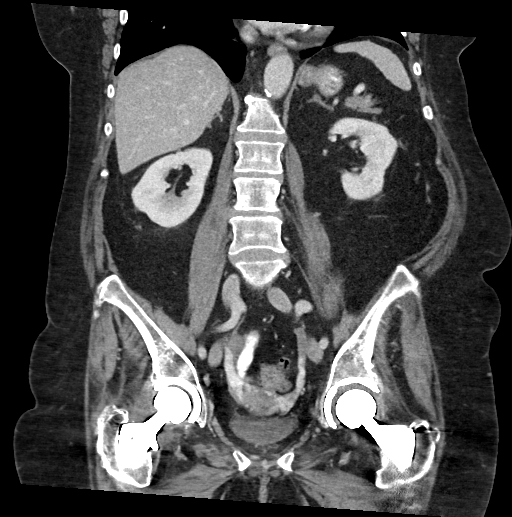
[im 59/107  soft-tissue]
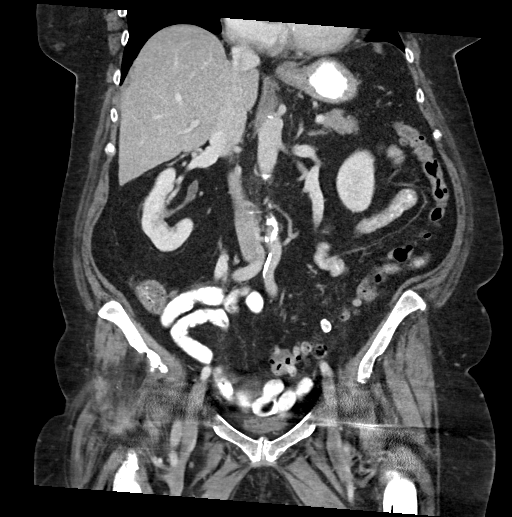

[17 of 46 positions shown; findings below may reference images not displayed]

RADIATION DOSE REDUCTION: This exam was performed according to the
departmental dose-optimization program which includes automated
exposure control, adjustment of the mA and/or kV according to
patient size and/or use of iterative reconstruction technique.

CONTRAST:  100mL OMNIPAQUE IOHEXOL 300 MG/ML  SOLN
FINDINGS: Lower chest: No acute abnormality.

Hepatobiliary: Liver is normal in size and contour with no focal
mass identified. Gallbladder appears normal. No biliary ductal
dilatation identified.

Pancreas: Unremarkable. No pancreatic ductal dilatation or
surrounding inflammatory changes.

Spleen: Normal in size without focal abnormality.

Adrenals/Urinary Tract: Adrenal glands are unremarkable. Kidneys are
normal, without renal calculi, focal lesion, or hydronephrosis.
Bladder is unremarkable.

Stomach/Bowel: No bowel obstruction, free air or pneumatosis.
Extensive colonic diverticulosis no evidence of diverticulitis
visualized. Appendix is normal.

Vascular/Lymphatic: Aortic atherosclerosis. No enlarged abdominal or
pelvic lymph nodes.

Reproductive: Limited evaluation due to motion and metallic
artifacts. No suspicious pelvic mass identified.

Other: No ascites.

Musculoskeletal: Bilateral hip arthroplasties. Mild degenerative
changes in the lumbar spine. No suspicious bony lesions identified.
IMPRESSION: Extensive colonic diverticulosis with no acute diverticulitis
identified.

## 2022-05-01 ENCOUNTER — Ambulatory Visit: Payer: Medicare Other | Admitting: Allergy & Immunology

## 2022-05-17 ENCOUNTER — Ambulatory Visit (INDEPENDENT_AMBULATORY_CARE_PROVIDER_SITE_OTHER): Payer: Medicare Other | Admitting: Podiatry

## 2022-05-17 ENCOUNTER — Other Ambulatory Visit: Payer: Self-pay

## 2022-05-17 ENCOUNTER — Ambulatory Visit (INDEPENDENT_AMBULATORY_CARE_PROVIDER_SITE_OTHER): Payer: Medicare Other | Admitting: Allergy & Immunology

## 2022-05-17 ENCOUNTER — Ambulatory Visit: Payer: Medicare Other | Admitting: Podiatry

## 2022-05-17 ENCOUNTER — Encounter: Payer: Self-pay | Admitting: Allergy & Immunology

## 2022-05-17 ENCOUNTER — Telehealth: Payer: Self-pay

## 2022-05-17 VITALS — BP 124/72 | HR 58 | Temp 98.1°F | Resp 14 | Ht 68.0 in | Wt 202.4 lb

## 2022-05-17 DIAGNOSIS — H9311 Tinnitus, right ear: Secondary | ICD-10-CM

## 2022-05-17 DIAGNOSIS — M722 Plantar fascial fibromatosis: Secondary | ICD-10-CM | POA: Diagnosis not present

## 2022-05-17 DIAGNOSIS — M21622 Bunionette of left foot: Secondary | ICD-10-CM | POA: Diagnosis not present

## 2022-05-17 DIAGNOSIS — H1013 Acute atopic conjunctivitis, bilateral: Secondary | ICD-10-CM

## 2022-05-17 DIAGNOSIS — L84 Corns and callosities: Secondary | ICD-10-CM

## 2022-05-17 DIAGNOSIS — H1010 Acute atopic conjunctivitis, unspecified eye: Secondary | ICD-10-CM

## 2022-05-17 DIAGNOSIS — J302 Other seasonal allergic rhinitis: Secondary | ICD-10-CM

## 2022-05-17 DIAGNOSIS — J4540 Moderate persistent asthma, uncomplicated: Secondary | ICD-10-CM | POA: Diagnosis not present

## 2022-05-17 MED ORDER — FLUTICASONE FUROATE-VILANTEROL 100-25 MCG/ACT IN AEPB: 1.0000 | INHALATION_SPRAY | Freq: Every day | RESPIRATORY_TRACT | 1 refills | Status: AC

## 2022-05-17 NOTE — Progress Notes (Addendum)
FOLLOW UP  Date of Service/Encounter:  05/17/22   Assessment:   Moderate persistent asthma, uncomplicated - previously on Xolair, but now stable without it   Seasonal perennial allergic rhinitis - was on allergen immunotherapy, but stopped due to large local reactions   Tinnitus - referring to Dr. Fredric Dine    Complicated past medical history including rheumatoid arthritis as well as diverticulitis    Plan/Recommendations:   1. Moderate persistent asthma, uncomplicated - Lung testing looked largely normal.  - We are going to change to [facility] instead of Symbicort (it looked like it was a lower tier when I checked).  - Let us know if the Symbicort is actually cheaper and we can send that in.  - Daily controller medication(s): Singulair '10mg'$  daily and Breo 100/90mg one puff once daily - CHANGES IN ALLERGEN HEAVY ENVIRONMENTS: Add on Flovent 1141m two puffs twice daily (in addition to the Symbicort) - Prior to physical activity: albuterol 2 puffs 10-15 minutes before physical activity. - Rescue medications: albuterol 4 puffs every 4-6 hours as needed - Asthma control goals:  * Full participation in all desired activities (may need albuterol before activity) * Albuterol use two time or less a week on average (not counting use with activity) * Cough interfering with sleep two time or less a month * Oral steroids no more than once a year * No hospitalizations  2. Seasonal and perennial allergic rhinitis - Continue with Claritin or Allegra once daily. - Continue with Singulair (montelukast) '10mg'$  daily - Continue with Dymista two sprays per nostril twice daily as needed.   3. Return in about 6 months (around 11/15/2022).   Subjective:   [name] ANDAYAs a [age].o. female presenting today for follow up of  Chief Complaint  Patient presents with   Asthma    [place] of the following: Patient Active Problem  List   Diagnosis Date Noted   Obesity 03/02/2021   Other long term (current) drug therapy 03/02/2021   Primary osteoarthritis 03/02/2021   SOB (shortness of breath) 12/08/2020   Pericardial effusion 12/08/2020   Dry eyes/dry mouth 05/25/2019   Bronchitis, mucopurulent recurrent ([place]09/03/2019   Moderate persistent asthma without complication 0927/11/2374 Perennial and seasonal allergic rhinitis 02/24/2019   Seasonal and perennial allergic rhinoconjunctivitis 02/24/2019   Primary osteoarthritis of both knees 04/10/2016   Acute chest pain 08/19/2014   Costochondritis 08/19/2014   Lung nodule 08/19/2014   HTN (hypertension) 08/07/2012   HLD (hyperlipidemia) 09/26/2011   Asthma 09/06/2010   Depression 09/06/2010   History of allergy 09/06/2010   Hypothyroidism 09/06/2010   Lower back pain 09/06/2010   Arthritis 09/06/2010   Pain in joint, pelvic region and thigh 09/06/2010   Peripheral neuropathy 09/06/2010   Psoriasis 09/06/2010   Therapeutic drug monitoring 09/06/2010   Tinnitus 09/06/2010   Vitamin B 12 deficiency 09/06/2010   Cervical spondylosis without myelopathy 04/24/2006   Tear of lateral cartilage or meniscus of knee, current 03/13/2006    History obtained from: chart review and patient.  [name] [name] a [age].o. female presenting for a follow up visit.  She was last seen in May 2023.  At that time, her lung testing looked great.  We continue with Singulair 10 mg daily as well as Symbicort 80 mcg 2 puffs twice daily.  She has Flovent when she has an allergen having environments 2 puffs twice daily in addition  to the Symbicort.  For her rhinitis, we continue with Claritin or Allegra daily as well as Singulair and Dymista.  We did refer her to see Dr. Fredric Dine for evaluation of her tinnitus.  Since last visit, she has done well.  Asthma/Respiratory Symptom History: She has Symbicort that she uses twice daily. She has not been using the Flovent at all. She has not been using  the albuterol much at all.  She has not needed prednisone for breathing purposes. She does cough more in the morning time. She coughs up a lot of clear mucous for the first hour that she is awake. She pays $50 per month for her Symbicort.   Allergic Rhinitis Symptom History: She has a lot of rhinorrhea in the morning. She has the Dymista that she uses in the morning. Her nose spray is covered by insurance, but not all of it.   She has plenty of the montelukast. She does not need refills of that. She has not needed antibiotics.  She has done very well overall.  She actually went to see Dr. Benjamine Mola.  He diagnosed her with bilateral tinnitus.  Hearing screen was normal.  He recommended following up annually.  There was some wax in the right ear that he removed.  She was on Enbrel in the past. She has been on Cimzia but she is now on Simponi. She is now followed by Dr. Lenna Gilford for Rheumatology.   Otherwise, there have been no changes to her past medical history, surgical history, family history, or social history.    Review of Systems  Constitutional: Negative.  Negative for chills, fever, malaise/fatigue and weight loss.  HENT:  Negative for congestion, ear discharge, ear pain and tinnitus.   Eyes:  Negative for pain, discharge and redness.  Respiratory:  Negative for cough, sputum production, shortness of breath and wheezing.   Cardiovascular: Negative.  Negative for chest pain and palpitations.  Gastrointestinal:  Negative for abdominal pain, constipation, diarrhea, heartburn, nausea and vomiting.  Skin: Negative.  Negative for itching and rash.  Neurological:  Negative for dizziness and headaches.  Endo/Heme/Allergies:  Negative for environmental allergies. Does not bruise/bleed easily.       Objective:   Blood pressure 124/72, pulse (!) 58, temperature 98.1 F (36.7 C), temperature source Temporal, resp. rate 14, height '5\' 8"'$  (1.727 m), weight 202 lb 6.4 oz (91.8 kg), SpO2 96 %. Body mass  index is 30.77 kg/m.    Physical Exam Constitutional:      Appearance: She is well-developed.  HENT:     Head: Normocephalic and atraumatic.     Right Ear: Tympanic membrane, ear canal and external ear normal.     Left Ear: Tympanic membrane, ear canal and external ear normal.     Nose: No nasal deformity, septal deviation, mucosal edema or rhinorrhea.     Right Turbinates: Enlarged and swollen.     Left Turbinates: Enlarged and swollen.     Right Sinus: No maxillary sinus tenderness or frontal sinus tenderness.     Left Sinus: No maxillary sinus tenderness or frontal sinus tenderness.     Mouth/Throat:     Lips: Pink.     Mouth: Mucous membranes are moist. Mucous membranes are not pale and not dry.     Pharynx: Uvula midline.     Comments: Cobblestoning in the posterior oropharynx.  Eyes:     General: Lids are normal. No allergic shiner.       Right eye: No discharge.  Left eye: No discharge.     Conjunctiva/sclera: Conjunctivae normal.     Right eye: Right conjunctiva is not injected. No chemosis.    Left eye: Left conjunctiva is not injected. No chemosis.    Pupils: Pupils are equal, round, and reactive to light.  Cardiovascular:     Rate and Rhythm: Normal rate and regular rhythm.     Heart sounds: Normal heart sounds.  Pulmonary:     Effort: Pulmonary effort is normal. No tachypnea, accessory muscle usage or respiratory distress.     Breath sounds: Normal breath sounds. No wheezing, rhonchi or rales.     Comments: Moving air well in all lung fields. No increased work of breathing noted.  Chest:     Chest wall: No tenderness.  Lymphadenopathy:     Cervical: No cervical adenopathy.  Skin:    General: Skin is warm.     Capillary Refill: Capillary refill takes less than 2 seconds.     Coloration: Skin is not pale.     Findings: No abrasion, erythema, petechiae or rash. Rash is not papular, urticarial or vesicular.     Comments: No eczematous or urticarial lesions  noted.   Neurological:     Mental Status: She is alert.  Psychiatric:        Behavior: Behavior is cooperative.      Diagnostic studies:    Spirometry: results abnormal (FEV1: 1.62/64%, FVC: 2.30/68%, FEV1/FVC: 70%).    Spirometry consistent with possible restrictive disease.   Allergy Studies: none       Salvatore Marvel, MD  [facility]

## 2022-05-17 NOTE — Patient Instructions (Addendum)
1. Moderate persistent asthma, uncomplicated - Lung testing looked largely normal.  - We are going to change to [facility] instead of Symbicort (it looked like it was a lower tier when I checked).  - Let us know if the Symbicort is actually cheaper and we can send that in.  - Daily controller medication(s): Singulair '10mg'$  daily and Breo 100/32mg one puff once daily - CHANGES IN ALLERGEN HEAVY ENVIRONMENTS: Add on Flovent 1134m two puffs twice daily (in addition to the Symbicort) - Prior to physical activity: albuterol 2 puffs 10-15 minutes before physical activity. - Rescue medications: albuterol 4 puffs every 4-6 hours as needed - Asthma control goals:  * Full participation in all desired activities (may need albuterol before activity) * Albuterol use two time or less a week on average (not counting use with activity) * Cough interfering with sleep two time or less a month * Oral steroids no more than once a year * No hospitalizations  2. Seasonal and perennial allergic rhinitis - Continue with Claritin or Allegra once daily. - Continue with Singulair (montelukast) '10mg'$  daily - Continue with Dymista two sprays per nostril twice daily as needed.   3. Return in about 6 months (around 11/15/2022).    Please inform usKoreaf any Emergency Department visits, hospitalizations, or changes in symptoms. Call usKoreaefore going to the ED for breathing or allergy symptoms since we might be able to fit you in for a sick visit. Feel free to contact usKoreanytime with any questions, problems, or concerns.  It was a pleasure to see you again today!  Websites that have reliable patient information: 1. American Academy of Asthma, Allergy, and Immunology: www.aaaai.org 2. Food Allergy Research and Education (FARE): foodallergy.org 3. Mothers of Asthmatics: http://www.asthmacommunitynetwork.org 4. American College of Allergy, Asthma, and Immunology: www.acaai.org   COVID-19 Vaccine Information can be found at:  [email or web address] questions related to vaccine distribution or appointments, please email [email or web address] or call [phone]  We realize that you might be concerned about having an allergic reaction to the COVID19 vaccines. To help with that concern, WE ARE OFFERING THE COVID19 VACCINES IN OUR OFFICE! Ask the front desk for dates!     "Like" usKorean Facebook and Instagram for our latest updates!      A healthy democracy works best when ALNew York Life Insurancearticipate! Make sure you are registered to vote! If you have moved or changed any of your contact information, you will need to get this updated before voting!  In some cases, you MAY be able to register to vote online: [email or web address]

## 2022-05-17 NOTE — Telephone Encounter (Signed)
Please disregard - provider will add patient phone note to office note.

## 2022-05-18 NOTE — Progress Notes (Signed)
Subjective:   Patient ID: [name] [name], female   DOB: 76 y.o.   MRN: [id number]   HPI Patient presents with a lot of pain on the left fifth metatarsal head stating that it has really been bothering her and making it hard to walk   ROS      Objective:  Physical Exam  Neurovascular status was found to be intact muscle strength adequate good digital perfusion noted.  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  I do think there is nothing for me to trim I am uncomfortable at doing injections because of the thinness of her fat pad and I do think she is getting need to consider fifth metatarsal head resection.  I did educate her on this and went over at great length what would be required for this condition and patient does want to get it done but needs to hold off for now all questions answered at the current time

## 2022-06-24 ENCOUNTER — Other Ambulatory Visit: Payer: Self-pay | Admitting: Allergy & Immunology

## 2022-08-01 ENCOUNTER — Ambulatory Visit (HOSPITAL_COMMUNITY): Payer: Medicare Other | Attending: Vascular Surgery

## 2022-08-01 ENCOUNTER — Encounter (HOSPITAL_COMMUNITY): Payer: Self-pay

## 2022-08-07 ENCOUNTER — Ambulatory Visit (HOSPITAL_COMMUNITY)
Admission: RE | Admit: 2022-08-07 | Discharge: 2022-08-07 | Disposition: A | Discharge status: Home / Self Care | Payer: Medicare Other | Source: Ambulatory Visit | Attending: Vascular Surgery | Admitting: Vascular Surgery

## 2022-08-07 DIAGNOSIS — I83813 Varicose veins of bilateral lower extremities with pain: Secondary | ICD-10-CM | POA: Insufficient documentation

## 2022-08-07 DIAGNOSIS — M79606 Pain in leg, unspecified: Secondary | ICD-10-CM | POA: Diagnosis present

## 2022-08-08 ENCOUNTER — Ambulatory Visit (INDEPENDENT_AMBULATORY_CARE_PROVIDER_SITE_OTHER): Payer: Medicare Other | Admitting: Vascular Surgery

## 2022-08-08 ENCOUNTER — Encounter: Payer: Self-pay | Admitting: Vascular Surgery

## 2022-08-08 VITALS — BP 129/78 | HR 85 | Temp 98.0°F | Resp 16 | Ht 68.0 in | Wt 197.0 lb

## 2022-08-08 DIAGNOSIS — I872 Venous insufficiency (chronic) (peripheral): Secondary | ICD-10-CM

## 2022-08-08 NOTE — Progress Notes (Signed)
REASON FOR VISIT:   Follow-up of chronic venous insufficiency  MEDICAL ISSUES:   CHRONIC VENOUS INSUFFICIENCY: This patient has some deep and superficial venous reflux bilaterally.  However her superficial venous reflux currently is not significant and currently she is not a candidate for laser ablation on either side.  We have again discussed conservative measures.  I have encouraged her to elevate her legs daily.  I have encouraged her to avoid prolonged sitting and standing.  We discussed importance of exercise specifically walking and water aerobics.  She will continue to wear her knee-high compression stockings.  If her symptoms progress she knows to call and certainly we can repeat her study in the future.  She understands that venous disease does tend to gradually progress.  HPI:   [name] [name] is a pleasant 77 y.o. female who I saw on 12/06/2021 with chronic venous insufficiency.  She had C4 venous disease.  We discussed conservative measures.  She had venous reflux testing on the right only.  She had some deep venous reflux on the right and some superficial venous reflux but no reflux at the saphenofemoral junction.  She comes in for a 64-monthfollow-up visit.  Of note, at that time she had a wound on her right leg.  She had a palpable dorsalis pedis pulse and biphasic Doppler signals in the right foot.  Since I saw her last, the wound on the right leg healed.  She did get scratched by her pet on the left leg but this wound is also healing.  Photograph of the wound as documented below.  She does describe some aching pain in her legs associated with prolonged sitting and standing.  She has been wearing knee-high compression stockings.  She does elevate her legs some.  Past Medical History:  Diagnosis Date   Asthma    Diverticulitis    Hypothyroid    [facility] [place]     Family History  Problem Relation Age of Onset   Asthma Mother    Allergic rhinitis Mother     Rheum arthritis Mother    Allergic rhinitis Father    Alzheimer's disease Father    Eczema Brother    Allergic rhinitis Brother    Colon cancer Neg Hx    Esophageal cancer Neg Hx    Stomach cancer Neg Hx    Rectal cancer Neg Hx     SOCIAL HISTORY: Social History   Tobacco Use   Smoking status: Former    Packs/day: 0.10    Years: 20.00    Total pack years: 2.00    Types: Cigarettes    Start date: 1969    Quit date: 11/18/2000    Years since quitting: 21.7   Smokeless tobacco: Never   Tobacco comments:    3 cigarettes a day  Substance Use Topics   Alcohol use: Never    Allergies  Allergen Reactions   Latex Itching and Rash   Cat Hair Extract Other (See Comments)   Dust Mite Extract Other (See Comments)   Molds & Smuts     Other reaction(s): Unknown   Other     Other reaction(s): Unknown Other reaction(s): Unknown   Pollen Extract-Tree Extract [Pollen Extract] Other (See Comments)    Current Outpatient Medications  Medication Sig Dispense Refill   albuterol (VENTOLIN HFA) 108 (90 Base) MCG/ACT inhaler Inhale 2 puffs into the lungs every 6 (six) hours as needed for wheezing or shortness of breath. 8 g 6  Azelastine-Fluticasone 137-50 MCG/ACT SUSP Place 1 spray into the nose 2 (two) times daily as needed. 23 g 5   b complex vitamins tablet Take 1 tablet by mouth daily.     budesonide-formoterol (SYMBICORT) 80-4.5 MCG/ACT inhaler Inhale 2 puffs into the lungs in the morning and at bedtime. 1 each 5   Cholecalciferol (VITAMIN D3) 25 MCG (1000 UT) CAPS Take by mouth.     cycloSPORINE (RESTASIS) 0.05 % ophthalmic emulsion 1 drop 2 (two) times daily.     EPINEPHrine 0.3 mg/0.3 mL IJ SOAJ injection ADMINISTER 0.3 MG IN THE MUSCLE 1 TIME FOR 1 DOSE     famotidine (PEPCID) 40 MG tablet Take 1 tablet (40 mg total) by mouth daily. 30 tablet 5   fluticasone furoate-vilanterol (BREO ELLIPTA) 100-25 MCG/ACT AEPB Inhale 1 puff into the lungs daily. 90 each 1   gabapentin  (NEURONTIN) 800 MG tablet 1 tablet     Golimumab (SIMPONI ARIA IV) Inject 206.4 mg into the vein as directed. Once every 8 weeks     influenza vac split quadrivalent PF (FLUARIX) 0.5 ML injection ADM 0.5ML IM UTD     ketoconazole (NIZORAL) 2 % cream Apply to bottoms of feet for 2-3 weeks 60 g 2   levothyroxine (SYNTHROID) 100 MCG tablet Take 100 mcg by mouth every morning.     loratadine (CLARITIN) 10 MG tablet 1 tablet     Magnesium 250 MG TABS Take 250 mg by mouth daily.     Methotrexate Sodium (METHOTREXATE, PF,) 50 MG/2ML injection 20 mg once a week.     montelukast (SINGULAIR) 10 MG tablet TAKE 1 TABLET(10 MG) BY MOUTH AT BEDTIME 30 tablet 5   neomycin-polymyxin b-dexamethasone (MAXITROL) 3.5-10000-0.1 OINT SMARTSIG:sparingly Left Eye Twice Daily     pantoprazole (PROTONIX) 40 MG tablet TAKE 1 TABLET(40 MG) BY MOUTH TWICE DAILY BEFORE A MEAL 180 tablet 0   traMADol (ULTRAM) 50 MG tablet      Turmeric 500 MG CAPS Take 1 capsule po qd     fluticasone (FLOVENT HFA) 110 MCG/ACT inhaler Inhale 2 puffs into the lungs 2 (two) times daily as needed. Add this when you are in an allergen heavy environment. 1 each 5   Current Facility-Administered Medications  Medication Dose Route Frequency Provider Last Rate Last Admin   0.9 %  sodium chloride infusion  500 mL Intravenous Once Thornton Park, MD       omalizumab Arvid Right) injection 375 mg  375 mg Subcutaneous Q14 Days Garnet Sierras, DO   375 mg at 04/24/21 1516   omalizumab Arvid Right) prefilled syringe 375 mg  375 mg Subcutaneous Q14 Days Valentina Shaggy, MD   375 mg at 05/15/21 1017    REVIEW OF SYSTEMS:  [X]$  denotes positive finding, [ ]$  denotes negative finding Cardiac  Comments:  Chest pain or chest pressure:    Shortness of breath upon exertion:    Short of breath when lying flat:    Irregular heart rhythm:        Vascular    Pain in calf, thigh, or hip brought on by ambulation:    Pain in feet at night that wakes you up from  your sleep:     Blood clot in your veins:    Leg swelling:  x       Pulmonary    Oxygen at home:    Productive cough:     Wheezing:         Neurologic    Sudden  weakness in arms or legs:     Sudden numbness in arms or legs:     Sudden onset of difficulty speaking or slurred speech:    Temporary loss of vision in one eye:     Problems with dizziness:         Gastrointestinal    Blood in stool:     Vomited blood:         Genitourinary    Burning when urinating:     Blood in urine:        Psychiatric    Major depression:         Hematologic    Bleeding problems:    Problems with blood clotting too easily:        Skin    Rashes or ulcers:        Constitutional    Fever or chills:     PHYSICAL EXAM:   Vitals:   08/08/22 1304  BP: 129/78  Pulse: 85  Resp: 16  Temp: 98 F (36.7 C)  TempSrc: Temporal  SpO2: 96%  Weight: 197 lb (89.4 kg)  Height: 5' 8"$  (1.727 m)    GENERAL: The patient is a well-nourished female, in no acute distress. The vital signs are documented above. CARDIAC: There is a regular rate and rhythm.  VASCULAR: I do not detect carotid bruits. She has a biphasic dorsalis pedis and posterior tibial signal bilaterally.   PULMONARY: There is good air exchange bilaterally without wheezing or rales. ABDOMEN: Soft and non-tender with normal pitched bowel sounds.  MUSCULOSKELETAL: There are no major deformities or cyanosis. NEUROLOGIC: No focal weakness or paresthesias are detected. SKIN: There are no ulcers or rashes noted. PSYCHIATRIC: The patient has a normal affect.  DATA:    VENOUS DUPLEX: I have reviewed her venous duplex scan that was done yesterday.  There was no evidence of DVT on the left side.  The study was of the left leg only.  There was deep venous reflux in the common femoral vein and popliteal vein.  There was minimal superficial venous reflux.  There was reflux at the saphenofemoral junction and in the proximal thigh and the great  saphenous vein diameter of the vein in this area was 3.5 mm.  Deitra [facility] [phone]

## 2022-09-11 ENCOUNTER — Other Ambulatory Visit: Payer: Self-pay | Admitting: Allergy & Immunology

## 2022-11-03 ENCOUNTER — Emergency Department (HOSPITAL_COMMUNITY)
Admission: EM | Admit: 2022-11-03 | Discharge: 2022-11-04 | Disposition: A | Discharge status: Home / Self Care | Payer: Medicare Other | Attending: Emergency Medicine | Admitting: Emergency Medicine

## 2022-11-03 ENCOUNTER — Encounter (HOSPITAL_COMMUNITY): Payer: Self-pay

## 2022-11-03 ENCOUNTER — Emergency Department (HOSPITAL_COMMUNITY): Payer: Medicare Other

## 2022-11-03 DIAGNOSIS — S0181XA Laceration without foreign body of other part of head, initial encounter: Secondary | ICD-10-CM | POA: Diagnosis not present

## 2022-11-03 DIAGNOSIS — S5011XA Contusion of right forearm, initial encounter: Secondary | ICD-10-CM | POA: Diagnosis not present

## 2022-11-03 DIAGNOSIS — Z79899 Other long term (current) drug therapy: Secondary | ICD-10-CM | POA: Diagnosis not present

## 2022-11-03 DIAGNOSIS — W1812XA Fall from or off toilet with subsequent striking against object, initial encounter: Secondary | ICD-10-CM | POA: Insufficient documentation

## 2022-11-03 DIAGNOSIS — D696 Thrombocytopenia, unspecified: Secondary | ICD-10-CM | POA: Insufficient documentation

## 2022-11-03 DIAGNOSIS — S0990XA Unspecified injury of head, initial encounter: Secondary | ICD-10-CM | POA: Diagnosis present

## 2022-11-03 DIAGNOSIS — Z91040 Latex allergy status: Secondary | ICD-10-CM | POA: Diagnosis not present

## 2022-11-03 DIAGNOSIS — E039 Hypothyroidism, unspecified: Secondary | ICD-10-CM | POA: Insufficient documentation

## 2022-11-03 DIAGNOSIS — S5012XA Contusion of left forearm, initial encounter: Secondary | ICD-10-CM | POA: Insufficient documentation

## 2022-11-03 DIAGNOSIS — W19XXXA Unspecified fall, initial encounter: Secondary | ICD-10-CM

## 2022-11-03 LAB — BASIC METABOLIC PANEL
Anion gap: 8 (ref 5–15)
BUN: 12 mg/dL (ref 8–23)
CO2: 23 mmol/L (ref 22–32)
Calcium: 8.8 mg/dL — ABNORMAL LOW (ref 8.9–10.3)
Chloride: 108 mmol/L (ref 98–111)
Creatinine, Ser: 0.51 mg/dL (ref 0.44–1.00)
GFR, Estimated: >60 mL/min (ref >60)
Glucose, Bld: 97 mg/dL (ref 70–99)
Potassium: 3.7 mmol/L (ref 3.5–5.1)
Sodium: 139 mmol/L (ref 135–145)

## 2022-11-03 MED ORDER — LIDOCAINE-EPINEPHRINE 2 %-1:100000 IJ SOLN
20.0000 mL | Freq: Once | INTRAMUSCULAR | Status: AC
  Administered 2022-11-04: 20 mL
  Filled 2022-11-03: qty 1

## 2022-11-03 NOTE — ED Triage Notes (Signed)
Pt presents to ED for evaluation of fall injury to forehead while losing balance using the toilet. Pt has noted laceration to top of head to the forehead.

## 2022-11-03 NOTE — ED Provider Notes (Signed)
[place] EMERGENCY DEPARTMENT AT [facility] Provider Note   CSN: [id number] Arrival date & time: 11/03/22  2253     History  Chief Complaint  Patient presents with   [name] [name] is a 77 y.o. female.  The history is provided by the patient.  Fall  [name] [name] is a 77 y.o. female who presents to the Emergency Department complaining of fall.  She presents to the emergency department for evaluation of injuries following a fall that occurred around 930 this evening.  She states that she was getting off the toilet and lost her balance and fell, striking her head on the tile.  No loss of consciousness.  No associated nausea, vomiting, chest pain, difficulty breathing.  She was able to get herself back up.  She presents for evaluation due to persistent bleeding.  She does have a history of neuropathy and poor balance at baseline and uses a cane outside and a walker at times at home.  She does live alone.  She has a daughter nearby.  She has a history of psoriatic arthritis, hypothyroidism.  She was recently diagnosed with thrombocytopenia and is still awaiting outpatient workup.  Her tetanus is up-to-date.  No drug use.  She does drink 2 to 3 glasses of wine daily.     Home Medications Prior to Admission medications   Medication Sig Start Date End Date Taking? Authorizing Provider  albuterol (VENTOLIN HFA) 108 (90 Base) MCG/ACT inhaler Inhale 2 puffs into the lungs every 6 (six) hours as needed for wheezing or shortness of breath. 12/02/20   Charlott Holler, MD  Azelastine-Fluticasone [phone] MCG/ACT SUSP Place 1 spray into the nose 2 (two) times daily as needed. 02/15/22   Alfonse Spruce, MD  b complex vitamins tablet Take 1 tablet by mouth daily.    [provider]  budesonide-formoterol (SYMBICORT) 80-4.5 MCG/ACT inhaler Inhale 2 puffs into the lungs in the morning and at bedtime. 10/26/21   Alfonse Spruce, MD  Cholecalciferol  (VITAMIN D3) 25 MCG (1000 UT) CAPS Take by mouth. 04/24/11   [provider]  cycloSPORINE (RESTASIS) 0.05 % ophthalmic emulsion 1 drop 2 (two) times daily.    [provider]  EPINEPHrine 0.3 mg/0.3 mL IJ SOAJ injection ADMINISTER 0.3 MG IN THE MUSCLE 1 TIME FOR 1 DOSE    [provider]  famotidine (PEPCID) 40 MG tablet Take 1 tablet (40 mg total) by mouth daily. 02/02/22   Unk Lightning, PA  fluticasone (FLOVENT HFA) 110 MCG/ACT inhaler Inhale 2 puffs into the lungs 2 (two) times daily as needed. Add this when you are in an allergen heavy environment. 10/26/21   Alfonse Spruce, MD  gabapentin (NEURONTIN) 800 MG tablet 1 tablet    [provider]  Golimumab (SIMPONI ARIA IV) Inject 206.4 mg into the vein as directed. Once every 8 weeks    [provider]  influenza vac split quadrivalent PF (FLUARIX) 0.5 ML injection ADM 0.5ML IM UTD    [provider]  ketoconazole (NIZORAL) 2 % cream Apply to bottoms of feet for 2-3 weeks 03/02/21   Delories Heinz, DPM  levothyroxine (SYNTHROID) 100 MCG tablet Take 100 mcg by mouth every morning. 02/20/21   [provider]  loratadine (CLARITIN) 10 MG tablet 1 tablet    [provider]  Magnesium 250 MG TABS Take 250 mg by mouth daily.    [provider]  Methotrexate Sodium (  METHOTREXATE, PF,) 50 MG/2ML injection 20 mg once a week. 12/30/20   [provider]  montelukast (SINGULAIR) 10 MG tablet TAKE 1 TABLET(10 MG) BY MOUTH AT BEDTIME 09/11/22   Alfonse Spruce, MD  neomycin-polymyxin b-dexamethasone (MAXITROL) 3.5-10000-0.1 OINT SMARTSIG:sparingly Left Eye Twice Daily 12/28/21   [provider]  pantoprazole (PROTONIX) 40 MG tablet TAKE 1 TABLET(40 MG) BY MOUTH TWICE DAILY BEFORE A MEAL 09/11/21   Tressia Danas, MD  traMADol Janean Sark) 50 MG tablet  01/26/20   [provider]  Turmeric 500 MG CAPS Take 1 capsule po qd 02/17/19   [provider]      Allergies    Latex, Cat hair extract, Dust mite extract, Molds & smuts, Other, and Pollen extract-tree extract [pollen extract]    Review of Systems   Review of Systems  All other systems reviewed and are negative.   Physical Exam Updated Vital Signs BP 116/63 (BP Location: Right Arm)   Pulse 70   Temp 98.4 F (36.9 C) (Oral)   Resp 17   Ht 5\' 8"  (1.727 m)   Wt 89.4 kg   SpO2 97%   BMI 29.97 kg/m  Physical Exam Vitals and nursing note reviewed.  Constitutional:      Appearance: She is well-developed.  HENT:     Head: Normocephalic.     Comments: Large laceration to central forehead and anterior scalp Cardiovascular:     Rate and Rhythm: Normal rate and regular rhythm.     Heart sounds: No murmur heard. Pulmonary:     Effort: Pulmonary effort is normal. No respiratory distress.     Breath sounds: Normal breath sounds.  Abdominal:     Palpations: Abdomen is soft.     Tenderness: There is no abdominal tenderness. There is no guarding or rebound.  Musculoskeletal:        General: No tenderness.     Comments: Large areas of ecchymosis to bilateral forearms.  Abrasion to right ulnar styloid without tenderness.  Able to fully range bilateral shoulders, elbows, wrists, hips, knees.  1+ edema to BLE.   Skin:    General: Skin is warm and dry.  Neurological:     Mental Status: She is alert and oriented to person, place, and time.     Comments: No asymmetry of facial movements.  5 out of 5 strength in all 4 extremities  Psychiatric:        Behavior: Behavior normal.     ED Results / Procedures / Treatments   Labs (all labs ordered are listed, but only abnormal results are displayed) Labs Reviewed  BASIC METABOLIC PANEL - Abnormal; Notable for the following components:      Result Value   Calcium 8.8 (*)    All other components within normal limits  CBC WITH DIFFERENTIAL/PLATELET - Abnormal; Notable for the following components:   WBC 2.8 (*)     RBC 3.80 (*)    MCV 101.6 (*)    RDW 17.3 (*)    Platelets 76 (*)    Neutro Abs 1.3 (*)    All other components within normal limits    EKG None  Radiology CT Head Wo Contrast  Result Date: 11/03/2022 CLINICAL DATA:  Head trauma, moderate-severe; Neck trauma (Age >= 65y), fall EXAM: CT HEAD WITHOUT CONTRAST CT CERVICAL SPINE WITHOUT CONTRAST TECHNIQUE: Multidetector CT imaging of the head and cervical spine was performed following the standard protocol without intravenous contrast. Multiplanar CT image reconstructions of the cervical  spine were also generated. RADIATION DOSE REDUCTION: This exam was performed according to the departmental dose-optimization program which includes automated exposure control, adjustment of the mA and/or kV according to patient size and/or use of iterative reconstruction technique. COMPARISON:  None Available. FINDINGS: CT HEAD FINDINGS Brain: No evidence of acute infarction, hemorrhage, hydrocephalus, extra-axial collection or mass lesion/mass effect. Vascular: No hyperdense vessel or unexpected calcification. Skull: Normal. Negative for fracture or focal lesion. Sinuses/Orbits: No acute finding. Other: Mastoid air cells and middle ear cavities are clear. Small frontal scalp hematoma with punctate foci of subcutaneous gas. CT CERVICAL SPINE FINDINGS Alignment: Normal. Skull base and vertebrae: Craniocervical alignment is normal. The atlantodental interval is not widened. No acute fracture of the cervical spine. There is ankylosis of the left facets of C2-3 as well as the facet joints bilaterally and vertebral bodies of C4-C6. Soft tissues and spinal canal: No prevertebral fluid or swelling. No visible canal hematoma. Disc levels: There is disc space narrowing, disc calcification, and endplate remodeling throughout the cervical spine in keeping with changes of advanced degenerative disc disease, most severe at C6-T1. Prevertebral soft tissues are not thickened on sagittal  reformats. No high-grade canal stenosis. Multilevel uncovertebral and facet arthrosis results in moderate to severe bilateral neuroforaminal narrowing at C6-7 and moderate right neuroforaminal narrowing at C7-T1. Upper chest: Negative. Other: None IMPRESSION: 1. No acute intracranial abnormality. No calvarial fracture. Small frontal scalp hematoma. 2. No acute fracture or listhesis of the cervical spine. 3. 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 Electronically Signed   By: Helyn Numbers M.D.   On: 11/03/2022 23:57   CT Cervical Spine Wo Contrast  Result Date: 11/03/2022 CLINICAL DATA:  Head trauma, moderate-severe; Neck trauma (Age >= 65y), fall EXAM: CT HEAD WITHOUT CONTRAST CT CERVICAL SPINE WITHOUT CONTRAST TECHNIQUE: Multidetector CT imaging of the head and cervical spine was performed following the standard protocol without intravenous contrast. Multiplanar CT image reconstructions of the cervical spine were also generated. RADIATION DOSE REDUCTION: This exam was performed according to the departmental dose-optimization program which includes automated exposure control, adjustment of the mA and/or kV according to patient size and/or use of iterative reconstruction technique. COMPARISON:  None Available. FINDINGS: CT HEAD FINDINGS Brain: No evidence of acute infarction, hemorrhage, hydrocephalus, extra-axial collection or mass lesion/mass effect. Vascular: No hyperdense vessel or unexpected calcification. Skull: Normal. Negative for fracture or focal lesion. Sinuses/Orbits: No acute finding. Other: Mastoid air cells and middle ear cavities are clear. Small frontal scalp hematoma with punctate foci of subcutaneous gas. CT CERVICAL SPINE FINDINGS Alignment: Normal. Skull base and vertebrae: Craniocervical alignment is normal. The atlantodental interval  is not widened. No acute fracture of the cervical spine. There is ankylosis of the left facets of C2-3 as well as the facet joints bilaterally and vertebral bodies of C4-C6. Soft tissues and spinal canal: No prevertebral fluid or swelling. No visible canal hematoma. Disc levels: There is disc space narrowing, disc calcification, and endplate remodeling throughout the cervical spine in keeping with changes of advanced degenerative disc disease, most severe at C6-T1. Prevertebral soft tissues are not thickened on sagittal reformats. No high-grade canal stenosis. Multilevel uncovertebral and facet arthrosis results in moderate to severe bilateral neuroforaminal narrowing at C6-7 and moderate right neuroforaminal narrowing at C7-T1. Upper chest: Negative. Other: None IMPRESSION: 1. No acute intracranial abnormality. No calvarial fracture. Small frontal scalp hematoma. 2. No acute  fracture or listhesis of the cervical spine. 3. 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 Electronically Signed   By: Helyn Numbers M.D.   On: 11/03/2022 23:57    Procedures .Marland KitchenLaceration Repair  Date/Time: 11/04/2022 1:21 AM  Performed by: Tilden Fossa, MD Authorized by: Tilden Fossa, MD   Consent:    Consent obtained:  Verbal   Consent given by:  Patient   Risks discussed:  Infection, pain, poor cosmetic result, need for additional repair and poor wound healing Universal protocol:    Patient identity confirmed:  Verbally with patient Anesthesia:    Anesthesia method:  Local infiltration   Local anesthetic:  Lidocaine 2% WITH epi Laceration details:    Location:  Face   Face location:  Forehead   Length (cm):  7 Exploration:    Hemostasis achieved with:  Direct pressure   Wound extent: no foreign body and no underlying fracture   Treatment:    Area cleansed  with:  Povidone-iodine and saline   Amount of cleaning:  Standard   Debridement:  Moderate Skin repair:    Repair method:  Sutures   Suture size:  4-0   Suture material:  Prolene   Suture technique:  Simple interrupted   Number of sutures:  9 Approximation:    Approximation:  Close Repair type:    Repair type:  Intermediate Post-procedure details:    Dressing:  Antibiotic ointment   Procedure completion:  Tolerated well, no immediate complications     Medications Ordered in ED Medications  lidocaine-EPINEPHrine (XYLOCAINE W/EPI) 2 %-1:100000 (with pres) injection 20 mL (has no administration in time range)  acetaminophen (TYLENOL) tablet 650 mg (has no administration in time range)    ED Course/ Medical Decision Making/ A&P                             Medical Decision Making Amount and/or Complexity of Data Reviewed Labs: ordered. Radiology: ordered.  Risk Prescription drug management.   Patient with history of psoriatic arthritis, neuropathy and recent diagnosis of thrombocytopenia here for evaluation of injuries following a mechanical fall.  She has a large facial laceration that is irregular on evaluation as well as large hematomas to her forearms.  CT head and C-spine are negative for acute traumatic injuries.  CBC with thrombocytopenia, similar when compared to priors and care everywhere.  Wound repaired per note.  She does have hematomas to her arms but no clinical evidence of fracture she has no pain to her arms and is able to range them fully.  Discussed with patient with daughter at the bedside wound care, outpatient follow-up and return precautions.        Final Clinical Impression(s) / ED Diagnoses Final diagnoses:  Fall, initial encounter  Facial laceration, initial encounter  Thrombocytopenia [facility])    Rx / DC Orders ED Discharge Orders     None         Tilden Fossa, MD 11/04/22 [phone]

## 2022-11-04 DIAGNOSIS — S0181XA Laceration without foreign body of other part of head, initial encounter: Secondary | ICD-10-CM | POA: Diagnosis not present

## 2022-11-04 LAB — CBC WITH DIFFERENTIAL/PLATELET
Abs Immature Granulocytes: 0 10*3/uL (ref 0.00–0.07)
Basophils Absolute: 0 10*3/uL (ref 0.0–0.1)
Basophils Relative: 0 %
Eosinophils Absolute: 0 10*3/uL (ref 0.0–0.5)
Eosinophils Relative: 0 %
HCT: 38.6 % (ref 36.0–46.0)
Hemoglobin: 12.4 g/dL (ref 12.0–15.0)
Immature Granulocytes: 0 %
Lymphocytes Relative: 46 %
Lymphs Abs: 1.3 10*3/uL (ref 0.7–4.0)
MCH: 32.6 pg (ref 26.0–34.0)
MCHC: 32.1 g/dL (ref 30.0–36.0)
MCV: 101.6 fL — ABNORMAL HIGH (ref 80.0–100.0)
Monocytes Absolute: 0.3 10*3/uL (ref 0.1–1.0)
Monocytes Relative: 9 %
Neutro Abs: 1.3 10*3/uL — ABNORMAL LOW (ref 1.7–7.7)
Neutrophils Relative %: 45 %
Platelets: 76 10*3/uL — ABNORMAL LOW (ref 150–400)
RBC: 3.8 MIL/uL — ABNORMAL LOW (ref 3.87–5.11)
RDW: 17.3 % — ABNORMAL HIGH (ref 11.5–15.5)
WBC: 2.8 10*3/uL — ABNORMAL LOW (ref 4.0–10.5)
nRBC: 0 % (ref 0.0–0.2)

## 2022-11-04 MED ORDER — BACITRACIN ZINC 500 UNIT/GM EX OINT
TOPICAL_OINTMENT | Freq: Once | CUTANEOUS | Status: AC
  Filled 2022-11-04: qty 0.9

## 2022-11-04 MED ORDER — ACETAMINOPHEN 325 MG PO TABS
650.0000 mg | ORAL_TABLET | Freq: Once | ORAL | Status: AC
  Administered 2022-11-04: 650 mg via ORAL
  Filled 2022-11-04: qty 2

## 2022-11-04 NOTE — Discharge Instructions (Addendum)
You had 9 sutures placed in your forehead today.  Please have these removed in the next 5 to 7 days.  Your platelet count was 76 today.  Please follow-up with your family doctor for recheck.

## 2022-11-14 ENCOUNTER — Telehealth: Payer: Self-pay

## 2022-11-14 NOTE — Telephone Encounter (Signed)
Transition Care Management Unsuccessful Follow-up Telephone Call  Date of discharge and from where:  11/04/2022 [facility]  Attempts:  1st Attempt  Reason for unsuccessful TCM follow-up call:  Left voice message   [facility]  [facility] Population Health Community Resource Care Guide   [email or web address]  ?? [phone]   [email or web address]: [email or web address]  [email or web address]

## 2022-11-15 ENCOUNTER — Encounter: Payer: Self-pay | Admitting: Allergy & Immunology

## 2022-11-15 ENCOUNTER — Telehealth: Payer: Self-pay

## 2022-11-15 ENCOUNTER — Other Ambulatory Visit: Payer: Self-pay

## 2022-11-15 ENCOUNTER — Ambulatory Visit (INDEPENDENT_AMBULATORY_CARE_PROVIDER_SITE_OTHER): Payer: Medicare Other | Admitting: Allergy & Immunology

## 2022-11-15 VITALS — BP 110/62 | HR 77 | Temp 98.5°F | Ht 68.0 in | Wt 191.0 lb

## 2022-11-15 DIAGNOSIS — J302 Other seasonal allergic rhinitis: Secondary | ICD-10-CM

## 2022-11-15 DIAGNOSIS — H1013 Acute atopic conjunctivitis, bilateral: Secondary | ICD-10-CM | POA: Diagnosis not present

## 2022-11-15 DIAGNOSIS — H1010 Acute atopic conjunctivitis, unspecified eye: Secondary | ICD-10-CM

## 2022-11-15 DIAGNOSIS — D696 Thrombocytopenia, unspecified: Secondary | ICD-10-CM

## 2022-11-15 DIAGNOSIS — J4540 Moderate persistent asthma, uncomplicated: Secondary | ICD-10-CM

## 2022-11-15 DIAGNOSIS — J3089 Other allergic rhinitis: Secondary | ICD-10-CM

## 2022-11-15 MED ORDER — BREZTRI AEROSPHERE 160-9-4.8 MCG/ACT IN AERO: 2.0000 | INHALATION_SPRAY | Freq: Two times a day (BID) | RESPIRATORY_TRACT | 12 refills | Status: AC

## 2022-11-15 MED ORDER — EPINEPHRINE 0.3 MG/0.3ML IJ SOAJ: 0.3000 mg | INTRAMUSCULAR | 2 refills | Status: AC | PRN

## 2022-11-15 NOTE — Progress Notes (Signed)
FOLLOW UP  Date of Service/Encounter:  11/15/22   Assessment:   Moderate persistent asthma, uncomplicated - previously on Xolair, but now stable without it   Seasonal perennial allergic rhinitis   Tinnitus - followed by Dr. Marene Lenz   Thrombocytopenia - has hematology appointment pending   Complicated past medical history including rheumatoid arthritis as well as diverticulitis  Plan/Recommendations:   1. Moderate persistent asthma, uncomplicated - Lung testing looked largely normal.  - We are going to try to get free drug through [employer]. - We might have to change you to [facility] instead if we do that (Symbicort + another medication to relax your lung muscles). - [employer] stopped accepting new Symbicort requests for the [employer] Program.  - Samples of Breztri provided to make sure that you tolerate this without a problem.  - Daily controller medication(s): Singulair 10mg  daily and Symbicort 160/4.98mcg two puffs twice daily with spacer - Prior to physical activity: albuterol 2 puffs 10-15 minutes before physical activity. - Rescue medications: albuterol 4 puffs every 4-6 hours as needed - Asthma control goals:  * Full participation in all desired activities (may need albuterol before activity) * Albuterol use two time or less a week on average (not counting use with activity) * Cough interfering with sleep two time or less a month * Oral steroids no more than once a year * No hospitalizations  2. Seasonal and perennial allergic rhinitis - Continue with Claritin or Allegra once daily. - Continue with Singulair (montelukast) 10mg  daily - Continue with Dymista two sprays per nostril twice daily as needed.  - I think a lot of the itching in your eyes might be related to the breakdown of your blood from your recent fall.  - You can try using an over the counter eye drop at home.   3. Return in about 6 months (around 05/18/2023).    Subjective:   [name] [name]  is a 77 y.o. female presenting today for follow up of  Chief Complaint  Patient presents with   Follow-up    C/o itchy watery red eyes x 2 weeks    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/02/2021   SOB (shortness of breath) 12/08/2020   Pericardial effusion 12/08/2020   Dry eyes/dry mouth 05/25/2019   Bronchitis, mucopurulent recurrent (HCC) 02/26/2019   Moderate persistent asthma without complication 02/24/2019   Perennial and seasonal allergic rhinitis 02/24/2019   Seasonal and perennial allergic rhinoconjunctivitis 02/24/2019   Primary osteoarthritis of both knees 04/10/2016   Acute chest pain 08/19/2014   Costochondritis 08/19/2014   Lung nodule 08/19/2014   HTN (hypertension) 08/07/2012   HLD (hyperlipidemia) 09/26/2011   Asthma 09/06/2010   Depression 09/06/2010   History of allergy 09/06/2010   Hypothyroidism 09/06/2010   Lower back pain 09/06/2010   Arthritis 09/06/2010   Pain in joint, pelvic region and thigh 09/06/2010   Peripheral neuropathy 09/06/2010   Psoriasis 09/06/2010   Therapeutic drug monitoring 09/06/2010   Tinnitus 09/06/2010   Vitamin B 12 deficiency 09/06/2010   Cervical spondylosis without myelopathy 04/24/2006   Tear of lateral cartilage or meniscus of knee, current 03/13/2006    History obtained from: chart review and patient.  [name] [name] is a 77 y.o. female presenting for a follow up visit.  She was last seen  in November 2023.  At that time, her lung testing looked very normal.  We changed her to [facility] instead of Symbicort because it seemed to be a lower tier on her insurance.  We continue with Singulair 10 mg daily.  She has Flovent that she has twice daily during high pollen times of the year.  For her allergic rhinitis, we continue with Claritin or Allegra as well as Singulair and  Dymista.  Since last visit, she has largely done well. She did recently experience a fall. She has a bruised face today. She was getting up from the toilet and tried to grab the wall. This occurred relatively recently around last Saturday. This was on 5/18 and she had five stitches in her head. She did not lose consciousness thankfully. She had a head CT that was normal.   Asthma/Respiratory Symptom History: Breathing is under good control.  She never did change to the [facility] for some reason. She is not sure. Symbicort is around $40 per month. She is doing two puffs twice daily, but sometimes she does not need it and she stops it. She uses it during the spring season when symptoms are intense, but there are days when she does not feel that she needs it. She thinks that an inhaler lasts her around 6 weeks or so. 90 day supplies are no cheaper at this point in time. She has not been using her rescue inhaler much at all. She has not needed steroids for her breathing. She no longer uses the Flovent at all.  Allergic Rhinitis Symptom History: She cannot remember the last time that she had a sinus infection. She remains on the Dymista two sprays per nostril.    She has been having a lot of especially right sided ey swelling and itching. This is follow her trauma. She actually has a history of thrombocytopenia for two months (hematology appointment pending). She is seeing Dr. Al Pimple on June 8th for an evaluation).   She remains on Simponi for her rheumatoid arthritis. She is followed by Dr. Lendon Colonel for Rheumatology. This is every two month infusions.   Her husband is now living in [place], [place]. He is now living there. He tends to not sit still. He is living in an independent living place. They see each other around for major life events. They were apart for a year last time they saw each other.   Otherwise, there have been no changes to her past medical history, surgical history, family history, or social  history.    Review of Systems  Constitutional: Negative.  Negative for chills, fever, malaise/fatigue and weight loss.  HENT:  Negative for congestion, ear discharge, ear pain, sinus pain and tinnitus.   Eyes:  Negative for pain, discharge and redness.  Respiratory:  Negative for cough, sputum production, shortness of breath and wheezing.   Cardiovascular: Negative.  Negative for chest pain and palpitations.  Gastrointestinal:  Negative for abdominal pain, constipation, diarrhea, heartburn, nausea and vomiting.  Skin: Negative.  Negative for itching and rash.  Neurological:  Negative for dizziness and headaches.  Endo/Heme/Allergies:  Positive for environmental allergies. Does not bruise/bleed easily.       Objective:   Blood pressure 110/62, pulse 77, temperature 98.5 F (36.9 C), height 5\' 8"  (1.727 m), weight 191 lb (86.6 kg), SpO2 96 %. Body mass index is 29.04 kg/m.    Physical Exam Vitals reviewed.  Constitutional:      Appearance: She is well-developed.     Comments:  Bruising on her face bilaterally.   HENT:     Head: Normocephalic and atraumatic.     Right Ear: Tympanic membrane, ear canal and external ear normal.     Left Ear: Tympanic membrane, ear canal and external ear normal.     Nose: No nasal deformity, septal deviation, mucosal edema or rhinorrhea.     Right Turbinates: Enlarged, swollen and pale.     Left Turbinates: Enlarged, swollen and pale.     Right Sinus: No maxillary sinus tenderness or frontal sinus tenderness.     Left Sinus: No maxillary sinus tenderness or frontal sinus tenderness.     Mouth/Throat:     Lips: Pink.     Mouth: Mucous membranes are moist. Mucous membranes are not pale and not dry.     Pharynx: Uvula midline.     Comments: Cobblestoning in the posterior oropharynx.  Eyes:     General: Lids are normal. No allergic shiner.       Right eye: No discharge.        Left eye: No discharge.     Conjunctiva/sclera: Conjunctivae normal.      Right eye: Right conjunctiva is not injected. No chemosis.    Left eye: Left conjunctiva is not injected. No chemosis.    Pupils: Pupils are equal, round, and reactive to light.  Cardiovascular:     Rate and Rhythm: Normal rate and regular rhythm.     Heart sounds: Normal heart sounds.  Pulmonary:     Effort: Pulmonary effort is normal. No tachypnea, accessory muscle usage or respiratory distress.     Breath sounds: Normal breath sounds. No wheezing, rhonchi or rales.     Comments: Moving air well in all lung fields. No increased work of breathing noted.  Chest:     Chest wall: No tenderness.  Lymphadenopathy:     Cervical: No cervical adenopathy.  Skin:    General: Skin is warm.     Capillary Refill: Capillary refill takes less than 2 seconds.     Coloration: Skin is not pale.     Findings: No abrasion, erythema, petechiae or rash. Rash is not papular, urticarial or vesicular.     Comments: No eczematous or urticarial lesions noted.   Neurological:     Mental Status: She is alert.  Psychiatric:        Behavior: Behavior is cooperative.      Diagnostic studies:  none (due to recent fall)       Malachi Bonds, MD  [facility]

## 2022-11-15 NOTE — Telephone Encounter (Signed)
Patient plans to fill out the patient information portion of the [employer] forms (Page 1) and bring it back to the [place] office.    Script has been signed by Dr. Dellis Anes and Provider portion has been filled out. Script and provider form has been placed in pending and will be faxed with page 1 once form is returned by the patient.

## 2022-11-15 NOTE — Patient Instructions (Addendum)
1. Moderate persistent asthma, uncomplicated - Lung testing looked largely normal.  - We are going to try to get free drug through [employer]. - We might have to change you to [facility] instead if we do that (Symbicort + another medication to relax your lung muscles). - [employer] stopped accepting new Symbicort requests for the [employer] Program.  - Samples of Breztri provided to make sure that you tolerate this without a problem.  - Daily controller medication(s): Singulair 10mg  daily and Symbicort 160/4.70mcg two puffs twice daily with spacer - Prior to physical activity: albuterol 2 puffs 10-15 minutes before physical activity. - Rescue medications: albuterol 4 puffs every 4-6 hours as needed - Asthma control goals:  * Full participation in all desired activities (may need albuterol before activity) * Albuterol use two time or less a week on average (not counting use with activity) * Cough interfering with sleep two time or less a month * Oral steroids no more than once a year * No hospitalizations  2. Seasonal and perennial allergic rhinitis - Continue with Claritin or Allegra once daily. - Continue with Singulair (montelukast) 10mg  daily - Continue with Dymista two sprays per nostril twice daily as needed.  - I think a lot of the itching in your eyes might be related to the breakdown of your blood from your recent fall.  - You can try using an over the counter eye drop at home.   3. Return in about 6 months (around 05/18/2023).    Please inform us of any Emergency Department visits, hospitalizations, or changes in symptoms. Call us before going to the ED for breathing or allergy symptoms since we might be able to fit you in for a sick visit. Feel free to contact us anytime with any questions, problems, or concerns.  It was a pleasure to see you again today!  Websites that have reliable patient information: 1. American Academy of Asthma, Allergy, and Immunology: www.aaaai.org 2. Food  Allergy Research and Education (FARE): foodallergy.org 3. Mothers of Asthmatics: http://www.asthmacommunitynetwork.org 4. American College of Allergy, Asthma, and Immunology: www.acaai.org   COVID-19 Vaccine Information can be found at: [email or web address].[email or web address] For questions related to vaccine distribution or appointments, please email [email or web address] or call [phone].   We realize that you might be concerned about having an allergic reaction to the COVID19 vaccines. To help with that concern, WE ARE OFFERING THE COVID19 VACCINES IN OUR OFFICE! Ask the front desk for dates!     "Like" [place] on Facebook and Instagram for our latest updates!      A healthy democracy works best when [employer] participate! Make sure you are registered to vote! If you have moved or changed any of your contact information, you will need to get this updated before voting!  In some cases, you MAY be able to register to vote online: [email or web address]

## 2022-11-16 ENCOUNTER — Telehealth: Payer: Self-pay

## 2022-11-16 NOTE — Telephone Encounter (Signed)
Transition Care Management Follow-up Telephone Call Date of discharge and from where: 11/04/2022 [facility] How have you been since you were released from the hospital? Patient is feeling much better Any questions or concerns? No  Items Reviewed: Did the pt receive and understand the discharge instructions provided? Yes  Medications obtained and verified? Yes  Other? No  Any new allergies since your discharge? No  Dietary orders reviewed? Yes Do you have support at home? Yes   Follow up appointments reviewed:  PCP Hospital f/u appt confirmed?  Patient stated she followed up with PCP had stitches removed.  Scheduled to see  on  @ . Specialist Hospital f/u appt confirmed? No  Scheduled to see  on  @ . Are transportation arrangements needed? No  If their condition worsens, is the pt aware to call PCP or go to the Emergency Dept.? Yes Was the patient provided with contact information for the PCP's office or ED? Yes Was to pt encouraged to call back with questions or concerns? Yes   [facility]  [facility] Population Health Community Resource Care Guide   [email or web address]  ?? [phone]   Website: [email or web address]  [email or web address]

## 2022-11-24 ENCOUNTER — Inpatient Hospital Stay: Payer: Medicare Other

## 2022-11-24 ENCOUNTER — Inpatient Hospital Stay: Payer: Medicare Other | Attending: Hematology and Oncology | Admitting: Hematology and Oncology

## 2022-11-24 VITALS — BP 122/68 | HR 74 | Temp 98.0°F | Resp 15 | Ht 68.0 in | Wt 189.2 lb

## 2022-11-24 DIAGNOSIS — R42 Dizziness and giddiness: Secondary | ICD-10-CM | POA: Insufficient documentation

## 2022-11-24 DIAGNOSIS — Z840 Family history of diseases of the skin and subcutaneous tissue: Secondary | ICD-10-CM | POA: Insufficient documentation

## 2022-11-24 DIAGNOSIS — Z79899 Other long term (current) drug therapy: Secondary | ICD-10-CM | POA: Insufficient documentation

## 2022-11-24 DIAGNOSIS — Z825 Family history of asthma and other chronic lower respiratory diseases: Secondary | ICD-10-CM | POA: Insufficient documentation

## 2022-11-24 DIAGNOSIS — Z818 Family history of other mental and behavioral disorders: Secondary | ICD-10-CM | POA: Diagnosis not present

## 2022-11-24 DIAGNOSIS — G629 Polyneuropathy, unspecified: Secondary | ICD-10-CM | POA: Insufficient documentation

## 2022-11-24 DIAGNOSIS — Z87891 Personal history of nicotine dependence: Secondary | ICD-10-CM | POA: Diagnosis not present

## 2022-11-24 DIAGNOSIS — D696 Thrombocytopenia, unspecified: Secondary | ICD-10-CM

## 2022-11-24 DIAGNOSIS — M4803 Spinal stenosis, cervicothoracic region: Secondary | ICD-10-CM | POA: Diagnosis not present

## 2022-11-24 DIAGNOSIS — L4050 Arthropathic psoriasis, unspecified: Secondary | ICD-10-CM | POA: Diagnosis not present

## 2022-11-24 DIAGNOSIS — S0003XA Contusion of scalp, initial encounter: Secondary | ICD-10-CM | POA: Diagnosis not present

## 2022-11-24 DIAGNOSIS — R5382 Chronic fatigue, unspecified: Secondary | ICD-10-CM | POA: Diagnosis not present

## 2022-11-24 DIAGNOSIS — Z8261 Family history of arthritis: Secondary | ICD-10-CM | POA: Diagnosis not present

## 2022-11-24 DIAGNOSIS — X58XXXA Exposure to other specified factors, initial encounter: Secondary | ICD-10-CM | POA: Insufficient documentation

## 2022-11-24 DIAGNOSIS — M47812 Spondylosis without myelopathy or radiculopathy, cervical region: Secondary | ICD-10-CM | POA: Insufficient documentation

## 2022-11-24 DIAGNOSIS — J45909 Unspecified asthma, uncomplicated: Secondary | ICD-10-CM | POA: Insufficient documentation

## 2022-11-24 DIAGNOSIS — K068 Other specified disorders of gingiva and edentulous alveolar ridge: Secondary | ICD-10-CM | POA: Insufficient documentation

## 2022-11-24 DIAGNOSIS — M50323 Other cervical disc degeneration at C6-C7 level: Secondary | ICD-10-CM | POA: Insufficient documentation

## 2022-11-24 DIAGNOSIS — E039 Hypothyroidism, unspecified: Secondary | ICD-10-CM | POA: Insufficient documentation

## 2022-11-24 DIAGNOSIS — Z8489 Family history of other specified conditions: Secondary | ICD-10-CM | POA: Insufficient documentation

## 2022-11-24 DIAGNOSIS — D72819 Decreased white blood cell count, unspecified: Secondary | ICD-10-CM | POA: Diagnosis not present

## 2022-11-24 DIAGNOSIS — Z79890 Hormone replacement therapy (postmenopausal): Secondary | ICD-10-CM | POA: Diagnosis not present

## 2022-11-24 LAB — CBC WITH DIFFERENTIAL/PLATELET
Abs Immature Granulocytes: 0 10*3/uL (ref 0.00–0.07)
Basophils Absolute: 0 10*3/uL (ref 0.0–0.1)
Basophils Relative: 0 %
Eosinophils Absolute: 0 10*3/uL (ref 0.0–0.5)
Eosinophils Relative: 0 %
HCT: 38.8 % (ref 36.0–46.0)
Hemoglobin: 12.3 g/dL (ref 12.0–15.0)
Immature Granulocytes: 0 %
Lymphocytes Relative: 56 %
Lymphs Abs: 1.4 10*3/uL (ref 0.7–4.0)
MCH: 31.9 pg (ref 26.0–34.0)
MCHC: 31.7 g/dL (ref 30.0–36.0)
MCV: 100.8 fL — ABNORMAL HIGH (ref 80.0–100.0)
Monocytes Absolute: 0.4 10*3/uL (ref 0.1–1.0)
Monocytes Relative: 15 %
Neutro Abs: 0.7 10*3/uL — ABNORMAL LOW (ref 1.7–7.7)
Neutrophils Relative %: 29 %
Platelets: 89 10*3/uL — ABNORMAL LOW (ref 150–400)
RBC: 3.85 MIL/uL — ABNORMAL LOW (ref 3.87–5.11)
RDW: 17.2 % — ABNORMAL HIGH (ref 11.5–15.5)
WBC: 2.5 10*3/uL — ABNORMAL LOW (ref 4.0–10.5)
nRBC: 0 % (ref 0.0–0.2)

## 2022-11-24 LAB — RETICULOCYTES
Immature Retic Fract: 17.7 % — ABNORMAL HIGH (ref 2.3–15.9)
RBC.: 3.81 MIL/uL — ABNORMAL LOW (ref 3.87–5.11)
Retic Count, Absolute: 48.4 10*3/uL (ref 19.0–186.0)
Retic Ct Pct: 1.3 % (ref 0.4–3.1)

## 2022-11-24 LAB — TECHNOLOGIST SMEAR REVIEW: Plt Morphology: DECREASED

## 2022-11-24 LAB — APTT: aPTT: 29 seconds (ref 24–36)

## 2022-11-24 LAB — IRON AND IRON BINDING CAPACITY (CC-WL,HP ONLY)
Iron: 142 ug/dL (ref 28–170)
Saturation Ratios: 40 % — ABNORMAL HIGH (ref 10.4–31.8)
TIBC: 353 ug/dL (ref 250–450)
UIBC: 211 ug/dL (ref 148–442)

## 2022-11-24 LAB — VITAMIN B12: Vitamin B-12: 671 pg/mL (ref 180–914)

## 2022-11-24 LAB — PROTIME-INR
INR: 1.3 — ABNORMAL HIGH (ref 0.8–1.2)
Prothrombin Time: 16.3 seconds — ABNORMAL HIGH (ref 11.4–15.2)

## 2022-11-24 NOTE — Progress Notes (Signed)
[facility] CONSULT NOTE  Patient Care Team: [name], [name], MD as PCP - General (Internal Medicine)  CHIEF COMPLAINTS/PURPOSE OF CONSULTATION:  Thrombocytopenia.  ASSESSMENT & PLAN:   This is a very pleasant 77 yr old with past medical history significant for psoriatic arthritis, chronic methotrexate use, hypothyroidism, well-controlled last month referred to hematology for worsening thrombocytopenia.  Ms. [name] [name] complains of chronic fatigue and easy bruising and some occasional gum bleeding but otherwise denies any major changes.  She has been becoming more dizzy when she closes her eyes and rapidly changes position.  On physical exam, she appears in stated age, has no palpable lymphadenopathy or splenomegaly, chronic venous stasis changes in bilateral lower extremity, right greater than left.  I reviewed the labs scanned in the referral, she has had ongoing thrombocytopenia with some common saying that her platelets aggregate so the count could be higher than calculated.  Otherwise she was also found to have some leukopenia.  She has stopped methotrexate for the past month given the cytopenias.  We have discussed common causes of thrombocytopenia including but not limited to medications, viral illnesses, nutritional deficiencies, autoimmune diseases, bone marrow disorders etc.  If her CBC shows improving cytopenias today, this is most likely attributed to methotrexate use.  I have also ordered some additional labs to evaluate for nutritional deficiencies, coagulopathy.  She has underlying hypothyroidism and has been on levothyroxine 100 mcg, according to the patient this is well titrated hence have not repeated the TSH or the ANA today. We will do a smear review to try and get a better estimate of her platelet count.  I will plan to see her back in a few weeks again to review labs and to see if we have to proceed with bone marrow aspiration and biopsy.  She is agreeable with this  plan.  Thank you for consulting [place] in the care of this patient.  Please do not hesitate to contact us with any additional questions or concerns.  HISTORY OF PRESENTING ILLNESS:  [name] [name] 77 y.o. female is here because of thrombocytopenia.  This is a very pleasant 77 yr old female patient with psoriasis and related complications, asthma referred to hematology for thrombocytopenia. She complains of being fatigued and dizzy, has noticed easy bruising, some gum bleeding ( she describes it as a taste of blood when she brushes her teeth). NO major bleeding from anywhere except after this most recent fall when she had huge ecchymosis. No change in breathing, bowel habits or urinary habits No new headaches, double vision, but feels dizzy when she closes her eyes, bends over. 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 She drinks about 1/2 glasses of wine for several yrs now, no hard liquor. She hasn't been taking MTX for the past 4 weeks. She took it for about 15 yrs. Rest of the pertinent 10 point ROS reviewed and neg.  MEDICAL HISTORY:  Past Medical History:  Diagnosis Date   Asthma    Diverticulitis    Hypothyroid    Psoriatic arthritis (HCC)     SURGICAL HISTORY: Past Surgical History:  Procedure Laterality Date   COLONOSCOPY  08/2021   HIP SURGERY Bilateral    TONSILLECTOMY     TUBAL LIGATION      SOCIAL HISTORY: Social History   Socioeconomic History   Marital status: Married    Spouse name: Not on file   Number of children: Not on file   Years of education: Not  on file   Highest education level: Not on file  Occupational History   Not on file  Tobacco Use   Smoking status: Former    Packs/day: 0.10    Years: 20.00    Additional pack years: 0.00    Total pack years: 2.00    Types: Cigarettes    Start date: 78    Quit date: 11/18/2000    Years since quitting: 22.0   Smokeless tobacco: Never   Tobacco comments:    3 cigarettes a day  Vaping Use    Vaping Use: Never used  Substance and Sexual Activity   Alcohol use: Never   Drug use: Never   Sexual activity: Not Currently  Other Topics Concern   Not on file  Social History Narrative   Lives with children   Social Determinants of Health   Financial Resource Strain: Not on file  Food Insecurity: No Food Insecurity (11/24/2022)   Hunger Vital Sign    Worried About Running Out of Food in the Last Year: Never true    Ran Out of Food in the Last Year: Never true  Transportation Needs: Not on file  Physical Activity: Not on file  Stress: Not on file  Social Connections: Not on file  Intimate Partner Violence: Not on file    FAMILY HISTORY: Family History  Problem Relation Age of Onset   Asthma Mother    Allergic rhinitis Mother    Rheum arthritis Mother    Allergic rhinitis Father    Alzheimer's disease Father    Eczema Brother    Allergic rhinitis Brother    Colon cancer Neg Hx    Esophageal cancer Neg Hx    Stomach cancer Neg Hx    Rectal cancer Neg Hx     ALLERGIES:  is allergic to latex, cat hair extract, dust mite extract, molds & smuts, other, and pollen extract-tree extract [pollen extract].  MEDICATIONS:  Current Outpatient Medications  Medication Sig Dispense Refill   albuterol (VENTOLIN HFA) 108 (90 Base) MCG/ACT inhaler Inhale 2 puffs into the lungs every 6 (six) hours as needed for wheezing or shortness of breath. 8 g 6   Azelastine-Fluticasone 137-50 MCG/ACT SUSP Place 1 spray into the nose 2 (two) times daily as needed. 23 g 5   b complex vitamins tablet Take 1 tablet by mouth daily.     Budeson-Glycopyrrol-Formoterol (BREZTRI AEROSPHERE) 160-9-4.8 MCG/ACT AERO Inhale 2 puffs into the lungs in the morning and at bedtime. 1 each 12   budesonide-formoterol (SYMBICORT) 80-4.5 MCG/ACT inhaler Inhale 2 puffs into the lungs in the morning and at bedtime. 1 each 5   Cholecalciferol (VITAMIN D3) 25 MCG (1000 UT) CAPS Take by mouth.     EPINEPHrine 0.3 mg/0.3  mL IJ SOAJ injection Inject 0.3 mg into the muscle as needed for anaphylaxis. 2 each 2   famotidine (PEPCID) 40 MG tablet Take 1 tablet (40 mg total) by mouth daily. 30 tablet 5   gabapentin (NEURONTIN) 800 MG tablet 1 tablet     Golimumab (SIMPONI ARIA IV) Inject 206.4 mg into the vein as directed. Once every 8 weeks     influenza vac split quadrivalent PF (FLUARIX) 0.5 ML injection ADM 0.5ML IM UTD     ketoconazole (NIZORAL) 2 % cream Apply to bottoms of feet for 2-3 weeks 60 g 2   levothyroxine (SYNTHROID) 100 MCG tablet Take 100 mcg by mouth every morning.     loratadine (CLARITIN) 10 MG tablet 1 tablet  Magnesium 250 MG TABS Take 250 mg by mouth daily.     Methotrexate Sodium (METHOTREXATE, PF,) 50 MG/2ML injection 20 mg once a week.     montelukast (SINGULAIR) 10 MG tablet TAKE 1 TABLET(10 MG) BY MOUTH AT BEDTIME 30 tablet 2   neomycin-polymyxin b-dexamethasone (MAXITROL) 3.5-10000-0.1 OINT SMARTSIG:sparingly Left Eye Twice Daily     pantoprazole (PROTONIX) 40 MG tablet TAKE 1 TABLET(40 MG) BY MOUTH TWICE DAILY BEFORE A MEAL 180 tablet 0   traMADol (ULTRAM) 50 MG tablet      Turmeric 500 MG CAPS Take 1 capsule po qd     Current Facility-Administered Medications  Medication Dose Route Frequency Provider Last Rate Last Admin   0.9 %  sodium chloride infusion  500 mL Intravenous Once Tressia Danas, MD       omalizumab Geoffry Paradise) injection 375 mg  375 mg Subcutaneous Q14 Days Ellamae Sia, DO   375 mg at 04/24/21 1516   omalizumab Geoffry Paradise) prefilled syringe 375 mg  375 mg Subcutaneous Q14 Days Alfonse Spruce, MD   375 mg at 05/15/21 1017     PHYSICAL EXAMINATION: ECOG PERFORMANCE STATUS: 2 - Symptomatic, <50% confined to bed  Vitals:   11/24/22 1005  BP: 122/68  Pulse: 74  Resp: 15  Temp: 98 F (36.7 C)  SpO2: 98%   Filed Weights   11/24/22 1005  Weight: 189 lb 3.2 oz (85.8 kg)    GENERAL:alert, no distress and comfortable SKIN: Thin skin, ecchymosis  scattered. EYES: normal, conjunctiva are pink and non-injected, sclera clear OROPHARYNX:no exudate, no erythema and lips, buccal mucosa, and tongue normal  NECK: supple, thyroid normal size, non-tender, without nodularity LYMPH:  no palpable lymphadenopathy in the cervical, axillary  LUNGS: clear to auscultation and percussion with normal breathing effort HEART: regular rate & rhythm and no murmurs and no lower extremity edema ABDOMEN:abdomen soft, non-tender and normal bowel sounds Musculoskeletal: RLE slightly more swollen than the left, chronic venous stasis changes.PSYCH: alert & oriented x 3 with fluent speech NEURO: no focal motor/sensory deficits  LABORATORY DATA:  I have reviewed the data as listed Lab Results  Component Value Date   WBC 2.8 (L) 11/03/2022   HGB 12.4 11/03/2022   HCT 38.6 11/03/2022   MCV 101.6 (H) 11/03/2022   PLT 76 (L) 11/03/2022     Chemistry      Component Value Date/Time   NA 139 11/03/2022 2326   K 3.7 11/03/2022 2326   CL 108 11/03/2022 2326   CO2 23 11/03/2022 2326   BUN 12 11/03/2022 2326   CREATININE 0.51 11/03/2022 2326      Component Value Date/Time   CALCIUM 8.8 (L) 11/03/2022 2326       RADIOGRAPHIC STUDIES: I have personally reviewed the radiological images as listed and agreed with the findings in the report. CT Head Wo Contrast  Result Date: 11/03/2022 CLINICAL DATA:  Head trauma, moderate-severe; Neck trauma (Age >= 65y), fall EXAM: CT HEAD WITHOUT CONTRAST CT CERVICAL SPINE WITHOUT CONTRAST TECHNIQUE: Multidetector CT imaging of the head and cervical spine was performed following the standard protocol without intravenous contrast. Multiplanar CT image reconstructions of the cervical spine were also generated. RADIATION DOSE REDUCTION: This exam was performed according to the departmental dose-optimization program which includes automated exposure control, adjustment of the mA and/or kV according to patient size and/or use of  iterative reconstruction technique. COMPARISON:  None Available. FINDINGS: CT HEAD FINDINGS Brain: No evidence of acute infarction, hemorrhage, hydrocephalus, extra-axial collection or  mass lesion/mass effect. Vascular: No hyperdense vessel or unexpected calcification. Skull: Normal. Negative for fracture or focal lesion. Sinuses/Orbits: No acute finding. Other: Mastoid air cells and middle ear cavities are clear. Small frontal scalp hematoma with punctate foci of subcutaneous gas. CT CERVICAL SPINE FINDINGS Alignment: Normal. Skull base and vertebrae: Craniocervical alignment is normal. The atlantodental interval is not widened. No acute fracture of the cervical spine. There is ankylosis of the left facets of C2-3 as well as the facet joints bilaterally and vertebral bodies of C4-C6. Soft tissues and spinal canal: No prevertebral fluid or swelling. No visible canal hematoma. Disc levels: There is disc space narrowing, disc calcification, and endplate remodeling throughout the cervical spine in keeping with changes of advanced degenerative disc disease, most severe at C6-T1. Prevertebral soft tissues are not thickened on sagittal reformats. No high-grade canal stenosis. Multilevel uncovertebral and facet arthrosis results in moderate to severe bilateral neuroforaminal narrowing at C6-7 and moderate right neuroforaminal narrowing at C7-T1. Upper chest: Negative. Other: None IMPRESSION: 1. No acute intracranial abnormality. No calvarial fracture. Small frontal scalp hematoma. 2. No acute fracture or listhesis of the cervical spine. 3. 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 Electronically Signed   By: Helyn Numbers M.D.   On: 11/03/2022 23:57   CT Cervical Spine Wo Contrast  Result Date: 11/03/2022 CLINICAL DATA:  Head trauma, moderate-severe; Neck trauma  (Age >= 65y), fall EXAM: CT HEAD WITHOUT CONTRAST CT CERVICAL SPINE WITHOUT CONTRAST TECHNIQUE: Multidetector CT imaging of the head and cervical spine was performed following the standard protocol without intravenous contrast. Multiplanar CT image reconstructions of the cervical spine were also generated. RADIATION DOSE REDUCTION: This exam was performed according to the departmental dose-optimization program which includes automated exposure control, adjustment of the mA and/or kV according to patient size and/or use of iterative reconstruction technique. COMPARISON:  None Available. FINDINGS: CT HEAD FINDINGS Brain: No evidence of acute infarction, hemorrhage, hydrocephalus, extra-axial collection or mass lesion/mass effect. Vascular: No hyperdense vessel or unexpected calcification. Skull: Normal. Negative for fracture or focal lesion. Sinuses/Orbits: No acute finding. Other: Mastoid air cells and middle ear cavities are clear. Small frontal scalp hematoma with punctate foci of subcutaneous gas. CT CERVICAL SPINE FINDINGS Alignment: Normal. Skull base and vertebrae: Craniocervical alignment is normal. The atlantodental interval is not widened. No acute fracture of the cervical spine. There is ankylosis of the left facets of C2-3 as well as the facet joints bilaterally and vertebral bodies of C4-C6. Soft tissues and spinal canal: No prevertebral fluid or swelling. No visible canal hematoma. Disc levels: There is disc space narrowing, disc calcification, and endplate remodeling throughout the cervical spine in keeping with changes of advanced degenerative disc disease, most severe at C6-T1. Prevertebral soft tissues are not thickened on sagittal reformats. No high-grade canal stenosis. Multilevel uncovertebral and facet arthrosis results in moderate to severe bilateral neuroforaminal narrowing at C6-7 and moderate right neuroforaminal narrowing at C7-T1. Upper chest: Negative. Other: None IMPRESSION: 1. No acute  intracranial abnormality. No calvarial fracture. Small frontal scalp hematoma. 2. No acute fracture or listhesis of the cervical spine. 3. 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 Electronically Signed   By: Helyn Numbers M.D.   On: 11/03/2022 23:57    All questions were answered. The patient knows  to call the clinic with any problems, questions or concerns. I spent 45 minutes in the care of this patient including H and P, review of records, counseling and coordination of care.     Rachel Moulds, MD 11/24/2022 10:10 AM

## 2022-11-26 LAB — FOLATE RBC
Folate, Hemolysate: 542 ng/mL
Folate, RBC: 1390 ng/mL (ref >498)
Hematocrit: 39 % (ref 34.0–46.6)

## 2022-11-27 ENCOUNTER — Telehealth: Payer: Self-pay

## 2022-11-27 NOTE — Telephone Encounter (Signed)
-----   Message from Rachel Moulds, MD sent at 11/24/2022 12:23 PM EDT ----- Looks like despite stopping MTX, blood counts havent recovered, so may not be related to the drug. Can we set up a phone visit with me next week so I explain to her that we may need BMB.

## 2022-11-27 NOTE — Telephone Encounter (Signed)
S/w pt per MD. She was offered telephone visit with MD 6/19 at 1300 and she accepted this. MD aware.

## 2022-12-05 ENCOUNTER — Inpatient Hospital Stay (HOSPITAL_BASED_OUTPATIENT_CLINIC_OR_DEPARTMENT_OTHER): Payer: Medicare Other | Admitting: Hematology and Oncology

## 2022-12-05 DIAGNOSIS — D696 Thrombocytopenia, unspecified: Secondary | ICD-10-CM

## 2022-12-05 NOTE — Progress Notes (Signed)
[facility] CONSULT NOTE  Patient Care Team: [name], [name], MD as PCP - General (Internal Medicine)  CHIEF COMPLAINTS/PURPOSE OF CONSULTATION:  Thrombocytopenia.  ASSESSMENT & PLAN:   This is a very pleasant 77 yr old with past medical history significant for psoriatic arthritis, chronic methotrexate use, hypothyroidism, referred to hematology for evaluation of cytopenias.  She was not on methotrexate for at least 4 weeks when she came to the initial consult.  Labs from that day showed persistent cytopenias, leukopenia, thrombocytopenia with no improvement despite stopping the methotrexate.   She does have well-controlled psoriatic arthritis as well as well-controlled hypothyroidism.  Rest of the labs did not show overt nutritional deficiencies or hemolysis or major coagulopathy.  Since she was on methotrexate for a very long time and she has progressive cytopenias, I discussed today about a possible bone marrow aspiration and biopsy.  She unfortunately lost her husband this past weekend after a fall and head trauma.  She is grieving but agreeable to doing the bone marrow biopsy in the next few weeks. I will plan to see her after the bone marrow aspiration and biopsy to review the results and to discuss any additional recommendations.  HISTORY OF PRESENTING ILLNESS:  [name] [name] 77 y.o. female is here because of thrombocytopenia.  This is a very pleasant 77 yr old female patient with psoriasis and related complications, asthma referred to hematology for thrombocytopenia. This is a telephone visit to review the lab results from last visit here. Rest of the pertinent 10 point ROS reviewed and neg.  MEDICAL HISTORY:  Past Medical History:  Diagnosis Date   Asthma    Diverticulitis    Hypothyroid    Psoriatic arthritis (HCC)     SURGICAL HISTORY: Past Surgical History:  Procedure Laterality Date   COLONOSCOPY  08/2021   HIP SURGERY Bilateral    TONSILLECTOMY      TUBAL LIGATION      SOCIAL HISTORY: Social History   Socioeconomic History   Marital status: Married    Spouse name: Not on file   Number of children: Not on file   Years of education: Not on file   Highest education level: Not on file  Occupational History   Not on file  Tobacco Use   Smoking status: Former    Packs/day: 0.10    Years: 20.00    Additional pack years: 0.00    Total pack years: 2.00    Types: Cigarettes    Start date: 55    Quit date: 11/18/2000    Years since quitting: 22.0   Smokeless tobacco: Never   Tobacco comments:    3 cigarettes a day  Vaping Use   Vaping Use: Never used  Substance and Sexual Activity   Alcohol use: Never   Drug use: Never   Sexual activity: Not Currently  Other Topics Concern   Not on file  Social History Narrative   Lives with children   Social Determinants of Health   Financial Resource Strain: Not on file  Food Insecurity: No Food Insecurity (11/24/2022)   Hunger Vital Sign    Worried About Running Out of Food in the Last Year: Never true    Ran Out of Food in the Last Year: Never true  Transportation Needs: No Transportation Needs (11/24/2022)   PRAPARE - Administrator, Civil Service (Medical): No    Lack of Transportation (Non-Medical): No  Physical Activity: Not on file  Stress: Not on  file  Social Connections: Not on file  Intimate Partner Violence: Not At Risk (11/24/2022)   Humiliation, Afraid, Rape, and Kick questionnaire    Fear of Current or Ex-Partner: No    Emotionally Abused: No    Physically Abused: No    Sexually Abused: No    FAMILY HISTORY: Family History  Problem Relation Age of Onset   Asthma Mother    Allergic rhinitis Mother    Rheum arthritis Mother    Allergic rhinitis Father    Alzheimer's disease Father    Eczema Brother    Allergic rhinitis Brother    Colon cancer Neg Hx    Esophageal cancer Neg Hx    Stomach cancer Neg Hx    Rectal cancer Neg Hx     ALLERGIES:   is allergic to latex, cat hair extract, dust mite extract, molds & smuts, other, and pollen extract-tree extract [pollen extract].  MEDICATIONS:  Current Outpatient Medications  Medication Sig Dispense Refill   albuterol (VENTOLIN HFA) 108 (90 Base) MCG/ACT inhaler Inhale 2 puffs into the lungs every 6 (six) hours as needed for wheezing or shortness of breath. 8 g 6   Azelastine-Fluticasone 137-50 MCG/ACT SUSP Place 1 spray into the nose 2 (two) times daily as needed. 23 g 5   b complex vitamins tablet Take 1 tablet by mouth daily.     Budeson-Glycopyrrol-Formoterol (BREZTRI AEROSPHERE) 160-9-4.8 MCG/ACT AERO Inhale 2 puffs into the lungs in the morning and at bedtime. 1 each 12   budesonide-formoterol (SYMBICORT) 80-4.5 MCG/ACT inhaler Inhale 2 puffs into the lungs in the morning and at bedtime. 1 each 5   Cholecalciferol (VITAMIN D3) 25 MCG (1000 UT) CAPS Take by mouth.     EPINEPHrine 0.3 mg/0.3 mL IJ SOAJ injection Inject 0.3 mg into the muscle as needed for anaphylaxis. 2 each 2   famotidine (PEPCID) 40 MG tablet Take 1 tablet (40 mg total) by mouth daily. 30 tablet 5   gabapentin (NEURONTIN) 800 MG tablet 1 tablet     Golimumab (SIMPONI ARIA IV) Inject 206.4 mg into the vein as directed. Once every 8 weeks     influenza vac split quadrivalent PF (FLUARIX) 0.5 ML injection ADM 0.5ML IM UTD     ketoconazole (NIZORAL) 2 % cream Apply to bottoms of feet for 2-3 weeks 60 g 2   levothyroxine (SYNTHROID) 100 MCG tablet Take 100 mcg by mouth every morning.     loratadine (CLARITIN) 10 MG tablet 1 tablet     Magnesium 250 MG TABS Take 250 mg by mouth daily.     Methotrexate Sodium (METHOTREXATE, PF,) 50 MG/2ML injection 20 mg once a week.     montelukast (SINGULAIR) 10 MG tablet TAKE 1 TABLET(10 MG) BY MOUTH AT BEDTIME 30 tablet 2   neomycin-polymyxin b-dexamethasone (MAXITROL) 3.5-10000-0.1 OINT SMARTSIG:sparingly Left Eye Twice Daily     pantoprazole (PROTONIX) 40 MG tablet TAKE 1 TABLET(40 MG)  BY MOUTH TWICE DAILY BEFORE A MEAL 180 tablet 0   traMADol (ULTRAM) 50 MG tablet      Current Facility-Administered Medications  Medication Dose Route Frequency Provider Last Rate Last Admin   0.9 %  sodium chloride infusion  500 mL Intravenous Once Tressia Danas, MD       omalizumab Geoffry Paradise) injection 375 mg  375 mg Subcutaneous Q14 Days Ellamae Sia, DO   375 mg at 04/24/21 1516   omalizumab Geoffry Paradise) prefilled syringe 375 mg  375 mg Subcutaneous Q14 Days Alfonse Spruce, MD  375 mg at 05/15/21 1017     PHYSICAL EXAMINATION: ECOG PERFORMANCE STATUS: 2 - Symptomatic, <50% confined to bed  There were no vitals filed for this visit.  There were no vitals filed for this visit.  PE not done, telephone visit.  LABORATORY DATA:  I have reviewed the data as listed Lab Results  Component Value Date   WBC 2.5 (L) 11/24/2022   HGB 12.3 11/24/2022   HCT 38.8 11/24/2022   HCT 39.0 11/24/2022   MCV 100.8 (H) 11/24/2022   PLT 89 (L) 11/24/2022     Chemistry      Component Value Date/Time   NA 139 11/03/2022 2326   K 3.7 11/03/2022 2326   CL 108 11/03/2022 2326   CO2 23 11/03/2022 2326   BUN 12 11/03/2022 2326   CREATININE 0.51 11/03/2022 2326      Component Value Date/Time   CALCIUM 8.8 (L) 11/03/2022 2326       RADIOGRAPHIC STUDIES: I have personally reviewed the radiological images as listed and agreed with the findings in the report. No results found.   I connected with  [name] [name] on 12/05/22 by a telephone application and verified that I am speaking with the correct person using two identifiers.   I discussed the limitations of evaluation and management by telemedicine. The patient expressed understanding and agreed to proceed.  All questions were answered. The patient knows to call the clinic with any problems, questions or concerns. I spent 12 minutes in the care of this patient including H and P, review of records, counseling and coordination of  care.     Rachel Moulds, MD 12/05/2022 1:07 PM

## 2022-12-11 ENCOUNTER — Inpatient Hospital Stay: Payer: Medicare Other | Admitting: Hematology and Oncology

## 2022-12-21 ENCOUNTER — Telehealth: Payer: Self-pay | Admitting: *Deleted

## 2022-12-21 NOTE — Telephone Encounter (Signed)
[facility] scheduled for 01/07/2023 per bed and [facility]/DNP availability. Spoke with Marchelle Folks in [employer].  Spoke with pt who verbalized understanding.  Scheduling request sent per above.

## 2022-12-24 ENCOUNTER — Inpatient Hospital Stay: Payer: Medicare Other

## 2023-01-07 ENCOUNTER — Telehealth: Payer: Self-pay | Admitting: Hematology and Oncology

## 2023-01-07 ENCOUNTER — Inpatient Hospital Stay: Payer: Medicare Other

## 2023-01-07 ENCOUNTER — Other Ambulatory Visit: Payer: Self-pay

## 2023-01-07 ENCOUNTER — Inpatient Hospital Stay: Payer: Medicare Other | Attending: Hematology and Oncology | Admitting: Adult Health

## 2023-01-07 VITALS — BP 119/63 | HR 66 | Temp 98.1°F | Resp 16

## 2023-01-07 DIAGNOSIS — D696 Thrombocytopenia, unspecified: Secondary | ICD-10-CM | POA: Diagnosis not present

## 2023-01-07 DIAGNOSIS — D61818 Other pancytopenia: Secondary | ICD-10-CM | POA: Diagnosis not present

## 2023-01-07 DIAGNOSIS — Z79899 Other long term (current) drug therapy: Secondary | ICD-10-CM | POA: Diagnosis not present

## 2023-01-07 LAB — CBC WITH DIFFERENTIAL/PLATELET
Abs Immature Granulocytes: 0.01 10*3/uL (ref 0.00–0.07)
Basophils Absolute: 0 10*3/uL (ref 0.0–0.1)
Basophils Relative: 0 %
Eosinophils Absolute: 0 10*3/uL (ref 0.0–0.5)
Eosinophils Relative: 0 %
HCT: 34.6 % — ABNORMAL LOW (ref 36.0–46.0)
Hemoglobin: 11.1 g/dL — ABNORMAL LOW (ref 12.0–15.0)
Immature Granulocytes: 0 %
Lymphocytes Relative: 53 %
Lymphs Abs: 1.2 10*3/uL (ref 0.7–4.0)
MCH: 31.5 pg (ref 26.0–34.0)
MCHC: 32.1 g/dL (ref 30.0–36.0)
MCV: 98.3 fL (ref 80.0–100.0)
Monocytes Absolute: 0.5 10*3/uL (ref 0.1–1.0)
Monocytes Relative: 20 %
Neutro Abs: 0.6 10*3/uL — ABNORMAL LOW (ref 1.7–7.7)
Neutrophils Relative %: 27 %
Platelets: 61 10*3/uL — ABNORMAL LOW (ref 150–400)
RBC: 3.52 MIL/uL — ABNORMAL LOW (ref 3.87–5.11)
RDW: 18.9 % — ABNORMAL HIGH (ref 11.5–15.5)
WBC: 2.4 10*3/uL — ABNORMAL LOW (ref 4.0–10.5)
nRBC: 0 % (ref 0.0–0.2)

## 2023-01-07 MED ORDER — LIDOCAINE HCL 2 % IJ SOLN: 5.0000 mL | Freq: Once | INTRAMUSCULAR | Status: DC

## 2023-01-07 MED ORDER — LIDOCAINE HCL 1 % IJ SOLN: 5.0000 mL | Freq: Once | INTRAMUSCULAR | Status: DC

## 2023-01-07 MED ORDER — LIDOCAINE HCL 2 % IJ SOLN
20.0000 mL | Freq: Once | INTRAMUSCULAR | Status: AC
  Administered 2023-01-07: 400 mg via INTRADERMAL
  Filled 2023-01-07: qty 20

## 2023-01-07 NOTE — Telephone Encounter (Signed)
Patient is aware of upcoming appointment times/dates.  

## 2023-01-07 NOTE — Progress Notes (Signed)
Pt observed for 30 minutes post bone marrow biopsy & aspiration.  Pt tolerated procedure well w/out incident. VSS at discharge.  Ambulatory to lobby.

## 2023-01-07 NOTE — Progress Notes (Signed)
INDICATION: pancytopenia  Brief examination was performed. ENT: adequate airway clearance Heart: regular rate and rhythm.No Murmurs Lungs: clear to auscultation, no wheezes, normal respiratory effort  Bone Marrow Biopsy and Aspiration Procedure Note   Informed consent was obtained and potential risks including bleeding, infection and pain were reviewed with the patient.  The patient's name, date of birth, identification, consent and allergies were verified prior to the start of procedure and time out was performed.  The right posterior iliac crest was chosen as the site of biopsy.  The skin was prepped with ChloraPrep.   8 cc of 2% lidocaine was used to provide local anaesthesia.   10 cc of bone marrow aspirate was obtained followed by 1cm biopsy.  Pressure was applied to the biopsy site and bandage was placed over the biopsy site. Patient was made to lie on the back for 30 mins prior to discharge.  The procedure was tolerated well. COMPLICATIONS: None BLOOD LOSS: none The patient was discharged home in stable condition with a 1 week follow up to review results.  Patient was provided with post bone marrow biopsy instructions and instructed to call if there was any bleeding or worsening pain.  Specimens sent for flow cytometry, cytogenetics and additional studies.  Signed Scot Dock, NP

## 2023-01-07 NOTE — Patient Instructions (Signed)
Bone Marrow Aspiration and Bone Marrow Biopsy, Adult, Care After The following information offers guidance on how to care for yourself after your procedure. Your health care provider may also give you more specific instructions. If you have problems or questions, contact your health care provider. What can I expect after the procedure? After the procedure, it is common to have: Mild pain and tenderness. Swelling. Bruising. Follow these instructions at home: Incision care  Follow instructions from your health care provider about how to take care of the incision site. Make sure you: Wash your hands with soap and water for at least 20 seconds before and after you change your bandage (dressing). If soap and water are not available, use hand sanitizer. Change your dressing as told by your health care provider. Leave stitches (sutures), skin glue, or adhesive strips in place. These skin closures may need to stay in place for 2 weeks or longer. If adhesive strip edges start to loosen and curl up, you may trim the loose edges. Do not remove adhesive strips completely unless your health care provider tells you to do that. Check your incision site every day for signs of infection. Check for: More redness, swelling, or pain. Fluid or blood. Warmth. Pus or a bad smell. Activity Return to your normal activities as told by your health care provider. Ask your health care provider what activities are safe for you. Do not lift anything that is heavier than 10 lb (4.5 kg), or the limit that you are told, until your health care provider says that it is safe. If you were given a sedative during the procedure, it can affect you for several hours. Do not drive or operate machinery until your health care provider says that it is safe. General instructions  Take over-the-counter and prescription medicines only as told by your health care provider. Do not take baths, swim, or use a hot tub until your health care  provider approves. Ask your health care provider if you may take showers. You may only be allowed to take sponge baths. If directed, put ice on the affected area. To do this: Put ice in a plastic bag. Place a towel between your skin and the bag. Leave the ice on for 20 minutes, 2-3 times a day. If your skin turns bright red, remove the ice right away to prevent skin damage. The risk of skin damage is higher if you cannot feel pain, heat, or cold. Contact a health care provider if: You have signs of infection. Your pain is not controlled with medicine. You have cancer, and a temperature of 100.4F (38C) or higher. Get help right away if: You have a temperature of 101F (38.3C) or higher, or as told by your health care provider. You have bleeding from the incision site that cannot be controlled. This information is not intended to replace advice given to you by your health care provider. Make sure you discuss any questions you have with your health care provider. Document Revised: 10/09/2021 Document Reviewed: 10/09/2021 Elsevier Patient Education  2024 Elsevier Inc.  

## 2023-01-10 LAB — SURGICAL PATHOLOGY

## 2023-01-14 LAB — SURGICAL PATHOLOGY

## 2023-01-15 ENCOUNTER — Encounter (HOSPITAL_COMMUNITY): Payer: Self-pay | Admitting: Hematology and Oncology

## 2023-01-17 DIAGNOSIS — D469 Myelodysplastic syndrome, unspecified: Secondary | ICD-10-CM

## 2023-01-17 HISTORY — DX: Myelodysplastic syndrome, unspecified: D46.9

## 2023-01-28 ENCOUNTER — Other Ambulatory Visit: Payer: Self-pay

## 2023-01-28 ENCOUNTER — Inpatient Hospital Stay: Payer: Medicare Other | Attending: Hematology and Oncology | Admitting: Adult Health

## 2023-01-28 VITALS — BP 154/78 | HR 75 | Temp 98.4°F | Resp 16 | Wt 191.3 lb

## 2023-01-28 DIAGNOSIS — D46Z Other myelodysplastic syndromes: Secondary | ICD-10-CM

## 2023-01-28 DIAGNOSIS — Z79899 Other long term (current) drug therapy: Secondary | ICD-10-CM | POA: Diagnosis not present

## 2023-01-28 DIAGNOSIS — R21 Rash and other nonspecific skin eruption: Secondary | ICD-10-CM | POA: Diagnosis not present

## 2023-01-28 DIAGNOSIS — Z8261 Family history of arthritis: Secondary | ICD-10-CM | POA: Insufficient documentation

## 2023-01-28 DIAGNOSIS — Z9089 Acquired absence of other organs: Secondary | ICD-10-CM | POA: Diagnosis not present

## 2023-01-28 DIAGNOSIS — D469 Myelodysplastic syndrome, unspecified: Secondary | ICD-10-CM | POA: Insufficient documentation

## 2023-01-28 DIAGNOSIS — D696 Thrombocytopenia, unspecified: Secondary | ICD-10-CM | POA: Diagnosis present

## 2023-01-28 DIAGNOSIS — Z87891 Personal history of nicotine dependence: Secondary | ICD-10-CM | POA: Insufficient documentation

## 2023-01-28 DIAGNOSIS — J4540 Moderate persistent asthma, uncomplicated: Secondary | ICD-10-CM | POA: Diagnosis not present

## 2023-01-28 DIAGNOSIS — Z840 Family history of diseases of the skin and subcutaneous tissue: Secondary | ICD-10-CM | POA: Diagnosis not present

## 2023-01-28 DIAGNOSIS — Z818 Family history of other mental and behavioral disorders: Secondary | ICD-10-CM | POA: Diagnosis not present

## 2023-01-28 DIAGNOSIS — R5383 Other fatigue: Secondary | ICD-10-CM | POA: Insufficient documentation

## 2023-01-28 NOTE — Progress Notes (Unsigned)
[facility] Cancer Follow up:    [name], [name], MD [address] [place] [address]   DIAGNOSIS: thrombocytopenia  SUMMARY OF ONCOLOGIC HISTORY: Evaluated by Dr. Al Pimple on 11/24/2022, blood counts showed persistent thrombocytopenia, macrocytosis, normal b12 level, and normal iron studies.   Bone marrow biopsy completed on 7/22/2024cellular  bone marrow with focal areas of hypercellularity (approximately up to  50%).  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  Blasts appear  mildly mildly increased, approximately 5% to focally up to 10%.  Flow  cytometric analysis reveals 5% CD34 positive blasts.  While the findings could be attributable to the patient's coexisting conditions, the possibility of a myeloid neoplasm such as myelodysplastic neoplasm with increased blasts cannot be entirely excluded. , normal karyotype  CURRENT THERAPY:  INTERVAL HISTORY: [name] [name] 77 y.o. female returns for    Patient Active Problem List   Diagnosis Date Noted   Obesity 03/02/2021   Other long term (current) drug therapy 03/02/2021   Primary osteoarthritis 03/02/2021   SOB (shortness of breath) 12/08/2020   Pericardial effusion 12/08/2020   Dry eyes/dry mouth 05/25/2019   Bronchitis, mucopurulent recurrent (HCC) 02/26/2019   Moderate persistent asthma without complication 02/24/2019   Perennial and seasonal allergic rhinitis 02/24/2019   Seasonal and perennial allergic rhinoconjunctivitis 02/24/2019   Primary osteoarthritis of both knees 04/10/2016   Acute chest pain 08/19/2014   Costochondritis 08/19/2014   Lung nodule 08/19/2014   HTN (hypertension) 08/07/2012   HLD (hyperlipidemia) 09/26/2011   Asthma 09/06/2010   Depression 09/06/2010   History of allergy 09/06/2010   Hypothyroidism 09/06/2010   Lower back pain 09/06/2010   Arthritis 09/06/2010   Pain in joint, pelvic region and thigh 09/06/2010   Peripheral neuropathy 09/06/2010   Psoriasis  09/06/2010   Therapeutic drug monitoring 09/06/2010   Tinnitus 09/06/2010   Vitamin B 12 deficiency 09/06/2010   Cervical spondylosis without myelopathy 04/24/2006   Tear of lateral cartilage or meniscus of knee, current 03/13/2006    is allergic to latex, cat hair extract, dust mite extract, molds & smuts, other, and pollen extract-tree extract [pollen extract].  MEDICAL HISTORY: Past Medical History:  Diagnosis Date   Asthma    Diverticulitis    Hypothyroid    Psoriatic arthritis (HCC)     SURGICAL HISTORY: Past Surgical History:  Procedure Laterality Date   COLONOSCOPY  08/2021   HIP SURGERY Bilateral    TONSILLECTOMY     TUBAL LIGATION      SOCIAL HISTORY: Social History   Socioeconomic History   Marital status: Widowed    Spouse name: Not on file   Number of children: Not on file   Years of education: Not on file   Highest education level: Not on file  Occupational History   Not on file  Tobacco Use   Smoking status: Former    Current packs/day: 0.00    Average packs/day: 0.1 packs/day for 33.4 years (3.3 ttl pk-yrs)    Types: Cigarettes    Start date: 38    Quit date: 11/18/2000    Years since quitting: 22.2   Smokeless tobacco: Never   Tobacco comments:    3 cigarettes a day  Vaping Use   Vaping status: Never Used  Substance and Sexual Activity   Alcohol use: Never   Drug use: Never   Sexual activity: Not Currently  Other Topics Concern   Not on file  Social History Narrative   Lives with children  Social Determinants of Health   Financial Resource Strain: Not on file  Food Insecurity: No Food Insecurity (11/24/2022)   Hunger Vital Sign    Worried About Running Out of Food in the Last Year: Never true    Ran Out of Food in the Last Year: Never true  Transportation Needs: No Transportation Needs (11/24/2022)   PRAPARE - Administrator, Civil Service (Medical): No    Lack of Transportation (Non-Medical): No  Physical Activity: Not  on file  Stress: Not on file  Social Connections: Not on file  Intimate Partner Violence: Not At Risk (11/24/2022)   Humiliation, Afraid, Rape, and Kick questionnaire    Fear of Current or Ex-Partner: No    Emotionally Abused: No    Physically Abused: No    Sexually Abused: No    FAMILY HISTORY: Family History  Problem Relation Age of Onset   Asthma Mother    Allergic rhinitis Mother    Rheum arthritis Mother    Allergic rhinitis Father    Alzheimer's disease Father    Eczema Brother    Allergic rhinitis Brother    Colon cancer Neg Hx    Esophageal cancer Neg Hx    Stomach cancer Neg Hx    Rectal cancer Neg Hx     Review of Systems - Oncology    PHYSICAL EXAMINATION    There were no vitals filed for this visit.  Physical Exam  LABORATORY DATA:  CBC    Component Value Date/Time   WBC 2.4 (L) 01/07/2023 0907   RBC 3.52 (L) 01/07/2023 0907   HGB 11.1 (L) 01/07/2023 0907   HCT 34.6 (L) 01/07/2023 0907   HCT 39.0 11/24/2022 1035   PLT 61 (L) 01/07/2023 0907   MCV 98.3 01/07/2023 0907   MCH 31.5 01/07/2023 0907   MCHC 32.1 01/07/2023 0907   RDW 18.9 (H) 01/07/2023 0907   LYMPHSABS 1.2 01/07/2023 0907   MONOABS 0.5 01/07/2023 0907   EOSABS 0.0 01/07/2023 0907   BASOSABS 0.0 01/07/2023 0907    CMP     Component Value Date/Time   NA 139 11/03/2022 2326   K 3.7 11/03/2022 2326   CL 108 11/03/2022 2326   CO2 23 11/03/2022 2326   GLUCOSE 97 11/03/2022 2326   BUN 12 11/03/2022 2326   CREATININE 0.51 11/03/2022 2326   CALCIUM 8.8 (L) 11/03/2022 2326   GFRNONAA >60 11/03/2022 2326       PENDING LABS:   RADIOGRAPHIC STUDIES:  No results found.   PATHOLOGY:     ASSESSMENT and THERAPY PLAN:   No problem-specific Assessment & Plan notes found for this encounter.   No orders of the defined types were placed in this encounter.   All questions were answered. The patient knows to call the clinic with any problems, questions or concerns. We can  certainly see the patient much sooner if necessary. This note was electronically signed. Noreene Filbert, NP 01/28/2023

## 2023-01-28 NOTE — Patient Instructions (Signed)
Myelodysplastic Syndrome Myelodysplastic syndrome (MDS) is a group of cancers that affect the blood cells. MDS starts in the bone marrow. The bone marrow is the spongy tissue inside of bones where new blood cells are made. New blood cells are also called immature cells or stem cells. The bone marrow makes: Red blood cells. These carry oxygen. White blood cells. These fight infection. Platelets. These stop bleeding. With MDS, some immature cells do not grow into adult blood cells. These cells will die before they reach maturity. Immature blood cells build up in the bone marrow and crowd out normal adult blood cells. This causes a low blood count and can affect how many healthy red blood cells, white blood cells, and platelets you have. What are the causes? This condition may be caused by: Being exposed to certain medicines or chemicals. Radiation treatment. In many cases, the cause may not be known. What increases the risk? The following factors may make you more likely to develop this condition: Being female. Being age 25 or older. Being Caucasian. Having been treated with cancer medicines or radiation. Having been exposed to tobacco smoke, pesticides, lead, mercury, or a chemical called benzene. Having a family history of MDS. Having inherited certain abnormal genes that lead to bone marrow problems. What are the signs or symptoms? Symptoms of this condition often develop slowly over time. Symptoms depend on which type of blood cells are affected. Symptoms of too few red blood cells include: Being tired all the time. Feeling out of breath. Having pale skin. Having an irregular heart rate. Symptoms of too few white blood cells include: Getting infections often. Having pain in the bones. Feeling weak. Having a fever often. Symptoms of having too few platelets include: Bruising easily. Bleeding that lasts longer than normal. Having tiny spots of bleeding under the skin  (petechiae). How is this diagnosed?     This condition is diagnosed based on your symptoms, your medical history, and a physical exam. You may also have tests, including: A complete blood count. This test counts the number of red blood cells, white blood cells, and platelets. Peripheral blood smear. This test checks the number and type of blood cells. It also checks their size and shape. Bone marrow aspiration and biopsy. Aspiration involves using a needle to remove the liquid portion of the bone marrow, and a biopsy involves removing the bone marrow tissue. The samples removed will be examined under a microscope. How is this treated? There is no cure for this condition, but treatment can relieve your symptoms and slow down the production of immature blood cells. The type of treatment that is best for you depends on the type of MDS that you have. Treatment may include: Blood transfusions. Chemotherapy. This is the use of medicines to kill cancer cells. Targeted therapy. This treatment targets specific parts of cancer cells and the area around them to block the growth and spread of the cancer. Immunotherapy or biologic therapy. This treatment helps your body boost its own disease-fighting system (immune system) and natural defenses to stop the growth and spread of cancer cells. Medicines that: Slow down the number of immature blood cells that are made. Help immature cells develop into adult cells. Make more blood cells. Treat infections (antibiotics). Bone marrow stem cell transplant. In this procedure, your stem cells will be replaced with healthy stem cells from a donor. Follow these instructions at home: Medicines Take over-the-counter and prescription medicines only as told by your health care provider. Do not  take aspirin unless your health care provider tells you to. Aspirin can make bleeding more likely. If you were prescribed antibiotics, take them as told by your health care  provider. Do not stop taking the antibiotic even if you start to feel better. Lifestyle Avoid dangerous activities that could lead to bleeding. Ask your health care provider what activities are safe for you. Wash all fruits and vegetables before eating them. Make sure meat and eggs are well cooked. Do not eat raw fish and shellfish. Wash your hands often with soap and water for at least 20 seconds. If soap and water are not available, use hand sanitizer. Do not use any products that contain nicotine or tobacco. These products include cigarettes, chewing tobacco, and vaping devices, such as e-cigarettes. If you need help quitting, ask your health care provider. General instructions Stay away from people who are sick. Talk to your health care provider before having any medical or dental procedures. Keep all follow-up visits. Your health care provider may do regular exams and lab tests to monitor your condition. This is to make sure that the treatments are working. Where to find more information American Cancer Society: www.cancer.org The MDS Foundation: www.mds-foundation.org National Cancer Institute: www.cancer.gov The Aplastic Anemia and MDS International Foundation: www.aamds.org Contact a health care provider if: You feel more tired than normal. You have more bruising than usual. You have trouble catching your breath. You have any symptoms of infection, such as: A fever or chills. Abnormal urinary symptoms. Cough. Body aches or a headache. Weakness or dizziness. Get help right away if: Your symptoms get worse. You have bleeding that does not stop. You have trouble breathing. You have chest pain. Summary Myelodysplastic syndrome (MDS) is a group of cancers that affect the blood cells. Symptoms of this condition often develop slowly over time. Symptoms depend on which blood cells are affected. There is no cure for this condition, but treatment can relieve your symptoms and slow down  the production of immature blood cells. The type of treatment that is best for you depends on the type of MDS that you have. Keep all follow-up visits. Your health care provider may do regular exams and lab tests to monitor your condition. This is to make sure that the treatments are working. This information is not intended to replace advice given to you by your health care provider. Make sure you discuss any questions you have with your health care provider. Document Revised: 08/29/2021 Document Reviewed: 08/29/2021 Elsevier Patient Education  2024 Elsevier Inc.  Azacitidine Injection What is this medication? AZACITIDINE (ay za SITE i deen) treats blood and bone marrow cancers. It works by slowing down the growth of cancer cells. This medicine may be used for other purposes; ask your health care provider or pharmacist if you have questions. COMMON BRAND NAME(S): Vidaza What should I tell my care team before I take this medication? They need to know if you have any of these conditions: Kidney disease Liver disease Low blood cell levels, such as low white cells, platelets, or red blood cells Low levels of albumin in the blood Low levels of bicarbonate in the blood An unusual or allergic reaction to azacitidine, mannitol, other medications, foods, dyes, or preservatives If you or your partner are pregnant or trying to get pregnant Breast-feeding How should I use this medication? This medication is injected into a vein or under the skin. It is given by your care team in a hospital or clinic setting. Talk to your  care team about the use of this medication in children. While it may be prescribed for children as young as 1 month for selected conditions, precautions do apply. Overdosage: If you think you have taken too much of this medicine contact a poison control center or emergency room at once. NOTE: This medicine is only for you. Do not share this medicine with others. What if I miss a  dose? Keep appointments for follow-up doses. It is important not to miss your dose. Call your care team if you are unable to keep an appointment. What may interact with this medication? Interactions are not expected. This list may not describe all possible interactions. Give your health care provider a list of all the medicines, herbs, non-prescription drugs, or dietary supplements you use. Also tell them if you smoke, drink alcohol, or use illegal drugs. Some items may interact with your medicine. What should I watch for while using this medication? Your condition will be monitored carefully while you are receiving this medication. You may need blood work while taking this medication. This medication may make you feel generally unwell. This is not uncommon as chemotherapy can affect healthy cells as well as cancer cells. Report any side effects. Continue your course of treatment even though you feel ill unless your care team tells you to stop. Other product types may be available that contain the medication azacitidine. The injection and oral products should not be used in place of one another. Talk to your care team if you have questions. This medication can cause serious side effects. To reduce the risk, your care team may give you other medications to take before receiving this one. Be sure to follow the directions from your care team. This medication may increase your risk of getting an infection. Call your care team for advice if you get a fever, chills, sore throat, or other symptoms of a cold or flu. Do not treat yourself. Try to avoid being around people who are sick. Avoid taking medications that contain aspirin, acetaminophen, ibuprofen, naproxen, or ketoprofen unless instructed by your care team. These medications may hide a fever. This medication may increase your risk to bruise or bleed. Call your care team if you notice any unusual bleeding. Be careful brushing or flossing your teeth or  using a toothpick because you may get an infection or bleed more easily. If you have any dental work done, tell your dentist you are receiving this medication. Talk to your care team if you or your partner may be pregnant. Serious birth defects can occur if you take this medication during pregnancy and for 6 months after the last dose. You will need a negative pregnancy test before starting this medication. Contraception is recommended while taking his medication and for 6 months after the last dose. Your care team can help you find the option that works for you. If your partner can get pregnant, use a condom during sex while taking this medication and for 3 months after the last dose. Do not breastfeed while taking this medication and for 1 week after the last dose. This medication may cause infertility. Talk to your care team if you are concerned about your fertility. What side effects may I notice from receiving this medication? Side effects that you should report to your care team as soon as possible: Allergic reactions--skin rash, itching, hives, swelling of the face, lips, tongue, or throat Infection--fever, chills, cough, sore throat, wounds that don't heal, pain or trouble when passing urine,  general feeling of discomfort or being unwell Kidney injury--decrease in the amount of urine, swelling of the ankles, hands, or feet Liver injury--right upper belly pain, loss of appetite, nausea, light-colored stool, dark yellow or brown urine, yellowing skin or eyes, unusual weakness or fatigue Low red blood cell level--unusual weakness or fatigue, dizziness, headache, trouble breathing Tumor lysis syndrome (TLS)--nausea, vomiting, diarrhea, decrease in the amount of urine, dark urine, unusual weakness or fatigue, confusion, muscle pain or cramps, fast or irregular heartbeat, joint pain Unusual bruising or bleeding Side effects that usually do not require medical attention (report to your care team if  they continue or are bothersome): Constipation Diarrhea Nausea Pain, redness, or irritation at injection site Vomiting This list may not describe all possible side effects. Call your doctor for medical advice about side effects. You may report side effects to FDA at 1-800-FDA-1088. Where should I keep my medication? This medication is given in a hospital or clinic. It will not be stored at home. NOTE: This sheet is a summary. It may not cover all possible information. If you have questions about this medicine, talk to your doctor, pharmacist, or health care provider.  2024 Elsevier/Gold Standard (2021-10-19 00:00:00)

## 2023-01-29 ENCOUNTER — Encounter: Payer: Self-pay | Admitting: Hematology and Oncology

## 2023-01-29 ENCOUNTER — Telehealth: Payer: Self-pay | Admitting: Hematology and Oncology

## 2023-01-29 DIAGNOSIS — D46Z Other myelodysplastic syndromes: Secondary | ICD-10-CM | POA: Insufficient documentation

## 2023-01-29 MED ORDER — LIDOCAINE-PRILOCAINE 2.5-2.5 % EX CREA
TOPICAL_CREAM | CUTANEOUS | 3 refills | Status: DC
Start: 2023-01-29 — End: 2023-02-12

## 2023-01-29 MED ORDER — ONDANSETRON HCL 8 MG PO TABS
8.0000 mg | ORAL_TABLET | Freq: Three times a day (TID) | ORAL | 1 refills | Status: DC | PRN
Start: 2023-01-29 — End: 2023-02-12

## 2023-01-29 NOTE — Assessment & Plan Note (Signed)
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  If left untreated MDS could progress to acute leukemia and also worsen the cytopenias.  Recommendation: FISH panel for MDS Treatment with Vidaza  I discussed the risks and benefits of 5 days and patient is willing to proceed.  We will try to initiate the treatment starting next Monday.  Follow-up with Dr. Al Pimple

## 2023-01-29 NOTE — Telephone Encounter (Signed)
Scheduled appointments per WQ. Patient is aware of the made appointments.  

## 2023-01-29 NOTE — Progress Notes (Signed)
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 

## 2023-01-30 ENCOUNTER — Telehealth: Payer: Self-pay | Admitting: Hematology and Oncology

## 2023-01-30 NOTE — Telephone Encounter (Signed)
Cancelled appointment per 8/14 secure chat and staff message. Patient is aware of the changes made to her upcoming appointment.

## 2023-01-31 ENCOUNTER — Ambulatory Visit: Payer: Medicare Other | Admitting: Pharmacist

## 2023-01-31 ENCOUNTER — Other Ambulatory Visit: Payer: Self-pay

## 2023-02-01 ENCOUNTER — Encounter: Payer: Self-pay | Admitting: Hematology and Oncology

## 2023-02-01 ENCOUNTER — Other Ambulatory Visit: Payer: Self-pay

## 2023-02-01 ENCOUNTER — Inpatient Hospital Stay: Payer: Medicare Other

## 2023-02-01 DIAGNOSIS — D46Z Other myelodysplastic syndromes: Secondary | ICD-10-CM

## 2023-02-01 NOTE — Progress Notes (Signed)
Called pt to introduce myself as her Dance movement psychotherapist and to discuss the [employer]. I went over what the grant covers but she declined it at this time so I will request the registration staff give her my card in case she changes her mind and for any questions or concerns she may have in the future.

## 2023-02-04 ENCOUNTER — Inpatient Hospital Stay: Payer: Medicare Other

## 2023-02-04 ENCOUNTER — Inpatient Hospital Stay (HOSPITAL_BASED_OUTPATIENT_CLINIC_OR_DEPARTMENT_OTHER): Payer: Medicare Other | Admitting: Hematology and Oncology

## 2023-02-04 VITALS — BP 129/67 | HR 80 | Resp 18

## 2023-02-04 DIAGNOSIS — D46Z Other myelodysplastic syndromes: Secondary | ICD-10-CM

## 2023-02-04 DIAGNOSIS — D696 Thrombocytopenia, unspecified: Secondary | ICD-10-CM | POA: Diagnosis not present

## 2023-02-04 LAB — BASIC METABOLIC PANEL - CANCER CENTER ONLY
Anion gap: 9 (ref 5–15)
BUN: 14 mg/dL (ref 8–23)
CO2: 24 mmol/L (ref 22–32)
Calcium: 8.9 mg/dL (ref 8.9–10.3)
Chloride: 103 mmol/L (ref 98–111)
Creatinine: 0.43 mg/dL — ABNORMAL LOW (ref 0.44–1.00)
GFR, Estimated: >60 mL/min (ref >60)
Glucose, Bld: 95 mg/dL (ref 70–99)
Potassium: 3.7 mmol/L (ref 3.5–5.1)
Sodium: 136 mmol/L (ref 135–145)

## 2023-02-04 LAB — CBC WITH DIFFERENTIAL (CANCER CENTER ONLY)
Abs Immature Granulocytes: 0.03 10*3/uL (ref 0.00–0.07)
Basophils Absolute: 0 10*3/uL (ref 0.0–0.1)
Basophils Relative: 0 %
Eosinophils Absolute: 0 10*3/uL (ref 0.0–0.5)
Eosinophils Relative: 0 %
HCT: 35.1 % — ABNORMAL LOW (ref 36.0–46.0)
Hemoglobin: 11.8 g/dL — ABNORMAL LOW (ref 12.0–15.0)
Immature Granulocytes: 1 %
Lymphocytes Relative: 36 %
Lymphs Abs: 0.9 10*3/uL (ref 0.7–4.0)
MCH: 32.2 pg (ref 26.0–34.0)
MCHC: 33.6 g/dL (ref 30.0–36.0)
MCV: 95.6 fL (ref 80.0–100.0)
Monocytes Absolute: 0.5 10*3/uL (ref 0.1–1.0)
Monocytes Relative: 21 %
Neutro Abs: 1.1 10*3/uL — ABNORMAL LOW (ref 1.7–7.7)
Neutrophils Relative %: 42 %
Platelet Count: 47 10*3/uL — ABNORMAL LOW (ref 150–400)
RBC: 3.67 MIL/uL — ABNORMAL LOW (ref 3.87–5.11)
RDW: 19.2 % — ABNORMAL HIGH (ref 11.5–15.5)
WBC Count: 2.5 10*3/uL — ABNORMAL LOW (ref 4.0–10.5)
nRBC: 0 % (ref 0.0–0.2)

## 2023-02-04 MED ORDER — SODIUM CHLORIDE 0.9 % IV SOLN
75.0000 mg/m2 | Freq: Once | INTRAVENOUS | Status: AC
  Administered 2023-02-04: 153 mg via INTRAVENOUS
  Filled 2023-02-04: qty 15.3

## 2023-02-04 MED ORDER — SODIUM CHLORIDE 0.9 % IV SOLN: Freq: Once | INTRAVENOUS | Status: AC

## 2023-02-04 MED ORDER — ONDANSETRON HCL 8 MG PO TABS
8.0000 mg | ORAL_TABLET | Freq: Once | ORAL | Status: AC
  Administered 2023-02-04: 8 mg via ORAL
  Filled 2023-02-04: qty 1

## 2023-02-04 NOTE — Progress Notes (Signed)
Per Iruku, MD okay to treat with plt 47 and neutrophils 1.1

## 2023-02-04 NOTE — Patient Instructions (Signed)
[facility]  Discharge Instructions: Thank you for choosing [facility] to provide your oncology and hematology care.   If you have a lab appointment with the Cancer Center, please go directly to the Cancer Center and check in at the registration area.   Wear comfortable clothing and clothing appropriate for easy access to any Portacath or PICC line.   We strive to give you quality time with your provider. You may need to reschedule your appointment if you arrive late (15 or more minutes).  Arriving late affects you and other patients whose appointments are after yours.  Also, if you miss three or more appointments without notifying the office, you may be dismissed from the clinic at the provider's discretion.      For prescription refill requests, have your pharmacy contact our office and allow 72 hours for refills to be completed.    Today you received the following chemotherapy and/or immunotherapy agents: Vidaza.       To help prevent nausea and vomiting after your treatment, we encourage you to take your nausea medication as directed.  BELOW ARE SYMPTOMS THAT SHOULD BE REPORTED IMMEDIATELY: *FEVER GREATER THAN 100.4 F (38 C) OR HIGHER *CHILLS OR SWEATING *NAUSEA AND VOMITING THAT IS NOT CONTROLLED WITH YOUR NAUSEA MEDICATION *UNUSUAL SHORTNESS OF BREATH *UNUSUAL BRUISING OR BLEEDING *URINARY PROBLEMS (pain or burning when urinating, or frequent urination) *BOWEL PROBLEMS (unusual diarrhea, constipation, pain near the anus) TENDERNESS IN MOUTH AND THROAT WITH OR WITHOUT PRESENCE OF ULCERS (sore throat, sores in mouth, or a toothache) UNUSUAL RASH, SWELLING OR PAIN  UNUSUAL VAGINAL DISCHARGE OR ITCHING   Items with * indicate a potential emergency and should be followed up as soon as possible or go to the Emergency Department if any problems should occur.  Please show the CHEMOTHERAPY ALERT CARD or IMMUNOTHERAPY ALERT CARD at  check-in to the Emergency Department and triage nurse.  Should you have questions after your visit or need to cancel or reschedule your appointment, please contact [facility]  Dept: [phone]  and follow the prompts.  Office hours are 8:00 a.m. to 4:30 p.m. Monday - Friday. Please note that voicemails left after 4:00 p.m. may not be returned until the following business day.  We are closed weekends and major holidays. You have access to a nurse at all times for urgent questions. Please call the main number to the clinic Dept: [phone] and follow the prompts.   For any non-urgent questions, you may also contact your provider using MyChart. We now offer e-Visits for anyone 100 and older to request care online for non-urgent symptoms. For details visit [email or web address].   Also download the MyChart app! Go to the app store, search "MyChart", open the app, select [facility], and log in with your MyChart username and password.  Azacitidine Injection What is this medication? AZACITIDINE (ay za SITE i deen) treats blood and bone marrow cancers. It works by slowing down the growth of cancer cells. This medicine may be used for other purposes; ask your health care provider or pharmacist if you have questions. COMMON BRAND NAME(S): Vidaza What should I tell my care team before I take this medication? They need to know if you have any of these conditions: Kidney disease Liver disease Low blood cell levels, such as low white cells, platelets, or red blood cells Low levels of albumin in the blood Low levels of bicarbonate in the  blood An unusual or allergic reaction to azacitidine, mannitol, other medications, foods, dyes, or preservatives If you or your partner are pregnant or trying to get pregnant Breast-feeding How should I use this medication? This medication is injected into a vein or under the skin. It is given by your care team in a hospital or  clinic setting. Talk to your care team about the use of this medication in children. While it may be prescribed for children as young as 1 month for selected conditions, precautions do apply. Overdosage: If you think you have taken too much of this medicine contact a poison control center or emergency room at once. NOTE: This medicine is only for you. Do not share this medicine with others. What if I miss a dose? Keep appointments for follow-up doses. It is important not to miss your dose. Call your care team if you are unable to keep an appointment. What may interact with this medication? Interactions are not expected. This list may not describe all possible interactions. Give your health care provider a list of all the medicines, herbs, non-prescription drugs, or dietary supplements you use. Also tell them if you smoke, drink alcohol, or use illegal drugs. Some items may interact with your medicine. What should I watch for while using this medication? Your condition will be monitored carefully while you are receiving this medication. You may need blood work while taking this medication. This medication may make you feel generally unwell. This is not uncommon as chemotherapy can affect healthy cells as well as cancer cells. Report any side effects. Continue your course of treatment even though you feel ill unless your care team tells you to stop. Other product types may be available that contain the medication azacitidine. The injection and oral products should not be used in place of one another. Talk to your care team if you have questions. This medication can cause serious side effects. To reduce the risk, your care team may give you other medications to take before receiving this one. Be sure to follow the directions from your care team. This medication may increase your risk of getting an infection. Call your care team for advice if you get a fever, chills, sore throat, or other symptoms of a  cold or flu. Do not treat yourself. Try to avoid being around people who are sick. Avoid taking medications that contain aspirin, acetaminophen, ibuprofen, naproxen, or ketoprofen unless instructed by your care team. These medications may hide a fever. This medication may increase your risk to bruise or bleed. Call your care team if you notice any unusual bleeding. Be careful brushing or flossing your teeth or using a toothpick because you may get an infection or bleed more easily. If you have any dental work done, tell your dentist you are receiving this medication. Talk to your care team if you or your partner may be pregnant. Serious birth defects can occur if you take this medication during pregnancy and for 6 months after the last dose. You will need a negative pregnancy test before starting this medication. Contraception is recommended while taking his medication and for 6 months after the last dose. Your care team can help you find the option that works for you. If your partner can get pregnant, use a condom during sex while taking this medication and for 3 months after the last dose. Do not breastfeed while taking this medication and for 1 week after the last dose. This medication may cause infertility. Talk to your  care team if you are concerned about your fertility. What side effects may I notice from receiving this medication? Side effects that you should report to your care team as soon as possible: Allergic reactions--skin rash, itching, hives, swelling of the face, lips, tongue, or throat Infection--fever, chills, cough, sore throat, wounds that don't heal, pain or trouble when passing urine, general feeling of discomfort or being unwell Kidney injury--decrease in the amount of urine, swelling of the ankles, hands, or feet Liver injury--right upper belly pain, loss of appetite, nausea, light-colored stool, dark yellow or brown urine, yellowing skin or eyes, unusual weakness or fatigue Low  red blood cell level--unusual weakness or fatigue, dizziness, headache, trouble breathing Tumor lysis syndrome (TLS)--nausea, vomiting, diarrhea, decrease in the amount of urine, dark urine, unusual weakness or fatigue, confusion, muscle pain or cramps, fast or irregular heartbeat, joint pain Unusual bruising or bleeding Side effects that usually do not require medical attention (report to your care team if they continue or are bothersome): Constipation Diarrhea Nausea Pain, redness, or irritation at injection site Vomiting This list may not describe all possible side effects. Call your doctor for medical advice about side effects. You may report side effects to FDA at 1-800-FDA-1088. Where should I keep my medication? This medication is given in a hospital or clinic. It will not be stored at home. NOTE: This sheet is a summary. It may not cover all possible information. If you have questions about this medicine, talk to your doctor, pharmacist, or health care provider.  2024 Elsevier/Gold Standard (2021-10-19 00:00:00)

## 2023-02-04 NOTE — Progress Notes (Unsigned)
[facility] Cancer Follow up:    [name], [name], MD [address] [place] [address]   DIAGNOSIS: thrombocytopenia  SUMMARY OF ONCOLOGIC HISTORY: Evaluated by Dr. Al Pimple on 11/24/2022, blood counts showed persistent thrombocytopenia, macrocytosis, normal b12 level, and normal iron studies.   Bone marrow biopsy completed on 7/22/2024cellular  bone marrow with focal areas of hypercellularity (approximately up to  50%).  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  Blasts appear  mildly mildly increased, approximately 5% to focally up to 10%.  Flow  cytometric analysis reveals 5% CD34 positive blasts.  While the findings could be attributable to the patient's coexisting conditions, the possibility of a myeloid neoplasm such as myelodysplastic neoplasm with increased blasts cannot be entirely excluded. , normal karyotype  CURRENT THERAPY:  INTERVAL HISTORY: [name] RELIFORD 77 y.o. female returns for follow up. Since last visit, she was seen by Dr Pamelia Hoit and his recommendation was to consider azacytidine.   Patient Active Problem List   Diagnosis Date Noted   MDS (myelodysplastic syndrome), high grade (HCC) 01/29/2023   Obesity 03/02/2021   Other long term (current) drug therapy 03/02/2021   Primary osteoarthritis 03/02/2021   SOB (shortness of breath) 12/08/2020   Pericardial effusion 12/08/2020   Dry eyes/dry mouth 05/25/2019   Bronchitis, mucopurulent recurrent (HCC) 02/26/2019   Moderate persistent asthma without complication 02/24/2019   Perennial and seasonal allergic rhinitis 02/24/2019   Seasonal and perennial allergic rhinoconjunctivitis 02/24/2019   Primary osteoarthritis of both knees 04/10/2016   Acute chest pain 08/19/2014   Costochondritis 08/19/2014   Lung nodule 08/19/2014   HTN (hypertension) 08/07/2012   HLD (hyperlipidemia) 09/26/2011   Asthma 09/06/2010   Depression 09/06/2010   History of allergy 09/06/2010   Hypothyroidism  09/06/2010   Lower back pain 09/06/2010   Arthritis 09/06/2010   Pain in joint, pelvic region and thigh 09/06/2010   Peripheral neuropathy 09/06/2010   Psoriasis 09/06/2010   Therapeutic drug monitoring 09/06/2010   Tinnitus 09/06/2010   Vitamin B 12 deficiency 09/06/2010   Cervical spondylosis without myelopathy 04/24/2006   Tear of lateral cartilage or meniscus of knee, current 03/13/2006    is allergic to latex, cat hair extract, dust mite extract, molds & smuts, other, and pollen extract-tree extract [pollen extract].  MEDICAL HISTORY: Past Medical History:  Diagnosis Date   Asthma    Diverticulitis    Hypothyroid    Psoriatic arthritis (HCC)     SURGICAL HISTORY: Past Surgical History:  Procedure Laterality Date   COLONOSCOPY  08/2021   HIP SURGERY Bilateral    TONSILLECTOMY     TUBAL LIGATION      SOCIAL HISTORY: Social History   Socioeconomic History   Marital status: Widowed    Spouse name: Not on file   Number of children: Not on file   Years of education: Not on file   Highest education level: Not on file  Occupational History   Not on file  Tobacco Use   Smoking status: Former    Current packs/day: 0.00    Average packs/day: 0.1 packs/day for 33.4 years (3.3 ttl pk-yrs)    Types: Cigarettes    Start date: 37    Quit date: 11/18/2000    Years since quitting: 22.2   Smokeless tobacco: Never   Tobacco comments:    3 cigarettes a day  Vaping Use   Vaping status: Never Used  Substance and Sexual Activity   Alcohol use: Never   Drug use:  Never   Sexual activity: Not Currently  Other Topics Concern   Not on file  Social History Narrative   Lives with children   Social Determinants of Health   Financial Resource Strain: Not on file  Food Insecurity: No Food Insecurity (11/24/2022)   Hunger Vital Sign    Worried About Running Out of Food in the Last Year: Never true    Ran Out of Food in the Last Year: Never true  Transportation Needs: No  Transportation Needs (11/24/2022)   PRAPARE - Administrator, Civil Service (Medical): No    Lack of Transportation (Non-Medical): No  Physical Activity: Not on file  Stress: Not on file  Social Connections: Not on file  Intimate Partner Violence: Not At Risk (11/24/2022)   Humiliation, Afraid, Rape, and Kick questionnaire    Fear of Current or Ex-Partner: No    Emotionally Abused: No    Physically Abused: No    Sexually Abused: No    FAMILY HISTORY: Family History  Problem Relation Age of Onset   Asthma Mother    Allergic rhinitis Mother    Rheum arthritis Mother    Allergic rhinitis Father    Alzheimer's disease Father    Eczema Brother    Allergic rhinitis Brother    Colon cancer Neg Hx    Esophageal cancer Neg Hx    Stomach cancer Neg Hx    Rectal cancer Neg Hx     Review of Systems - Oncology    PHYSICAL EXAMINATION     Vitals:   02/04/23 1413  BP: (!) 140/77  Pulse: 84  Resp: 18  Temp: 97.9 F (36.6 C)  SpO2: 96%    Physical Exam  LABORATORY DATA:  CBC    Component Value Date/Time   WBC 2.5 (L) 02/04/2023 1328   WBC 2.4 (L) 01/07/2023 0907   RBC 3.67 (L) 02/04/2023 1328   HGB 11.8 (L) 02/04/2023 1328   HCT 35.1 (L) 02/04/2023 1328   HCT 39.0 11/24/2022 1035   PLT 47 (L) 02/04/2023 1328   MCV 95.6 02/04/2023 1328   MCH 32.2 02/04/2023 1328   MCHC 33.6 02/04/2023 1328   RDW 19.2 (H) 02/04/2023 1328   LYMPHSABS 0.9 02/04/2023 1328   MONOABS 0.5 02/04/2023 1328   EOSABS 0.0 02/04/2023 1328   BASOSABS 0.0 02/04/2023 1328    CMP     Component Value Date/Time   NA 136 02/04/2023 1328   K 3.7 02/04/2023 1328   CL 103 02/04/2023 1328   CO2 24 02/04/2023 1328   GLUCOSE 95 02/04/2023 1328   BUN 14 02/04/2023 1328   CREATININE 0.43 (L) 02/04/2023 1328   CALCIUM 8.9 02/04/2023 1328   GFRNONAA >60 02/04/2023 1328    ASSESSMENT and THERAPY PLAN:   No problem-specific Assessment & Plan notes found for this encounter.    No  orders of the defined types were placed in this encounter.   All questions were answered. The patient knows to call the clinic with any problems, questions or concerns. We can certainly see the patient much sooner if necessary. This note was electronically signed. Rachel Moulds, MD 02/04/2023

## 2023-02-04 NOTE — Assessment & Plan Note (Signed)
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  If left untreated MDS could progress to acute leukemia and also worsen the cytopenias.  Recommendation: FISH panel for MDS, this has been added.  Pending results.   Treatment with Vidaza  I discussed the risks and benefits of 5 days and patient is willing to proceed.  We have once again discussed all the above-mentioned findings.  Patient is interested in proceeding with treatment.  We have reviewed the adverse effects and the need to monitor labs every week with intermittent transfusions as needed.  She is agreeable and will proceed with treatment.  She will return to clinic in 1 week with repeat labs.

## 2023-02-05 ENCOUNTER — Inpatient Hospital Stay: Payer: Medicare Other

## 2023-02-05 ENCOUNTER — Other Ambulatory Visit: Payer: Self-pay | Admitting: Allergy & Immunology

## 2023-02-05 ENCOUNTER — Other Ambulatory Visit: Payer: Self-pay | Admitting: *Deleted

## 2023-02-05 ENCOUNTER — Encounter: Payer: Self-pay | Admitting: Hematology and Oncology

## 2023-02-05 VITALS — BP 122/63 | HR 85 | Temp 98.0°F | Resp 18 | Wt 191.2 lb

## 2023-02-05 DIAGNOSIS — D46Z Other myelodysplastic syndromes: Secondary | ICD-10-CM

## 2023-02-05 DIAGNOSIS — D696 Thrombocytopenia, unspecified: Secondary | ICD-10-CM | POA: Diagnosis not present

## 2023-02-05 DIAGNOSIS — I878 Other specified disorders of veins: Secondary | ICD-10-CM

## 2023-02-05 MED ORDER — ONDANSETRON HCL 8 MG PO TABS
8.0000 mg | ORAL_TABLET | Freq: Once | ORAL | Status: AC
  Administered 2023-02-05: 8 mg via ORAL
  Filled 2023-02-05: qty 1

## 2023-02-05 MED ORDER — SODIUM CHLORIDE 0.9 % IV SOLN
75.0000 mg/m2 | Freq: Once | INTRAVENOUS | Status: AC
  Administered 2023-02-05: 153 mg via INTRAVENOUS
  Filled 2023-02-05: qty 15.3

## 2023-02-05 MED ORDER — SODIUM CHLORIDE 0.9 % IV SOLN: Freq: Once | INTRAVENOUS | Status: AC

## 2023-02-05 NOTE — Patient Instructions (Signed)
[facility] [facility]  Discharge Instructions: Thank you for choosing [facility] to provide your oncology and hematology care.   If you have a lab appointment with the Cancer Center, please go directly to the Cancer Center and check in at the registration area.   Wear comfortable clothing and clothing appropriate for easy access to any Portacath or PICC line.   We strive to give you quality time with your provider. You may need to reschedule your appointment if you arrive late (15 or more minutes).  Arriving late affects you and other patients whose appointments are after yours.  Also, if you miss three or more appointments without notifying the office, you may be dismissed from the clinic at the provider's discretion.      For prescription refill requests, have your pharmacy contact our office and allow 72 hours for refills to be completed.    Today you received the following chemotherapy and/or immunotherapy agents: Vidaza     To help prevent nausea and vomiting after your treatment, we encourage you to take your nausea medication as directed.  BELOW ARE SYMPTOMS THAT SHOULD BE REPORTED IMMEDIATELY: *FEVER GREATER THAN 100.4 F (38 C) OR HIGHER *CHILLS OR SWEATING *NAUSEA AND VOMITING THAT IS NOT CONTROLLED WITH YOUR NAUSEA MEDICATION *UNUSUAL SHORTNESS OF BREATH *UNUSUAL BRUISING OR BLEEDING *URINARY PROBLEMS (pain or burning when urinating, or frequent urination) *BOWEL PROBLEMS (unusual diarrhea, constipation, pain near the anus) TENDERNESS IN MOUTH AND THROAT WITH OR WITHOUT PRESENCE OF ULCERS (sore throat, sores in mouth, or a toothache) UNUSUAL RASH, SWELLING OR PAIN  UNUSUAL VAGINAL DISCHARGE OR ITCHING   Items with * indicate a potential emergency and should be followed up as soon as possible or go to the Emergency Department if any problems should occur.  Please show the CHEMOTHERAPY ALERT CARD or IMMUNOTHERAPY ALERT CARD at check-in  to the Emergency Department and triage nurse.  Should you have questions after your visit or need to cancel or reschedule your appointment, please contact [facility]  Dept: [phone]  and follow the prompts.  Office hours are 8:00 a.m. to 4:30 p.m. Monday - Friday. Please note that voicemails left after 4:00 p.m. may not be returned until the following business day.  We are closed weekends and major holidays. You have access to a nurse at all times for urgent questions. Please call the main number to the clinic Dept: [phone] and follow the prompts.   For any non-urgent questions, you may also contact your provider using MyChart. We now offer e-Visits for anyone 18 and older to request care online for non-urgent symptoms. For details visit [email or web address].   Also download the MyChart app! Go to the app store, search "MyChart", open the app, select [place], and log in with your MyChart username and password.  

## 2023-02-06 ENCOUNTER — Other Ambulatory Visit: Payer: Self-pay

## 2023-02-06 ENCOUNTER — Other Ambulatory Visit: Payer: Self-pay | Admitting: Hematology and Oncology

## 2023-02-06 ENCOUNTER — Inpatient Hospital Stay: Payer: Medicare Other

## 2023-02-06 ENCOUNTER — Telehealth: Payer: Self-pay | Admitting: Hematology and Oncology

## 2023-02-06 VITALS — BP 103/55 | HR 84 | Temp 99.0°F | Resp 16 | Wt 191.8 lb

## 2023-02-06 DIAGNOSIS — D46Z Other myelodysplastic syndromes: Secondary | ICD-10-CM

## 2023-02-06 DIAGNOSIS — D696 Thrombocytopenia, unspecified: Secondary | ICD-10-CM

## 2023-02-06 DIAGNOSIS — R21 Rash and other nonspecific skin eruption: Secondary | ICD-10-CM

## 2023-02-06 LAB — CBC WITH DIFFERENTIAL (CANCER CENTER ONLY)
Abs Immature Granulocytes: 0 10*3/uL (ref 0.00–0.07)
Basophils Absolute: 0 10*3/uL (ref 0.0–0.1)
Basophils Relative: 0 %
Eosinophils Absolute: 0 10*3/uL (ref 0.0–0.5)
Eosinophils Relative: 0 %
HCT: 35.7 % — ABNORMAL LOW (ref 36.0–46.0)
Hemoglobin: 11.5 g/dL — ABNORMAL LOW (ref 12.0–15.0)
Immature Granulocytes: 0 %
Lymphocytes Relative: 29 %
Lymphs Abs: 0.7 10*3/uL (ref 0.7–4.0)
MCH: 31.4 pg (ref 26.0–34.0)
MCHC: 32.2 g/dL (ref 30.0–36.0)
MCV: 97.5 fL (ref 80.0–100.0)
Monocytes Absolute: 0.5 10*3/uL (ref 0.1–1.0)
Monocytes Relative: 21 %
Neutro Abs: 1.1 10*3/uL — ABNORMAL LOW (ref 1.7–7.7)
Neutrophils Relative %: 50 %
Platelet Count: 35 10*3/uL — ABNORMAL LOW (ref 150–400)
RBC: 3.66 MIL/uL — ABNORMAL LOW (ref 3.87–5.11)
RDW: 18.9 % — ABNORMAL HIGH (ref 11.5–15.5)
WBC Count: 2.3 10*3/uL — ABNORMAL LOW (ref 4.0–10.5)
nRBC: 0 % (ref 0.0–0.2)

## 2023-02-06 MED ORDER — DIPHENHYDRAMINE HCL 25 MG PO CAPS
25.0000 mg | ORAL_CAPSULE | Freq: Once | ORAL | Status: AC
  Administered 2023-02-06: 25 mg via ORAL
  Filled 2023-02-06: qty 1

## 2023-02-06 MED ORDER — ONDANSETRON HCL 8 MG PO TABS
8.0000 mg | ORAL_TABLET | Freq: Once | ORAL | Status: AC
  Administered 2023-02-06: 8 mg via ORAL
  Filled 2023-02-06: qty 1

## 2023-02-06 MED ORDER — SODIUM CHLORIDE 0.9 % IV SOLN: Freq: Once | INTRAVENOUS | Status: AC

## 2023-02-06 MED ORDER — SODIUM CHLORIDE 0.9 % IV SOLN
75.0000 mg/m2 | Freq: Once | INTRAVENOUS | Status: AC
  Administered 2023-02-06: 153 mg via INTRAVENOUS
  Filled 2023-02-06: qty 15.3

## 2023-02-06 MED ORDER — PREDNISONE 5 MG PO TABS: 10.0000 mg | ORAL_TABLET | Freq: Every day | ORAL | 0 refills | Status: DC

## 2023-02-06 NOTE — Patient Instructions (Signed)
[facility]  Discharge Instructions: Thank you for choosing [facility] to provide your oncology and hematology care.   If you have a lab appointment with the Cancer Center, please go directly to the Cancer Center and check in at the registration area.   Wear comfortable clothing and clothing appropriate for easy access to any Portacath or PICC line.   We strive to give you quality time with your provider. You may need to reschedule your appointment if you arrive late (15 or more minutes).  Arriving late affects you and other patients whose appointments are after yours.  Also, if you miss three or more appointments without notifying the office, you may be dismissed from the clinic at the provider's discretion.      For prescription refill requests, have your pharmacy contact our office and allow 72 hours for refills to be completed.    Today you received the following chemotherapy and/or immunotherapy agents: Vidaza.       To help prevent nausea and vomiting after your treatment, we encourage you to take your nausea medication as directed.  BELOW ARE SYMPTOMS THAT SHOULD BE REPORTED IMMEDIATELY: *FEVER GREATER THAN 100.4 F (38 C) OR HIGHER *CHILLS OR SWEATING *NAUSEA AND VOMITING THAT IS NOT CONTROLLED WITH YOUR NAUSEA MEDICATION *UNUSUAL SHORTNESS OF BREATH *UNUSUAL BRUISING OR BLEEDING *URINARY PROBLEMS (pain or burning when urinating, or frequent urination) *BOWEL PROBLEMS (unusual diarrhea, constipation, pain near the anus) TENDERNESS IN MOUTH AND THROAT WITH OR WITHOUT PRESENCE OF ULCERS (sore throat, sores in mouth, or a toothache) UNUSUAL RASH, SWELLING OR PAIN  UNUSUAL VAGINAL DISCHARGE OR ITCHING   Items with * indicate a potential emergency and should be followed up as soon as possible or go to the Emergency Department if any problems should occur.  Please show the CHEMOTHERAPY ALERT CARD or IMMUNOTHERAPY ALERT CARD at  check-in to the Emergency Department and triage nurse.  Should you have questions after your visit or need to cancel or reschedule your appointment, please contact [facility]  Dept: [phone]  and follow the prompts.  Office hours are 8:00 a.m. to 4:30 p.m. Monday - Friday. Please note that voicemails left after 4:00 p.m. may not be returned until the following business day.  We are closed weekends and major holidays. You have access to a nurse at all times for urgent questions. Please call the main number to the clinic Dept: [phone] and follow the prompts.   For any non-urgent questions, you may also contact your provider using MyChart. We now offer e-Visits for anyone 100 and older to request care online for non-urgent symptoms. For details visit [email or web address].   Also download the MyChart app! Go to the app store, search "MyChart", open the app, select [facility], and log in with your MyChart username and password.  Azacitidine Injection What is this medication? AZACITIDINE (ay za SITE i deen) treats blood and bone marrow cancers. It works by slowing down the growth of cancer cells. This medicine may be used for other purposes; ask your health care provider or pharmacist if you have questions. COMMON BRAND NAME(S): Vidaza What should I tell my care team before I take this medication? They need to know if you have any of these conditions: Kidney disease Liver disease Low blood cell levels, such as low white cells, platelets, or red blood cells Low levels of albumin in the blood Low levels of bicarbonate in the  blood An unusual or allergic reaction to azacitidine, mannitol, other medications, foods, dyes, or preservatives If you or your partner are pregnant or trying to get pregnant Breast-feeding How should I use this medication? This medication is injected into a vein or under the skin. It is given by your care team in a hospital or  clinic setting. Talk to your care team about the use of this medication in children. While it may be prescribed for children as young as 1 month for selected conditions, precautions do apply. Overdosage: If you think you have taken too much of this medicine contact a poison control center or emergency room at once. NOTE: This medicine is only for you. Do not share this medicine with others. What if I miss a dose? Keep appointments for follow-up doses. It is important not to miss your dose. Call your care team if you are unable to keep an appointment. What may interact with this medication? Interactions are not expected. This list may not describe all possible interactions. Give your health care provider a list of all the medicines, herbs, non-prescription drugs, or dietary supplements you use. Also tell them if you smoke, drink alcohol, or use illegal drugs. Some items may interact with your medicine. What should I watch for while using this medication? Your condition will be monitored carefully while you are receiving this medication. You may need blood work while taking this medication. This medication may make you feel generally unwell. This is not uncommon as chemotherapy can affect healthy cells as well as cancer cells. Report any side effects. Continue your course of treatment even though you feel ill unless your care team tells you to stop. Other product types may be available that contain the medication azacitidine. The injection and oral products should not be used in place of one another. Talk to your care team if you have questions. This medication can cause serious side effects. To reduce the risk, your care team may give you other medications to take before receiving this one. Be sure to follow the directions from your care team. This medication may increase your risk of getting an infection. Call your care team for advice if you get a fever, chills, sore throat, or other symptoms of a  cold or flu. Do not treat yourself. Try to avoid being around people who are sick. Avoid taking medications that contain aspirin, acetaminophen, ibuprofen, naproxen, or ketoprofen unless instructed by your care team. These medications may hide a fever. This medication may increase your risk to bruise or bleed. Call your care team if you notice any unusual bleeding. Be careful brushing or flossing your teeth or using a toothpick because you may get an infection or bleed more easily. If you have any dental work done, tell your dentist you are receiving this medication. Talk to your care team if you or your partner may be pregnant. Serious birth defects can occur if you take this medication during pregnancy and for 6 months after the last dose. You will need a negative pregnancy test before starting this medication. Contraception is recommended while taking his medication and for 6 months after the last dose. Your care team can help you find the option that works for you. If your partner can get pregnant, use a condom during sex while taking this medication and for 3 months after the last dose. Do not breastfeed while taking this medication and for 1 week after the last dose. This medication may cause infertility. Talk to your  care team if you are concerned about your fertility. What side effects may I notice from receiving this medication? Side effects that you should report to your care team as soon as possible: Allergic reactions--skin rash, itching, hives, swelling of the face, lips, tongue, or throat Infection--fever, chills, cough, sore throat, wounds that don't heal, pain or trouble when passing urine, general feeling of discomfort or being unwell Kidney injury--decrease in the amount of urine, swelling of the ankles, hands, or feet Liver injury--right upper belly pain, loss of appetite, nausea, light-colored stool, dark yellow or brown urine, yellowing skin or eyes, unusual weakness or fatigue Low  red blood cell level--unusual weakness or fatigue, dizziness, headache, trouble breathing Tumor lysis syndrome (TLS)--nausea, vomiting, diarrhea, decrease in the amount of urine, dark urine, unusual weakness or fatigue, confusion, muscle pain or cramps, fast or irregular heartbeat, joint pain Unusual bruising or bleeding Side effects that usually do not require medical attention (report to your care team if they continue or are bothersome): Constipation Diarrhea Nausea Pain, redness, or irritation at injection site Vomiting This list may not describe all possible side effects. Call your doctor for medical advice about side effects. You may report side effects to FDA at 1-800-FDA-1088. Where should I keep my medication? This medication is given in a hospital or clinic. It will not be stored at home. NOTE: This sheet is a summary. It may not cover all possible information. If you have questions about this medicine, talk to your doctor, pharmacist, or health care provider.  2024 Elsevier/Gold Standard (2021-10-19 00:00:00)

## 2023-02-06 NOTE — Telephone Encounter (Signed)
Patient is aware of scheduled appointment times/dates

## 2023-02-07 ENCOUNTER — Inpatient Hospital Stay: Payer: Medicare Other

## 2023-02-07 ENCOUNTER — Other Ambulatory Visit: Payer: Self-pay | Admitting: *Deleted

## 2023-02-07 ENCOUNTER — Inpatient Hospital Stay (HOSPITAL_BASED_OUTPATIENT_CLINIC_OR_DEPARTMENT_OTHER): Payer: Medicare Other | Admitting: Hematology and Oncology

## 2023-02-07 VITALS — BP 134/72 | HR 96 | Temp 98.1°F | Resp 18 | Ht 68.0 in | Wt 190.4 lb

## 2023-02-07 DIAGNOSIS — R21 Rash and other nonspecific skin eruption: Secondary | ICD-10-CM

## 2023-02-07 DIAGNOSIS — D46Z Other myelodysplastic syndromes: Secondary | ICD-10-CM | POA: Diagnosis not present

## 2023-02-07 DIAGNOSIS — D696 Thrombocytopenia, unspecified: Secondary | ICD-10-CM | POA: Diagnosis not present

## 2023-02-07 MED ORDER — METHYLPREDNISOLONE SODIUM SUCC 40 MG IJ SOLR: 40.0000 mg | Freq: Once | INTRAMUSCULAR | Status: DC

## 2023-02-07 MED ORDER — METHYLPREDNISOLONE SODIUM SUCC 40 MG IJ SOLR
40.0000 mg | Freq: Once | INTRAMUSCULAR | Status: AC
  Administered 2023-02-07: 40 mg via INTRAMUSCULAR
  Filled 2023-02-07: qty 1

## 2023-02-07 MED ORDER — HYDROXYZINE PAMOATE 25 MG PO CAPS: 25.0000 mg | ORAL_CAPSULE | Freq: Two times a day (BID) | ORAL | 0 refills | Status: DC

## 2023-02-07 NOTE — Assessment & Plan Note (Signed)
01/07/2023: Bone marrow aspirate: Hypercellular bone marrow 50% cellularity with dysplasia, flow cytometry: 5% CD34 positive blasts, cytogenetics: Normal She is now on Vidaza for myelodysplastic syndrome and is here for an interim visit with skin rash.  She appears to have a generalized skin eruption which started a couple days ago.  Initially she thought it was related to an insect bite.  This has happened, however gotten worse after her infusion yesterday.  She has intense pruritus associated with it. Physical examination, with generalized skin eruption, maculopapular rash.  No oral lesions.  At this time we suspect azacitidine related skin rash, this has been daily described.  I recommended holding azacitidine, will give her a dose of Solu-Medrol today.  From tomorrow she will start prednisone 20 mg daily, Vistaril twice daily.  We have asked her to hold loratadine while she is on Restoril.  She will return to clinic in approximately 5 days.  If the rash resolves, we can either try to rechallenge azacitidine or switch her to decitabine.

## 2023-02-07 NOTE — Progress Notes (Signed)
[facility] Cancer Follow up:    [name] [name], [name], MD [address] [place] [address]   DIAGNOSIS: Thrombocytopenia  SUMMARY OF ONCOLOGIC HISTORY: Evaluated by Dr. Al Pimple on 11/24/2022, blood counts showed persistent thrombocytopenia, macrocytosis, normal b12 level, and normal iron studies.   Bone marrow biopsy completed on 7/22/2024cellular  bone marrow with focal areas of hypercellularity (approximately up to  50%).  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  Blasts appear  mildly mildly increased, approximately 5% to focally up to 10%.  Flow  cytometric analysis reveals 5% CD34 positive blasts.  While the findings could be attributable to the patient's coexisting conditions, the possibility of a myeloid neoplasm such as myelodysplastic neoplasm with increased blasts cannot be entirely excluded. , normal karyotype  CURRENT THERAPY:  INTERVAL HISTORY:  [name] [name] 77 y.o. female returns for follow up. Since last visit, she was seen by Dr Pamelia Hoit and his recommendation was to consider azacytidine. She is here for an interim visit with skin rash.  Skin rash is diffuse all over the trunk, and the armpits, and the groin.  It has gotten worse since yesterday.  Initially she wondered if it was related to an insect bite but now she is worried if it is related to the drug.  The rash is very itchy.  Other than that she denies any fevers or chills.  No rash inside her mouth.  She is able to eat and swallow well.  Rest of the pertinent 10 point ROS reviewed and negative   Patient Active Problem List   Diagnosis Date Noted   MDS (myelodysplastic syndrome), high grade (HCC) 01/29/2023   Obesity 03/02/2021   Other long term (current) drug therapy 03/02/2021   Primary osteoarthritis 03/02/2021   SOB (shortness of breath) 12/08/2020   Pericardial effusion 12/08/2020   Dry eyes/dry mouth 05/25/2019   Bronchitis, mucopurulent recurrent (HCC) 02/26/2019   Moderate  persistent asthma without complication 02/24/2019   Perennial and seasonal allergic rhinitis 02/24/2019   Seasonal and perennial allergic rhinoconjunctivitis 02/24/2019   Primary osteoarthritis of both knees 04/10/2016   Acute chest pain 08/19/2014   Costochondritis 08/19/2014   Lung nodule 08/19/2014   HTN (hypertension) 08/07/2012   HLD (hyperlipidemia) 09/26/2011   Asthma 09/06/2010   Depression 09/06/2010   History of allergy 09/06/2010   Hypothyroidism 09/06/2010   Lower back pain 09/06/2010   Arthritis 09/06/2010   Pain in joint, pelvic region and thigh 09/06/2010   Peripheral neuropathy 09/06/2010   Psoriasis 09/06/2010   Therapeutic drug monitoring 09/06/2010   Tinnitus 09/06/2010   Vitamin B 12 deficiency 09/06/2010   Cervical spondylosis without myelopathy 04/24/2006   Tear of lateral cartilage or meniscus of knee, current 03/13/2006    is allergic to latex, cat hair extract, dust mite extract, molds & smuts, other, and pollen extract-tree extract [pollen extract].  MEDICAL HISTORY: Past Medical History:  Diagnosis Date   Asthma    Diverticulitis    Hypothyroid    Psoriatic arthritis (HCC)     SURGICAL HISTORY: Past Surgical History:  Procedure Laterality Date   COLONOSCOPY  08/2021   HIP SURGERY Bilateral    TONSILLECTOMY     TUBAL LIGATION      SOCIAL HISTORY: Social History   Socioeconomic History   Marital status: Widowed    Spouse name: Not on file   Number of children: Not on file   Years of education: Not on file   Highest education level: Not on  file  Occupational History   Not on file  Tobacco Use   Smoking status: Former    Current packs/day: 0.00    Average packs/day: 0.1 packs/day for 33.4 years (3.3 ttl pk-yrs)    Types: Cigarettes    Start date: 50    Quit date: 11/18/2000    Years since quitting: 22.2   Smokeless tobacco: Never   Tobacco comments:    3 cigarettes a day  Vaping Use   Vaping status: Never Used  Substance and  Sexual Activity   Alcohol use: Never   Drug use: Never   Sexual activity: Not Currently  Other Topics Concern   Not on file  Social History Narrative   Lives with children   Social Determinants of Health   Financial Resource Strain: Not on file  Food Insecurity: No Food Insecurity (11/24/2022)   Hunger Vital Sign    Worried About Running Out of Food in the Last Year: Never true    Ran Out of Food in the Last Year: Never true  Transportation Needs: No Transportation Needs (11/24/2022)   PRAPARE - Administrator, Civil Service (Medical): No    Lack of Transportation (Non-Medical): No  Physical Activity: Not on file  Stress: Not on file  Social Connections: Not on file  Intimate Partner Violence: Not At Risk (11/24/2022)   Humiliation, Afraid, Rape, and Kick questionnaire    Fear of Current or Ex-Partner: No    Emotionally Abused: No    Physically Abused: No    Sexually Abused: No    FAMILY HISTORY: Family History  Problem Relation Age of Onset   Asthma Mother    Allergic rhinitis Mother    Rheum arthritis Mother    Allergic rhinitis Father    Alzheimer's disease Father    Eczema Brother    Allergic rhinitis Brother    Colon cancer Neg Hx    Esophageal cancer Neg Hx    Stomach cancer Neg Hx    Rectal cancer Neg Hx      PHYSICAL EXAMINATION     Vitals:   02/07/23 1451  BP: 134/72  Pulse: 96  Resp: 18  Temp: 98.1 F (36.7 C)  SpO2: 94%   General Appearance: Alert, oriented and in no acute distress. Skin: Maculopapular rash on the trunk as well as the chest wall, extending to the groin, buttocks.  No oral lesions.  No pustular lesions.  LABORATORY DATA:  CBC    Component Value Date/Time   WBC 2.3 (L) 02/06/2023 1500   WBC 2.4 (L) 01/07/2023 0907   RBC 3.66 (L) 02/06/2023 1500   HGB 11.5 (L) 02/06/2023 1500   HCT 35.7 (L) 02/06/2023 1500   HCT 39.0 11/24/2022 1035   PLT 35 (L) 02/06/2023 1500   MCV 97.5 02/06/2023 1500   MCH 31.4 02/06/2023  1500   MCHC 32.2 02/06/2023 1500   RDW 18.9 (H) 02/06/2023 1500   LYMPHSABS 0.7 02/06/2023 1500   MONOABS 0.5 02/06/2023 1500   EOSABS 0.0 02/06/2023 1500   BASOSABS 0.0 02/06/2023 1500    CMP     Component Value Date/Time   NA 136 02/04/2023 1328   K 3.7 02/04/2023 1328   CL 103 02/04/2023 1328   CO2 24 02/04/2023 1328   GLUCOSE 95 02/04/2023 1328   BUN 14 02/04/2023 1328   CREATININE 0.43 (L) 02/04/2023 1328   CALCIUM 8.9 02/04/2023 1328   GFRNONAA >60 02/04/2023 1328    ASSESSMENT and THERAPY PLAN:  MDS (myelodysplastic syndrome), high grade (HCC) 01/07/2023: Bone marrow aspirate: Hypercellular bone marrow 50% cellularity with dysplasia, flow cytometry: 5% CD34 positive blasts, cytogenetics: Normal She is now on Vidaza for myelodysplastic syndrome and is here for an interim visit with skin rash.  She appears to have a generalized skin eruption which started a couple days ago.  Initially she thought it was related to an insect bite.  This has happened, however gotten worse after her infusion yesterday.  She has intense pruritus associated with it. Physical examination, with generalized skin eruption, maculopapular rash.  No oral lesions.  At this time we suspect azacitidine related skin rash, this has been daily described.  I recommended holding azacitidine, will give her a dose of Solu-Medrol today.  From tomorrow she will start prednisone 20 mg daily, Vistaril twice daily.  We have asked her to hold loratadine while she is on Restoril.  She will return to clinic in approximately 5 days.  If the rash resolves, we can either try to rechallenge azacitidine or switch her to decitabine.    All questions were answered. The patient knows to call the clinic with any problems, questions or concerns. We can certainly see the patient much sooner if necessary. This note was electronically signed.  Rachel Moulds, MD 02/07/2023

## 2023-02-07 NOTE — Progress Notes (Signed)
Pt to get solumedrol 40 mg IM vs IV

## 2023-02-08 ENCOUNTER — Inpatient Hospital Stay: Payer: Medicare Other

## 2023-02-08 ENCOUNTER — Telehealth: Payer: Self-pay | Admitting: *Deleted

## 2023-02-08 MED ORDER — PREDNISONE 20 MG PO TABS: 20.0000 mg | ORAL_TABLET | Freq: Every day | ORAL | 0 refills | Status: DC

## 2023-02-08 NOTE — Telephone Encounter (Signed)
This RN attempted to contact pt per need to start oral steroids at 20 mg daily in addition to use of vistaril for rash.  Post several attempts with no answer- this RN left VM with detailed instructions of above as well as request for return call to this RN for update on rash and control of symptoms.

## 2023-02-12 ENCOUNTER — Encounter: Payer: Self-pay | Admitting: Adult Health

## 2023-02-12 ENCOUNTER — Encounter: Payer: Self-pay | Admitting: Hematology and Oncology

## 2023-02-12 ENCOUNTER — Inpatient Hospital Stay (HOSPITAL_BASED_OUTPATIENT_CLINIC_OR_DEPARTMENT_OTHER): Payer: Medicare Other | Admitting: Adult Health

## 2023-02-12 ENCOUNTER — Inpatient Hospital Stay: Payer: Medicare Other

## 2023-02-12 VITALS — BP 115/56 | HR 76 | Temp 99.2°F | Resp 18 | Ht 68.0 in | Wt 186.2 lb

## 2023-02-12 DIAGNOSIS — D46Z Other myelodysplastic syndromes: Secondary | ICD-10-CM

## 2023-02-12 DIAGNOSIS — D696 Thrombocytopenia, unspecified: Secondary | ICD-10-CM

## 2023-02-12 LAB — CBC WITH DIFFERENTIAL/PLATELET
Abs Immature Granulocytes: 0.01 10*3/uL (ref 0.00–0.07)
Basophils Absolute: 0 10*3/uL (ref 0.0–0.1)
Basophils Relative: 0 %
Eosinophils Absolute: 0 10*3/uL (ref 0.0–0.5)
Eosinophils Relative: 0 %
HCT: 35.2 % — ABNORMAL LOW (ref 36.0–46.0)
Hemoglobin: 11.7 g/dL — ABNORMAL LOW (ref 12.0–15.0)
Immature Granulocytes: 0 %
Lymphocytes Relative: 52 %
Lymphs Abs: 1.4 10*3/uL (ref 0.7–4.0)
MCH: 32.1 pg (ref 26.0–34.0)
MCHC: 33.2 g/dL (ref 30.0–36.0)
MCV: 96.4 fL (ref 80.0–100.0)
Monocytes Absolute: 0.1 10*3/uL (ref 0.1–1.0)
Monocytes Relative: 5 %
Neutro Abs: 1.2 10*3/uL — ABNORMAL LOW (ref 1.7–7.7)
Neutrophils Relative %: 43 %
Platelets: 94 10*3/uL — ABNORMAL LOW (ref 150–400)
RBC: 3.65 MIL/uL — ABNORMAL LOW (ref 3.87–5.11)
RDW: 18.8 % — ABNORMAL HIGH (ref 11.5–15.5)
WBC: 2.7 10*3/uL — ABNORMAL LOW (ref 4.0–10.5)
nRBC: 0 % (ref 0.0–0.2)

## 2023-02-12 MED ORDER — HYDROXYZINE PAMOATE 25 MG PO CAPS
25.0000 mg | ORAL_CAPSULE | Freq: Two times a day (BID) | ORAL | 0 refills | Status: DC | PRN
Start: 2023-02-12 — End: 2023-03-22

## 2023-02-12 NOTE — Progress Notes (Signed)
[facility] Cancer Follow up:    [name], [name], MD [address] [place] [address]   DIAGNOSIS: Myelodysplastic Syndrome  SUMMARY OF ONCOLOGIC HISTORY: Oncology History  MDS (myelodysplastic syndrome), high grade (HCC)  01/07/2023 Initial Biopsy   Bone marrow biopsy:cellular bone marrow with focal areas of hypercellularity (approximately up to  50%).  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  Blasts appear mildly mildly increased, approximately 5% to focally up to 10%.  Flow cytometric analysis reveals 5% CD34 positive blasts.  While the findings could be attributable to the patient's coexisting conditions, the possibility of a myeloid neoplasm such as myelodysplastic neoplasm with increased blasts cannot be entirely excluded. normal karyotype   01/29/2023 Initial Diagnosis   MDS (myelodysplastic syndrome), high grade (HCC)   02/04/2023 - 02/06/2023 Chemotherapy   Patient is on Treatment Plan : MYELODYSPLASIA  Azacitidine IV D1-5 q28d       CURRENT THERAPY: Azacitidine   INTERVAL HISTORY: [name] [name] 77 y.o. female returns for follow-up after receiving azacitidine.  Unfortunately 2 to 3 days into the azacitidine she developed a rash and azacitidine was held.  She was administered Solu-Medrol and started on a steroid taper.She has been taking Hydroxyzine which is helping the itching as well.  She notes that she is discouraged because other than the rash she tolerated the azacitidine well.   Patient Active Problem List   Diagnosis Date Noted   MDS (myelodysplastic syndrome), high grade (HCC) 01/29/2023   Obesity 03/02/2021   Other long term (current) drug therapy 03/02/2021   Primary osteoarthritis 03/02/2021   SOB (shortness of breath) 12/08/2020   Pericardial effusion 12/08/2020   Dry eyes/dry mouth 05/25/2019   Bronchitis, mucopurulent recurrent (HCC) 02/26/2019   Moderate persistent asthma without complication 02/24/2019   Perennial and  seasonal allergic rhinitis 02/24/2019   Seasonal and perennial allergic rhinoconjunctivitis 02/24/2019   Primary osteoarthritis of both knees 04/10/2016   Acute chest pain 08/19/2014   Costochondritis 08/19/2014   Lung nodule 08/19/2014   HTN (hypertension) 08/07/2012   HLD (hyperlipidemia) 09/26/2011   Asthma 09/06/2010   Depression 09/06/2010   History of allergy 09/06/2010   Hypothyroidism 09/06/2010   Lower back pain 09/06/2010   Arthritis 09/06/2010   Pain in joint, pelvic region and thigh 09/06/2010   Peripheral neuropathy 09/06/2010   Psoriasis 09/06/2010   Therapeutic drug monitoring 09/06/2010   Tinnitus 09/06/2010   Vitamin B 12 deficiency 09/06/2010   Cervical spondylosis without myelopathy 04/24/2006   Tear of lateral cartilage or meniscus of knee, current 03/13/2006    is allergic to latex, cat hair extract, dust mite extract, molds & smuts, other, and pollen extract-tree extract [pollen extract].  MEDICAL HISTORY: Past Medical History:  Diagnosis Date   Asthma    Diverticulitis    Hypothyroid    Psoriatic arthritis (HCC)     SURGICAL HISTORY: Past Surgical History:  Procedure Laterality Date   COLONOSCOPY  08/2021   HIP SURGERY Bilateral    TONSILLECTOMY     TUBAL LIGATION      SOCIAL HISTORY: Social History   Socioeconomic History   Marital status: Widowed    Spouse name: Not on file   Number of children: Not on file   Years of education: Not on file   Highest education level: Not on file  Occupational History   Not on file  Tobacco Use   Smoking status: Former    Current packs/day: 0.00    Average packs/day: 0.1  packs/day for 33.4 years (3.3 ttl pk-yrs)    Types: Cigarettes    Start date: 30    Quit date: 11/18/2000    Years since quitting: 22.2   Smokeless tobacco: Never   Tobacco comments:    3 cigarettes a day  Vaping Use   Vaping status: Never Used  Substance and Sexual Activity   Alcohol use: Never   Drug use: Never   Sexual  activity: Not Currently  Other Topics Concern   Not on file  Social History Narrative   Lives with children   Social Determinants of Health   Financial Resource Strain: Not on file  Food Insecurity: No Food Insecurity (11/24/2022)   Hunger Vital Sign    Worried About Running Out of Food in the Last Year: Never true    Ran Out of Food in the Last Year: Never true  Transportation Needs: No Transportation Needs (11/24/2022)   PRAPARE - Administrator, Civil Service (Medical): No    Lack of Transportation (Non-Medical): No  Physical Activity: Not on file  Stress: Not on file  Social Connections: Not on file  Intimate Partner Violence: Not At Risk (11/24/2022)   Humiliation, Afraid, Rape, and Kick questionnaire    Fear of Current or Ex-Partner: No    Emotionally Abused: No    Physically Abused: No    Sexually Abused: No    FAMILY HISTORY: Family History  Problem Relation Age of Onset   Asthma Mother    Allergic rhinitis Mother    Rheum arthritis Mother    Allergic rhinitis Father    Alzheimer's disease Father    Eczema Brother    Allergic rhinitis Brother    Colon cancer Neg Hx    Esophageal cancer Neg Hx    Stomach cancer Neg Hx    Rectal cancer Neg Hx     Review of Systems  Constitutional:  Negative for appetite change, chills, fatigue, fever and unexpected weight change.  HENT:   Negative for hearing loss, lump/mass and trouble swallowing.   Eyes:  Negative for eye problems and icterus.  Respiratory:  Negative for chest tightness, cough and shortness of breath.   Cardiovascular:  Negative for chest pain, leg swelling and palpitations.  Gastrointestinal:  Negative for abdominal distention, abdominal pain, constipation, diarrhea, nausea and vomiting.  Endocrine: Negative for hot flashes.  Genitourinary:  Negative for difficulty urinating.   Musculoskeletal:  Negative for arthralgias.  Skin:  Negative for itching and rash.  Neurological:  Negative for dizziness,  extremity weakness, headaches and numbness.  Hematological:  Negative for adenopathy. Does not bruise/bleed easily.  Psychiatric/Behavioral:  Negative for depression. The patient is not nervous/anxious.       PHYSICAL EXAMINATION   Onc Performance Status - 02/12/23 1101       ECOG Perf Status   ECOG Perf Status Restricted in physically strenuous activity but ambulatory and able to carry out work of a light or sedentary nature, e.g., light house work, office work      KPS SCALE   KPS % SCORE Able to carry on normal activity, minor s/s of disease             Vitals:   02/12/23 1058  BP: (!) 115/56  Pulse: 76  Resp: 18  Temp: 99.2 F (37.3 C)  SpO2: 98%    Physical Exam Constitutional:      General: She is not in acute distress.    Appearance: Normal appearance. She is not  toxic-appearing.  HENT:     Head: Normocephalic and atraumatic.     Mouth/Throat:     Mouth: Mucous membranes are moist.     Pharynx: Oropharynx is clear. No oropharyngeal exudate or posterior oropharyngeal erythema.  Eyes:     General: No scleral icterus. Cardiovascular:     Rate and Rhythm: Normal rate and regular rhythm.     Pulses: Normal pulses.     Heart sounds: Normal heart sounds.  Pulmonary:     Effort: Pulmonary effort is normal.     Breath sounds: Normal breath sounds.  Abdominal:     General: Abdomen is flat. Bowel sounds are normal. There is no distension.     Palpations: Abdomen is soft.     Tenderness: There is no abdominal tenderness.  Musculoskeletal:        General: No swelling.     Cervical back: Neck supple.  Lymphadenopathy:     Cervical: No cervical adenopathy.  Skin:    General: Skin is warm and dry.     Findings: No rash.  Neurological:     General: No focal deficit present.     Mental Status: She is alert.  Psychiatric:        Mood and Affect: Mood normal.        Behavior: Behavior normal.     LABORATORY DATA:  CBC    Component Value Date/Time   WBC  2.7 (L) 02/12/2023 1010   RBC 3.65 (L) 02/12/2023 1010   HGB 11.7 (L) 02/12/2023 1010   HGB 11.5 (L) 02/06/2023 1500   HCT 35.2 (L) 02/12/2023 1010   HCT 39.0 11/24/2022 1035   PLT 94 (L) 02/12/2023 1010   PLT 35 (L) 02/06/2023 1500   MCV 96.4 02/12/2023 1010   MCH 32.1 02/12/2023 1010   MCHC 33.2 02/12/2023 1010   RDW 18.8 (H) 02/12/2023 1010   LYMPHSABS 1.4 02/12/2023 1010   MONOABS 0.1 02/12/2023 1010   EOSABS 0.0 02/12/2023 1010   BASOSABS 0.0 02/12/2023 1010    CMP     Component Value Date/Time   NA 136 02/04/2023 1328   K 3.7 02/04/2023 1328   CL 103 02/04/2023 1328   CO2 24 02/04/2023 1328   GLUCOSE 95 02/04/2023 1328   BUN 14 02/04/2023 1328   CREATININE 0.43 (L) 02/04/2023 1328   CALCIUM 8.9 02/04/2023 1328   GFRNONAA >60 02/04/2023 1328        ASSESSMENT and THERAPY PLAN:   MDS (myelodysplastic syndrome), high grade (HCC) 01/07/2023: Bone marrow aspirate: Hypercellular bone marrow 50% cellularity with dysplasia, flow cytometry: 5% CD34 positive blasts, cytogenetics: Normal  She was treated with azacitidine but unfortunately 3 days into treatment developed a rash likely secondary to azacitidine.  Though her rash is improving I reviewed her recovery with Dr. Al Pimple who prefers to proceed with decitabine moving forward.  I reviewed the recommendation for decitabine moving forward with a tentative start date of March 04, 2023.  Jaiana verbalized understanding.  I gave her detailed information about decitabine and her after visit summary.  I refilled Sumeya's hydroxyzine today.  She will undergo port placement on September 4 and she will come back and see Dr. Al Pimple next week.  We are still waiting on the [facility] panel for MDS from pathology.  I asked my nurse to call and follow-up on this today.  RTC in 1 week for follow-up with Dr. Al Pimple.   All questions were answered. The patient knows to call the clinic with  any problems, questions or concerns. We can certainly  see the patient much sooner if necessary.  Total encounter time:30 minutes*in face-to-face visit time, chart review, lab review, care coordination, order entry, and documentation of the encounter time.    Lillard Anes, NP 02/12/23 12:08 PM Medical Oncology and Hematology [facility] [address] [place], [place] [address] Tel. [phone]    Fax. [phone]  *Total Encounter Time as defined by the Centers for Medicare and Medicaid Services includes, in addition to the face-to-face time of a patient visit (documented in the note above) non-face-to-face time: obtaining and reviewing outside history, ordering and reviewing medications, tests or procedures, care coordination (communications with other health care professionals or caregivers) and documentation in the medical record.

## 2023-02-12 NOTE — Assessment & Plan Note (Signed)
01/07/2023: Bone marrow aspirate: Hypercellular bone marrow 50% cellularity with dysplasia, flow cytometry: 5% CD34 positive blasts, cytogenetics: Normal  She was treated with azacitidine but unfortunately 3 days into treatment developed a rash likely secondary to azacitidine.  Though her rash is improving I reviewed her recovery with Dr. Al Pimple who prefers to proceed with decitabine moving forward.  I reviewed the recommendation for decitabine moving forward with a tentative start date of March 04, 2023.  Kielynn verbalized understanding.  I gave her detailed information about decitabine and her after visit summary.  I refilled Tykeshia's hydroxyzine today.  She will undergo port placement on September 4 and she will come back and see Dr. Al Pimple next week.  We are still waiting on the [facility] panel for MDS from pathology.  I asked my nurse to call and follow-up on this today.  RTC in 1 week for follow-up with Dr. Al Pimple.

## 2023-02-12 NOTE — Patient Instructions (Signed)
Decitabine Injection What is this medication? DECITABINE (dee SYE ta been) treats blood and bone marrow cancers. It works by slowing down the growth of cancer cells. This medicine may be used for other purposes; ask your health care provider or pharmacist if you have questions. COMMON BRAND NAME(S): Dacogen What should I tell my care team before I take this medication? They need to know if you have any of these conditions: Infection Kidney disease Liver disease An unusual or allergic reaction to decitabine, other medications, foods, dyes, or preservatives Pregnant or trying to get pregnant Breast-feeding How should I use this medication? This medication is infused into a vein. It is given by your care team in a hospital or clinic setting. Talk to your care team about the use of this medication in children. Special care may be needed. Overdosage: If you think you have taken too much of this medicine contact a poison control center or emergency room at once. NOTE: This medicine is only for you. Do not share this medicine with others. What if I miss a dose? Keep appointments for follow-up doses. It is important not to miss your dose. Call your care team if you are unable to keep an appointment. What may interact with this medication? Interactions are not expected. This list may not describe all possible interactions. Give your health care provider a list of all the medicines, herbs, non-prescription drugs, or dietary supplements you use. Also tell them if you smoke, drink alcohol, or use illegal drugs. Some items may interact with your medicine. What should I watch for while using this medication? Visit your care team for regular checks on your progress. You may need blood work while taking this medication. This medication may make you feel generally unwell. This is not uncommon as chemotherapy can affect healthy cells as well as cancer cells. Report any side effects. Continue your course of  treatment even though you feel ill unless your care team tells you to stop. This medication may increase your risk of getting an infection. Call your care team for advice if you get a fever, chills, sore throat, or other symptoms of a cold or flu. Do not treat yourself. Try to avoid being around people who are sick. Be careful brushing or flossing your teeth or using a toothpick because you may get an infection or bleed more easily. If you have any dental work done, tell your dentist you are receiving this medication. Call your care team if you are around anyone with measles, chickenpox, or if you develop sores or blisters that do not heal properly. Avoid taking medications that contain aspirin, acetaminophen, ibuprofen, naproxen, or ketoprofen unless instructed by your care team. These medications may hide a fever. Talk to your care team if you or your partner wish to become pregnant or think either of you might be pregnant. This medication can cause serious birth defects if taken during pregnancy or for 6 months after the last dose. A negative pregnancy test is required before starting this medication. A reliable form of contraception is recommended while taking this medication and for 6 months after the last dose. Talk to your care team about reliable forms of contraception. Do not father a child while taking this medication or for 3 months after the last dose. Use a condom while having sex during this time period. Do not breast-feed while taking this medication and for 2 weeks after the last dose. This medication may cause infertility. Talk to your care team if  you are concerned about your fertility. What side effects may I notice from receiving this medication? Side effects that you should report to your care team as soon as possible: Allergic reactions--skin rash, itching, hives, swelling of the face, lips, tongue, or throat Infection--fever, chills, cough, sore throat, wounds that don't heal, pain  or trouble when passing urine, general feeling of discomfort or being unwell Low red blood cell level--unusual weakness or fatigue, dizziness, headache, trouble breathing Unusual bruising or bleeding Side effects that usually do not require medical attention (report to your care team if they continue or are bothersome): Constipation Diarrhea Fatigue Nausea Pain, redness, or swelling with sores inside the mouth or throat Stomach pain This list may not describe all possible side effects. Call your doctor for medical advice about side effects. You may report side effects to FDA at 1-800-FDA-1088. Where should I keep my medication? This medication is given in a hospital or clinic. It will not be stored at home. NOTE: This sheet is a summary. It may not cover all possible information. If you have questions about this medicine, talk to your doctor, pharmacist, or health care provider.  2024 Elsevier/Gold Standard (2021-09-13 00:00:00)

## 2023-02-13 ENCOUNTER — Other Ambulatory Visit (HOSPITAL_COMMUNITY): Payer: Medicare Other

## 2023-02-13 ENCOUNTER — Other Ambulatory Visit: Payer: Self-pay | Admitting: Hematology and Oncology

## 2023-02-13 ENCOUNTER — Ambulatory Visit (HOSPITAL_COMMUNITY): Payer: Medicare Other

## 2023-02-13 DIAGNOSIS — D46Z Other myelodysplastic syndromes: Secondary | ICD-10-CM

## 2023-02-15 ENCOUNTER — Encounter: Payer: Self-pay | Admitting: Adult Health

## 2023-02-19 ENCOUNTER — Other Ambulatory Visit: Payer: Self-pay | Admitting: Physician Assistant

## 2023-02-19 DIAGNOSIS — Z01818 Encounter for other preprocedural examination: Secondary | ICD-10-CM

## 2023-02-19 MED ORDER — DEXTROSE 5 % IV SOLN: 2.0000 g | Freq: Once | INTRAVENOUS | Status: DC

## 2023-02-19 NOTE — Addendum Note (Signed)
Addended by: Lynnette Caffey A on: 02/19/2023 03:38 PM   Modules accepted: Orders

## 2023-02-20 ENCOUNTER — Ambulatory Visit (HOSPITAL_COMMUNITY)
Admission: RE | Admit: 2023-02-20 | Discharge: 2023-02-20 | Disposition: A | Discharge status: Home / Self Care | Payer: Medicare Other | Source: Ambulatory Visit | Attending: Hematology and Oncology | Admitting: Hematology and Oncology

## 2023-02-20 ENCOUNTER — Other Ambulatory Visit (HOSPITAL_COMMUNITY): Payer: Medicare Other

## 2023-02-20 ENCOUNTER — Encounter (HOSPITAL_COMMUNITY): Payer: Self-pay

## 2023-02-20 ENCOUNTER — Other Ambulatory Visit: Payer: Self-pay | Admitting: Hematology and Oncology

## 2023-02-20 ENCOUNTER — Other Ambulatory Visit: Payer: Self-pay

## 2023-02-20 DIAGNOSIS — D46Z Other myelodysplastic syndromes: Secondary | ICD-10-CM | POA: Diagnosis not present

## 2023-02-20 DIAGNOSIS — Z01818 Encounter for other preprocedural examination: Secondary | ICD-10-CM

## 2023-02-20 DIAGNOSIS — I878 Other specified disorders of veins: Secondary | ICD-10-CM | POA: Insufficient documentation

## 2023-02-20 HISTORY — PX: IR IMAGING GUIDED PORT INSERTION: IMG5740

## 2023-02-20 MED ORDER — FENTANYL CITRATE (PF) 100 MCG/2ML IJ SOLN
INTRAMUSCULAR | Status: AC | PRN
  Administered 2023-02-20: 50 ug via INTRAVENOUS
  Administered 2023-02-20 (×2): 25 ug via INTRAVENOUS

## 2023-02-20 MED ORDER — LIDOCAINE-EPINEPHRINE 1 %-1:100000 IJ SOLN
20.0000 mL | Freq: Once | INTRAMUSCULAR | Status: AC
  Administered 2023-02-20: 20 mL via INTRADERMAL

## 2023-02-20 MED ORDER — HEPARIN SOD (PORK) LOCK FLUSH 100 UNIT/ML IV SOLN
INTRAVENOUS | Status: AC
  Filled 2023-02-20: qty 5

## 2023-02-20 MED ORDER — HEPARIN SOD (PORK) LOCK FLUSH 100 UNIT/ML IV SOLN
500.0000 [IU] | Freq: Once | INTRAVENOUS | Status: AC
  Administered 2023-02-20: 500 [IU] via INTRAVENOUS

## 2023-02-20 MED ORDER — SODIUM CHLORIDE 0.9 % IV SOLN: INTRAVENOUS | Status: DC

## 2023-02-20 MED ORDER — LIDOCAINE-EPINEPHRINE 1 %-1:100000 IJ SOLN
INTRAMUSCULAR | Status: AC
  Filled 2023-02-20: qty 1

## 2023-02-20 MED ORDER — MIDAZOLAM HCL 2 MG/2ML IJ SOLN
INTRAMUSCULAR | Status: AC | PRN
Start: 2023-02-20 — End: 2023-02-20
  Administered 2023-02-20: .5 mg via INTRAVENOUS
  Administered 2023-02-20: 1 mg via INTRAVENOUS
  Administered 2023-02-20: .5 mg via INTRAVENOUS

## 2023-02-20 MED ORDER — MIDAZOLAM HCL 2 MG/2ML IJ SOLN
INTRAMUSCULAR | Status: AC
  Filled 2023-02-20: qty 2

## 2023-02-20 MED ORDER — FENTANYL CITRATE (PF) 100 MCG/2ML IJ SOLN
INTRAMUSCULAR | Status: AC
  Filled 2023-02-20: qty 2

## 2023-02-20 NOTE — H&P (Signed)
Chief Complaint: Patient was seen in consultation today for Dr. Al Pimple   Referring Physician(s): Rachel Moulds  Supervising Physician: Ruel Favors  Patient Status: [facility] - Out-pt  History of Present Illness: [name] [name] is a 77 y.o. female with MDS receiving systemic therapy. She is referred for port placement.  PMHx, meds, labs, imaging, allergies reviewed. Feels well, no recent fevers, chills, illness. Has been NPO today as directed.   Past Medical History:  Diagnosis Date   Asthma    Diverticulitis    Hypothyroid    Psoriatic arthritis (HCC)     Past Surgical History:  Procedure Laterality Date   COLONOSCOPY  08/2021   HIP SURGERY Bilateral    TONSILLECTOMY     TUBAL LIGATION      Allergies: Latex, Cat hair extract, Dust mite extract, Molds & smuts, Other, and Pollen extract-tree extract [pollen extract]  Medications: Prior to Admission medications   Medication Sig Start Date End Date Taking? Authorizing Provider  albuterol (VENTOLIN HFA) 108 (90 Base) MCG/ACT inhaler Inhale 2 puffs into the lungs every 6 (six) hours as needed for wheezing or shortness of breath. 12/02/20   Charlott Holler, MD  Azelastine-Fluticasone [phone] MCG/ACT SUSP Place 1 spray into the nose 2 (two) times daily as needed. 02/15/22   Alfonse Spruce, MD  b complex vitamins tablet Take 1 tablet by mouth daily.    [provider]  budesonide-formoterol (SYMBICORT) 80-4.5 MCG/ACT inhaler Inhale 2 puffs into the lungs in the morning and at bedtime. 10/26/21   Alfonse Spruce, MD  Cholecalciferol (VITAMIN D3) 25 MCG (1000 UT) CAPS Take by mouth. 04/24/11   [provider]  EPINEPHrine 0.3 mg/0.3 mL IJ SOAJ injection Inject 0.3 mg into the muscle as needed for anaphylaxis. 11/15/22   Alfonse Spruce, MD  famotidine (PEPCID) 40 MG tablet Take 1 tablet (40 mg total) by mouth daily. 02/02/22   Unk Lightning, PA  gabapentin (NEURONTIN) 800 MG tablet 1  tablet    [provider]  Golimumab (SIMPONI ARIA IV) Inject 206.4 mg into the vein as directed. Once every 8 weeks    [provider]  hydrOXYzine (VISTARIL) 25 MG capsule Take 1 capsule (25 mg total) by mouth 2 (two) times daily as needed. 02/12/23   Loa Socks, NP  influenza vac split quadrivalent PF (FLUARIX) 0.5 ML injection ADM 0.5ML IM UTD    [provider]  ketoconazole (NIZORAL) 2 % cream Apply to bottoms of feet for 2-3 weeks 03/02/21   Delories Heinz, DPM  levothyroxine (SYNTHROID) 100 MCG tablet Take 100 mcg by mouth every morning. 02/20/21   [provider]  Magnesium 250 MG TABS Take 250 mg by mouth daily.    [provider]  montelukast (SINGULAIR) 10 MG tablet TAKE 1 TABLET(10 MG) BY MOUTH AT BEDTIME 02/05/23   Alfonse Spruce, MD  neomycin-polymyxin b-dexamethasone (MAXITROL) 3.5-10000-0.1 OINT SMARTSIG:sparingly Left Eye Twice Daily 12/28/21   [provider]  pantoprazole (PROTONIX) 40 MG tablet TAKE 1 TABLET(40 MG) BY MOUTH TWICE DAILY BEFORE A MEAL 09/11/21   Tressia Danas, MD  predniSONE (DELTASONE) 20 MG tablet Take 1 tablet (20 mg total) by mouth daily with breakfast. 02/08/23   Loa Socks, NP  traMADol Janean Sark) 50 MG tablet  01/26/20   [provider]     Family History  Problem Relation Age of Onset   Asthma Mother    Allergic rhinitis Mother    Rheum arthritis  Mother    Allergic rhinitis Father    Alzheimer's disease Father    Eczema Brother    Allergic rhinitis Brother    Colon cancer Neg Hx    Esophageal cancer Neg Hx    Stomach cancer Neg Hx    Rectal cancer Neg Hx     Social History   Socioeconomic History   Marital status: Widowed    Spouse name: Not on file   Number of children: Not on file   Years of education: Not on file   Highest education level: Not on file  Occupational History   Not on file  Tobacco Use   Smoking status: Former    Current  packs/day: 0.00    Average packs/day: 0.1 packs/day for 33.4 years (3.3 ttl pk-yrs)    Types: Cigarettes    Start date: 59    Quit date: 11/18/2000    Years since quitting: 22.2   Smokeless tobacco: Never   Tobacco comments:    3 cigarettes a day  Vaping Use   Vaping status: Never Used  Substance and Sexual Activity   Alcohol use: Never   Drug use: Never   Sexual activity: Not Currently  Other Topics Concern   Not on file  Social History Narrative   Lives with children   Social Determinants of Health   Financial Resource Strain: Not on file  Food Insecurity: No Food Insecurity (11/24/2022)   Hunger Vital Sign    Worried About Running Out of Food in the Last Year: Never true    Ran Out of Food in the Last Year: Never true  Transportation Needs: No Transportation Needs (11/24/2022)   PRAPARE - Administrator, Civil Service (Medical): No    Lack of Transportation (Non-Medical): No  Physical Activity: Not on file  Stress: Not on file  Social Connections: Not on file    Review of Systems: A 12 point ROS discussed and pertinent positives are indicated in the HPI above.  All other systems are negative.  Review of Systems  Vital Signs: There were no vitals taken for this visit.  Physical Exam Constitutional:      Appearance: She is not ill-appearing.  HENT:     Mouth/Throat:     Mouth: Mucous membranes are moist.     Pharynx: Oropharynx is clear.  Cardiovascular:     Rate and Rhythm: Normal rate and regular rhythm.     Heart sounds: Normal heart sounds.  Pulmonary:     Effort: Pulmonary effort is normal. No respiratory distress.     Breath sounds: Normal breath sounds.  Skin:    General: Skin is warm and dry.  Neurological:     General: No focal deficit present.     Mental Status: She is alert and oriented to person, place, and time.  Psychiatric:        Mood and Affect: Mood normal.        Thought Content: Thought content normal.        Judgment:  Judgment normal.     Imaging: No results found.  Labs:  CBC: Recent Labs    01/07/23 0907 02/04/23 1328 02/06/23 1500 02/12/23 1010  WBC 2.4* 2.5* 2.3* 2.7*  HGB 11.1* 11.8* 11.5* 11.7*  HCT 34.6* 35.1* 35.7* 35.2*  PLT 61* 47* 35* 94*    COAGS: Recent Labs    11/24/22 1036  INR 1.3*  APTT 29    BMP: Recent Labs    11/03/22 2326 02/04/23  1328  NA 139 136  K 3.7 3.7  CL 108 103  CO2 23 24  GLUCOSE 97 95  BUN 12 14  CALCIUM 8.8* 8.9  CREATININE 0.51 0.43*  GFRNONAA >60 >60    LIVER FUNCTION TESTS: No results for input(s): "BILITOT", "AST", "ALT", "ALKPHOS", "PROT", "ALBUMIN" in the last 8760 hours.   Assessment and Plan: Myelodysplastic syndrome For port placement Risks and benefits of image guided port-a-catheter placement was discussed with the patient including, but not limited to bleeding, infection, pneumothorax, or fibrin sheath development and need for additional procedures.  All of the patient's questions were answered, patient is agreeable to proceed. Consent signed and in chart.    Electronically Signed: Brayton El, PA-C 02/20/2023, 7:38 AM   I spent a total of 20 minutes in face to face in clinical consultation, greater than 50% of which was counseling/coordinating care for port

## 2023-02-20 NOTE — Discharge Instructions (Addendum)
Please call Interventional Radiology clinic [phone] with any questions or concerns.  You may remove your dressing and shower tomorrow.  After the procedure, it is common to have: Discomfort at the port insertion site. Bruising on the skin over the port. This should improve over 3-4 days  Follow these instructions at home:  Medication: Do not use Aspirin or ibuprofen products, such as Advil or Motrin, as it may increase bleeding.  You may resume your usual medications as ordered by your doctor. If your doctor prescribed antibiotics, take them as directed. Do not stop taking them just because you feel better. You need to take the full course of antibiotics.  Eating and drinking: Drink plenty of liquids to keep your urine pale yellow You can resume your regular diet as directed by your doctor   Care of the procedure site Follow instructions from your health care provider about how to take care of your port insertion site. Make sure you: After your port is placed, you will get a manufacturer's information card. The card has information about your port. Keep this card with you at all times Make sure to remember what type of port you have Take care of the port as told by your health care provider DO NOT use EMLA cream for 2 weeks after port placement -the cream will remove surgical glue on your incision Wash your hands with soap and water before and after you change your bandage (dressing). If soap and water are not available, use hand sanitizer Change your dressing as told by your health care provider Leave skin glue, or adhesive strips in place. These skin closures may need to stay in place for 2 weeks or longer Check your port insertion site every day for signs of infection. Check for: Redness, swelling, or pain Fluid or blood Warmth Pus or a bad smell  Activity Return to your normal activities as told by your health care provider. Ask your health care provider what activities  are safe for you Do not lift anything that is heavier than 10 lb (4.5 kg), or the limit that you are told, until your health care provider says that it is safe Do not take baths, swim, or use a hot tub until your health care provider approves. Take showers only. Keep all follow-up visits as told by your doctor  Contact a health care provider if: You cannot flush your port with saline as directed, or you cannot draw blood from the port You have a fever or chills You have redness, swelling, or pain around your port insertion site You have fluid or blood coming from your port insertion site Your port insertion site feels warm to the touch You have pus or a bad smell coming from the port insertion site  Get help right away if: You have chest pain or shortness of breath You have bleeding from your port that you cannot control

## 2023-02-20 NOTE — Procedures (Signed)
Interventional Radiology Procedure Note  Procedure: Placement of a right IJ approach single lumen PowerPort.  Tip is positioned at the superior cavoatrial junction and catheter is ready for immediate use.  Complications: None Recommendations:  - Ok to shower tomorrow - Do not submerge for 7 days - Routine line care   Signed,  Jaime S. Wagner, DO   

## 2023-02-21 ENCOUNTER — Encounter (HOSPITAL_COMMUNITY): Payer: Self-pay

## 2023-02-21 ENCOUNTER — Inpatient Hospital Stay (HOSPITAL_BASED_OUTPATIENT_CLINIC_OR_DEPARTMENT_OTHER): Payer: Medicare Other | Admitting: Hematology and Oncology

## 2023-02-21 ENCOUNTER — Inpatient Hospital Stay: Payer: Medicare Other | Attending: Hematology and Oncology

## 2023-02-21 VITALS — BP 108/61 | HR 92 | Temp 98.7°F | Resp 19 | Wt 188.9 lb

## 2023-02-21 DIAGNOSIS — D696 Thrombocytopenia, unspecified: Secondary | ICD-10-CM | POA: Diagnosis present

## 2023-02-21 DIAGNOSIS — Z9089 Acquired absence of other organs: Secondary | ICD-10-CM | POA: Insufficient documentation

## 2023-02-21 DIAGNOSIS — Z5111 Encounter for antineoplastic chemotherapy: Secondary | ICD-10-CM | POA: Diagnosis present

## 2023-02-21 DIAGNOSIS — I10 Essential (primary) hypertension: Secondary | ICD-10-CM | POA: Insufficient documentation

## 2023-02-21 DIAGNOSIS — Z79899 Other long term (current) drug therapy: Secondary | ICD-10-CM | POA: Diagnosis not present

## 2023-02-21 DIAGNOSIS — Z87891 Personal history of nicotine dependence: Secondary | ICD-10-CM | POA: Diagnosis not present

## 2023-02-21 DIAGNOSIS — Z818 Family history of other mental and behavioral disorders: Secondary | ICD-10-CM | POA: Diagnosis not present

## 2023-02-21 DIAGNOSIS — Z825 Family history of asthma and other chronic lower respiratory diseases: Secondary | ICD-10-CM | POA: Diagnosis not present

## 2023-02-21 DIAGNOSIS — D469 Myelodysplastic syndrome, unspecified: Secondary | ICD-10-CM | POA: Insufficient documentation

## 2023-02-21 DIAGNOSIS — D46Z Other myelodysplastic syndromes: Secondary | ICD-10-CM | POA: Diagnosis not present

## 2023-02-21 DIAGNOSIS — R21 Rash and other nonspecific skin eruption: Secondary | ICD-10-CM | POA: Insufficient documentation

## 2023-02-21 DIAGNOSIS — Z8261 Family history of arthritis: Secondary | ICD-10-CM | POA: Diagnosis not present

## 2023-02-21 LAB — CBC WITH DIFFERENTIAL/PLATELET
Abs Immature Granulocytes: 0 10*3/uL (ref 0.00–0.07)
Basophils Absolute: 0 10*3/uL (ref 0.0–0.1)
Basophils Relative: 0 %
Eosinophils Absolute: 0 10*3/uL (ref 0.0–0.5)
Eosinophils Relative: 0 %
HCT: 36.9 % (ref 36.0–46.0)
Hemoglobin: 11.8 g/dL — ABNORMAL LOW (ref 12.0–15.0)
Immature Granulocytes: 0 %
Lymphocytes Relative: 44 %
Lymphs Abs: 1.2 10*3/uL (ref 0.7–4.0)
MCH: 31.5 pg (ref 26.0–34.0)
MCHC: 32 g/dL (ref 30.0–36.0)
MCV: 98.4 fL (ref 80.0–100.0)
Monocytes Absolute: 0.3 10*3/uL (ref 0.1–1.0)
Monocytes Relative: 12 %
Neutro Abs: 1.2 10*3/uL — ABNORMAL LOW (ref 1.7–7.7)
Neutrophils Relative %: 44 %
Platelets: 79 10*3/uL — ABNORMAL LOW (ref 150–400)
RBC Morphology: 2
RBC: 3.75 MIL/uL — ABNORMAL LOW (ref 3.87–5.11)
RDW: 20.1 % — ABNORMAL HIGH (ref 11.5–15.5)
Smear Review: NORMAL
WBC: 2.8 10*3/uL — ABNORMAL LOW (ref 4.0–10.5)
nRBC: 0 % (ref 0.0–0.2)

## 2023-02-21 NOTE — Progress Notes (Signed)
[facility] Cancer Follow up:    [name], [name], MD [address] [place] [address]   DIAGNOSIS: Thrombocytopenia  SUMMARY OF ONCOLOGIC HISTORY: Evaluated by Dr. Al Pimple on 11/24/2022, blood counts showed persistent thrombocytopenia, macrocytosis, normal b12 level, and normal iron studies.   Bone marrow biopsy completed on 7/22/2024cellular  bone marrow with focal areas of hypercellularity (approximately up to  50%).  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  Blasts appear  mildly mildly increased, approximately 5% to focally up to 10%.  Flow  cytometric analysis reveals 5% CD34 positive blasts.  While the findings could be attributable to the patient's coexisting conditions, the possibility of a myeloid neoplasm such as myelodysplastic neoplasm with increased blasts cannot be entirely excluded. , normal karyotype  CURRENT THERAPY:  INTERVAL HISTORY:  [name] [name] 77 y.o. female returns for follow up. Since last visit, she was seen by Dr Pamelia Hoit and his recommendation was to consider azacytidine. She is here for an interim visit with skin rash.  Skin rash is diffuse all over the trunk, and the armpits, and the groin.  It has gotten worse since yesterday.  Initially she wondered if it was related to an insect bite but now she is worried if it is related to the drug.  The rash is very itchy.  Other than that she denies any fevers or chills.  No rash inside her mouth.  She is able to eat and swallow well.  Rest of the pertinent 10 point ROS reviewed and negative   Patient Active Problem List   Diagnosis Date Noted   MDS (myelodysplastic syndrome), high grade (HCC) 01/29/2023   Obesity 03/02/2021   Other long term (current) drug therapy 03/02/2021   Primary osteoarthritis 03/02/2021   SOB (shortness of breath) 12/08/2020   Pericardial effusion 12/08/2020   Dry eyes/dry mouth 05/25/2019   Bronchitis, mucopurulent recurrent (HCC) 02/26/2019   Moderate  persistent asthma without complication 02/24/2019   Perennial and seasonal allergic rhinitis 02/24/2019   Seasonal and perennial allergic rhinoconjunctivitis 02/24/2019   Primary osteoarthritis of both knees 04/10/2016   Acute chest pain 08/19/2014   Costochondritis 08/19/2014   Lung nodule 08/19/2014   HTN (hypertension) 08/07/2012   HLD (hyperlipidemia) 09/26/2011   Asthma 09/06/2010   Depression 09/06/2010   History of allergy 09/06/2010   Hypothyroidism 09/06/2010   Lower back pain 09/06/2010   Arthritis 09/06/2010   Pain in joint, pelvic region and thigh 09/06/2010   Peripheral neuropathy 09/06/2010   Psoriasis 09/06/2010   Therapeutic drug monitoring 09/06/2010   Tinnitus 09/06/2010   Vitamin B 12 deficiency 09/06/2010   Cervical spondylosis without myelopathy 04/24/2006   Tear of lateral cartilage or meniscus of knee, current 03/13/2006    is allergic to latex, cat hair extract, dust mite extract, molds & smuts, other, and pollen extract-tree extract [pollen extract].  MEDICAL HISTORY: Past Medical History:  Diagnosis Date   Asthma    Diverticulitis    Hypothyroid    Psoriatic arthritis (HCC)     SURGICAL HISTORY: Past Surgical History:  Procedure Laterality Date   COLONOSCOPY  08/2021   HIP SURGERY Bilateral    IR IMAGING GUIDED PORT INSERTION  02/20/2023   TONSILLECTOMY     TUBAL LIGATION      SOCIAL HISTORY: Social History   Socioeconomic History   Marital status: Widowed    Spouse name: Not on file   Number of children: Not on file   Years of education: Not  on file   Highest education level: Not on file  Occupational History   Not on file  Tobacco Use   Smoking status: Former    Current packs/day: 0.00    Average packs/day: 0.1 packs/day for 33.4 years (3.3 ttl pk-yrs)    Types: Cigarettes    Start date: 74    Quit date: 11/18/2000    Years since quitting: 22.2   Smokeless tobacco: Never   Tobacco comments:    3 cigarettes a day  Vaping Use    Vaping status: Never Used  Substance and Sexual Activity   Alcohol use: Never   Drug use: Never   Sexual activity: Not Currently  Other Topics Concern   Not on file  Social History Narrative   Lives with children   Social Determinants of Health   Financial Resource Strain: Not on file  Food Insecurity: No Food Insecurity (11/24/2022)   Hunger Vital Sign    Worried About Running Out of Food in the Last Year: Never true    Ran Out of Food in the Last Year: Never true  Transportation Needs: No Transportation Needs (11/24/2022)   PRAPARE - Administrator, Civil Service (Medical): No    Lack of Transportation (Non-Medical): No  Physical Activity: Not on file  Stress: Not on file  Social Connections: Not on file  Intimate Partner Violence: Not At Risk (11/24/2022)   Humiliation, Afraid, Rape, and Kick questionnaire    Fear of Current or Ex-Partner: No    Emotionally Abused: No    Physically Abused: No    Sexually Abused: No    FAMILY HISTORY: Family History  Problem Relation Age of Onset   Asthma Mother    Allergic rhinitis Mother    Rheum arthritis Mother    Allergic rhinitis Father    Alzheimer's disease Father    Eczema Brother    Allergic rhinitis Brother    Colon cancer Neg Hx    Esophageal cancer Neg Hx    Stomach cancer Neg Hx    Rectal cancer Neg Hx      PHYSICAL EXAMINATION     Vitals:   02/21/23 1520  BP: 108/61  Pulse: 92  Resp: 19  Temp: 98.7 F (37.1 C)  SpO2: 97%   General Appearance: Alert, oriented and in no acute distress. Skin: Maculopapular rash on the trunk as well as the chest wall, extending to the groin, buttocks.  No oral lesions.  No pustular lesions.  LABORATORY DATA:  CBC    Component Value Date/Time   WBC 2.8 (L) 02/21/2023 1504   RBC 3.75 (L) 02/21/2023 1504   HGB 11.8 (L) 02/21/2023 1504   HGB 11.5 (L) 02/06/2023 1500   HCT 36.9 02/21/2023 1504   HCT 39.0 11/24/2022 1035   PLT 79 (L) 02/21/2023 1504   PLT 35  (L) 02/06/2023 1500   MCV 98.4 02/21/2023 1504   MCH 31.5 02/21/2023 1504   MCHC 32.0 02/21/2023 1504   RDW 20.1 (H) 02/21/2023 1504   LYMPHSABS PENDING 02/21/2023 1504   MONOABS PENDING 02/21/2023 1504   EOSABS PENDING 02/21/2023 1504   BASOSABS PENDING 02/21/2023 1504    CMP     Component Value Date/Time   NA 136 02/04/2023 1328   K 3.7 02/04/2023 1328   CL 103 02/04/2023 1328   CO2 24 02/04/2023 1328   GLUCOSE 95 02/04/2023 1328   BUN 14 02/04/2023 1328   CREATININE 0.43 (L) 02/04/2023 1328   CALCIUM 8.9 02/04/2023  1328   GFRNONAA >60 02/04/2023 1328    ASSESSMENT and THERAPY PLAN:   No problem-specific Assessment & Plan notes found for this encounter.     All questions were answered. The patient knows to call the clinic with any problems, questions or concerns. We can certainly see the patient much sooner if necessary. This note was electronically signed.  Rachel Moulds, MD 02/21/2023

## 2023-02-21 NOTE — Progress Notes (Signed)
[facility] Cancer Follow up:    [name], [name], MD [address] [place] [address]   DIAGNOSIS: Myelodysplastic Syndrome  SUMMARY OF ONCOLOGIC HISTORY: Oncology History  MDS (myelodysplastic syndrome), high grade (HCC)  01/07/2023 Initial Biopsy   Bone marrow biopsy:cellular bone marrow with focal areas of hypercellularity (approximately up to  50%).  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  Blasts appear mildly mildly increased, approximately 5% to focally up to 10%.  Flow cytometric analysis reveals 5% CD34 positive blasts.  While the findings could be attributable to the patient's coexisting conditions, the possibility of a myeloid neoplasm such as myelodysplastic neoplasm with increased blasts cannot be entirely excluded. normal karyotype   01/29/2023 Initial Diagnosis   MDS (myelodysplastic syndrome), high grade (HCC)   02/04/2023 - 02/06/2023 Chemotherapy   Patient is on Treatment Plan : MYELODYSPLASIA  Azacitidine IV D1-5 q28d     03/06/2023 -  Chemotherapy   Patient is on Treatment Plan : MYELODYSPLASIA Decitabine D1-5 q28d       CURRENT THERAPY: Azacitidine   INTERVAL HISTORY:  [name] [name] 77 y.o. female returns for follow-up after receiving azacitidine.  Unfortunately 2 to 3 days into the azacitidine she developed a rash and azacitidine was held.  She was administered Solu-Medrol and started on a steroid taper.she completed steroids about 3 days ago and she feels much better.  The rash has finally turned the corner she says.  She has not noticed any significant improvement in the fatigue but may have felt slightly better.  She denies any bleeding issues.  Rest of the pertinent 10 point ROS reviewed and negative   Patient Active Problem List   Diagnosis Date Noted   MDS (myelodysplastic syndrome), high grade (HCC) 01/29/2023   Obesity 03/02/2021   Other long term (current) drug therapy 03/02/2021   Primary osteoarthritis 03/02/2021    SOB (shortness of breath) 12/08/2020   Pericardial effusion 12/08/2020   Dry eyes/dry mouth 05/25/2019   Bronchitis, mucopurulent recurrent (HCC) 02/26/2019   Moderate persistent asthma without complication 02/24/2019   Perennial and seasonal allergic rhinitis 02/24/2019   Seasonal and perennial allergic rhinoconjunctivitis 02/24/2019   Primary osteoarthritis of both knees 04/10/2016   Acute chest pain 08/19/2014   Costochondritis 08/19/2014   Lung nodule 08/19/2014   HTN (hypertension) 08/07/2012   HLD (hyperlipidemia) 09/26/2011   Asthma 09/06/2010   Depression 09/06/2010   History of allergy 09/06/2010   Hypothyroidism 09/06/2010   Lower back pain 09/06/2010   Arthritis 09/06/2010   Pain in joint, pelvic region and thigh 09/06/2010   Peripheral neuropathy 09/06/2010   Psoriasis 09/06/2010   Therapeutic drug monitoring 09/06/2010   Tinnitus 09/06/2010   Vitamin B 12 deficiency 09/06/2010   Cervical spondylosis without myelopathy 04/24/2006   Tear of lateral cartilage or meniscus of knee, current 03/13/2006    is allergic to latex, cat hair extract, dust mite extract, molds & smuts, other, and pollen extract-tree extract [pollen extract].  MEDICAL HISTORY: Past Medical History:  Diagnosis Date   Asthma    Diverticulitis    Hypothyroid    Psoriatic arthritis (HCC)     SURGICAL HISTORY: Past Surgical History:  Procedure Laterality Date   COLONOSCOPY  08/2021   HIP SURGERY Bilateral    IR IMAGING GUIDED PORT INSERTION  02/20/2023   TONSILLECTOMY     TUBAL LIGATION      SOCIAL HISTORY: Social History   Socioeconomic History   Marital status: Widowed  Spouse name: Not on file   Number of children: Not on file   Years of education: Not on file   Highest education level: Not on file  Occupational History   Not on file  Tobacco Use   Smoking status: Former    Current packs/day: 0.00    Average packs/day: 0.1 packs/day for 33.4 years (3.3 ttl pk-yrs)     Types: Cigarettes    Start date: 38    Quit date: 11/18/2000    Years since quitting: 22.2   Smokeless tobacco: Never   Tobacco comments:    3 cigarettes a day  Vaping Use   Vaping status: Never Used  Substance and Sexual Activity   Alcohol use: Never   Drug use: Never   Sexual activity: Not Currently  Other Topics Concern   Not on file  Social History Narrative   Lives with children   Social Determinants of Health   Financial Resource Strain: Not on file  Food Insecurity: No Food Insecurity (11/24/2022)   Hunger Vital Sign    Worried About Running Out of Food in the Last Year: Never true    Ran Out of Food in the Last Year: Never true  Transportation Needs: No Transportation Needs (11/24/2022)   PRAPARE - Administrator, Civil Service (Medical): No    Lack of Transportation (Non-Medical): No  Physical Activity: Not on file  Stress: Not on file  Social Connections: Not on file  Intimate Partner Violence: Not At Risk (11/24/2022)   Humiliation, Afraid, Rape, and Kick questionnaire    Fear of Current or Ex-Partner: No    Emotionally Abused: No    Physically Abused: No    Sexually Abused: No    FAMILY HISTORY: Family History  Problem Relation Age of Onset   Asthma Mother    Allergic rhinitis Mother    Rheum arthritis Mother    Allergic rhinitis Father    Alzheimer's disease Father    Eczema Brother    Allergic rhinitis Brother    Colon cancer Neg Hx    Esophageal cancer Neg Hx    Stomach cancer Neg Hx    Rectal cancer Neg Hx     Review of Systems  Constitutional:  Negative for appetite change, chills, fatigue, fever and unexpected weight change.  HENT:   Negative for hearing loss, lump/mass and trouble swallowing.   Eyes:  Negative for eye problems and icterus.  Respiratory:  Negative for chest tightness, cough and shortness of breath.   Cardiovascular:  Negative for chest pain, leg swelling and palpitations.  Gastrointestinal:  Negative for abdominal  distention, abdominal pain, constipation, diarrhea, nausea and vomiting.  Endocrine: Negative for hot flashes.  Genitourinary:  Negative for difficulty urinating.   Musculoskeletal:  Negative for arthralgias.  Skin:  Negative for itching and rash.  Neurological:  Negative for dizziness, extremity weakness, headaches and numbness.  Hematological:  Negative for adenopathy. Does not bruise/bleed easily.  Psychiatric/Behavioral:  Negative for depression. The patient is not nervous/anxious.       PHYSICAL EXAMINATION     Vitals:   02/21/23 1520  BP: 108/61  Pulse: 92  Resp: 19  Temp: 98.7 F (37.1 C)  SpO2: 97%    Physical Exam Constitutional:      General: She is not in acute distress.    Appearance: Normal appearance. She is not toxic-appearing.  HENT:     Head: Normocephalic and atraumatic.  Eyes:     General: No scleral icterus.  Musculoskeletal:        General: No swelling.     Cervical back: Neck supple.  Skin:    General: Skin is warm and dry.     Findings: No rash (Rash improving significantly).  Neurological:     General: No focal deficit present.     Mental Status: She is alert.  Psychiatric:        Mood and Affect: Mood normal.        Behavior: Behavior normal.     LABORATORY DATA:  CBC    Component Value Date/Time   WBC 2.8 (L) 02/21/2023 1504   RBC 3.75 (L) 02/21/2023 1504   HGB 11.8 (L) 02/21/2023 1504   HGB 11.5 (L) 02/06/2023 1500   HCT 36.9 02/21/2023 1504   HCT 39.0 11/24/2022 1035   PLT 79 (L) 02/21/2023 1504   PLT 35 (L) 02/06/2023 1500   MCV 98.4 02/21/2023 1504   MCH 31.5 02/21/2023 1504   MCHC 32.0 02/21/2023 1504   RDW 20.1 (H) 02/21/2023 1504   LYMPHSABS 1.2 02/21/2023 1504   MONOABS 0.3 02/21/2023 1504   EOSABS 0.0 02/21/2023 1504   BASOSABS 0.0 02/21/2023 1504    CMP     Component Value Date/Time   NA 136 02/04/2023 1328   K 3.7 02/04/2023 1328   CL 103 02/04/2023 1328   CO2 24 02/04/2023 1328   GLUCOSE 95 02/04/2023  1328   BUN 14 02/04/2023 1328   CREATININE 0.43 (L) 02/04/2023 1328   CALCIUM 8.9 02/04/2023 1328   GFRNONAA >60 02/04/2023 1328        ASSESSMENT and THERAPY PLAN:   MDS (myelodysplastic syndrome), high grade (HCC) This is a very pleasant 77 year old female patient with MDS, ASXL1 mutation, CBL, 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  4 points - IPSS-R Score Intermediate risk Median survival - 3 yrs Median time to 25% AML evolution: 3.2 years  01/07/2023: Bone marrow aspirate: Hypercellular bone marrow 50% cellularity with dysplasia, flow cytometry: 5% CD34 positive blasts, cytogenetics: Normal  She was treated with azacitidine but unfortunately 3 days into treatment developed a rash likely secondary to azacitidine.  She will be moving forward with decitabine for her next cycle which is anticipated in the next couple weeks.  She has already shown significant improvement in thrombocytopenia, fatigue also might have gotten better.  She completed her prednisone and her rash has also significantly improved.  She is happy to proceed with decitabine as scheduled.  I have canceled her next week labs and appointments and she is feeling well and her platelets have significantly improved.  RTC as scheduled.      All questions were answered. The patient knows to call the clinic with any problems, questions or concerns. We can certainly see the patient much sooner if necessary.  Total encounter time:30 minutes*in face-to-face visit time, chart review, lab review, care coordination, order entry, and documentation of the encounter time.    *Total Encounter Time as defined by the Centers for Medicare and Medicaid Services includes, in addition to the face-to-face time of a patient visit (documented in the note above) non-face-to-face time: obtaining and reviewing outside history, ordering and reviewing medications, tests or procedures, care coordination  (communications with other health care professionals or caregivers) and documentation in the medical record.

## 2023-02-22 ENCOUNTER — Encounter: Payer: Self-pay | Admitting: Hematology and Oncology

## 2023-02-22 NOTE — Assessment & Plan Note (Signed)
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  4 points - IPSS-R Score Intermediate risk Median survival - 3 yrs Median time to 25% AML evolution: 3.2 years  01/07/2023: Bone marrow aspirate: Hypercellular bone marrow 50% cellularity with dysplasia, flow cytometry: 5% CD34 positive blasts, cytogenetics: Normal  She was treated with azacitidine but unfortunately 3 days into treatment developed a rash likely secondary to azacitidine.  She will be moving forward with decitabine for her next cycle which is anticipated in the next couple weeks.  She has already shown significant improvement in thrombocytopenia, fatigue also might have gotten better.  She completed her prednisone and her rash has also significantly improved.  She is happy to proceed with decitabine as scheduled.  I have canceled her next week labs and appointments and she is feeling well and her platelets have significantly improved.  RTC as scheduled.

## 2023-02-26 ENCOUNTER — Encounter (HOSPITAL_COMMUNITY): Payer: Self-pay | Admitting: Hematology and Oncology

## 2023-02-26 ENCOUNTER — Inpatient Hospital Stay: Payer: Medicare Other | Admitting: Adult Health

## 2023-02-26 ENCOUNTER — Inpatient Hospital Stay: Payer: Medicare Other

## 2023-03-04 ENCOUNTER — Inpatient Hospital Stay: Payer: Medicare Other

## 2023-03-04 ENCOUNTER — Inpatient Hospital Stay (HOSPITAL_BASED_OUTPATIENT_CLINIC_OR_DEPARTMENT_OTHER): Payer: Medicare Other | Admitting: Hematology and Oncology

## 2023-03-04 ENCOUNTER — Other Ambulatory Visit: Payer: Self-pay

## 2023-03-04 VITALS — BP 121/47 | HR 72 | Temp 98.3°F | Resp 16 | Ht 68.0 in | Wt 186.6 lb

## 2023-03-04 DIAGNOSIS — R21 Rash and other nonspecific skin eruption: Secondary | ICD-10-CM

## 2023-03-04 DIAGNOSIS — D696 Thrombocytopenia, unspecified: Secondary | ICD-10-CM

## 2023-03-04 DIAGNOSIS — D46Z Other myelodysplastic syndromes: Secondary | ICD-10-CM

## 2023-03-04 DIAGNOSIS — Z5111 Encounter for antineoplastic chemotherapy: Secondary | ICD-10-CM | POA: Diagnosis not present

## 2023-03-04 LAB — CMP (CANCER CENTER ONLY)
ALT: 9 U/L (ref 0–44)
AST: 18 U/L (ref 15–41)
Albumin: 3.9 g/dL (ref 3.5–5.0)
Alkaline Phosphatase: 50 U/L (ref 38–126)
Anion gap: 7 (ref 5–15)
BUN: 10 mg/dL (ref 8–23)
CO2: 26 mmol/L (ref 22–32)
Calcium: 8.8 mg/dL — ABNORMAL LOW (ref 8.9–10.3)
Chloride: 107 mmol/L (ref 98–111)
Creatinine: 0.41 mg/dL — ABNORMAL LOW (ref 0.44–1.00)
GFR, Estimated: >60 mL/min (ref >60)
Glucose, Bld: 88 mg/dL (ref 70–99)
Potassium: 3.7 mmol/L (ref 3.5–5.1)
Sodium: 140 mmol/L (ref 135–145)
Total Bilirubin: 0.6 mg/dL (ref 0.3–1.2)
Total Protein: 7.2 g/dL (ref 6.5–8.1)

## 2023-03-04 LAB — CBC WITH DIFFERENTIAL (CANCER CENTER ONLY)
Abs Immature Granulocytes: 0.01 10*3/uL (ref 0.00–0.07)
Basophils Absolute: 0 10*3/uL (ref 0.0–0.1)
Basophils Relative: 0 %
Eosinophils Absolute: 0 10*3/uL (ref 0.0–0.5)
Eosinophils Relative: 0 %
HCT: 34.7 % — ABNORMAL LOW (ref 36.0–46.0)
Hemoglobin: 11 g/dL — ABNORMAL LOW (ref 12.0–15.0)
Immature Granulocytes: 0 %
Lymphocytes Relative: 64 %
Lymphs Abs: 1.4 10*3/uL (ref 0.7–4.0)
MCH: 30.9 pg (ref 26.0–34.0)
MCHC: 31.7 g/dL (ref 30.0–36.0)
MCV: 97.5 fL (ref 80.0–100.0)
Monocytes Absolute: 0.3 10*3/uL (ref 0.1–1.0)
Monocytes Relative: 11 %
Neutro Abs: 0.6 10*3/uL — ABNORMAL LOW (ref 1.7–7.7)
Neutrophils Relative %: 25 %
Platelet Count: 96 10*3/uL — ABNORMAL LOW (ref 150–400)
RBC: 3.56 MIL/uL — ABNORMAL LOW (ref 3.87–5.11)
RDW: 18.8 % — ABNORMAL HIGH (ref 11.5–15.5)
Smear Review: NORMAL
WBC Count: 2.3 10*3/uL — ABNORMAL LOW (ref 4.0–10.5)
nRBC: 0 % (ref 0.0–0.2)

## 2023-03-04 MED ORDER — SODIUM CHLORIDE 0.9% FLUSH
10.0000 mL | Freq: Once | INTRAVENOUS | Status: AC
  Administered 2023-03-04: 10 mL

## 2023-03-04 MED ORDER — SODIUM CHLORIDE 0.9 % IV SOLN: Freq: Once | INTRAVENOUS | Status: AC

## 2023-03-04 MED ORDER — SODIUM CHLORIDE 0.9% FLUSH
10.0000 mL | INTRAVENOUS | Status: DC | PRN
  Administered 2023-03-04: 10 mL

## 2023-03-04 MED ORDER — SODIUM CHLORIDE 0.9 % IV SOLN
20.0000 mg/m2 | Freq: Once | INTRAVENOUS | Status: AC
  Administered 2023-03-04: 40 mg via INTRAVENOUS
  Filled 2023-03-04: qty 8

## 2023-03-04 MED ORDER — HEPARIN SOD (PORK) LOCK FLUSH 100 UNIT/ML IV SOLN
500.0000 [IU] | Freq: Once | INTRAVENOUS | Status: AC | PRN
  Administered 2023-03-04: 500 [IU]

## 2023-03-04 MED ORDER — PROCHLORPERAZINE MALEATE 10 MG PO TABS
10.0000 mg | ORAL_TABLET | Freq: Once | ORAL | Status: AC
  Administered 2023-03-04: 10 mg via ORAL
  Filled 2023-03-04: qty 1

## 2023-03-04 NOTE — Progress Notes (Signed)
[facility] Cancer Follow up:    [name] [name], [name], MD [name] [name]   DIAGNOSIS: Myelodysplastic Syndrome  SUMMARY OF ONCOLOGIC HISTORY: Oncology History  MDS (myelodysplastic syndrome), high grade (HCC)  01/07/2023 Initial Biopsy   Bone marrow biopsy:cellular bone marrow with focal areas of hypercellularity (approximately up to  50%).  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  Blasts appear mildly mildly increased, approximately 5% to focally up to 10%.  Flow cytometric analysis reveals 5% CD34 positive blasts.  While the findings could be attributable to the patient's coexisting conditions, the possibility of a myeloid neoplasm such as myelodysplastic neoplasm with increased blasts cannot be entirely excluded. normal karyotype   01/29/2023 Initial Diagnosis   MDS (myelodysplastic syndrome), high grade (HCC)   02/04/2023 - 02/06/2023 Chemotherapy   Patient is on Treatment Plan : MYELODYSPLASIA  Azacitidine IV D1-5 q28d     03/04/2023 -  Chemotherapy   Patient is on Treatment Plan : MYELODYSPLASIA Decitabine D1-5 q28d       CURRENT THERAPY: Azacitidine   [name] [name] 77 y.o. female returns for follow-up after receiving azacitidine.  Unfortunately 2 to 3 days into the azacitidine she developed a rash and azacitidine was held.  She was switched to decitabine.  Discussed the use of AI scribe software for clinical note transcription with the patient, who gave verbal consent to proceed.  History of Present Illness    She has a friend who is married to Mr Suzie Portela at [facility] and suggested she go for a second opinion to [facility]. She was wondering about this. Otherwise she is doing well. No more itching. No bleeding issues.  The patient has recently moved to an independent living facility and is adjusting to her new environment. She has a daughter who lives nearby and a son who lives in [place]. She has a friend who is an Youth worker and has been helping her with her new home.     Patient Active Problem List   Diagnosis Date Noted   MDS (myelodysplastic syndrome), high grade (HCC) 01/29/2023   Obesity 03/02/2021   Other long term (current) drug therapy 03/02/2021   Primary osteoarthritis 03/02/2021   SOB (shortness of breath) 12/08/2020   Pericardial effusion 12/08/2020   Dry eyes/dry mouth 05/25/2019   Bronchitis, mucopurulent recurrent (HCC) 02/26/2019   Moderate persistent asthma without complication 02/24/2019   Perennial and seasonal allergic rhinitis 02/24/2019   Seasonal and perennial allergic rhinoconjunctivitis 02/24/2019   Primary osteoarthritis of both knees 04/10/2016   Acute chest pain 08/19/2014   Costochondritis 08/19/2014   Lung nodule 08/19/2014   HTN (hypertension) 08/07/2012   HLD (hyperlipidemia) 09/26/2011   Asthma 09/06/2010   Depression 09/06/2010   History of allergy 09/06/2010   Hypothyroidism 09/06/2010   Lower back pain 09/06/2010   Arthritis 09/06/2010   Pain in joint, pelvic region and thigh 09/06/2010   Peripheral neuropathy 09/06/2010   Psoriasis 09/06/2010   Therapeutic drug monitoring 09/06/2010   Tinnitus 09/06/2010   Vitamin B 12 deficiency 09/06/2010   Cervical spondylosis without myelopathy 04/24/2006   Tear of lateral cartilage or meniscus of knee, current 03/13/2006    is allergic to latex, cat hair extract, dust mite extract, molds & smuts, other, and pollen extract-tree extract [pollen extract].  MEDICAL HISTORY: Past Medical History:  Diagnosis Date   Asthma    Diverticulitis    Hypothyroid    Psoriatic arthritis (HCC)     SURGICAL  HISTORY: Past Surgical History:  Procedure Laterality Date   COLONOSCOPY  08/2021   HIP SURGERY Bilateral    IR IMAGING GUIDED PORT INSERTION  02/20/2023   TONSILLECTOMY     TUBAL LIGATION      SOCIAL HISTORY: Social History   Socioeconomic History   Marital status: Widowed    Spouse name: Not on file    Number of children: Not on file   Years of education: Not on file   Highest education level: Not on file  Occupational History   Not on file  Tobacco Use   Smoking status: Former    Current packs/day: 0.00    Average packs/day: 0.1 packs/day for 33.4 years (3.3 ttl pk-yrs)    Types: Cigarettes    Start date: 72    Quit date: 11/18/2000    Years since quitting: 22.3   Smokeless tobacco: Never   Tobacco comments:    3 cigarettes a day  Vaping Use   Vaping status: Never Used  Substance and Sexual Activity   Alcohol use: Never   Drug use: Never   Sexual activity: Not Currently  Other Topics Concern   Not on file  Social History Narrative   Lives with children   Social Determinants of Health   Financial Resource Strain: Not on file  Food Insecurity: No Food Insecurity (11/24/2022)   Hunger Vital Sign    Worried About Running Out of Food in the Last Year: Never true    Ran Out of Food in the Last Year: Never true  Transportation Needs: No Transportation Needs (11/24/2022)   PRAPARE - Administrator, Civil Service (Medical): No    Lack of Transportation (Non-Medical): No  Physical Activity: Not on file  Stress: Not on file  Social Connections: Not on file  Intimate Partner Violence: Not At Risk (11/24/2022)   Humiliation, Afraid, Rape, and Kick questionnaire    Fear of Current or Ex-Partner: No    Emotionally Abused: No    Physically Abused: No    Sexually Abused: No    FAMILY HISTORY: Family History  Problem Relation Age of Onset   Asthma Mother    Allergic rhinitis Mother    Rheum arthritis Mother    Allergic rhinitis Father    Alzheimer's disease Father    Eczema Brother    Allergic rhinitis Brother    Colon cancer Neg Hx    Esophageal cancer Neg Hx    Stomach cancer Neg Hx    Rectal cancer Neg Hx     Review of Systems  Constitutional:  Negative for appetite change, chills, fatigue, fever and unexpected weight change.  HENT:   Negative for hearing  loss, lump/mass and trouble swallowing.   Eyes:  Negative for eye problems and icterus.  Respiratory:  Negative for chest tightness, cough and shortness of breath.   Cardiovascular:  Negative for chest pain, leg swelling and palpitations.  Gastrointestinal:  Negative for abdominal distention, abdominal pain, constipation, diarrhea, nausea and vomiting.  Endocrine: Negative for hot flashes.  Genitourinary:  Negative for difficulty urinating.   Musculoskeletal:  Negative for arthralgias.  Skin:  Negative for itching and rash.  Neurological:  Negative for dizziness, extremity weakness, headaches and numbness.  Hematological:  Negative for adenopathy. Does not bruise/bleed easily.  Psychiatric/Behavioral:  Negative for depression. The patient is not nervous/anxious.       PHYSICAL EXAMINATION     Vitals:   03/04/23 1349  BP: (!) 121/47  Pulse: 72  Resp: 16  Temp: 98.3 F (36.8 C)  SpO2: 98%    Physical Exam Constitutional:      General: She is not in acute distress.    Appearance: Normal appearance. She is not toxic-appearing.  HENT:     Head: Normocephalic and atraumatic.  Eyes:     General: No scleral icterus. Musculoskeletal:        General: No swelling.     Cervical back: Neck supple.  Skin:    General: Skin is warm and dry.     Findings: No rash (Rash improving significantly).  Neurological:     General: No focal deficit present.     Mental Status: She is alert.  Psychiatric:        Mood and Affect: Mood normal.        Behavior: Behavior normal.     LABORATORY DATA:  CBC    Component Value Date/Time   WBC 2.3 (L) 03/04/2023 1308   WBC 2.8 (L) 02/21/2023 1504   RBC 3.56 (L) 03/04/2023 1308   HGB 11.0 (L) 03/04/2023 1308   HCT 34.7 (L) 03/04/2023 1308   HCT 39.0 11/24/2022 1035   PLT 96 (L) 03/04/2023 1308   MCV 97.5 03/04/2023 1308   MCH 30.9 03/04/2023 1308   MCHC 31.7 03/04/2023 1308   RDW 18.8 (H) 03/04/2023 1308   LYMPHSABS 1.4 03/04/2023 1308    MONOABS 0.3 03/04/2023 1308   EOSABS 0.0 03/04/2023 1308   BASOSABS 0.0 03/04/2023 1308    CMP     Component Value Date/Time   NA 140 03/04/2023 1308   K 3.7 03/04/2023 1308   CL 107 03/04/2023 1308   CO2 26 03/04/2023 1308   GLUCOSE 88 03/04/2023 1308   BUN 10 03/04/2023 1308   CREATININE 0.41 (L) 03/04/2023 1308   CALCIUM 8.8 (L) 03/04/2023 1308   PROT 7.2 03/04/2023 1308   ALBUMIN 3.9 03/04/2023 1308   AST 18 03/04/2023 1308   ALT 9 03/04/2023 1308   ALKPHOS 50 03/04/2023 1308   BILITOT 0.6 03/04/2023 1308   GFRNONAA >60 03/04/2023 1308        ASSESSMENT and THERAPY PLAN:   MDS (myelodysplastic syndrome), high grade (HCC) This is a very pleasant 77 year old female patient with MDS, ASXL1 mutation, CBL, 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  4 points - IPSS-R Score Intermediate risk Median survival - 3 yrs Median time to 25% AML evolution: 3.2 years  01/07/2023: Bone marrow aspirate: Hypercellular bone marrow 50% cellularity with dysplasia, flow cytometry: 5% CD34 positive blasts, cytogenetics: Normal MDS [employer] panel showed ASXL , CBL, IDH2 and SRSF2.   She was treated with azacitidine but unfortunately 3 days into treatment developed a rash likely secondary to azacitidine.  She will be moving forward with decitabine today  Myelodysplastic Syndrome (MDS) Intermediate risk MDS. Patient has shown response to treatment with Azacitidine, but developed a severe rash. Discussed the potential for bone marrow transplant as a curative option, but noted age and intensity of procedure as potential barriers. -Start Decitabine treatment today for 5 days, with 23 days off. -Refer to transplant coordinators at [facility] for evaluation and discussion of potential bone marrow transplant. -Check response to Decitabine after 1-2 cycles of treatment.  Rash Residual itching on abdomen from severe rash likely secondary to  Azacitidine. -Monitor for any worsening or new rash during Decitabine treatment.  General Health Maintenance -Continue monitoring platelet count (currently 96,000). -Follow-up after completion of Decitabine treatment cycle.  RTC as scheduled.      All questions were answered. The patient knows to call the clinic with any problems, questions or concerns. We can certainly see the patient much sooner if necessary.  Total encounter time:30 minutes*in face-to-face visit time, chart review, lab review, care coordination, order entry, and documentation of the encounter time.    *Total Encounter Time as defined by the Centers for Medicare and Medicaid Services includes, in addition to the face-to-face time of a patient visit (documented in the note above) non-face-to-face time: obtaining and reviewing outside history, ordering and reviewing medications, tests or procedures, care coordination (communications with other health care professionals or caregivers) and documentation in the medical record.

## 2023-03-04 NOTE — Patient Instructions (Signed)
[place] [facility]  Discharge Instructions: Thank you for choosing [facility] to provide your oncology and hematology care.   If you have a lab appointment with the Cancer Center, please go directly to the Cancer Center and check in at the registration area.   Wear comfortable clothing and clothing appropriate for easy access to any Portacath or PICC line.   We strive to give you quality time with your provider. You may need to reschedule your appointment if you arrive late (15 or more minutes).  Arriving late affects you and other patients whose appointments are after yours.  Also, if you miss three or more appointments without notifying the office, you may be dismissed from the clinic at the provider's discretion.      For prescription refill requests, have your pharmacy contact our office and allow 72 hours for refills to be completed.    Today you received the following chemotherapy and/or immunotherapy agents dacogen      To help prevent nausea and vomiting after your treatment, we encourage you to take your nausea medication as directed.  BELOW ARE SYMPTOMS THAT SHOULD BE REPORTED IMMEDIATELY: *FEVER GREATER THAN 100.4 F (38 C) OR HIGHER *CHILLS OR SWEATING *NAUSEA AND VOMITING THAT IS NOT CONTROLLED WITH YOUR NAUSEA MEDICATION *UNUSUAL SHORTNESS OF BREATH *UNUSUAL BRUISING OR BLEEDING *URINARY PROBLEMS (pain or burning when urinating, or frequent urination) *BOWEL PROBLEMS (unusual diarrhea, constipation, pain near the anus) TENDERNESS IN MOUTH AND THROAT WITH OR WITHOUT PRESENCE OF ULCERS (sore throat, sores in mouth, or a toothache) UNUSUAL RASH, SWELLING OR PAIN  UNUSUAL VAGINAL DISCHARGE OR ITCHING   Items with * indicate a potential emergency and should be followed up as soon as possible or go to the Emergency Department if any problems should occur.  Please show the CHEMOTHERAPY ALERT CARD or IMMUNOTHERAPY ALERT CARD at check-in  to the Emergency Department and triage nurse.  Should you have questions after your visit or need to cancel or reschedule your appointment, please contact [place] [facility]  Dept: [phone]  and follow the prompts.  Office hours are 8:00 a.m. to 4:30 p.m. Monday - Friday. Please note that voicemails left after 4:00 p.m. may not be returned until the following business day.  We are closed weekends and major holidays. You have access to a nurse at all times for urgent questions. Please call the main number to the clinic Dept: [phone] and follow the prompts.   For any non-urgent questions, you may also contact your provider using MyChart. We now offer e-Visits for anyone 61 and older to request care online for non-urgent symptoms. For details visit [email or web address].   Also download the MyChart app! Go to the app store, search "MyChart", open the app, select [place], and log in with your MyChart username and password.

## 2023-03-04 NOTE — Progress Notes (Signed)
Per Dr. Al Pimple, ok to treat with low ANC and low platelets

## 2023-03-04 NOTE — Assessment & Plan Note (Signed)
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  4 points - IPSS-R Score Intermediate risk Median survival - 3 yrs Median time to 25% AML evolution: 3.2 years  01/07/2023: Bone marrow aspirate: Hypercellular bone marrow 50% cellularity with dysplasia, flow cytometry: 5% CD34 positive blasts, cytogenetics: Normal MDS [employer] panel showed ASXL , CBL, IDH2 and SRSF2.   She was treated with azacitidine but unfortunately 3 days into treatment developed a rash likely secondary to azacitidine.  She will be moving forward with decitabine today  Myelodysplastic Syndrome (MDS) Intermediate risk MDS. Patient has shown response to treatment with Azacitidine, but developed a severe rash. Discussed the potential for bone marrow transplant as a curative option, but noted age and intensity of procedure as potential barriers. -Start Decitabine treatment today for 5 days, with 23 days off. -Refer to transplant coordinators at [facility] for evaluation and discussion of potential bone marrow transplant. -Check response to Decitabine after 1-2 cycles of treatment.  Rash Residual itching on abdomen from severe rash likely secondary to Azacitidine. -Monitor for any worsening or new rash during Decitabine treatment.  General Health Maintenance -Continue monitoring platelet count (currently 96,000). -Follow-up after completion of Decitabine treatment cycle.  RTC as scheduled.

## 2023-03-05 ENCOUNTER — Telehealth: Payer: Self-pay

## 2023-03-05 ENCOUNTER — Encounter: Payer: Self-pay | Admitting: Hematology and Oncology

## 2023-03-05 ENCOUNTER — Inpatient Hospital Stay: Payer: Medicare Other

## 2023-03-05 VITALS — BP 115/68 | HR 93 | Temp 98.6°F | Resp 16

## 2023-03-05 DIAGNOSIS — Z5111 Encounter for antineoplastic chemotherapy: Secondary | ICD-10-CM | POA: Diagnosis not present

## 2023-03-05 DIAGNOSIS — D46Z Other myelodysplastic syndromes: Secondary | ICD-10-CM

## 2023-03-05 MED ORDER — SODIUM CHLORIDE 0.9% FLUSH
10.0000 mL | INTRAVENOUS | Status: DC | PRN
  Administered 2023-03-05: 10 mL

## 2023-03-05 MED ORDER — HEPARIN SOD (PORK) LOCK FLUSH 100 UNIT/ML IV SOLN
500.0000 [IU] | Freq: Once | INTRAVENOUS | Status: AC | PRN
  Administered 2023-03-05: 500 [IU]

## 2023-03-05 MED ORDER — SODIUM CHLORIDE 0.9 % IV SOLN
20.0000 mg/m2 | Freq: Once | INTRAVENOUS | Status: AC
  Administered 2023-03-05: 40 mg via INTRAVENOUS
  Filled 2023-03-05: qty 8

## 2023-03-05 MED ORDER — PROCHLORPERAZINE MALEATE 10 MG PO TABS
10.0000 mg | ORAL_TABLET | Freq: Once | ORAL | Status: AC
  Administered 2023-03-05: 10 mg via ORAL
  Filled 2023-03-05: qty 1

## 2023-03-05 MED ORDER — SODIUM CHLORIDE 0.9 % IV SOLN: Freq: Once | INTRAVENOUS | Status: AC

## 2023-03-05 NOTE — Patient Instructions (Signed)
[place] [facility] AT [facility]  Discharge Instructions: Thank you for choosing [facility] to provide your oncology and hematology care.   If you have a lab appointment with the Cancer Center, please go directly to the Cancer Center and check in at the registration area.   Wear comfortable clothing and clothing appropriate for easy access to any Portacath or PICC line.   We strive to give you quality time with your provider. You may need to reschedule your appointment if you arrive late (15 or more minutes).  Arriving late affects you and other patients whose appointments are after yours.  Also, if you miss three or more appointments without notifying the office, you may be dismissed from the clinic at the provider's discretion.      For prescription refill requests, have your pharmacy contact our office and allow 72 hours for refills to be completed.    Today you received the following chemotherapy and/or immunotherapy agents dacogen      To help prevent nausea and vomiting after your treatment, we encourage you to take your nausea medication as directed.  BELOW ARE SYMPTOMS THAT SHOULD BE REPORTED IMMEDIATELY: *FEVER GREATER THAN 100.4 F (38 C) OR HIGHER *CHILLS OR SWEATING *NAUSEA AND VOMITING THAT IS NOT CONTROLLED WITH YOUR NAUSEA MEDICATION *UNUSUAL SHORTNESS OF BREATH *UNUSUAL BRUISING OR BLEEDING *URINARY PROBLEMS (pain or burning when urinating, or frequent urination) *BOWEL PROBLEMS (unusual diarrhea, constipation, pain near the anus) TENDERNESS IN MOUTH AND THROAT WITH OR WITHOUT PRESENCE OF ULCERS (sore throat, sores in mouth, or a toothache) UNUSUAL RASH, SWELLING OR PAIN  UNUSUAL VAGINAL DISCHARGE OR ITCHING   Items with * indicate a potential emergency and should be followed up as soon as possible or go to the Emergency Department if any problems should occur.  Please show the CHEMOTHERAPY ALERT CARD or IMMUNOTHERAPY ALERT CARD at check-in  to the Emergency Department and triage nurse.  Should you have questions after your visit or need to cancel or reschedule your appointment, please contact [place] [facility] AT [facility]  Dept: [phone]  and follow the prompts.  Office hours are 8:00 a.m. to 4:30 p.m. Monday - Friday. Please note that voicemails left after 4:00 p.m. may not be returned until the following business day.  We are closed weekends and major holidays. You have access to a nurse at all times for urgent questions. Please call the main number to the clinic Dept: [phone] and follow the prompts.   For any non-urgent questions, you may also contact your provider using MyChart. We now offer e-Visits for anyone 26 and older to request care online for non-urgent symptoms. For details visit [email or web address].   Also download the MyChart app! Go to the app store, search "MyChart", open the app, select , and log in with your MyChart username and password.

## 2023-03-05 NOTE — Telephone Encounter (Signed)
Referral faxed to the [facility] Hematology/oncology team for the stem cell transplant team. Successful tx notice received.

## 2023-03-06 ENCOUNTER — Inpatient Hospital Stay: Payer: Medicare Other

## 2023-03-06 VITALS — BP 111/70 | HR 90 | Temp 98.9°F | Resp 16

## 2023-03-06 DIAGNOSIS — D46Z Other myelodysplastic syndromes: Secondary | ICD-10-CM

## 2023-03-06 DIAGNOSIS — Z5111 Encounter for antineoplastic chemotherapy: Secondary | ICD-10-CM | POA: Diagnosis not present

## 2023-03-06 MED ORDER — PROCHLORPERAZINE MALEATE 10 MG PO TABS
10.0000 mg | ORAL_TABLET | Freq: Once | ORAL | Status: AC
  Administered 2023-03-06: 10 mg via ORAL
  Filled 2023-03-06: qty 1

## 2023-03-06 MED ORDER — SODIUM CHLORIDE 0.9 % IV SOLN
20.0000 mg/m2 | Freq: Once | INTRAVENOUS | Status: AC
  Administered 2023-03-06: 40 mg via INTRAVENOUS
  Filled 2023-03-06: qty 8

## 2023-03-06 MED ORDER — SODIUM CHLORIDE 0.9% FLUSH
10.0000 mL | INTRAVENOUS | Status: DC | PRN
  Administered 2023-03-06: 10 mL

## 2023-03-06 MED ORDER — HEPARIN SOD (PORK) LOCK FLUSH 100 UNIT/ML IV SOLN
500.0000 [IU] | Freq: Once | INTRAVENOUS | Status: AC | PRN
  Administered 2023-03-06: 500 [IU]

## 2023-03-06 MED ORDER — SODIUM CHLORIDE 0.9 % IV SOLN: Freq: Once | INTRAVENOUS | Status: AC

## 2023-03-06 NOTE — Patient Instructions (Signed)
[place] [facility] AT [facility]  Discharge Instructions: Thank you for choosing [facility] to provide your oncology and hematology care.   If you have a lab appointment with the Cancer Center, please go directly to the Cancer Center and check in at the registration area.   Wear comfortable clothing and clothing appropriate for easy access to any Portacath or PICC line.   We strive to give you quality time with your provider. You may need to reschedule your appointment if you arrive late (15 or more minutes).  Arriving late affects you and other patients whose appointments are after yours.  Also, if you miss three or more appointments without notifying the office, you may be dismissed from the clinic at the provider's discretion.      For prescription refill requests, have your pharmacy contact our office and allow 72 hours for refills to be completed.    Today you received the following chemotherapy and/or immunotherapy agents Dacogen      To help prevent nausea and vomiting after your treatment, we encourage you to take your nausea medication as directed.  BELOW ARE SYMPTOMS THAT SHOULD BE REPORTED IMMEDIATELY: *FEVER GREATER THAN 100.4 F (38 C) OR HIGHER *CHILLS OR SWEATING *NAUSEA AND VOMITING THAT IS NOT CONTROLLED WITH YOUR NAUSEA MEDICATION *UNUSUAL SHORTNESS OF BREATH *UNUSUAL BRUISING OR BLEEDING *URINARY PROBLEMS (pain or burning when urinating, or frequent urination) *BOWEL PROBLEMS (unusual diarrhea, constipation, pain near the anus) TENDERNESS IN MOUTH AND THROAT WITH OR WITHOUT PRESENCE OF ULCERS (sore throat, sores in mouth, or a toothache) UNUSUAL RASH, SWELLING OR PAIN  UNUSUAL VAGINAL DISCHARGE OR ITCHING   Items with * indicate a potential emergency and should be followed up as soon as possible or go to the Emergency Department if any problems should occur.  Please show the CHEMOTHERAPY ALERT CARD or IMMUNOTHERAPY ALERT CARD at check-in  to the Emergency Department and triage nurse.  Should you have questions after your visit or need to cancel or reschedule your appointment, please contact [place] [facility]  Dept: [phone]  and follow the prompts.  Office hours are 8:00 a.m. to 4:30 p.m. Monday - Friday. Please note that voicemails left after 4:00 p.m. may not be returned until the following business day.  We are closed weekends and major holidays. You have access to a nurse at all times for urgent questions. Please call the main number to the clinic Dept: [phone] and follow the prompts.   For any non-urgent questions, you may also contact your provider using MyChart. We now offer e-Visits for anyone 1 and older to request care online for non-urgent symptoms. For details visit [email or web address].   Also download the MyChart app! Go to the app store, search "MyChart", open the app, select [place], and log in with your MyChart username and password.

## 2023-03-07 ENCOUNTER — Other Ambulatory Visit: Payer: Self-pay

## 2023-03-07 ENCOUNTER — Inpatient Hospital Stay: Payer: Medicare Other

## 2023-03-07 VITALS — BP 114/64 | HR 94 | Temp 98.7°F | Resp 17

## 2023-03-07 DIAGNOSIS — D469 Myelodysplastic syndrome, unspecified: Secondary | ICD-10-CM

## 2023-03-07 DIAGNOSIS — Z5111 Encounter for antineoplastic chemotherapy: Secondary | ICD-10-CM | POA: Diagnosis not present

## 2023-03-07 DIAGNOSIS — D46Z Other myelodysplastic syndromes: Secondary | ICD-10-CM

## 2023-03-07 LAB — URINALYSIS, COMPLETE (UACMP) WITH MICROSCOPIC
Bilirubin Urine: NEGATIVE
Glucose, UA: NEGATIVE mg/dL
Ketones, ur: 5 mg/dL — AB
Nitrite: POSITIVE — AB
Protein, ur: NEGATIVE mg/dL
Specific Gravity, Urine: 1.011 (ref 1.005–1.030)
pH: 6 (ref 5.0–8.0)

## 2023-03-07 MED ORDER — HEPARIN SOD (PORK) LOCK FLUSH 100 UNIT/ML IV SOLN
500.0000 [IU] | Freq: Once | INTRAVENOUS | Status: AC | PRN
  Administered 2023-03-07: 500 [IU]

## 2023-03-07 MED ORDER — SODIUM CHLORIDE 0.9 % IV SOLN
20.0000 mg/m2 | Freq: Once | INTRAVENOUS | Status: AC
  Administered 2023-03-07: 40 mg via INTRAVENOUS
  Filled 2023-03-07: qty 8

## 2023-03-07 MED ORDER — PROCHLORPERAZINE MALEATE 10 MG PO TABS
10.0000 mg | ORAL_TABLET | Freq: Once | ORAL | Status: AC
  Administered 2023-03-07: 10 mg via ORAL
  Filled 2023-03-07: qty 1

## 2023-03-07 MED ORDER — SODIUM CHLORIDE 0.9% FLUSH
10.0000 mL | INTRAVENOUS | Status: DC | PRN
  Administered 2023-03-07: 10 mL

## 2023-03-07 MED ORDER — SODIUM CHLORIDE 0.9 % IV SOLN: Freq: Once | INTRAVENOUS | Status: AC

## 2023-03-07 NOTE — Patient Instructions (Signed)
[place] [facility] AT [facility]  Discharge Instructions: Thank you for choosing [facility] to provide your oncology and hematology care.   If you have a lab appointment with the Cancer Center, please go directly to the Cancer Center and check in at the registration area.   Wear comfortable clothing and clothing appropriate for easy access to any Portacath or PICC line.   We strive to give you quality time with your provider. You may need to reschedule your appointment if you arrive late (15 or more minutes).  Arriving late affects you and other patients whose appointments are after yours.  Also, if you miss three or more appointments without notifying the office, you may be dismissed from the clinic at the provider's discretion.      For prescription refill requests, have your pharmacy contact our office and allow 72 hours for refills to be completed.    Today you received the following chemotherapy and/or immunotherapy agents: Dacogen      To help prevent nausea and vomiting after your treatment, we encourage you to take your nausea medication as directed.  BELOW ARE SYMPTOMS THAT SHOULD BE REPORTED IMMEDIATELY: *FEVER GREATER THAN 100.4 F (38 C) OR HIGHER *CHILLS OR SWEATING *NAUSEA AND VOMITING THAT IS NOT CONTROLLED WITH YOUR NAUSEA MEDICATION *UNUSUAL SHORTNESS OF BREATH *UNUSUAL BRUISING OR BLEEDING *URINARY PROBLEMS (pain or burning when urinating, or frequent urination) *BOWEL PROBLEMS (unusual diarrhea, constipation, pain near the anus) TENDERNESS IN MOUTH AND THROAT WITH OR WITHOUT PRESENCE OF ULCERS (sore throat, sores in mouth, or a toothache) UNUSUAL RASH, SWELLING OR PAIN  UNUSUAL VAGINAL DISCHARGE OR ITCHING   Items with * indicate a potential emergency and should be followed up as soon as possible or go to the Emergency Department if any problems should occur.  Please show the CHEMOTHERAPY ALERT CARD or IMMUNOTHERAPY ALERT CARD at  check-in to the Emergency Department and triage nurse.  Should you have questions after your visit or need to cancel or reschedule your appointment, please contact [place] [facility] AT [facility]  Dept: [phone]  and follow the prompts.  Office hours are 8:00 a.m. to 4:30 p.m. Monday - Friday. Please note that voicemails left after 4:00 p.m. may not be returned until the following business day.  We are closed weekends and major holidays. You have access to a nurse at all times for urgent questions. Please call the main number to the clinic Dept: [phone] and follow the prompts.   For any non-urgent questions, you may also contact your provider using MyChart. We now offer e-Visits for anyone 44 and older to request care online for non-urgent symptoms. For details visit [email or web address].   Also download the MyChart app! Go to the app store, search "MyChart", open the app, select [place], and log in with your MyChart username and password.

## 2023-03-08 ENCOUNTER — Other Ambulatory Visit: Payer: Self-pay

## 2023-03-08 ENCOUNTER — Other Ambulatory Visit: Payer: Self-pay | Admitting: Hematology and Oncology

## 2023-03-08 ENCOUNTER — Inpatient Hospital Stay: Payer: Medicare Other

## 2023-03-08 VITALS — BP 118/78 | HR 94 | Temp 99.1°F | Resp 16

## 2023-03-08 DIAGNOSIS — D46Z Other myelodysplastic syndromes: Secondary | ICD-10-CM

## 2023-03-08 DIAGNOSIS — Z5111 Encounter for antineoplastic chemotherapy: Secondary | ICD-10-CM | POA: Diagnosis not present

## 2023-03-08 DIAGNOSIS — D1724 Benign lipomatous neoplasm of skin and subcutaneous tissue of left leg: Secondary | ICD-10-CM

## 2023-03-08 MED ORDER — CIPROFLOXACIN HCL 250 MG PO TABS: 250.0000 mg | ORAL_TABLET | Freq: Two times a day (BID) | ORAL | 0 refills | Status: DC

## 2023-03-08 MED ORDER — SODIUM CHLORIDE 0.9 % IV SOLN
20.0000 mg/m2 | Freq: Once | INTRAVENOUS | Status: AC
  Administered 2023-03-08: 40 mg via INTRAVENOUS
  Filled 2023-03-08: qty 8

## 2023-03-08 MED ORDER — PROCHLORPERAZINE MALEATE 10 MG PO TABS
10.0000 mg | ORAL_TABLET | Freq: Once | ORAL | Status: AC
  Administered 2023-03-08: 10 mg via ORAL
  Filled 2023-03-08: qty 1

## 2023-03-08 MED ORDER — HEPARIN SOD (PORK) LOCK FLUSH 100 UNIT/ML IV SOLN
500.0000 [IU] | Freq: Once | INTRAVENOUS | Status: AC | PRN
  Administered 2023-03-08: 500 [IU]

## 2023-03-08 MED ORDER — SODIUM CHLORIDE 0.9 % IV SOLN: Freq: Once | INTRAVENOUS | Status: AC

## 2023-03-08 MED ORDER — SODIUM CHLORIDE 0.9% FLUSH
10.0000 mL | INTRAVENOUS | Status: DC | PRN
  Administered 2023-03-08: 10 mL

## 2023-03-08 NOTE — Progress Notes (Signed)
Patient noted a painful "knot" on the back of her right leg that she noticed this morning.  RN observed site and noted ping-pong size soft moveable mass on the back of her leg. Dr. Leonides Schanz, on-call MD, made aware and assessed patient at bedside.  Per Dr. Leonides Schanz, site is not concerning for DVT, however he will follow-up with patient's primary oncologist, Dr. Al Pimple, for further work-up with ultrasound.  Patient agreeable to the plan. Patient stable and ambulatory at time of discharge.

## 2023-03-08 NOTE — Patient Instructions (Signed)
Ciprofloxacin Tablets What is this medication? CIPROFLOXACIN (sip roe FLOX a sin) treats infections caused by bacteria. It belongs to a group of medications called quinolone antibiotics. It will not treat colds, the flu, or infections caused by viruses. This medicine may be used for other purposes; ask your health care provider or pharmacist if you have questions. COMMON BRAND NAME(S): Cipro What should I tell my care team before I take this medication? They need to know if you have any of these conditions: Bone, joint, or tendon problems Diabetes Heart disease History of irregular heartbeat or rhythm Low levels of potassium or magnesium in the blood Kidney disease Liver disease Myasthenia gravis Seizures Tingling of the fingers or toes or other nerve disorder An unusual or allergic reaction to ciprofloxacin, other medications, foods, dyes, or preservatives Pregnant or trying to get pregnant Breastfeeding How should I use this medication? Take this medication by mouth with a full glass of water. Take it as directed on the prescription label at the same time every day. Do not crush or chew this medication. You may cut the tablet in half if it is scored (has a line in the middle of it). This may help you swallow the tablet if the whole tablet is too big. Be sure to take both halves. Do not take just one-half of the tablet. You can take it with or without food. If it upsets your stomach, take it with food. Take all of this medication unless your care team tells you to stop it early. Keep taking it even if you think you are better. Take products with aluminum, calcium, iron, magnesium, or zinc in them at a different time of day than this medication. Take these products 6 hours BEFORE or 2 hours AFTER taking a dose of this medication. A special MedGuide will be given to you by the pharmacist with each prescription and refill. Be sure to read this information carefully each time. Talk to your care  team about the use of this medication in children. Special care may be needed. Overdosage: If you think you have taken too much of this medicine contact a poison control center or emergency room at once. NOTE: This medicine is only for you. Do not share this medicine with others. What if I miss a dose? If you miss a dose, take it as soon as you can. If it is almost time for your next dose, take only that dose. Do not take double or extra doses. What may interact with this medication? Do not take this medication with any of the following: Cisapride Dronedarone Flibanserin Lomitapide Pimozide Thioridazine Tizanidine This medication may also interact with the following: Antacids Caffeine Certain medications for diabetes, such as glipizide, glyburide, or insulin Certain medications that treat or prevent blood clots, such as warfarin Clozapine Cyclosporine Didanosine buffered tablets or powder Dofetilide Duloxetine Estrogen or progestin hormones Lanthanum carbonate Lidocaine Methotrexate Multivitamins NSAIDS, medications for pain and inflammation, such as ibuprofen or naproxen Olanzapine Omeprazole Other medications that cause heart rhythm changes Phenytoin Probenecid Ropinirole Sevelamer Sildenafil Sucralfate Theophylline Ziprasidone Zolpidem This list may not describe all possible interactions. Give your health care provider a list of all the medicines, herbs, non-prescription drugs, or dietary supplements you use. Also tell them if you smoke, drink alcohol, or use illegal drugs. Some items may interact with your medicine. What should I watch for while using this medication? Visit your care team for regular checks on your progress. Tell your care team if your symptoms do  not start to get better or if they get worse. This medication may affect your coordination, reaction time, or judgment. Do not drive or operate machinery until you know how this medication affects you. Sit  up or stand slowly to reduce the risk of dizzy or fainting spells. Drinking alcohol with this medication can increase the risk of these side effects. Do not treat diarrhea with over the counter products. Contact your care team if you have diarrhea that lasts more than 2 days or if it is severe and watery. This medication can make you more sensitive to the sun. Keep out of the sun. If you cannot avoid being in the sun, wear protective clothing and sunscreen. Do not use sun lamps, tanning beds, or tanning booths. This medication may cause tendon problems. Tendons are the cords of tissue that connect your muscles to your bones. Tell your care team right away if you have pain, swelling, or stiffness while you are taking this medication or after you have stopped treatment. The risk is higher in people older than 75 years of age, those taking steroid medications, and those who have had a kidney, heart, or lung transplant. This medication may worsen muscle weakness in people with myasthenia gravis. This can cause breathing problems. Call your care team right away if you have myasthenia gravis and have worsening symptoms while taking this medication. This medication may cause serious skin reactions. They can happen weeks to months after starting the medication. Contact your care team right away if you notice fevers or flu-like symptoms with a rash. The rash may be red or purple and then turn into blisters or peeling of the skin. You may also notice a red rash with swelling of the face, lips, or lymph nodes in your neck or under your arms. Tell your care team if you are taking medications to treat diabetes. This medication may cause changes to blood sugar levels. Talk to your care team about how often to check your blood sugar while taking this medication. Know the symptoms of low blood sugar and how to treat it. What side effects may I notice from receiving this medication? Side effects that you should report to  your care team as soon as possible: Allergic reactions--skin rash, itching, hives, swelling of the face, lips, tongue, or throat Heart rhythm changes--fast or irregular heartbeat, dizziness, feeling faint or lightheaded, chest pain, trouble breathing Increased pressure around the brain--severe headache, blurry vision, change in vision, nausea, vomiting Joint, muscle, or tendon pain, swelling, or stiffness Liver injury--right upper belly pain, loss of appetite, nausea, light-colored stool, dark yellow or brown urine, yellowing skin or eyes, unusual weakness or fatigue Mood and behavior changes--anxiety, nervousness, confusion, hallucinations, irritability, hostility, thoughts of suicide or self-harm, worsening mood, feelings of depression Pain, tingling, or numbness in the hands or feet Redness, blistering, peeling, or loosening of the skin, including inside the mouth Severe diarrhea, fever Seizures Sudden or severe chest, back, or stomach pain Unusual vaginal discharge, itching, or odor Side effects that usually do not require medical attention (report these to your care team if they continue or are bothersome): Diarrhea Dry mouth Headache Nausea Skin reactions on sun-exposed areas This list may not describe all possible side effects. Call your doctor for medical advice about side effects. You may report side effects to FDA at 1-800-FDA-1088. Where should I keep my medication? Keep out of the reach of children and pets. Store at room temperature below 30 degrees C (86 degrees F). Keep  container tightly closed. Throw away any unused medication after the expiration date. NOTE: This sheet is a summary. It may not cover all possible information. If you have questions about this medicine, talk to your doctor, pharmacist, or health care provider.  2024 Elsevier/Gold Standard (2022-05-23 00:00:00)

## 2023-03-11 ENCOUNTER — Encounter: Payer: Self-pay | Admitting: Hematology and Oncology

## 2023-03-13 ENCOUNTER — Encounter: Payer: Self-pay | Admitting: *Deleted

## 2023-03-15 ENCOUNTER — Ambulatory Visit (HOSPITAL_BASED_OUTPATIENT_CLINIC_OR_DEPARTMENT_OTHER): Payer: Medicare Other

## 2023-03-16 ENCOUNTER — Ambulatory Visit (HOSPITAL_BASED_OUTPATIENT_CLINIC_OR_DEPARTMENT_OTHER)
Admission: RE | Admit: 2023-03-16 | Discharge: 2023-03-16 | Disposition: A | Discharge status: Home / Self Care | Payer: Medicare Other | Source: Ambulatory Visit | Attending: Hematology and Oncology | Admitting: Hematology and Oncology

## 2023-03-16 DIAGNOSIS — D1724 Benign lipomatous neoplasm of skin and subcutaneous tissue of left leg: Secondary | ICD-10-CM | POA: Insufficient documentation

## 2023-03-18 ENCOUNTER — Encounter: Payer: Self-pay | Admitting: Family Medicine

## 2023-03-18 ENCOUNTER — Ambulatory Visit (INDEPENDENT_AMBULATORY_CARE_PROVIDER_SITE_OTHER): Payer: Medicare Other | Admitting: Family Medicine

## 2023-03-18 ENCOUNTER — Other Ambulatory Visit: Payer: Self-pay

## 2023-03-18 VITALS — BP 110/60 | HR 92 | Temp 98.0°F | Resp 16 | Wt 184.4 lb

## 2023-03-18 DIAGNOSIS — J3089 Other allergic rhinitis: Secondary | ICD-10-CM

## 2023-03-18 DIAGNOSIS — L509 Urticaria, unspecified: Secondary | ICD-10-CM | POA: Insufficient documentation

## 2023-03-18 DIAGNOSIS — J302 Other seasonal allergic rhinitis: Secondary | ICD-10-CM

## 2023-03-18 DIAGNOSIS — H1013 Acute atopic conjunctivitis, bilateral: Secondary | ICD-10-CM | POA: Diagnosis not present

## 2023-03-18 DIAGNOSIS — J4540 Moderate persistent asthma, uncomplicated: Secondary | ICD-10-CM | POA: Diagnosis not present

## 2023-03-18 DIAGNOSIS — H1010 Acute atopic conjunctivitis, unspecified eye: Secondary | ICD-10-CM

## 2023-03-18 MED ORDER — TRIAMCINOLONE ACETONIDE 0.1 % EX OINT: 1.0000 | TOPICAL_OINTMENT | Freq: Two times a day (BID) | CUTANEOUS | 2 refills | Status: AC

## 2023-03-18 NOTE — Progress Notes (Signed)
[address] [place] [address] Dept: [phone]  FOLLOW UP NOTE  Patient ID: [name] [name], female    DOB: 07-18-1945  Age: 77 y.o. MRN: [id number] Date of Office Visit: 03/18/2023  Assessment  Chief Complaint: Rash (About 3 days ago. Located in chest area, arms, small of back, and butt. Think it is from a bite but cause unknown.)  HPI [name] [name] is a 77 year old female who presents to the clinic for an acute evaluation. She was last seen in this clinic on 11/15/2022 by Dr. Dellis Anes for evaluation of asthma and allergic rhinitis. 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 Chart review indicates that 2-3 days after receiving azacitidine she developed a red, pinpoint, pruritic rash and azacitidine was held and subsequently changed to decitabine. Notes indicate she completed a 5 day cycle of decitabine on 03/08/2023 and is scheduled for cycle 2 to begin on 04/01/2023.   At today's visit, she reports that, about 3 days ago, she began to develop itch and raised red areas on her back, axilla, buttocks, and a small area on her chest between her breasts. She denies concomitant cardiopulmonary or gastrointestinal symptoms with this rash.  She reports this rash is somewhat itchy and she has been using Benadryl cool gel and cortisone with some relief of rash.  She does report this rash is very different in nature from the rash that occurred after receiving the initial chemotherapy agent azacitidine which was reported as pinprick, raised, red, and extremely pruritic.  She reports her current rash is somewhat pruritic and occurring in larger hive or welt like areas.  She denies new medications other than the second chemotherapy agent, decitabine.  She denies new foods, new personal care products, or insect stings.  She does report that she has moved to a new independent living facility about 2 weeks ago.  She reports there are no  drapes or carpet, however, the last tenants did have cats.   Allergic rhinitis is reported as moderately well-controlled with clear rhinorrhea as the main symptom.  She continues occasional Flonase and is not currently using a nasal saline rinse.  She occasionally takes an antihistamine with some relief of symptoms.  She last received allergen immunotherapy directed toward grass pollen, weed pollen, tree pollen, mold, cat, dog, and dust mites in 2020 from an outside facility in [place].   Allergic conjunctivitis is reported as poorly controlled with red and itchy eyes for which she is not currently using any medical intervention.  Asthma is reported as moderately well-controlled with cough occurring most mornings which is producing clear thick mucus.  She reports after the morning she is not experiencing cough, shortness of breath, or wheeze with activity or rest.  She continues Flovent 2 puffs twice a day and is not currently using Symbicort or Breztri.  She reports that she lost the [place] paperwork during her recent move.  Interestingly, she has previously been on Xolair for asthma control.  Her current medications are listed in the chart.  Drug Allergies:  Allergies  Allergen Reactions   Latex Itching and Rash   Cat Hair Extract Other (See Comments)   Dust Mite Extract Other (See Comments)   Molds & Smuts     Other reaction(s): Unknown   Other     Other reaction(s): Unknown Other reaction(s): Unknown   Pollen Extract-Tree Extract [Pollen Extract] Other (See Comments)    Physical Exam: BP 110/60   Pulse  92   Temp 98 F (36.7 C) (Temporal)   Resp 16   Wt 184 lb 6.4 oz (83.6 kg)   SpO2 96%   BMI 28.04 kg/m    Physical Exam Vitals reviewed.  Constitutional:      Appearance: Normal appearance.  HENT:     Head: Normocephalic and atraumatic.     Right Ear: Tympanic membrane normal.     Left Ear: Tympanic membrane normal.     Nose:     Comments: Bilateral nares slightly  erythematous with thin clear nasal drainage noted.  Pharynx normal.  Ears normal.  Eyes normal.    Mouth/Throat:     Pharynx: Oropharynx is clear.  Eyes:     Conjunctiva/sclera: Conjunctivae normal.  Cardiovascular:     Rate and Rhythm: Normal rate and regular rhythm.     Heart sounds: Normal heart sounds. No murmur heard. Pulmonary:     Effort: Pulmonary effort is normal.     Breath sounds: Normal breath sounds.     Comments: Lungs clear to auscultation Musculoskeletal:        General: Normal range of motion.     Cervical back: Normal range of motion and neck supple.  Skin:    General: Skin is warm and dry.     Comments: Scattered hives noted on her lower back and axilla.  No open areas or drainage noted.  Neurological:     Mental Status: She is alert and oriented to person, place, and time.  Psychiatric:        Mood and Affect: Mood normal.        Behavior: Behavior normal.        Thought Content: Thought content normal.        Judgment: Judgment normal.     Photo of lower back   Assessment and Plan: 1. Moderate persistent asthma without complication   2. Seasonal and perennial allergic rhinoconjunctivitis   3. Seasonal allergic conjunctivitis   4. Hives     Meds ordered this encounter  Medications   triamcinolone ointment (KENALOG) 0.1 %    Sig: Apply 1 Application topically 2 (two) times daily.    Dispense:  80 g    Refill:  2    Patient Instructions  Asthma Moderately well-controlled Restart Breztri 2 puffs twice a day with a spacer to prevent cough or wheeze Continue albuterol 2 puffs once every 4 hours as needed for cough or wheeze  Allergic rhinitis Moderately well-controlled Continue allergen avoidance directed toward grass pollen, weed pollen, tree pollen, mold, cat, dog, and dust mite as listed below Continue an antihistamine once a day as needed for a runny nose or itch. .  Antihistamines may cause drowsiness Continue Flonase 2 sprays in each  nostril once a day as needed for stuffy nose Consider saline nasal rinses as needed for nasal symptoms. Use this before any medicated nasal sprays for best result  Allergic conjunctivitis Not well-controlled Some over the counter eye drops include Pataday one drop in each eye once a day as needed for red, itchy eyes OR Zaditor one drop in each eye twice a day as needed for red itchy eyes. Avoid eye drops that say red eye relief as they may contain medications that dry out your eyes.   Hives Not well-controlled Consult with your oncologist about the development of hives after starting your new chemotherapy medication decitabine Continue an antihistamine once a day as needed for hives or itch.  You may take an additional  antihistamine once a day as needed for breakthrough symptoms.  Antihistamines may cause drowsiness Began triamcinolone up to twice a day to red and itchy areas underneath your face If no resolution at your follow-up visit, we will draw blood work for further evaluation  Call the clinic if this treatment plan is not working well for you.  Follow up in 1 month or sooner if needed.   No follow-ups on file.    Thank you for the opportunity to care for this patient.  Please do not hesitate to contact me with questions.  Thermon Leyland, FNP [facility]

## 2023-03-18 NOTE — Patient Instructions (Addendum)
Asthma Moderately well-controlled Restart Breztri 2 puffs twice a day with a spacer to prevent cough or wheeze Continue albuterol 2 puffs once every 4 hours as needed for cough or wheeze  Allergic rhinitis Moderately well-controlled Continue allergen avoidance directed toward grass pollen, weed pollen, tree pollen, mold, cat, dog, and dust mite as listed below Continue an antihistamine once a day as needed for a runny nose or itch. .  Antihistamines may cause drowsiness Continue Flonase 2 sprays in each nostril once a day as needed for stuffy nose Consider saline nasal rinses as needed for nasal symptoms. Use this before any medicated nasal sprays for best result  Allergic conjunctivitis Not well-controlled Some over the counter eye drops include Pataday one drop in each eye once a day as needed for red, itchy eyes OR Zaditor one drop in each eye twice a day as needed for red itchy eyes. Avoid eye drops that say red eye relief as they may contain medications that dry out your eyes.   Hives Not well-controlled Consult with your oncologist about the development of hives after starting your new chemotherapy medication decitabine Continue an antihistamine once a day as needed for hives or itch.  You may take an additional antihistamine once a day as needed for breakthrough symptoms.  Antihistamines may cause drowsiness Began triamcinolone up to twice a day to red and itchy areas underneath your face If no resolution at your follow-up visit, we will draw blood work for further evaluation  Call the clinic if this treatment plan is not working well for you.  Follow up in 1 month or sooner if needed.  Reducing Pollen Exposure The American Academy of All[employer], Asthma and [employer] suggests the following steps to reduce your exposure to pollen during allergy seasons. Do not hang sheets or clothing out to dry; pollen may collect on these items. Do not mow lawns or spend time around freshly cut  grass; mowing stirs up pollen. Keep windows closed at night.  Keep car windows closed while driving. Minimize morning activities outdoors, a time when pollen counts are usually at their highest. Stay indoors as much as possible when pollen counts or humidity is high and on windy days when pollen tends to remain in the air longer. Use air conditioning when possible.  Many air conditioners have filters that trap the pollen spores. Use a HEPA room air filter to remove pollen form the indoor air you breathe.  Control of Mold Allergen Mold and fungi can grow on a variety of surfaces provided certain temperature and moisture conditions exist.  Outdoor molds grow on plants, decaying vegetation and soil.  The major outdoor mold, Alternaria and Cladosporium, are found in very high numbers during hot and dry conditions.  Generally, a late Summer - Fall peak is seen for common outdoor fungal spores.  Rain will temporarily lower outdoor mold spore count, but counts rise rapidly when the rainy period ends.  The most important indoor molds are Aspergillus and Penicillium.  Dark, humid and poorly ventilated basements are ideal sites for mold growth.  The next most common sites of mold growth are the bathroom and the kitchen.  Outdoor [employer] Use air conditioning and keep windows closed Avoid exposure to decaying vegetation. Avoid leaf raking. Avoid grain handling. Consider wearing a face mask if working in moldy areas.  Indoor Mold Control Maintain humidity below 50%. Clean washable surfaces with 5% bleach solution. Remove sources e.g. Contaminated carpets.  Control of Dog or Cat Allergen Avoidance  is the best way to manage a dog or cat allergy. If you have a dog or cat and are allergic to dog or cats, consider removing the dog or cat from the home. If you have a dog or cat but don't want to find it a new home, or if your family wants a pet even though someone in the household is allergic, here are  some strategies that may help keep symptoms at bay:  Keep the pet out of your bedroom and restrict it to only a few rooms. Be advised that keeping the dog or cat in only one room will not limit the allergens to that room. Don't pet, hug or kiss the dog or cat; if you do, wash your hands with soap and water. High-efficiency particulate air (HEPA) cleaners run continuously in a bedroom or living room can reduce allergen levels over time. Regular use of a high-efficiency vacuum cleaner or a central vacuum can reduce allergen levels. Giving your dog or cat a bath at least once a week can reduce airborne allergen.   Control of Dust Mite Allergen Dust mites play a major role in allergic asthma and rhinitis. They occur in environments with high humidity wherever human skin is found. Dust mites absorb humidity from the atmosphere (ie, they do not drink) and feed on organic matter (including shed human and animal skin). Dust mites are a microscopic type of insect that you cannot see with the naked eye. High levels of dust mites have been detected from mattresses, pillows, carpets, upholstered furniture, bed covers, clothes, soft toys and any woven material. The principal allergen of the dust mite is found in its feces. A gram of dust may contain 1,000 mites and 250,000 fecal particles. Mite antigen is easily measured in the air during house cleaning activities. Dust mites do not bite and do not cause harm to humans, other than by triggering allergies/asthma.  Ways to decrease your exposure to dust mites in your home:  1. Encase mattresses, box springs and pillows with a mite-impermeable barrier or cover  2. Wash sheets, blankets and drapes weekly in hot water (130 F) with detergent and dry them in a dryer on the hot setting.  3. Have the room cleaned frequently with a vacuum cleaner and a damp dust-mop. For carpeting or rugs, vacuuming with a vacuum cleaner equipped with a high-efficiency particulate air  (HEPA) filter. The dust mite allergic individual should not be in a room which is being cleaned and should wait 1 hour after cleaning before going into the room.  4. Do not sleep on upholstered furniture (eg, couches).  5. If possible removing carpeting, upholstered furniture and drapery from the home is ideal. Horizontal blinds should be eliminated in the rooms where the person spends the most time (bedroom, study, television room). Washable vinyl, roller-type shades are optimal.  6. Remove all non-washable stuffed toys from the bedroom. Wash stuffed toys weekly like sheets and blankets above.  7. Reduce indoor humidity to less than 50%. Inexpensive humidity monitors can be purchased at most hardware stores. Do not use a humidifier as can make the problem worse and are not recommended.

## 2023-03-21 ENCOUNTER — Encounter: Payer: Self-pay | Admitting: Hematology and Oncology

## 2023-03-22 ENCOUNTER — Inpatient Hospital Stay: Payer: Medicare Other

## 2023-03-22 ENCOUNTER — Inpatient Hospital Stay (HOSPITAL_BASED_OUTPATIENT_CLINIC_OR_DEPARTMENT_OTHER): Payer: Medicare Other | Admitting: Physician Assistant

## 2023-03-22 ENCOUNTER — Inpatient Hospital Stay: Payer: Medicare Other | Attending: Hematology and Oncology

## 2023-03-22 ENCOUNTER — Encounter: Payer: Self-pay | Admitting: Hematology and Oncology

## 2023-03-22 ENCOUNTER — Other Ambulatory Visit: Payer: Self-pay

## 2023-03-22 VITALS — BP 129/65 | HR 88 | Temp 98.2°F | Resp 16 | Ht 68.0 in | Wt 185.7 lb

## 2023-03-22 VITALS — BP 107/62 | HR 73 | Temp 99.0°F | Resp 16

## 2023-03-22 DIAGNOSIS — Z5111 Encounter for antineoplastic chemotherapy: Secondary | ICD-10-CM | POA: Insufficient documentation

## 2023-03-22 DIAGNOSIS — D46Z Other myelodysplastic syndromes: Secondary | ICD-10-CM | POA: Diagnosis not present

## 2023-03-22 DIAGNOSIS — R300 Dysuria: Secondary | ICD-10-CM

## 2023-03-22 DIAGNOSIS — D696 Thrombocytopenia, unspecified: Secondary | ICD-10-CM | POA: Insufficient documentation

## 2023-03-22 DIAGNOSIS — R42 Dizziness and giddiness: Secondary | ICD-10-CM | POA: Diagnosis not present

## 2023-03-22 DIAGNOSIS — Z8261 Family history of arthritis: Secondary | ICD-10-CM | POA: Diagnosis not present

## 2023-03-22 DIAGNOSIS — R233 Spontaneous ecchymoses: Secondary | ICD-10-CM | POA: Insufficient documentation

## 2023-03-22 DIAGNOSIS — Z818 Family history of other mental and behavioral disorders: Secondary | ICD-10-CM | POA: Diagnosis not present

## 2023-03-22 DIAGNOSIS — Z9089 Acquired absence of other organs: Secondary | ICD-10-CM | POA: Diagnosis not present

## 2023-03-22 DIAGNOSIS — D469 Myelodysplastic syndrome, unspecified: Secondary | ICD-10-CM | POA: Diagnosis present

## 2023-03-22 DIAGNOSIS — Z87440 Personal history of urinary (tract) infections: Secondary | ICD-10-CM | POA: Insufficient documentation

## 2023-03-22 DIAGNOSIS — N390 Urinary tract infection, site not specified: Secondary | ICD-10-CM | POA: Insufficient documentation

## 2023-03-22 DIAGNOSIS — Z79631 Long term (current) use of antimetabolite agent: Secondary | ICD-10-CM | POA: Diagnosis not present

## 2023-03-22 DIAGNOSIS — R21 Rash and other nonspecific skin eruption: Secondary | ICD-10-CM | POA: Diagnosis not present

## 2023-03-22 DIAGNOSIS — Z87891 Personal history of nicotine dependence: Secondary | ICD-10-CM | POA: Diagnosis not present

## 2023-03-22 DIAGNOSIS — Z825 Family history of asthma and other chronic lower respiratory diseases: Secondary | ICD-10-CM | POA: Diagnosis not present

## 2023-03-22 DIAGNOSIS — D61818 Other pancytopenia: Secondary | ICD-10-CM | POA: Diagnosis not present

## 2023-03-22 DIAGNOSIS — Z79899 Other long term (current) drug therapy: Secondary | ICD-10-CM | POA: Insufficient documentation

## 2023-03-22 DIAGNOSIS — J4540 Moderate persistent asthma, uncomplicated: Secondary | ICD-10-CM | POA: Diagnosis not present

## 2023-03-22 LAB — CBC WITH DIFFERENTIAL (CANCER CENTER ONLY)
Abs Immature Granulocytes: 0.01 10*3/uL (ref 0.00–0.07)
Basophils Absolute: 0 10*3/uL (ref 0.0–0.1)
Basophils Relative: 0 %
Eosinophils Absolute: 0 10*3/uL (ref 0.0–0.5)
Eosinophils Relative: 1 %
HCT: 28.3 % — ABNORMAL LOW (ref 36.0–46.0)
Hemoglobin: 9.4 g/dL — ABNORMAL LOW (ref 12.0–15.0)
Immature Granulocytes: 1 %
Lymphocytes Relative: 80 %
Lymphs Abs: 1.2 10*3/uL (ref 0.7–4.0)
MCH: 31.6 pg (ref 26.0–34.0)
MCHC: 33.2 g/dL (ref 30.0–36.0)
MCV: 95.3 fL (ref 80.0–100.0)
Monocytes Absolute: 0 10*3/uL — ABNORMAL LOW (ref 0.1–1.0)
Monocytes Relative: 3 %
Neutro Abs: 0.2 10*3/uL — CL (ref 1.7–7.7)
Neutrophils Relative %: 15 %
Platelet Count: 9 10*3/uL — CL (ref 150–400)
RBC: 2.97 MIL/uL — ABNORMAL LOW (ref 3.87–5.11)
RDW: 18.8 % — ABNORMAL HIGH (ref 11.5–15.5)
Smear Review: DECREASED
WBC Count: 1.5 10*3/uL — ABNORMAL LOW (ref 4.0–10.5)
nRBC: 0 % (ref 0.0–0.2)

## 2023-03-22 LAB — CMP (CANCER CENTER ONLY)
ALT: 7 U/L (ref 0–44)
AST: 17 U/L (ref 15–41)
Albumin: 3.8 g/dL (ref 3.5–5.0)
Alkaline Phosphatase: 57 U/L (ref 38–126)
Anion gap: 5 (ref 5–15)
BUN: 9 mg/dL (ref 8–23)
CO2: 26 mmol/L (ref 22–32)
Calcium: 9.1 mg/dL (ref 8.9–10.3)
Chloride: 106 mmol/L (ref 98–111)
Creatinine: 0.42 mg/dL — ABNORMAL LOW (ref 0.44–1.00)
GFR, Estimated: >60 mL/min (ref >60)
Glucose, Bld: 95 mg/dL (ref 70–99)
Potassium: 3.9 mmol/L (ref 3.5–5.1)
Sodium: 137 mmol/L (ref 135–145)
Total Bilirubin: 0.6 mg/dL (ref 0.3–1.2)
Total Protein: 6.8 g/dL (ref 6.5–8.1)

## 2023-03-22 LAB — URINALYSIS, COMPLETE (UACMP) WITH MICROSCOPIC
Bilirubin Urine: NEGATIVE
Glucose, UA: NEGATIVE mg/dL
Hgb urine dipstick: NEGATIVE
Ketones, ur: NEGATIVE mg/dL
Nitrite: NEGATIVE
Protein, ur: NEGATIVE mg/dL
Specific Gravity, Urine: 1.017 (ref 1.005–1.030)
pH: 6 (ref 5.0–8.0)

## 2023-03-22 LAB — ABO/RH
ABO/RH(D): O POS
ABO/RH(D): O POS

## 2023-03-22 MED ORDER — SODIUM CHLORIDE 0.9% IV SOLUTION
250.0000 mL | Freq: Once | INTRAVENOUS | Status: AC
  Administered 2023-03-22: 250 mL via INTRAVENOUS

## 2023-03-22 MED ORDER — PREDNISONE 20 MG PO TABS: 20.0000 mg | ORAL_TABLET | Freq: Every day | ORAL | 0 refills | Status: AC

## 2023-03-22 MED ORDER — SULFAMETHOXAZOLE-TRIMETHOPRIM 800-160 MG PO TABS
1.0000 | ORAL_TABLET | Freq: Two times a day (BID) | ORAL | 0 refills | Status: AC
Start: 2023-03-22 — End: 2023-04-01

## 2023-03-22 MED ORDER — HEPARIN SOD (PORK) LOCK FLUSH 100 UNIT/ML IV SOLN
500.0000 [IU] | Freq: Every day | INTRAVENOUS | Status: AC | PRN
  Administered 2023-03-22: 500 [IU]

## 2023-03-22 MED ORDER — SODIUM CHLORIDE 0.9% FLUSH
10.0000 mL | INTRAVENOUS | Status: AC | PRN
  Administered 2023-03-22: 10 mL

## 2023-03-22 MED ORDER — HYDROXYZINE PAMOATE 25 MG PO CAPS
25.0000 mg | ORAL_CAPSULE | Freq: Two times a day (BID) | ORAL | 0 refills | Status: DC | PRN
Start: 2023-03-22 — End: 2023-04-10

## 2023-03-22 NOTE — Progress Notes (Signed)
CRITICAL VALUE STICKER  CRITICAL VALUE: Platelets   RECEIVER (on-site recipient of call): [name] [name]  DATE & TIME NOTIFIED: 03/22/23, 1349  MESSENGER (representative from lab): Karle Starch  MD NOTIFIED: Namon Cirri, PA-C  TIME OF NOTIFICATION: 1350  RESPONSE: Possible platelet transfusion

## 2023-03-22 NOTE — Progress Notes (Signed)
macrobid    Symptom Management Consult Note  Cancer Center    Patient Care Team: [name] [name], [name], MD as PCP - General (Internal Medicine)    Name / MRN / DOB: [name] [name]  [id number]  22-Mar-1946   Date of visit: 03/22/2023   Chief Complaint/Reason for visit: rash, dysuria   Current Therapy: decitabine   Last treatment:  Day 5   Cycle 1 on 03/08/23   ASSESSMENT & PLAN: Patient is a 77 y.o. female with oncologic history of MDS followed by Dr. Al [name].  I have viewed most recent oncology note and lab work.    #MDS - Next appointment with oncologist is 04/01/23 - Scheduled for bone marrow transplant 03/25/23 at [facility]  #Rash -PE with some petechiae. Patient reporting hives although none visualized currently. Rash is tolerable per patient. -Patient has triamcinolone and benadryl gel which is helping. -Will prescribe vistaril to help with generalized pruritus. Will also prescribe short burst low dose prednisone to start in 3 days if pruritis is not well managed with other interventions.  #Pancytopenia -WBC 1.5, hemoglobin 9.4, thrombocytopenia 9K, ANC 0.2. -Abnormalities are related to MDS as well as treatment. -Patient will receive 1 unit of platelets today in clinic and return on Monday, 03/25/2023 for repeat labs and platelet infusion if needed. -Patient is afebrile.  Very strict neutropenic and bleeding precautions discussed with patient.  # Dysuria -UA is concerning for infection.  Urine culture collected as well. -Patient is afebrile, very well-appearing.  Abdominal exam is benign.  No CVA tenderness. -Patient with urinary symptoms last month that never completely resolved.  No culture was collected therefore unable to check susceptibilities. -Patient was on Cipro previously, prescription sent to pharmacy for Bactrim for complicated UTI.  Discussed with Dr. Al [name] who agrees with plan.  Strict ED precautions discussed should symptoms worsen.   Heme/Onc  History: Oncology History  MDS (myelodysplastic syndrome), high grade (HCC)  01/07/2023 Initial Biopsy   Bone marrow biopsy:cellular bone marrow with focal areas of hypercellularity (approximately up to  50%).  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  Blasts appear mildly mildly increased, approximately 5% to focally up to 10%.  Flow cytometric analysis reveals 5% CD34 positive blasts.  While the findings could be attributable to the patient's coexisting conditions, the possibility of a myeloid neoplasm such as myelodysplastic neoplasm with increased blasts cannot be entirely excluded. normal karyotype   01/29/2023 Initial Diagnosis   MDS (myelodysplastic syndrome), high grade (HCC)   02/04/2023 - 02/06/2023 Chemotherapy   Patient is on Treatment Plan : MYELODYSPLASIA  Azacitidine IV D1-5 q28d     03/04/2023 -  Chemotherapy   Patient is on Treatment Plan : MYELODYSPLASIA Decitabine D1-5 q28d         Interval history-: [name] [name] is a 77 y.o. female with oncologic history as above presenting to [facility] today with chief complaint of dysuria.  She presents unaccompanied to clinic today.  Patient states she has had dysuria x 3 days.  She might have urinary frequency although drinks plenty of water so it is difficult to discern if this is new or worse.  She denies any abdominal pain or back pain.  Denies any gross hematuria.  Patient has not had any fevers or chills.  Patient is also reporting a rash.  Started x 4 days ago.  She describes it as hives on her back and small red pinpricks on her abdomen, legs and palms.  She has associated pruritus.  She  has been applying a Benadryl cold gel as well as triamcinolone ointment.  She feels these are mildly helping.  She had a leftover Vistaril from a previous prescription that she took last night and believes helped her symptoms.  She reports she had a rash after her treatment with azacitidine is why she was switched to decitabine.  She states  this rash did not present until a week and a half after her first decitabine treatment.  Denies any environmental exposures that could be contributing to rash.    ROS  All other systems are reviewed and are negative for acute change except as noted in the HPI.    Allergies  Allergen Reactions   Latex Itching and Rash   Cat Hair Extract Other (See Comments)   Dust Mite Extract Other (See Comments)   Molds & Smuts     Other reaction(s): Unknown   Other     Other reaction(s): Unknown Other reaction(s): Unknown   Pollen Extract-Tree Extract [Pollen Extract] Other (See Comments)     Past Medical History:  Diagnosis Date   Asthma    Diverticulitis    Hypothyroid    Psoriatic arthritis (HCC)      Past Surgical History:  Procedure Laterality Date   COLONOSCOPY  08/2021   HIP SURGERY Bilateral    IR IMAGING GUIDED PORT INSERTION  02/20/2023   TONSILLECTOMY     TUBAL LIGATION      Social History   Socioeconomic History   Marital status: Widowed    Spouse name: Not on file   Number of children: Not on file   Years of education: Not on file   Highest education level: Not on file  Occupational History   Not on file  Tobacco Use   Smoking status: Former    Current packs/day: 0.00    Average packs/day: 0.1 packs/day for 33.4 years (3.3 ttl pk-yrs)    Types: Cigarettes    Start date: 69    Quit date: 11/18/2000    Years since quitting: 22.3   Smokeless tobacco: Never   Tobacco comments:    3 cigarettes a day  Vaping Use   Vaping status: Never Used  Substance and Sexual Activity   Alcohol use: Never   Drug use: Never   Sexual activity: Not Currently  Other Topics Concern   Not on file  Social History Narrative   Lives with children   Social Determinants of Health   Financial Resource Strain: Not on file  Food Insecurity: No Food Insecurity (11/24/2022)   Hunger Vital Sign    Worried About Running Out of Food in the Last Year: Never true    Ran Out of Food in  the Last Year: Never true  Transportation Needs: No Transportation Needs (11/24/2022)   PRAPARE - Administrator, Civil Service (Medical): No    Lack of Transportation (Non-Medical): No  Physical Activity: Not on file  Stress: Not on file  Social Connections: Not on file  Intimate Partner Violence: Not At Risk (11/24/2022)   Humiliation, Afraid, Rape, and Kick questionnaire    Fear of Current or Ex-Partner: No    Emotionally Abused: No    Physically Abused: No    Sexually Abused: No    Family History  Problem Relation Age of Onset   Asthma Mother    Allergic rhinitis Mother    Rheum arthritis Mother    Allergic rhinitis Father    Alzheimer's disease Father    Eczema  Brother    Allergic rhinitis Brother    Colon cancer Neg Hx    Esophageal cancer Neg Hx    Stomach cancer Neg Hx    Rectal cancer Neg Hx      Current Outpatient Medications:    hydrOXYzine (VISTARIL) 25 MG capsule, Take 1 capsule (25 mg total) by mouth 2 (two) times daily as needed., Disp: 30 capsule, Rfl: 0   predniSONE (DELTASONE) 20 MG tablet, Take 1 tablet (20 mg total) by mouth daily with breakfast for 5 days., Disp: 5 tablet, Rfl: 0   sulfamethoxazole-trimethoprim (BACTRIM DS) 800-160 MG tablet, Take 1 tablet by mouth 2 (two) times daily for 10 days., Disp: 20 tablet, Rfl: 0   albuterol (VENTOLIN HFA) 108 (90 Base) MCG/ACT inhaler, Inhale 2 puffs into the lungs every 6 (six) hours as needed for wheezing or shortness of breath., Disp: 8 g, Rfl: 6   Azelastine-Fluticasone 137-50 MCG/ACT SUSP, Place 1 spray into the nose 2 (two) times daily as needed., Disp: 23 g, Rfl: 5   b complex vitamins tablet, Take 1 tablet by mouth daily., Disp: , Rfl:    budesonide-formoterol (SYMBICORT) 80-4.5 MCG/ACT inhaler, Inhale 2 puffs into the lungs in the morning and at bedtime., Disp: 1 each, Rfl: 5   Cholecalciferol (VITAMIN D3) 25 MCG (1000 UT) CAPS, Take by mouth., Disp: , Rfl:    ciprofloxacin (CIPRO) 250 MG tablet,  Take 1 tablet (250 mg total) by mouth 2 (two) times daily., Disp: 10 tablet, Rfl: 0   EPINEPHrine 0.3 mg/0.3 mL IJ SOAJ injection, Inject 0.3 mg into the muscle as needed for anaphylaxis., Disp: 2 each, Rfl: 2   famotidine (PEPCID) 40 MG tablet, Take 1 tablet (40 mg total) by mouth daily., Disp: 30 tablet, Rfl: 5   gabapentin (NEURONTIN) 800 MG tablet, 1 tablet, Disp: , Rfl:    Golimumab (SIMPONI ARIA IV), Inject 206.4 mg into the vein as directed. Once every 8 weeks, Disp: , Rfl:    influenza vac split quadrivalent PF (FLUARIX) 0.5 ML injection, ADM 0.5ML IM UTD, Disp: , Rfl:    ketoconazole (NIZORAL) 2 % cream, Apply to bottoms of feet for 2-3 weeks, Disp: 60 g, Rfl: 2   levothyroxine (SYNTHROID) 100 MCG tablet, Take 100 mcg by mouth every morning., Disp: , Rfl:    Magnesium 250 MG TABS, Take 250 mg by mouth daily., Disp: , Rfl:    montelukast (SINGULAIR) 10 MG tablet, TAKE 1 TABLET(10 MG) BY MOUTH AT BEDTIME, Disp: 30 tablet, Rfl: 3   neomycin-polymyxin b-dexamethasone (MAXITROL) 3.5-10000-0.1 OINT, SMARTSIG:sparingly Left Eye Twice Daily, Disp: , Rfl:    pantoprazole (PROTONIX) 40 MG tablet, TAKE 1 TABLET(40 MG) BY MOUTH TWICE DAILY BEFORE A MEAL, Disp: 180 tablet, Rfl: 0   traMADol (ULTRAM) 50 MG tablet, , Disp: , Rfl:    triamcinolone ointment (KENALOG) 0.1 %, Apply 1 Application topically 2 (two) times daily., Disp: 80 g, Rfl: 2  Current Facility-Administered Medications:    0.9 %  sodium chloride infusion, 500 mL, Intravenous, Once, Tressia Danas, MD   omalizumab Geoffry Paradise) injection 375 mg, 375 mg, Subcutaneous, Q14 Days, Ellamae Sia, DO, 375 mg at 04/24/21 1516   omalizumab Geoffry Paradise) prefilled syringe 375 mg, 375 mg, Subcutaneous, Q14 Days, Alfonse Spruce, MD, 375 mg at 05/15/21 1017  Facility-Administered Medications Ordered in Other Visits:    0.9 %  sodium chloride infusion (Manually program via Guardrails IV Fluids), 250 mL, Intravenous, Once, Rachel Moulds, MD   heparin  lock  flush 100 unit/mL, 500 Units, Intracatheter, Daily PRN, Iruku, Praveena, MD   sodium chloride flush (NS) 0.9 % injection 10 mL, 10 mL, Intracatheter, PRN, Iruku, Praveena, MD  PHYSICAL EXAM: ECOG FS:1 - Symptomatic but completely ambulatory    Vitals:   03/22/23 1407  BP: 129/65  Pulse: 88  Resp: 16  Temp: 98.2 F (36.8 C)  TempSrc: Oral  SpO2: 98%  Weight: 185 lb 11.2 oz (84.2 kg)  Height: 5\' 8"  (1.727 m)   Physical Exam Vitals and nursing note reviewed.  Constitutional:      Appearance: She is not ill-appearing or toxic-appearing.  HENT:     Head: Normocephalic.  Eyes:     Conjunctiva/sclera: Conjunctivae normal.  Cardiovascular:     Rate and Rhythm: Normal rate and regular rhythm.     Pulses: Normal pulses.     Heart sounds: Normal heart sounds.  Pulmonary:     Effort: Pulmonary effort is normal.     Breath sounds: Normal breath sounds.  Abdominal:     General: There is no distension.     Tenderness: There is no abdominal tenderness. There is no right CVA tenderness, left CVA tenderness or guarding.  Musculoskeletal:     Cervical back: Normal range of motion.  Skin:    General: Skin is warm and dry.     Findings: Rash present.     Comments: Petechiae   Neurological:     Mental Status: She is alert.        LABORATORY DATA: I have reviewed the data as listed    Latest Ref Rng & Units 03/22/2023    1:11 PM 03/04/2023    1:08 PM 02/21/2023    3:04 PM  CBC  WBC 4.0 - 10.5 K/uL 1.5  2.3  2.8   Hemoglobin 12.0 - 15.0 g/dL 9.4  16.1  09.6   Hematocrit 36.0 - 46.0 % 28.3  34.7  36.9   Platelets 150 - 400 K/uL 9  96  79         Latest Ref Rng & Units 03/22/2023    1:11 PM 03/04/2023    1:08 PM 02/04/2023    1:28 PM  CMP  Glucose 70 - 99 mg/dL 95  88  95   BUN 8 - 23 mg/dL 9  10  14    Creatinine 0.44 - 1.00 mg/dL 0.45  4.09  8.11   Sodium 135 - 145 mmol/L 137  140  136   Potassium 3.5 - 5.1 mmol/L 3.9  3.7  3.7   Chloride 98 - 111 mmol/L 106  107  103    CO2 22 - 32 mmol/L 26  26  24    Calcium 8.9 - 10.3 mg/dL 9.1  8.8  8.9   Total Protein 6.5 - 8.1 g/dL 6.8  7.2    Total Bilirubin 0.3 - 1.2 mg/dL 0.6  0.6    Alkaline Phos 38 - 126 U/L 57  50    AST 15 - 41 U/L 17  18    ALT 0 - 44 U/L 7  9         RADIOGRAPHIC STUDIES (from last 24 hours if applicable) I have personally reviewed the radiological images as listed and agreed with the findings in the report. No results found.      Visit Diagnosis: 1. MDS (myelodysplastic syndrome), high grade (HCC)   2. Dysuria   3. Other pancytopenia (HCC)      No orders of the defined types  were placed in this encounter.   All questions were answered. The patient knows to call the clinic with any problems, questions or concerns. No barriers to learning was detected.  A total of more than 30 minutes were spent on this encounter with face-to-face time and non-face-to-face time, including preparing to see the patient, ordering tests and/or medications, counseling the patient and coordination of care as outlined above.    Thank you for allowing me to participate in the care of this patient.    Shanon Ace, PA-C Department of Hematology/Oncology [facility] at [facility] Phone: [phone]  [phone]    03/22/2023 4:01 PM

## 2023-03-22 NOTE — Patient Instructions (Signed)
Platelet Transfusion A platelet transfusion is a procedure in which a person receives donated platelets through an IV. Platelets are parts of blood that stick together and form a clot to help the body stop bleeding after an injury. If you have too few platelets, your blood may have trouble clotting. This may cause you to bleed and bruise very easily. You may need a platelet transfusion if you have a condition that causes a low number of platelets (thrombocytopenia). A platelet transfusion may be used to stop or prevent excessive bleeding. Tell a health care provider about: Any reactions you have had during previous transfusions. Any allergies you have. All medicines you are taking, including vitamins, herbs, eye drops, creams, and over-the-counter medicines. Any bleeding problems you have. Any surgeries you have had. Any medical conditions you have. Whether you are pregnant or may be pregnant. What are the risks? Generally, this is a safe procedure. However, problems may occur, including: Fever. Infection. Allergic reaction to the donated (donor) platelets. Your body's disease-fighting system (immune system) attacking the donor platelets (hemolytic reaction). This is rare. A rare reaction that causes lung damage (transfusion-related acute lung injury). What happens before the procedure? Medicines Ask your health care provider about: Changing or stopping your regular medicines. This is especially important if you are taking diabetes medicines or blood thinners. Taking medicines such as aspirin and ibuprofen. These medicines can thin your blood. Do not take these medicines unless your health care provider tells you to take them. Taking over-the-counter medicines, vitamins, herbs, and supplements. General instructions You will have a blood test to determine your blood type. Your blood type determines what kind of platelets you will be given. Follow instructions from your health care provider  about eating or drinking restrictions. If you have had an allergic reaction to a transfusion in the past, you may be given medicine to help prevent a reaction. Your temperature, blood pressure, pulse, and breathing will be monitored. What happens during the procedure?  An IV will be inserted into one of your veins. For your safety, two health care providers will verify your identity along with the donor platelets about to be infused. A bag of donor platelets will be connected to your IV. The platelets will flow into your bloodstream. This usually takes 30-60 minutes. Your temperature, blood pressure, pulse, and breathing will be monitored during the transfusion. This helps detect early signs of any reaction. You will also be monitored for other symptoms that may indicate a reaction, including chills, hives, or itching. If you have signs of a reaction at any time, your transfusion will be stopped, and you may be given medicine to help manage the reaction. When your transfusion is complete, your IV will be removed. Pressure may be applied to the IV site for a few minutes to stop any bleeding. The IV site will be covered with a bandage (dressing). The procedure may vary among health care providers and hospitals. What can I expect after the procedure? Your blood pressure, temperature, pulse, and breathing will be monitored until you leave the hospital or clinic. You may have some bruising and soreness at your IV site. Follow these instructions at home: Medicines Take over-the-counter and prescription medicines only as told by your health care provider. Talk with your health care provider before you take any medicines that contain aspirin or NSAIDs, such as ibuprofen. These medicines increase your risk for dangerous bleeding. IV site care Check your IV site every day for signs of infection. Check for:   Redness, swelling, or pain. Fluid or blood. If fluid or blood drains from your IV site, use your  hands to press down firmly on a bandage covering the area for a minute or two. Doing this should stop the bleeding. Warmth. Pus or a bad smell. General instructions Change or remove your dressing as told by your health care provider. Return to your normal activities as told by your health care provider. Ask your health care provider what activities are safe for you. Do not take baths, swim, or use a hot tub until your health care provider approves. Ask your health care provider if you may take showers. Keep all follow-up visits. This is important. Contact a health care provider if: You have a headache that does not go away with medicine. You have hives, rash, or itchy skin. You have nausea or vomiting. You feel unusually tired or weak. You have signs of infection at your IV site. Get help right away if: You have a fever or chills. You urinate less often than usual. Your urine is darker colored than normal. You have any of the following: Trouble breathing. Pain in your back, abdomen, or chest. Cool, clammy skin. A fast heartbeat. Summary Platelets are tiny pieces of blood cells that clump together to form a blood clot when you have an injury. If you have too few platelets, your blood may have trouble clotting. A platelet transfusion is a procedure in which you receive donated platelets through an IV. A platelet transfusion may be used to stop or prevent excessive bleeding. After the procedure, check your IV site every day for signs of infection. This information is not intended to replace advice given to you by your health care provider. Make sure you discuss any questions you have with your health care provider. Document Revised: 12/08/2020 Document Reviewed: 12/08/2020 Elsevier Patient Education  2024 [employer]  

## 2023-03-22 NOTE — Patient Instructions (Signed)
If you have fever > 100.4 please go to the emergency department for evaluation.

## 2023-03-23 LAB — PREPARE PLATELET PHERESIS: Unit division: 0

## 2023-03-23 LAB — BPAM PLATELET PHERESIS
Blood Product Expiration Date: 202410082359
ISSUE DATE / TIME: 202410041605
Unit Type and Rh: 6200

## 2023-03-25 ENCOUNTER — Inpatient Hospital Stay: Payer: Medicare Other

## 2023-03-25 ENCOUNTER — Telehealth: Payer: Self-pay

## 2023-03-25 ENCOUNTER — Other Ambulatory Visit: Payer: Self-pay | Admitting: *Deleted

## 2023-03-25 DIAGNOSIS — D46Z Other myelodysplastic syndromes: Secondary | ICD-10-CM

## 2023-03-25 DIAGNOSIS — D696 Thrombocytopenia, unspecified: Secondary | ICD-10-CM

## 2023-03-25 DIAGNOSIS — Z5111 Encounter for antineoplastic chemotherapy: Secondary | ICD-10-CM | POA: Diagnosis not present

## 2023-03-25 LAB — URINE CULTURE

## 2023-03-25 LAB — CBC WITH DIFFERENTIAL/PLATELET
Abs Immature Granulocytes: 0 10*3/uL (ref 0.00–0.07)
Basophils Absolute: 0 10*3/uL (ref 0.0–0.1)
Basophils Relative: 1 %
Eosinophils Absolute: 0 10*3/uL (ref 0.0–0.5)
Eosinophils Relative: 3 %
HCT: 27.6 % — ABNORMAL LOW (ref 36.0–46.0)
Hemoglobin: 8.7 g/dL — ABNORMAL LOW (ref 12.0–15.0)
Immature Granulocytes: 0 %
Lymphocytes Relative: 86 %
Lymphs Abs: 1.3 10*3/uL (ref 0.7–4.0)
MCH: 30.2 pg (ref 26.0–34.0)
MCHC: 31.5 g/dL (ref 30.0–36.0)
MCV: 95.8 fL (ref 80.0–100.0)
Monocytes Absolute: 0 10*3/uL — ABNORMAL LOW (ref 0.1–1.0)
Monocytes Relative: 2 %
Neutro Abs: 0.1 10*3/uL — CL (ref 1.7–7.7)
Neutrophils Relative %: 8 %
Platelets: 15 10*3/uL — ABNORMAL LOW (ref 150–400)
RBC Morphology: 2
RBC: 2.88 MIL/uL — ABNORMAL LOW (ref 3.87–5.11)
RDW: 19.4 % — ABNORMAL HIGH (ref 11.5–15.5)
Smear Review: NORMAL
WBC: 1.4 10*3/uL — ABNORMAL LOW (ref 4.0–10.5)
nRBC: 0 % (ref 0.0–0.2)

## 2023-03-25 LAB — CMP (CANCER CENTER ONLY)
ALT: 7 U/L (ref 0–44)
AST: 16 U/L (ref 15–41)
Albumin: 3.7 g/dL (ref 3.5–5.0)
Alkaline Phosphatase: 48 U/L (ref 38–126)
Anion gap: 6 (ref 5–15)
BUN: 13 mg/dL (ref 8–23)
CO2: 25 mmol/L (ref 22–32)
Calcium: 8.7 mg/dL — ABNORMAL LOW (ref 8.9–10.3)
Chloride: 109 mmol/L (ref 98–111)
Creatinine: 0.46 mg/dL (ref 0.44–1.00)
GFR, Estimated: >60 mL/min (ref >60)
Glucose, Bld: 94 mg/dL (ref 70–99)
Potassium: 4.2 mmol/L (ref 3.5–5.1)
Sodium: 140 mmol/L (ref 135–145)
Total Bilirubin: 0.5 mg/dL (ref 0.3–1.2)
Total Protein: 6.6 g/dL (ref 6.5–8.1)

## 2023-03-25 LAB — SAMPLE TO BLOOD BANK

## 2023-03-25 MED ORDER — SODIUM CHLORIDE 0.9% FLUSH
10.0000 mL | Freq: Once | INTRAVENOUS | Status: AC
  Administered 2023-03-25: 10 mL

## 2023-03-25 MED ORDER — DIPHENHYDRAMINE HCL 25 MG PO CAPS
25.0000 mg | ORAL_CAPSULE | Freq: Once | ORAL | Status: DC
  Filled 2023-03-25: qty 1

## 2023-03-25 MED ORDER — HEPARIN SOD (PORK) LOCK FLUSH 100 UNIT/ML IV SOLN
250.0000 [IU] | INTRAVENOUS | Status: AC | PRN
  Administered 2023-03-25: 500 [IU]

## 2023-03-25 MED ORDER — ACETAMINOPHEN 325 MG PO TABS
650.0000 mg | ORAL_TABLET | Freq: Once | ORAL | Status: AC
  Administered 2023-03-25: 650 mg via ORAL
  Filled 2023-03-25: qty 2

## 2023-03-25 MED ORDER — SODIUM CHLORIDE 0.9% FLUSH
10.0000 mL | INTRAVENOUS | Status: AC | PRN
  Administered 2023-03-25: 10 mL

## 2023-03-25 MED ORDER — SODIUM CHLORIDE 0.9% IV SOLUTION
250.0000 mL | Freq: Once | INTRAVENOUS | Status: AC
  Administered 2023-03-25: 250 mL via INTRAVENOUS

## 2023-03-25 NOTE — Telephone Encounter (Signed)
CRITICAL VALUE STICKER  CRITICAL VALUE: ANC 0.1  RECEIVER (on-site recipient of call):  Daneil Dolin, LPN  DATE & TIME NOTIFIED: 03/25/23  09:32  MESSENGER (representative from lab): Heather  MD NOTIFIED: Guss Bunde, MD  TIME OF NOTIFICATION:  09:39  RESPONSE:

## 2023-03-25 NOTE — Patient Instructions (Signed)
Platelet Transfusion A platelet transfusion is a procedure in which a person receives donated platelets through an IV. Platelets are parts of blood that stick together and form a clot to help the body stop bleeding after an injury. If you have too few platelets, your blood may have trouble clotting. This may cause you to bleed and bruise very easily. You may need a platelet transfusion if you have a condition that causes a low number of platelets (thrombocytopenia). A platelet transfusion may be used to stop or prevent excessive bleeding. Tell a health care provider about: Any reactions you have had during previous transfusions. Any allergies you have. All medicines you are taking, including vitamins, herbs, eye drops, creams, and over-the-counter medicines. Any bleeding problems you have. Any surgeries you have had. Any medical conditions you have. Whether you are pregnant or may be pregnant. What are the risks? Generally, this is a safe procedure. However, problems may occur, including: Fever. Infection. Allergic reaction to the donated (donor) platelets. Your body's disease-fighting system (immune system) attacking the donor platelets (hemolytic reaction). This is rare. A rare reaction that causes lung damage (transfusion-related acute lung injury). What happens before the procedure? Medicines Ask your health care provider about: Changing or stopping your regular medicines. This is especially important if you are taking diabetes medicines or blood thinners. Taking medicines such as aspirin and ibuprofen. These medicines can thin your blood. Do not take these medicines unless your health care provider tells you to take them. Taking over-the-counter medicines, vitamins, herbs, and supplements. General instructions You will have a blood test to determine your blood type. Your blood type determines what kind of platelets you will be given. Follow instructions from your health care provider  about eating or drinking restrictions. If you have had an allergic reaction to a transfusion in the past, you may be given medicine to help prevent a reaction. Your temperature, blood pressure, pulse, and breathing will be monitored. What happens during the procedure?  An IV will be inserted into one of your veins. For your safety, two health care providers will verify your identity along with the donor platelets about to be infused. A bag of donor platelets will be connected to your IV. The platelets will flow into your bloodstream. This usually takes 30-60 minutes. Your temperature, blood pressure, pulse, and breathing will be monitored during the transfusion. This helps detect early signs of any reaction. You will also be monitored for other symptoms that may indicate a reaction, including chills, hives, or itching. If you have signs of a reaction at any time, your transfusion will be stopped, and you may be given medicine to help manage the reaction. When your transfusion is complete, your IV will be removed. Pressure may be applied to the IV site for a few minutes to stop any bleeding. The IV site will be covered with a bandage (dressing). The procedure may vary among health care providers and hospitals. What can I expect after the procedure? Your blood pressure, temperature, pulse, and breathing will be monitored until you leave the hospital or clinic. You may have some bruising and soreness at your IV site. Follow these instructions at home: Medicines Take over-the-counter and prescription medicines only as told by your health care provider. Talk with your health care provider before you take any medicines that contain aspirin or NSAIDs, such as ibuprofen. These medicines increase your risk for dangerous bleeding. IV site care Check your IV site every day for signs of infection. Check for:   Redness, swelling, or pain. Fluid or blood. If fluid or blood drains from your IV site, use your  hands to press down firmly on a bandage covering the area for a minute or two. Doing this should stop the bleeding. Warmth. Pus or a bad smell. General instructions Change or remove your dressing as told by your health care provider. Return to your normal activities as told by your health care provider. Ask your health care provider what activities are safe for you. Do not take baths, swim, or use a hot tub until your health care provider approves. Ask your health care provider if you may take showers. Keep all follow-up visits. This is important. Contact a health care provider if: You have a headache that does not go away with medicine. You have hives, rash, or itchy skin. You have nausea or vomiting. You feel unusually tired or weak. You have signs of infection at your IV site. Get help right away if: You have a fever or chills. You urinate less often than usual. Your urine is darker colored than normal. You have any of the following: Trouble breathing. Pain in your back, abdomen, or chest. Cool, clammy skin. A fast heartbeat. Summary Platelets are tiny pieces of blood cells that clump together to form a blood clot when you have an injury. If you have too few platelets, your blood may have trouble clotting. A platelet transfusion is a procedure in which you receive donated platelets through an IV. A platelet transfusion may be used to stop or prevent excessive bleeding. After the procedure, check your IV site every day for signs of infection. This information is not intended to replace advice given to you by your health care provider. Make sure you discuss any questions you have with your health care provider. Document Revised: 12/08/2020 Document Reviewed: 12/08/2020 Elsevier Patient Education  2024 [employer]  

## 2023-03-26 LAB — PREPARE PLATELET PHERESIS: Unit division: 0

## 2023-03-26 LAB — BPAM PLATELET PHERESIS
Blood Product Expiration Date: 202410092359
ISSUE DATE / TIME: 202410070922
Unit Type and Rh: 6200

## 2023-03-27 ENCOUNTER — Telehealth: Payer: Self-pay | Admitting: Hematology and Oncology

## 2023-04-01 ENCOUNTER — Telehealth: Payer: Self-pay | Admitting: *Deleted

## 2023-04-01 ENCOUNTER — Inpatient Hospital Stay (HOSPITAL_BASED_OUTPATIENT_CLINIC_OR_DEPARTMENT_OTHER): Payer: Medicare Other | Admitting: Hematology and Oncology

## 2023-04-01 ENCOUNTER — Encounter: Payer: Self-pay | Admitting: Hematology and Oncology

## 2023-04-01 ENCOUNTER — Inpatient Hospital Stay: Payer: Medicare Other

## 2023-04-01 DIAGNOSIS — D46Z Other myelodysplastic syndromes: Secondary | ICD-10-CM | POA: Diagnosis not present

## 2023-04-01 DIAGNOSIS — Z5111 Encounter for antineoplastic chemotherapy: Secondary | ICD-10-CM | POA: Diagnosis not present

## 2023-04-01 DIAGNOSIS — D696 Thrombocytopenia, unspecified: Secondary | ICD-10-CM

## 2023-04-01 LAB — CBC WITH DIFFERENTIAL (CANCER CENTER ONLY)
Abs Immature Granulocytes: 0.01 10*3/uL (ref 0.00–0.07)
Basophils Absolute: 0 10*3/uL (ref 0.0–0.1)
Basophils Relative: 1 %
Eosinophils Absolute: 0.1 10*3/uL (ref 0.0–0.5)
Eosinophils Relative: 6 %
HCT: 29 % — ABNORMAL LOW (ref 36.0–46.0)
Hemoglobin: 8.8 g/dL — ABNORMAL LOW (ref 12.0–15.0)
Immature Granulocytes: 1 %
Lymphocytes Relative: 73 %
Lymphs Abs: 0.9 10*3/uL (ref 0.7–4.0)
MCH: 29.9 pg (ref 26.0–34.0)
MCHC: 30.3 g/dL (ref 30.0–36.0)
MCV: 98.6 fL (ref 80.0–100.0)
Monocytes Absolute: 0 10*3/uL — ABNORMAL LOW (ref 0.1–1.0)
Monocytes Relative: 3 %
Neutro Abs: 0.2 10*3/uL — CL (ref 1.7–7.7)
Neutrophils Relative %: 16 %
Platelet Count: 65 10*3/uL — ABNORMAL LOW (ref 150–400)
RBC: 2.94 MIL/uL — ABNORMAL LOW (ref 3.87–5.11)
RDW: 19.9 % — ABNORMAL HIGH (ref 11.5–15.5)
Smear Review: DECREASED
WBC Count: 1.2 10*3/uL — ABNORMAL LOW (ref 4.0–10.5)
nRBC: 0 % (ref 0.0–0.2)

## 2023-04-01 LAB — CMP (CANCER CENTER ONLY)
ALT: 7 U/L (ref 0–44)
AST: 15 U/L (ref 15–41)
Albumin: 3.6 g/dL (ref 3.5–5.0)
Alkaline Phosphatase: 52 U/L (ref 38–126)
Anion gap: 5 (ref 5–15)
BUN: 10 mg/dL (ref 8–23)
CO2: 25 mmol/L (ref 22–32)
Calcium: 9 mg/dL (ref 8.9–10.3)
Chloride: 108 mmol/L (ref 98–111)
Creatinine: 0.58 mg/dL (ref 0.44–1.00)
GFR, Estimated: >60 mL/min (ref >60)
Glucose, Bld: 101 mg/dL — ABNORMAL HIGH (ref 70–99)
Potassium: 3.9 mmol/L (ref 3.5–5.1)
Sodium: 138 mmol/L (ref 135–145)
Total Bilirubin: 0.5 mg/dL (ref 0.3–1.2)
Total Protein: 6.8 g/dL (ref 6.5–8.1)

## 2023-04-01 LAB — TYPE AND SCREEN
ABO/RH(D): O POS
Antibody Screen: NEGATIVE

## 2023-04-01 MED ORDER — PROCHLORPERAZINE MALEATE 10 MG PO TABS
10.0000 mg | ORAL_TABLET | Freq: Once | ORAL | Status: AC
  Administered 2023-04-01: 10 mg via ORAL

## 2023-04-01 MED ORDER — SODIUM CHLORIDE 0.9% FLUSH
10.0000 mL | INTRAVENOUS | Status: DC | PRN
  Administered 2023-04-01: 10 mL

## 2023-04-01 MED ORDER — PREDNISONE 10 MG PO TABS: 10.0000 mg | ORAL_TABLET | Freq: Every day | ORAL | 1 refills | Status: DC

## 2023-04-01 MED ORDER — SODIUM CHLORIDE 0.9 % IV SOLN
20.0000 mg/m2 | Freq: Once | INTRAVENOUS | Status: AC
  Administered 2023-04-01: 40 mg via INTRAVENOUS
  Filled 2023-04-01: qty 8

## 2023-04-01 MED ORDER — SODIUM CHLORIDE 0.9% FLUSH
10.0000 mL | Freq: Once | INTRAVENOUS | Status: AC
  Administered 2023-04-01: 10 mL

## 2023-04-01 MED ORDER — SODIUM CHLORIDE 0.9 % IV SOLN: Freq: Once | INTRAVENOUS | Status: AC

## 2023-04-01 MED ORDER — HEPARIN SOD (PORK) LOCK FLUSH 100 UNIT/ML IV SOLN
500.0000 [IU] | Freq: Once | INTRAVENOUS | Status: AC | PRN
  Administered 2023-04-01: 500 [IU]

## 2023-04-01 NOTE — Telephone Encounter (Signed)
CRITICAL VALUE STICKER  CRITICAL VALUE: ANC 0.2  RECEIVER (on-site recipient of call): Creola Corn, RN  DATE & TIME NOTIFIED: 2:25n pm 04/01/2023  MESSENGER (representative from lab): Cordelia Pen  MD NOTIFIED: Dr Al Pimple  TIME OF NOTIFICATION:2:27 pm  RESPONSE:  nurse and provider will communicate changes

## 2023-04-01 NOTE — Progress Notes (Signed)
[facility] Cancer Follow up:    [name] [name], [name], MD [address] [place] [address]   DIAGNOSIS: Myelodysplastic Syndrome  SUMMARY OF ONCOLOGIC HISTORY: Oncology History  MDS (myelodysplastic syndrome), high grade (HCC)  01/07/2023 Initial Biopsy   Bone marrow biopsy:cellular bone marrow with focal areas of hypercellularity (approximately up to  50%).  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  Blasts appear mildly mildly increased, approximately 5% to focally up to 10%.  Flow cytometric analysis reveals 5% CD34 positive blasts.  While the findings could be attributable to the patient's coexisting conditions, the possibility of a myeloid neoplasm such as myelodysplastic neoplasm with increased blasts cannot be entirely excluded. normal karyotype   01/29/2023 Initial Diagnosis   MDS (myelodysplastic syndrome), high grade (HCC)   02/04/2023 - 02/06/2023 Chemotherapy   Patient is on Treatment Plan : MYELODYSPLASIA  Azacitidine IV D1-5 q28d     03/04/2023 -  Chemotherapy   Patient is on Treatment Plan : MYELODYSPLASIA Decitabine D1-5 q28d       CURRENT THERAPY: Azacitidine   [name] [name] 77 y.o. female returns for follow-up after receiving azacitidine.  Unfortunately 2 to 3 days into the azacitidine she developed a rash and azacitidine was held.  She was switched to decitabine.  Discussed the use of AI scribe software for clinical note transcription with the patient, who gave verbal consent to proceed.  History of Present Illness    The patient, with a history of myelodysplasia syndrome, presents with a recurring rash that has changed in appearance and location. Initially, the rash presented as small dots, but has now evolved into larger, visible patches that are itchy but not as "fiery" as before. The rash seems to migrate, having previously been on the palms of the hands and now appearing on the trunk of the body. The patient also reports a knot in the  back of her leg that has been present for approximately a month and a half. The knot is firm and has not increased in size. The patient also reports increased fatigue over the past few days. The patient has a history of urinary tract infection but reports no current symptoms.  The patient recently consulted with a hematologist regarding a potential bone marrow transplant for myelodysplasia syndrome. The patient is hesitant about the transplant due to her age and the potential side effects, including long-term immunosuppression and isolation. The patient is currently considering her options and has not made a decision regarding the transplant.  Patient Active Problem List   Diagnosis Date Noted   Hives 03/18/2023   MDS (myelodysplastic syndrome), high grade (HCC) 01/29/2023   Obesity 03/02/2021   Other long term (current) drug therapy 03/02/2021   Primary osteoarthritis 03/02/2021   SOB (shortness of breath) 12/08/2020   Pericardial effusion 12/08/2020   Dry eyes/dry mouth 05/25/2019   Bronchitis, mucopurulent recurrent (HCC) 02/26/2019   Moderate persistent asthma without complication 02/24/2019   Perennial and seasonal allergic rhinitis 02/24/2019   Seasonal allergic conjunctivitis 02/24/2019   Primary osteoarthritis of both knees 04/10/2016   Acute chest pain 08/19/2014   Costochondritis 08/19/2014   Lung nodule 08/19/2014   HTN (hypertension) 08/07/2012   HLD (hyperlipidemia) 09/26/2011   Asthma 09/06/2010   Depression 09/06/2010   History of allergy 09/06/2010   Hypothyroidism 09/06/2010   Lower back pain 09/06/2010   Arthritis 09/06/2010   Pain in joint, pelvic region and thigh 09/06/2010   Peripheral neuropathy 09/06/2010   Psoriasis 09/06/2010  Therapeutic drug monitoring 09/06/2010   Tinnitus 09/06/2010   Vitamin B 12 deficiency 09/06/2010   Cervical spondylosis without myelopathy 04/24/2006   Tear of lateral cartilage or meniscus of knee, current 03/13/2006    is  allergic to latex, cat hair extract, dust mite extract, molds & smuts, other, and pollen extract-tree extract [pollen extract].  MEDICAL HISTORY: Past Medical History:  Diagnosis Date   Asthma    Diverticulitis    Hypothyroid    Psoriatic arthritis (HCC)     SURGICAL HISTORY: Past Surgical History:  Procedure Laterality Date   COLONOSCOPY  08/2021   HIP SURGERY Bilateral    IR IMAGING GUIDED PORT INSERTION  02/20/2023   TONSILLECTOMY     TUBAL LIGATION      SOCIAL HISTORY: Social History   Socioeconomic History   Marital status: Widowed    Spouse name: Not on file   Number of children: Not on file   Years of education: Not on file   Highest education level: Not on file  Occupational History   Not on file  Tobacco Use   Smoking status: Former    Current packs/day: 0.00    Average packs/day: 0.1 packs/day for 33.4 years (3.3 ttl pk-yrs)    Types: Cigarettes    Start date: 60    Quit date: 11/18/2000    Years since quitting: 22.3   Smokeless tobacco: Never   Tobacco comments:    3 cigarettes a day  Vaping Use   Vaping status: Never Used  Substance and Sexual Activity   Alcohol use: Never   Drug use: Never   Sexual activity: Not Currently  Other Topics Concern   Not on file  Social History Narrative   Lives with children   Social Determinants of Health   Financial Resource Strain: Not on file  Food Insecurity: No Food Insecurity (11/24/2022)   Hunger Vital Sign    Worried About Running Out of Food in the Last Year: Never true    Ran Out of Food in the Last Year: Never true  Transportation Needs: No Transportation Needs (11/24/2022)   PRAPARE - Administrator, Civil Service (Medical): No    Lack of Transportation (Non-Medical): No  Physical Activity: Not on file  Stress: Not on file  Social Connections: Not on file  Intimate Partner Violence: Not At Risk (11/24/2022)   Humiliation, Afraid, Rape, and Kick questionnaire    Fear of Current or  Ex-Partner: No    Emotionally Abused: No    Physically Abused: No    Sexually Abused: No    FAMILY HISTORY: Family History  Problem Relation Age of Onset   Asthma Mother    Allergic rhinitis Mother    Rheum arthritis Mother    Allergic rhinitis Father    Alzheimer's disease Father    Eczema Brother    Allergic rhinitis Brother    Colon cancer Neg Hx    Esophageal cancer Neg Hx    Stomach cancer Neg Hx    Rectal cancer Neg Hx     Review of Systems  Constitutional:  Negative for appetite change, chills, fatigue, fever and unexpected weight change.  HENT:   Negative for hearing loss, lump/mass and trouble swallowing.   Eyes:  Negative for eye problems and icterus.  Respiratory:  Negative for chest tightness, cough and shortness of breath.   Cardiovascular:  Negative for chest pain, leg swelling and palpitations.  Gastrointestinal:  Negative for abdominal distention, abdominal pain, constipation, diarrhea,  nausea and vomiting.  Endocrine: Negative for hot flashes.  Genitourinary:  Negative for difficulty urinating.   Musculoskeletal:  Negative for arthralgias.  Skin:  Negative for itching and rash.  Neurological:  Negative for dizziness, extremity weakness, headaches and numbness.  Hematological:  Negative for adenopathy. Does not bruise/bleed easily.  Psychiatric/Behavioral:  Negative for depression. The patient is not nervous/anxious.       PHYSICAL EXAMINATION     Vitals:   04/01/23 1338  BP: (!) 110/57  Pulse: 90  Resp: 18  Temp: (!) 97.3 F (36.3 C)  SpO2: 99%    HEENT: Oropharynx without lesions. CHEST: Breath sounds clear. ABDOMEN: Splenomegaly noted. EXTREMITIES: Firm mass on the back of the leg, [place] result pending. SKIN:  macular rash on trunk, flexural surfaces including groin and armpits,   LABORATORY DATA:  CBC    Component Value Date/Time   WBC 1.2 (L) 04/01/2023 1304   WBC 1.4 (L) 03/25/2023 0755   RBC 2.94 (L) 04/01/2023 1304   HGB 8.8 (L)  04/01/2023 1304   HCT 29.0 (L) 04/01/2023 1304   HCT 39.0 11/24/2022 1035   PLT 65 (L) 04/01/2023 1304   MCV 98.6 04/01/2023 1304   MCH 29.9 04/01/2023 1304   MCHC 30.3 04/01/2023 1304   RDW 19.9 (H) 04/01/2023 1304   LYMPHSABS 0.9 04/01/2023 1304   MONOABS 0.0 (L) 04/01/2023 1304   EOSABS 0.1 04/01/2023 1304   BASOSABS 0.0 04/01/2023 1304    CMP     Component Value Date/Time   NA 138 04/01/2023 1304   K 3.9 04/01/2023 1304   CL 108 04/01/2023 1304   CO2 25 04/01/2023 1304   GLUCOSE 101 (H) 04/01/2023 1304   BUN 10 04/01/2023 1304   CREATININE 0.58 04/01/2023 1304   CALCIUM 9.0 04/01/2023 1304   PROT 6.8 04/01/2023 1304   ALBUMIN 3.6 04/01/2023 1304   AST 15 04/01/2023 1304   ALT 7 04/01/2023 1304   ALKPHOS 52 04/01/2023 1304   BILITOT 0.5 04/01/2023 1304   GFRNONAA >60 04/01/2023 1304        ASSESSMENT and THERAPY PLAN:   MDS (myelodysplastic syndrome), high grade (HCC) This is a very pleasant 77 year old female patient with MDS, ASXL1 mutation, CBL, 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  4 points - IPSS-R Score Intermediate risk Median survival - 3 yrs Median time to 25% AML evolution: 3.2 years  01/07/2023: Bone marrow aspirate: Hypercellular bone marrow 50% cellularity with dysplasia, flow cytometry: 5% CD34 positive blasts, cytogenetics: Normal MDS [employer] panel showed ASXL , CBL, IDH2 and SRSF2.   She was treated with azacitidine but unfortunately 3 days into treatment developed a rash likely secondary to azacitidine.  She is now on decitabine.  Myelodysplasia Syndrome Discussed the risks and benefits of bone marrow transplant. Patient is hesitant due to age and potential quality of life impact. Current treatment appears to be working as platelet count has increased. -Continue current treatment regimen. -Consider bone marrow transplant if patient changes mind.  Rash Itchy rash present on trunk, groin, and armpits.  Different presentation than previous rash. Concern for potential medication-induced rash. -Start Prednisone 10mg  daily to control rash. -Schedule follow-up with dermatologist, Theador Hawthorne.  Soft Tissue Mass Firm mass present on back of leg for approximately 1.5 mos. Ultrasound performed but results pending. -Follow-up on ultrasound results.  General Health Maintenance -Continue weekly labs and follow-up appointments. -Next treatment scheduled for today (04/01/2023). -Schedule follow-up appointment for mid next week  to assess blood work and potential need for transfusions. RTC as scheduled.      All questions were answered. The patient knows to call the clinic with any problems, questions or concerns. We can certainly see the patient much sooner if necessary.  Total encounter time:30 minutes*in face-to-face visit time, chart review, lab review, care coordination, order entry, and documentation of the encounter time.    *Total Encounter Time as defined by the Centers for Medicare and Medicaid Services includes, in addition to the face-to-face time of a patient visit (documented in the note above) non-face-to-face time: obtaining and reviewing outside history, ordering and reviewing medications, tests or procedures, care coordination (communications with other health care professionals or caregivers) and documentation in the medical record.

## 2023-04-01 NOTE — Assessment & Plan Note (Signed)
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  4 points - IPSS-R Score Intermediate risk Median survival - 3 yrs Median time to 25% AML evolution: 3.2 years  01/07/2023: Bone marrow aspirate: Hypercellular bone marrow 50% cellularity with dysplasia, flow cytometry: 5% CD34 positive blasts, cytogenetics: Normal MDS [employer] panel showed ASXL , CBL, IDH2 and SRSF2.   She was treated with azacitidine but unfortunately 3 days into treatment developed a rash likely secondary to azacitidine.  She is now on decitabine.  Myelodysplasia Syndrome Discussed the risks and benefits of bone marrow transplant. Patient is hesitant due to age and potential quality of life impact. Current treatment appears to be working as platelet count has increased. -Continue current treatment regimen. -Consider bone marrow transplant if patient changes mind.  Rash Itchy rash present on trunk, groin, and armpits. Different presentation than previous rash. Concern for potential medication-induced rash. -Start Prednisone 10mg  daily to control rash. -Schedule follow-up with dermatologist, Theador Hawthorne.  Soft Tissue Mass Firm mass present on back of leg for approximately 1.5 mos. Ultrasound performed but results pending. -Follow-up on ultrasound results.  General Health Maintenance -Continue weekly labs and follow-up appointments. -Next treatment scheduled for today (04/01/2023). -Schedule follow-up appointment for mid next week to assess blood work and potential need for transfusions. RTC as scheduled.

## 2023-04-01 NOTE — Progress Notes (Signed)
Per MD, OK to treat today with 0.2 ANC and 65 platelet count. Jodie Echevaria, RN made aware.

## 2023-04-01 NOTE — Patient Instructions (Signed)
[place] [facility]  Discharge Instructions: Thank you for choosing [facility] to provide your oncology and hematology care.   If you have a lab appointment with the Cancer Center, please go directly to the Cancer Center and check in at the registration area.   Wear comfortable clothing and clothing appropriate for easy access to any Portacath or PICC line.   We strive to give you quality time with your provider. You may need to reschedule your appointment if you arrive late (15 or more minutes).  Arriving late affects you and other patients whose appointments are after yours.  Also, if you miss three or more appointments without notifying the office, you may be dismissed from the clinic at the provider's discretion.      For prescription refill requests, have your pharmacy contact our office and allow 72 hours for refills to be completed.    Today you received the following chemotherapy and/or immunotherapy agents: Dacogen      To help prevent nausea and vomiting after your treatment, we encourage you to take your nausea medication as directed.  BELOW ARE SYMPTOMS THAT SHOULD BE REPORTED IMMEDIATELY: *FEVER GREATER THAN 100.4 F (38 C) OR HIGHER *CHILLS OR SWEATING *NAUSEA AND VOMITING THAT IS NOT CONTROLLED WITH YOUR NAUSEA MEDICATION *UNUSUAL SHORTNESS OF BREATH *UNUSUAL BRUISING OR BLEEDING *URINARY PROBLEMS (pain or burning when urinating, or frequent urination) *BOWEL PROBLEMS (unusual diarrhea, constipation, pain near the anus) TENDERNESS IN MOUTH AND THROAT WITH OR WITHOUT PRESENCE OF ULCERS (sore throat, sores in mouth, or a toothache) UNUSUAL RASH, SWELLING OR PAIN  UNUSUAL VAGINAL DISCHARGE OR ITCHING   Items with * indicate a potential emergency and should be followed up as soon as possible or go to the Emergency Department if any problems should occur.  Please show the CHEMOTHERAPY ALERT CARD or IMMUNOTHERAPY ALERT CARD at  check-in to the Emergency Department and triage nurse.  Should you have questions after your visit or need to cancel or reschedule your appointment, please contact [place] [facility]  Dept: [phone]  and follow the prompts.  Office hours are 8:00 a.m. to 4:30 p.m. Monday - Friday. Please note that voicemails left after 4:00 p.m. may not be returned until the following business day.  We are closed weekends and major holidays. You have access to a nurse at all times for urgent questions. Please call the main number to the clinic Dept: [phone] and follow the prompts.   For any non-urgent questions, you may also contact your provider using MyChart. We now offer e-Visits for anyone 21 and older to request care online for non-urgent symptoms. For details visit [email or web address].   Also download the MyChart app! Go to the app store, search "MyChart", open the app, select , and log in with your MyChart username and password.

## 2023-04-02 ENCOUNTER — Inpatient Hospital Stay: Payer: Medicare Other

## 2023-04-02 ENCOUNTER — Telehealth: Payer: Self-pay

## 2023-04-02 ENCOUNTER — Encounter: Payer: Self-pay | Admitting: Hematology and Oncology

## 2023-04-02 VITALS — BP 109/74 | HR 88 | Temp 98.2°F | Resp 16

## 2023-04-02 DIAGNOSIS — Z5111 Encounter for antineoplastic chemotherapy: Secondary | ICD-10-CM | POA: Diagnosis not present

## 2023-04-02 DIAGNOSIS — D46Z Other myelodysplastic syndromes: Secondary | ICD-10-CM

## 2023-04-02 MED ORDER — SODIUM CHLORIDE 0.9% FLUSH
10.0000 mL | INTRAVENOUS | Status: DC | PRN
  Administered 2023-04-02: 10 mL

## 2023-04-02 MED ORDER — SODIUM CHLORIDE 0.9 % IV SOLN
20.0000 mg/m2 | Freq: Once | INTRAVENOUS | Status: AC
  Administered 2023-04-02: 40 mg via INTRAVENOUS
  Filled 2023-04-02: qty 8

## 2023-04-02 MED ORDER — HEPARIN SOD (PORK) LOCK FLUSH 100 UNIT/ML IV SOLN
500.0000 [IU] | Freq: Once | INTRAVENOUS | Status: AC | PRN
  Administered 2023-04-02: 500 [IU]

## 2023-04-02 MED ORDER — SODIUM CHLORIDE 0.9 % IV SOLN: Freq: Once | INTRAVENOUS | Status: AC

## 2023-04-02 MED ORDER — PROCHLORPERAZINE MALEATE 10 MG PO TABS
10.0000 mg | ORAL_TABLET | Freq: Once | ORAL | Status: AC
  Administered 2023-04-02: 10 mg via ORAL
  Filled 2023-04-02: qty 1

## 2023-04-02 NOTE — Telephone Encounter (Signed)
Call placed to [facility] to expedite a visit for rash. Receptionist states she will contact Dr Roderic Scarce for a work in and they will call pt for appt.

## 2023-04-02 NOTE — Patient Instructions (Signed)
[place] [facility]  Discharge Instructions: Thank you for choosing [facility] to provide your oncology and hematology care.   If you have a lab appointment with the Cancer Center, please go directly to the Cancer Center and check in at the registration area.   Wear comfortable clothing and clothing appropriate for easy access to any Portacath or PICC line.   We strive to give you quality time with your provider. You may need to reschedule your appointment if you arrive late (15 or more minutes).  Arriving late affects you and other patients whose appointments are after yours.  Also, if you miss three or more appointments without notifying the office, you may be dismissed from the clinic at the provider's discretion.      For prescription refill requests, have your pharmacy contact our office and allow 72 hours for refills to be completed.    Today you received the following chemotherapy and/or immunotherapy agents: Dacogen      To help prevent nausea and vomiting after your treatment, we encourage you to take your nausea medication as directed.  BELOW ARE SYMPTOMS THAT SHOULD BE REPORTED IMMEDIATELY: *FEVER GREATER THAN 100.4 F (38 C) OR HIGHER *CHILLS OR SWEATING *NAUSEA AND VOMITING THAT IS NOT CONTROLLED WITH YOUR NAUSEA MEDICATION *UNUSUAL SHORTNESS OF BREATH *UNUSUAL BRUISING OR BLEEDING *URINARY PROBLEMS (pain or burning when urinating, or frequent urination) *BOWEL PROBLEMS (unusual diarrhea, constipation, pain near the anus) TENDERNESS IN MOUTH AND THROAT WITH OR WITHOUT PRESENCE OF ULCERS (sore throat, sores in mouth, or a toothache) UNUSUAL RASH, SWELLING OR PAIN  UNUSUAL VAGINAL DISCHARGE OR ITCHING   Items with * indicate a potential emergency and should be followed up as soon as possible or go to the Emergency Department if any problems should occur.  Please show the CHEMOTHERAPY ALERT CARD or IMMUNOTHERAPY ALERT CARD at  check-in to the Emergency Department and triage nurse.  Should you have questions after your visit or need to cancel or reschedule your appointment, please contact [place] [facility]  Dept: [phone]  and follow the prompts.  Office hours are 8:00 a.m. to 4:30 p.m. Monday - Friday. Please note that voicemails left after 4:00 p.m. may not be returned until the following business day.  We are closed weekends and major holidays. You have access to a nurse at all times for urgent questions. Please call the main number to the clinic Dept: [phone] and follow the prompts.   For any non-urgent questions, you may also contact your provider using MyChart. We now offer e-Visits for anyone 97 and older to request care online for non-urgent symptoms. For details visit [email or web address].   Also download the MyChart app! Go to the app store, search "MyChart", open the app, select [place], and log in with your MyChart username and password.

## 2023-04-02 NOTE — Telephone Encounter (Signed)
-----   Message from [place] Iruku sent at 04/01/2023  2:02 PM EDT ----- Vernona Rieger  Pt has seen Dr Sebastian Ache from dermatology, She may need to be seen back by them, she has a skin rash. Pt tried calling but apparently couldn't get in for months. Can u speak to a nurse there. She is on decitabine for MDS.  Thanks,

## 2023-04-03 ENCOUNTER — Inpatient Hospital Stay: Payer: Medicare Other

## 2023-04-03 ENCOUNTER — Telehealth: Payer: Self-pay

## 2023-04-03 VITALS — BP 119/89 | HR 86 | Temp 98.5°F | Resp 16

## 2023-04-03 DIAGNOSIS — Z5111 Encounter for antineoplastic chemotherapy: Secondary | ICD-10-CM | POA: Diagnosis not present

## 2023-04-03 DIAGNOSIS — D46Z Other myelodysplastic syndromes: Secondary | ICD-10-CM

## 2023-04-03 MED ORDER — SODIUM CHLORIDE 0.9 % IV SOLN
20.0000 mg/m2 | Freq: Once | INTRAVENOUS | Status: AC
  Administered 2023-04-03: 40 mg via INTRAVENOUS
  Filled 2023-04-03: qty 8

## 2023-04-03 MED ORDER — PROCHLORPERAZINE MALEATE 10 MG PO TABS
10.0000 mg | ORAL_TABLET | Freq: Once | ORAL | Status: AC
  Administered 2023-04-03: 10 mg via ORAL
  Filled 2023-04-03: qty 1

## 2023-04-03 MED ORDER — SODIUM CHLORIDE 0.9% FLUSH
10.0000 mL | INTRAVENOUS | Status: DC | PRN
  Administered 2023-04-03: 10 mL

## 2023-04-03 MED ORDER — HEPARIN SOD (PORK) LOCK FLUSH 100 UNIT/ML IV SOLN
500.0000 [IU] | Freq: Once | INTRAVENOUS | Status: AC | PRN
  Administered 2023-04-03: 500 [IU]

## 2023-04-03 MED ORDER — SODIUM CHLORIDE 0.9 % IV SOLN: Freq: Once | INTRAVENOUS | Status: AC

## 2023-04-03 NOTE — Telephone Encounter (Signed)
Pt has M f/u scheduled 10/23 for further eval.

## 2023-04-03 NOTE — Telephone Encounter (Signed)
-----   Message from [place] Iruku sent at 04/02/2023 12:54 PM EDT ----- Pt said the mass is smaller, so I might want to follow up in 2 weeks and see if it will continue to improve. If not, we can proceed with biopsy at that point.

## 2023-04-03 NOTE — Patient Instructions (Signed)
[place] [facility]  Discharge Instructions: Thank you for choosing [facility] to provide your oncology and hematology care.   If you have a lab appointment with the Cancer Center, please go directly to the Cancer Center and check in at the registration area.   Wear comfortable clothing and clothing appropriate for easy access to any Portacath or PICC line.   We strive to give you quality time with your provider. You may need to reschedule your appointment if you arrive late (15 or more minutes).  Arriving late affects you and other patients whose appointments are after yours.  Also, if you miss three or more appointments without notifying the office, you may be dismissed from the clinic at the provider's discretion.      For prescription refill requests, have your pharmacy contact our office and allow 72 hours for refills to be completed.    Today you received the following chemotherapy and/or immunotherapy agents: Dacogen.      To help prevent nausea and vomiting after your treatment, we encourage you to take your nausea medication as directed.  BELOW ARE SYMPTOMS THAT SHOULD BE REPORTED IMMEDIATELY: *FEVER GREATER THAN 100.4 F (38 C) OR HIGHER *CHILLS OR SWEATING *NAUSEA AND VOMITING THAT IS NOT CONTROLLED WITH YOUR NAUSEA MEDICATION *UNUSUAL SHORTNESS OF BREATH *UNUSUAL BRUISING OR BLEEDING *URINARY PROBLEMS (pain or burning when urinating, or frequent urination) *BOWEL PROBLEMS (unusual diarrhea, constipation, pain near the anus) TENDERNESS IN MOUTH AND THROAT WITH OR WITHOUT PRESENCE OF ULCERS (sore throat, sores in mouth, or a toothache) UNUSUAL RASH, SWELLING OR PAIN  UNUSUAL VAGINAL DISCHARGE OR ITCHING   Items with * indicate a potential emergency and should be followed up as soon as possible or go to the Emergency Department if any problems should occur.  Please show the CHEMOTHERAPY ALERT CARD or IMMUNOTHERAPY ALERT CARD at  check-in to the Emergency Department and triage nurse.  Should you have questions after your visit or need to cancel or reschedule your appointment, please contact [place] [facility]  Dept: [phone]  and follow the prompts.  Office hours are 8:00 a.m. to 4:30 p.m. Monday - Friday. Please note that voicemails left after 4:00 p.m. may not be returned until the following business day.  We are closed weekends and major holidays. You have access to a nurse at all times for urgent questions. Please call the main number to the clinic Dept: [phone] and follow the prompts.   For any non-urgent questions, you may also contact your provider using MyChart. We now offer e-Visits for anyone 18 and older to request care online for non-urgent symptoms. For details visit [email or web address].   Also download the MyChart app! Go to the app store, search "MyChart", open the app, select [place], and log in with your MyChart username and password.   

## 2023-04-04 ENCOUNTER — Inpatient Hospital Stay: Payer: Medicare Other

## 2023-04-04 VITALS — BP 143/69 | HR 79 | Temp 98.5°F | Resp 18 | Wt 187.5 lb

## 2023-04-04 DIAGNOSIS — Z5111 Encounter for antineoplastic chemotherapy: Secondary | ICD-10-CM | POA: Diagnosis not present

## 2023-04-04 DIAGNOSIS — D46Z Other myelodysplastic syndromes: Secondary | ICD-10-CM

## 2023-04-04 MED ORDER — SODIUM CHLORIDE 0.9% FLUSH
10.0000 mL | INTRAVENOUS | Status: DC | PRN
  Administered 2023-04-04: 10 mL

## 2023-04-04 MED ORDER — SODIUM CHLORIDE 0.9 % IV SOLN: Freq: Once | INTRAVENOUS | Status: AC

## 2023-04-04 MED ORDER — SODIUM CHLORIDE 0.9 % IV SOLN
20.0000 mg/m2 | Freq: Once | INTRAVENOUS | Status: AC
  Administered 2023-04-04: 40 mg via INTRAVENOUS
  Filled 2023-04-04: qty 8

## 2023-04-04 MED ORDER — HEPARIN SOD (PORK) LOCK FLUSH 100 UNIT/ML IV SOLN
500.0000 [IU] | Freq: Once | INTRAVENOUS | Status: AC | PRN
  Administered 2023-04-04: 500 [IU]

## 2023-04-04 MED ORDER — PROCHLORPERAZINE MALEATE 10 MG PO TABS
10.0000 mg | ORAL_TABLET | Freq: Once | ORAL | Status: AC
  Administered 2023-04-04: 10 mg via ORAL
  Filled 2023-04-04: qty 1

## 2023-04-04 NOTE — Patient Instructions (Signed)
[facility]  Discharge Instructions: Thank you for choosing [facility] to provide your oncology and hematology care.   If you have a lab appointment with the Cancer Center, please go directly to the Cancer Center and check in at the registration area.   Wear comfortable clothing and clothing appropriate for easy access to any Portacath or PICC line.   We strive to give you quality time with your provider. You may need to reschedule your appointment if you arrive late (15 or more minutes).  Arriving late affects you and other patients whose appointments are after yours.  Also, if you miss three or more appointments without notifying the office, you may be dismissed from the clinic at the provider's discretion.      For prescription refill requests, have your pharmacy contact our office and allow 72 hours for refills to be completed.    Today you received the following chemotherapy and/or immunotherapy agents: Dacogen      To help prevent nausea and vomiting after your treatment, we encourage you to take your nausea medication as directed.  BELOW ARE SYMPTOMS THAT SHOULD BE REPORTED IMMEDIATELY: *FEVER GREATER THAN 100.4 F (38 C) OR HIGHER *CHILLS OR SWEATING *NAUSEA AND VOMITING THAT IS NOT CONTROLLED WITH YOUR NAUSEA MEDICATION *UNUSUAL SHORTNESS OF BREATH *UNUSUAL BRUISING OR BLEEDING *URINARY PROBLEMS (pain or burning when urinating, or frequent urination) *BOWEL PROBLEMS (unusual diarrhea, constipation, pain near the anus) TENDERNESS IN MOUTH AND THROAT WITH OR WITHOUT PRESENCE OF ULCERS (sore throat, sores in mouth, or a toothache) UNUSUAL RASH, SWELLING OR PAIN  UNUSUAL VAGINAL DISCHARGE OR ITCHING   Items with * indicate a potential emergency and should be followed up as soon as possible or go to the Emergency Department if any problems should occur.  Please show the CHEMOTHERAPY ALERT CARD or IMMUNOTHERAPY ALERT CARD at  check-in to the Emergency Department and triage nurse.  Should you have questions after your visit or need to cancel or reschedule your appointment, please contact [facility]  Dept: [phone]  and follow the prompts.  Office hours are 8:00 a.m. to 4:30 p.m. Monday - Friday. Please note that voicemails left after 4:00 p.m. may not be returned until the following business day.  We are closed weekends and major holidays. You have access to a nurse at all times for urgent questions. Please call the main number to the clinic Dept: [phone] and follow the prompts.   For any non-urgent questions, you may also contact your provider using MyChart. We now offer e-Visits for anyone 90 and older to request care online for non-urgent symptoms. For details visit [email or web address].   Also download the MyChart app! Go to the app store, search "MyChart", open the app, select [place], and log in with your MyChart username and password.

## 2023-04-05 ENCOUNTER — Inpatient Hospital Stay: Payer: Medicare Other

## 2023-04-05 ENCOUNTER — Other Ambulatory Visit: Payer: Self-pay | Admitting: Physician Assistant

## 2023-04-05 VITALS — BP 119/76 | HR 86 | Temp 98.6°F | Resp 18 | Wt 186.4 lb

## 2023-04-05 DIAGNOSIS — Z5111 Encounter for antineoplastic chemotherapy: Secondary | ICD-10-CM | POA: Diagnosis not present

## 2023-04-05 DIAGNOSIS — R21 Rash and other nonspecific skin eruption: Secondary | ICD-10-CM

## 2023-04-05 DIAGNOSIS — D46Z Other myelodysplastic syndromes: Secondary | ICD-10-CM

## 2023-04-05 MED ORDER — SODIUM CHLORIDE 0.9 % IV SOLN: Freq: Once | INTRAVENOUS | Status: AC

## 2023-04-05 MED ORDER — HEPARIN SOD (PORK) LOCK FLUSH 100 UNIT/ML IV SOLN
500.0000 [IU] | Freq: Once | INTRAVENOUS | Status: AC | PRN
  Administered 2023-04-05: 500 [IU]

## 2023-04-05 MED ORDER — PROCHLORPERAZINE MALEATE 10 MG PO TABS
10.0000 mg | ORAL_TABLET | Freq: Once | ORAL | Status: AC
  Administered 2023-04-05: 10 mg via ORAL
  Filled 2023-04-05: qty 1

## 2023-04-05 MED ORDER — SODIUM CHLORIDE 0.9 % IV SOLN
20.0000 mg/m2 | Freq: Once | INTRAVENOUS | Status: AC
  Administered 2023-04-05: 40 mg via INTRAVENOUS
  Filled 2023-04-05: qty 8

## 2023-04-05 MED ORDER — SODIUM CHLORIDE 0.9% FLUSH
10.0000 mL | INTRAVENOUS | Status: DC | PRN
  Administered 2023-04-05: 10 mL

## 2023-04-05 NOTE — Progress Notes (Signed)
Patient requesting dermatology referral for rash evaluation to a [facility] facility. Concern for drug induced rash.

## 2023-04-08 NOTE — Progress Notes (Unsigned)
Cardiology Office Note:   Date:  04/09/2023  ID:  [name] [name], DOB 12-14-1945, MRN [id number] PCP: Gaspar Garbe, MD  [facility] Providers Cardiologist:  None {  History of Present Illness:   [name] [name] is a 77 y.o. female  who presents for evaluation of SOB.  She was referred by Gaspar Garbe, MD.  An echocardiogram in 2013 at the [facility] clinic that demonstrated mild tricuspid regurgitation but was otherwise unremarkable.  She said this was done years ago because she had an abnormal EKG.  She had a distant stress test.   At the last visit she had a loud murmur but no abnormalities on echo.  She also had some chest pain that was atypical.  Since then she has had no further chest discomfort.  She is not having any new shortness of breath, PND or orthopnea.  She has had no palpitations, presyncope or syncope.  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     ROS: As stated in the HPI and negative for all other systems.  Studies Reviewed:    EKG:   EKG Interpretation Date/Time:  Tuesday April 09 2023 15:12:32 EDT Ventricular Rate:  75 PR Interval:  226 QRS Duration:  68 QT Interval:  368 QTC Calculation: 410 R Axis:   -11  Text Interpretation: Sinus rhythm with 1st degree A-V block Minimal voltage criteria for LVH, may be normal variant ( R in aVL ) Inferior infarct , age undetermined Cannot rule out Anterior infarct , age undetermined When compared with ECG of 10-Oct-2006 16:09, No significant change since last tracing Confirmed by Rollene Rotunda ([id number]) on 04/09/2023 3:35:31 PM     Risk Assessment/Calculations:              Physical Exam:   VS:  BP 128/66   Pulse 75   Ht 5\' 8"  (1.727 m)   Wt 185 lb 12.8 oz (84.3 kg)   SpO2 99%   BMI 28.25 kg/m    Wt Readings from Last 3 Encounters:  04/09/23 185 lb 12.8 oz (84.3 kg)  04/05/23 186 lb 6.4 oz (84.6 kg)  04/04/23 187 lb 8  oz (85 kg)     GEN: Well nourished, well developed in no acute distress NECK: No JVD; No carotid bruits CARDIAC: RRR, 2 out of 6 apical systolic murmur heard best at the apex and radiating to the axilla and slightly at the aortic outflow tract, no diastolic murmurs, rubs, gallops RESPIRATORY:  Clear to auscultation without rales, wheezing or rhonchi  ABDOMEN: Soft, non-tender, non-distended EXTREMITIES:  No edema; No deformity   ASSESSMENT AND PLAN:   SOB: This is baseline.  She is not having this or any further chest pain.  No further workup.  MURMUR: She does have a prominent murmur but it is unchanged from previous.  She had no significant pathology on her echo.  At this point I will follow this clinically with a repeat exam in a couple of years.  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  No further imaging.  She would be high risk for any surgical intervention and she and I talked about this.      Follow up with me in two years.   Signed, Rollene Rotunda, MD

## 2023-04-09 ENCOUNTER — Ambulatory Visit: Payer: Medicare Other | Attending: Cardiology | Admitting: Cardiology

## 2023-04-09 ENCOUNTER — Encounter: Payer: Self-pay | Admitting: Cardiology

## 2023-04-09 VITALS — BP 128/66 | HR 75 | Ht 68.0 in | Wt 185.8 lb

## 2023-04-09 DIAGNOSIS — R079 Chest pain, unspecified: Secondary | ICD-10-CM | POA: Insufficient documentation

## 2023-04-09 DIAGNOSIS — R0602 Shortness of breath: Secondary | ICD-10-CM | POA: Insufficient documentation

## 2023-04-09 NOTE — Patient Instructions (Signed)
    Follow-Up: At [facility], you and your health needs are our priority.  As part of our continuing mission to provide you with exceptional heart care, we have created designated Provider Care Teams.  These Care Teams include your primary Cardiologist (physician) and Advanced Practice Providers (APPs -  Physician Assistants and Nurse Practitioners) who all work together to provide you with the care you need, when you need it.  We recommend signing up for the patient portal called "MyChart".  Sign up information is provided on this After Visit Summary.  MyChart is used to connect with patients for Virtual Visits (Telemedicine).  Patients are able to view lab/test results, encounter notes, upcoming appointments, etc.  Non-urgent messages can be sent to your provider as well.   To learn more about what you can do with MyChart, go to [email or web address].    Your next appointment:   2 year(s)  Provider:   Rollene Rotunda MD

## 2023-04-10 ENCOUNTER — Inpatient Hospital Stay: Payer: Medicare Other

## 2023-04-10 ENCOUNTER — Other Ambulatory Visit: Payer: Self-pay | Admitting: *Deleted

## 2023-04-10 ENCOUNTER — Telehealth: Payer: Self-pay

## 2023-04-10 ENCOUNTER — Inpatient Hospital Stay (HOSPITAL_BASED_OUTPATIENT_CLINIC_OR_DEPARTMENT_OTHER): Payer: Medicare Other | Admitting: Hematology and Oncology

## 2023-04-10 VITALS — BP 98/62 | HR 82 | Temp 97.3°F | Resp 18 | Wt 185.5 lb

## 2023-04-10 VITALS — BP 121/52 | HR 63 | Resp 16

## 2023-04-10 DIAGNOSIS — D46Z Other myelodysplastic syndromes: Secondary | ICD-10-CM | POA: Diagnosis not present

## 2023-04-10 DIAGNOSIS — D696 Thrombocytopenia, unspecified: Secondary | ICD-10-CM

## 2023-04-10 DIAGNOSIS — Z5111 Encounter for antineoplastic chemotherapy: Secondary | ICD-10-CM | POA: Diagnosis not present

## 2023-04-10 LAB — CBC WITH DIFFERENTIAL/PLATELET
Abs Immature Granulocytes: 0 10*3/uL (ref 0.00–0.07)
Basophils Absolute: 0 10*3/uL (ref 0.0–0.1)
Basophils Relative: 1 %
Eosinophils Absolute: 0 10*3/uL (ref 0.0–0.5)
Eosinophils Relative: 0 %
HCT: 25.8 % — ABNORMAL LOW (ref 36.0–46.0)
Hemoglobin: 8.4 g/dL — ABNORMAL LOW (ref 12.0–15.0)
Immature Granulocytes: 0 %
Lymphocytes Relative: 87 %
Lymphs Abs: 1.3 10*3/uL (ref 0.7–4.0)
MCH: 30.8 pg (ref 26.0–34.0)
MCHC: 32.6 g/dL (ref 30.0–36.0)
MCV: 94.5 fL (ref 80.0–100.0)
Monocytes Absolute: 0 10*3/uL — ABNORMAL LOW (ref 0.1–1.0)
Monocytes Relative: 2 %
Neutro Abs: 0.2 10*3/uL — CL (ref 1.7–7.7)
Neutrophils Relative %: 10 %
Platelets: 19 10*3/uL — ABNORMAL LOW (ref 150–400)
RBC: 2.73 MIL/uL — ABNORMAL LOW (ref 3.87–5.11)
RDW: 18.3 % — ABNORMAL HIGH (ref 11.5–15.5)
Smear Review: DECREASED
WBC: 1.5 10*3/uL — ABNORMAL LOW (ref 4.0–10.5)
nRBC: 0 % (ref 0.0–0.2)

## 2023-04-10 MED ORDER — HEPARIN SOD (PORK) LOCK FLUSH 100 UNIT/ML IV SOLN
500.0000 [IU] | Freq: Once | INTRAVENOUS | Status: AC
  Administered 2023-04-10: 500 [IU]

## 2023-04-10 MED ORDER — HEPARIN SOD (PORK) LOCK FLUSH 100 UNIT/ML IV SOLN
500.0000 [IU] | Freq: Once | INTRAVENOUS | Status: AC
Start: 2023-04-10 — End: 2023-04-10
  Administered 2023-04-10: 500 [IU]

## 2023-04-10 MED ORDER — HYDROXYZINE PAMOATE 25 MG PO CAPS: 25.0000 mg | ORAL_CAPSULE | Freq: Two times a day (BID) | ORAL | 0 refills | Status: AC | PRN

## 2023-04-10 MED ORDER — SODIUM CHLORIDE 0.9% FLUSH
10.0000 mL | Freq: Once | INTRAVENOUS | Status: AC
Start: 2023-04-10 — End: 2023-04-10
  Administered 2023-04-10: 10 mL

## 2023-04-10 MED ORDER — LACTATED RINGERS IV SOLN
INTRAVENOUS | Status: AC
Start: 2023-04-10 — End: 2023-04-10

## 2023-04-10 NOTE — Patient Instructions (Signed)
Rehydration, Adult  Rehydration is the replacement of fluids, salts, and minerals in the body (electrolytes) that are lost during dehydration. Dehydration is when there is not enough water or other fluids in the body. This happens when you lose more fluids than you take in. People who are age 75 or older have a higher risk of dehydration than younger adults. This is because in older age, the body: Is less able to maintain the right amount of water. Does not respond to temperature changes as well. Does not get a sense of thirst as easily or quickly. Other causes include: Not drinking enough fluids. This can occur when you are ill, when you forget to drink, or when you are doing activities that require a lot of energy, especially in hot weather. Conditions that cause loss of water or other fluids. These include diarrhea, vomiting, sweating, or urinating a lot. Other illnesses, such as fever or infection. Certain medicines, such as those that remove excess fluid from the body (diuretics). Symptoms of mild or moderate dehydration may include thirst, dry lips and mouth, and dizziness. Symptoms of severe dehydration may include increased heart rate, confusion, fainting, and not urinating. In severe cases, you may need to get fluids through an IV at the hospital. For mild or moderate cases, you can usually rehydrate at home by drinking certain fluids as told by your health care provider. What are the risks? Rehydration is usually safe. Taking in too much fluid (overhydration) can be a problem but is rare. Overhydration can cause an imbalance of electrolytes in the body, kidney failure, fluid in the lungs, or a decrease in salt (sodium) levels in the body. Supplies needed: You will need an oral rehydration solution (ORS) if your health care provider tells you to use one. This is a drink to treat dehydration. It can be found in pharmacies and retail stores. How to rehydrate Fluids Follow instructions from  your health care provider about what to drink. The kind of fluid and the amount you should drink depend on your condition. In general, you should choose drinks that you prefer. If told by your health care provider, drink an ORS. Make an ORS by following instructions on the package. Start by drinking small amounts, about  cup (120 mL) every 5-10 minutes. Slowly increase how much you drink until you have taken in the amount recommended by your health care provider. Drink enough clear fluids to keep your urine pale yellow. If you were told to drink an ORS, finish it first, then start slowly drinking other clear fluids. Drink fluids such as: Water. This includes sparkling and flavored water. Drinking only water can lead to having too little sodium in your body (hyponatremia). Follow the advice of your health care provider. Water from ice chips you suck on. Fruit juice with water added to it(diluted). Sports drinks. Hot or cold herbal teas. Broth-based soups. Coffee. Milk or milk products. Food Follow instructions from your health care provider about what to eat while you rehydrate. Your health care provider may recommend that you slowly begin eating regular foods in small amounts. Eat foods that contain a healthy balance of electrolytes, such as bananas, oranges, potatoes, tomatoes, and spinach. Avoid foods that are greasy or contain a lot of sugar. In some cases, you may get nutrition through a feeding tube that is passed through your nose and into your stomach (nasogastric tube, or NG tube). This may be done if you have uncontrolled vomiting or diarrhea. Drinks to avoid  Certain drinks may make dehydration worse. While you rehydrate, avoid drinking alcohol. How to tell if you are recovering from dehydration You may be getting better if: You are urinating more often than before you started rehydrating. Your urine is pale yellow. Your energy level improves. You vomit less often. You have  diarrhea less often. Your appetite improves or returns to normal. You feel less dizzy or light-headed. Your skin tone and color start to look more normal. Follow these instructions at home: Take over-the-counter and prescription medicines only as told by your health care provider. Do not take sodium tablets. Doing this can lead to having too much sodium in your body (hypernatremia). Contact a health care provider if: You continue to have symptoms of mild or moderate dehydration, such as: Thirst. Dry lips. Slightly dry mouth. Dizziness. Dark urine or less urine than usual. Muscle cramps. You continue to vomit or have diarrhea. Get help right away if: You have symptoms of dehydration that get worse. You have a fever. You have a severe headache. You have been vomiting and have problems, such as: Your vomiting gets worse. Your vomit includes blood or green matter (bile). You cannot eat or drink without vomiting. You have problems with urination or bowel movements, such as: Diarrhea that gets worse. Blood in your stool (feces). This may cause stool to look black and tarry. Not urinating, or urinating only a small amount of very dark urine, within 6-8 hours. You have trouble breathing. You have symptoms that get worse with treatment. These symptoms may be an emergency. Get help right away. Call 911. Do not wait to see if the symptoms will go away. Do not drive yourself to the hospital. This information is not intended to replace advice given to you by your health care provider. Make sure you discuss any questions you have with your health care provider. Document Revised: 10/18/2021 Document Reviewed: 10/16/2021 Elsevier Patient Education  2024 [employer].

## 2023-04-10 NOTE — Assessment & Plan Note (Signed)
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  4 points - IPSS-R Score Intermediate risk Median survival - 3 yrs Median time to 25% AML evolution: 3.2 years  01/07/2023: Bone marrow aspirate: Hypercellular bone marrow 50% cellularity with dysplasia, flow cytometry: 5% CD34 positive blasts, cytogenetics: Normal MDS [employer] panel showed ASXL , CBL, IDH2 and SRSF2.   She was treated with azacitidine but unfortunately 3 days into treatment developed a rash likely secondary to azacitidine.  She is now on decitabine.  Myelodysplasia Syndrome Discussed the risks and benefits of bone marrow transplant. Patient is hesitant due to age and potential quality of life impact. -Continue current treatment regimen. -Consider bone marrow transplant if patient changes mind. -Hemoglobin over 8 g, no indication for packed red blood cell transfusion.  Platelet count at 19,000, no evidence of active bleeding hence we will continue to monitor without any transfusion today.  Rash Itchy rash present on trunk, groin, and armpits. Different presentation than previous rash. Concern for potential medication-induced rash. -Rash has been very well-controlled on prednisone 10 mg. -Schedule follow-up with dermatologist, Theador Hawthorne.  Soft Tissue Mass Firm mass present on back of leg for approximately 1.5 mos.  We will follow up on this.  Dizziness Positional, likely orthostatic, blood pressure at the lower limit of normal she is not on any antihypertensive.  Will arrange for 1 L Ringer's lactate today over 90 minutes since there is nationwide shortage of normal saline.  I have also encouraged her to drink more fluids.  General Health Maintenance Return on Friday morning 8 AM for a repeat CBC, sample to blood bank to see if she indeed needs any transfusions. She has [facility] Aria infusion on Friday morning  at around 1030 hence she will return to clinic around 12 PM for any transfusions if needed.

## 2023-04-10 NOTE — Progress Notes (Signed)
[facility] Cancer Follow up:    [name], [name], MD [address] [place] [address]   DIAGNOSIS: Myelodysplastic Syndrome  SUMMARY OF ONCOLOGIC HISTORY: Oncology History  MDS (myelodysplastic syndrome), high grade (HCC)  01/07/2023 Initial Biopsy   Bone marrow biopsy:cellular bone marrow with focal areas of hypercellularity (approximately up to  50%).  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  Blasts appear mildly mildly increased, approximately 5% to focally up to 10%.  Flow cytometric analysis reveals 5% CD34 positive blasts.  While the findings could be attributable to the patient's coexisting conditions, the possibility of a myeloid neoplasm such as myelodysplastic neoplasm with increased blasts cannot be entirely excluded. normal karyotype   01/29/2023 Initial Diagnosis   MDS (myelodysplastic syndrome), high grade (HCC)   02/04/2023 - 02/06/2023 Chemotherapy   Patient is on Treatment Plan : MYELODYSPLASIA  Azacitidine IV D1-5 q28d     03/04/2023 -  Chemotherapy   Patient is on Treatment Plan : MYELODYSPLASIA Decitabine D1-5 q28d       CURRENT THERAPY: Azacitidine   [name] [name] 77 y.o. female returns for follow-up after receiving azacitidine.  Unfortunately 2 to 3 days into the azacitidine she developed a rash and azacitidine was held.  She was switched to decitabine.  Discussed the use of AI scribe software for clinical note transcription with the patient, who gave verbal consent to proceed.  History of Present Illness    [name] [name] is here for follow-up.  She complains of ongoing dizziness for the past weekend.  She has not noticed in any particular position however she mentioned of dizziness when she stood from sitting position to get on the examination table.  Other than the dizziness she denies any worsening skin rash.  She has been taking prednisone 10 mg daily.  She requests a refill for Vistaril.  She has met with her cardiologist  yesterday who recommended a follow-up in 2 years.  She denies any bleeding issues.  Rest of the pertinent 10 point ROS reviewed and negative  Patient Active Problem List   Diagnosis Date Noted   Hives 03/18/2023   MDS (myelodysplastic syndrome), high grade (HCC) 01/29/2023   Obesity 03/02/2021   Other long term (current) drug therapy 03/02/2021   Primary osteoarthritis 03/02/2021   SOB (shortness of breath) 12/08/2020   Pericardial effusion 12/08/2020   Dry eyes/dry mouth 05/25/2019   Bronchitis, mucopurulent recurrent (HCC) 02/26/2019   Moderate persistent asthma without complication 02/24/2019   Perennial and seasonal allergic rhinitis 02/24/2019   Seasonal allergic conjunctivitis 02/24/2019   Primary osteoarthritis of both knees 04/10/2016   Acute chest pain 08/19/2014   Costochondritis 08/19/2014   Lung nodule 08/19/2014   HTN (hypertension) 08/07/2012   HLD (hyperlipidemia) 09/26/2011   Asthma 09/06/2010   Depression 09/06/2010   History of allergy 09/06/2010   Hypothyroidism 09/06/2010   Lower back pain 09/06/2010   Arthritis 09/06/2010   Pain in joint, pelvic region and thigh 09/06/2010   Peripheral neuropathy 09/06/2010   Psoriasis 09/06/2010   Therapeutic drug monitoring 09/06/2010   Tinnitus 09/06/2010   Vitamin B 12 deficiency 09/06/2010   Cervical spondylosis without myelopathy 04/24/2006   Tear of lateral cartilage or meniscus of knee, current 03/13/2006    is allergic to latex, cat hair extract, dust mite extract, molds & smuts, other, and pollen extract-tree extract [pollen extract].  MEDICAL HISTORY: Past Medical History:  Diagnosis Date   Asthma    Diverticulitis    Hypothyroid  Psoriatic arthritis [facility])     SURGICAL HISTORY: Past Surgical History:  Procedure Laterality Date   COLONOSCOPY  08/2021   HIP SURGERY Bilateral    IR IMAGING GUIDED PORT INSERTION  02/20/2023   TONSILLECTOMY     TUBAL LIGATION      SOCIAL HISTORY: Social History    Socioeconomic History   Marital status: Widowed    Spouse name: Not on file   Number of children: Not on file   Years of education: Not on file   Highest education level: Not on file  Occupational History   Not on file  Tobacco Use   Smoking status: Former    Current packs/day: 0.00    Average packs/day: 0.1 packs/day for 33.4 years (3.3 ttl pk-yrs)    Types: Cigarettes    Start date: 66    Quit date: 11/18/2000    Years since quitting: 22.4   Smokeless tobacco: Never   Tobacco comments:    3 cigarettes a day  Vaping Use   Vaping status: Never Used  Substance and Sexual Activity   Alcohol use: Never   Drug use: Never   Sexual activity: Not Currently  Other Topics Concern   Not on file  Social History Narrative   Lives with children   Social Determinants of Health   Financial Resource Strain: Not on file  Food Insecurity: No Food Insecurity (11/24/2022)   Hunger Vital Sign    Worried About Running Out of Food in the Last Year: Never true    Ran Out of Food in the Last Year: Never true  Transportation Needs: No Transportation Needs (11/24/2022)   PRAPARE - Administrator, Civil Service (Medical): No    Lack of Transportation (Non-Medical): No  Physical Activity: Not on file  Stress: Not on file  Social Connections: Not on file  Intimate Partner Violence: Not At Risk (11/24/2022)   Humiliation, Afraid, Rape, and Kick questionnaire    Fear of Current or Ex-Partner: No    Emotionally Abused: No    Physically Abused: No    Sexually Abused: No    FAMILY HISTORY: Family History  Problem Relation Age of Onset   Asthma Mother    Allergic rhinitis Mother    Rheum arthritis Mother    Allergic rhinitis Father    Alzheimer's disease Father    Eczema Brother    Allergic rhinitis Brother    Colon cancer Neg Hx    Esophageal cancer Neg Hx    Stomach cancer Neg Hx    Rectal cancer Neg Hx     Review of Systems  Constitutional:  Negative for appetite change,  chills, fatigue, fever and unexpected weight change.  HENT:   Negative for hearing loss, lump/mass and trouble swallowing.   Eyes:  Negative for eye problems and icterus.  Respiratory:  Negative for chest tightness, cough and shortness of breath.   Cardiovascular:  Negative for chest pain, leg swelling and palpitations.  Gastrointestinal:  Negative for abdominal distention, abdominal pain, constipation, diarrhea, nausea and vomiting.  Endocrine: Negative for hot flashes.  Genitourinary:  Negative for difficulty urinating.   Musculoskeletal:  Negative for arthralgias.  Skin:  Negative for itching and rash.  Neurological:  Negative for dizziness, extremity weakness, headaches and numbness.  Hematological:  Negative for adenopathy. Does not bruise/bleed easily.  Psychiatric/Behavioral:  Negative for depression. The patient is not nervous/anxious.       PHYSICAL EXAMINATION     Vitals:   04/10/23 1236  BP: 98/62  Pulse: 82  Resp: 18  Temp: (!) 97.3 F (36.3 C)  SpO2: 98%    HEENT: Oropharynx without lesions. CHEST: Breath sounds clear. SKIN:  Rash has improved.  LABORATORY DATA:  CBC    Component Value Date/Time   WBC 1.5 (L) 04/10/2023 1150   RBC 2.73 (L) 04/10/2023 1150   HGB 8.4 (L) 04/10/2023 1150   HGB 8.8 (L) 04/01/2023 1304   HCT 25.8 (L) 04/10/2023 1150   HCT 39.0 11/24/2022 1035   PLT 19 (L) 04/10/2023 1150   PLT 65 (L) 04/01/2023 1304   MCV 94.5 04/10/2023 1150   MCH 30.8 04/10/2023 1150   MCHC 32.6 04/10/2023 1150   RDW 18.3 (H) 04/10/2023 1150   LYMPHSABS 1.3 04/10/2023 1150   MONOABS 0.0 (L) 04/10/2023 1150   EOSABS 0.0 04/10/2023 1150   BASOSABS 0.0 04/10/2023 1150    CMP     Component Value Date/Time   NA 138 04/01/2023 1304   K 3.9 04/01/2023 1304   CL 108 04/01/2023 1304   CO2 25 04/01/2023 1304   GLUCOSE 101 (H) 04/01/2023 1304   BUN 10 04/01/2023 1304   CREATININE 0.58 04/01/2023 1304   CALCIUM 9.0 04/01/2023 1304   PROT 6.8  04/01/2023 1304   ALBUMIN 3.6 04/01/2023 1304   AST 15 04/01/2023 1304   ALT 7 04/01/2023 1304   ALKPHOS 52 04/01/2023 1304   BILITOT 0.5 04/01/2023 1304   GFRNONAA >60 04/01/2023 1304        ASSESSMENT and THERAPY PLAN:   MDS (myelodysplastic syndrome), high grade (HCC) This is a very pleasant 77 year old female patient with MDS, ASXL1 mutation, CBL, 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  4 points - IPSS-R Score Intermediate risk Median survival - 3 yrs Median time to 25% AML evolution: 3.2 years  01/07/2023: Bone marrow aspirate: Hypercellular bone marrow 50% cellularity with dysplasia, flow cytometry: 5% CD34 positive blasts, cytogenetics: Normal MDS [employer] panel showed ASXL , CBL, IDH2 and SRSF2.   She was treated with azacitidine but unfortunately 3 days into treatment developed a rash likely secondary to azacitidine.  She is now on decitabine.  Myelodysplasia Syndrome Discussed the risks and benefits of bone marrow transplant. Patient is hesitant due to age and potential quality of life impact. -Continue current treatment regimen. -Consider bone marrow transplant if patient changes mind. -Hemoglobin over 8 g, no indication for packed red blood cell transfusion.  Platelet count at 19,000, no evidence of active bleeding hence we will continue to monitor without any transfusion today.  Rash Itchy rash present on trunk, groin, and armpits. Different presentation than previous rash. Concern for potential medication-induced rash. -Rash has been very well-controlled on prednisone 10 mg. -Schedule follow-up with dermatologist, Theador Hawthorne.  Soft Tissue Mass Firm mass present on back of leg for approximately 1.5 mos.  We will follow up on this.  Dizziness Positional, likely orthostatic, blood pressure at the lower limit of normal she is not on any antihypertensive.  Will arrange for 1 L Ringer's lactate today over 90 minutes since  there is nationwide shortage of normal saline.  I have also encouraged her to drink more fluids.  General Health Maintenance Return on Friday morning 8 AM for a repeat CBC, sample to blood bank to see if she indeed needs any transfusions. She has [facility] infusion on Friday morning at around 1030 hence she will return to clinic around 12 PM for any transfusions if needed.  All questions were answered. The patient knows to call the clinic with any problems, questions or concerns. We can certainly see the patient much sooner if necessary.  Total encounter time:30 minutes*in face-to-face visit time, chart review, lab review, care coordination, order entry, and documentation of the encounter time.    *Total Encounter Time as defined by the Centers for Medicare and Medicaid Services includes, in addition to the face-to-face time of a patient visit (documented in the note above) non-face-to-face time: obtaining and reviewing outside history, ordering and reviewing medications, tests or procedures, care coordination (communications with other health care professionals or caregivers) and documentation in the medical record.

## 2023-04-10 NOTE — Telephone Encounter (Signed)
Open in Error.

## 2023-04-10 NOTE — Telephone Encounter (Signed)
CRITICAL VALUE STICKER  CRITICAL VALUE: ANC 0.2  RECEIVER (on-site recipient of call): Sharlette Dense CMA  DATE & TIME NOTIFIED: 04/10/2023 1258  MESSENGER (representative from lab): Mindi Junker in lab  MD NOTIFIED: Iruku  TIME OF NOTIFICATION: 1258  RESPONSE: Made doctor aware

## 2023-04-12 ENCOUNTER — Other Ambulatory Visit: Payer: Self-pay | Admitting: *Deleted

## 2023-04-12 ENCOUNTER — Inpatient Hospital Stay: Payer: Medicare Other

## 2023-04-12 DIAGNOSIS — Z5111 Encounter for antineoplastic chemotherapy: Secondary | ICD-10-CM | POA: Diagnosis not present

## 2023-04-12 DIAGNOSIS — D696 Thrombocytopenia, unspecified: Secondary | ICD-10-CM

## 2023-04-12 DIAGNOSIS — D46Z Other myelodysplastic syndromes: Secondary | ICD-10-CM

## 2023-04-12 LAB — SAMPLE TO BLOOD BANK

## 2023-04-12 LAB — CBC WITH DIFFERENTIAL/PLATELET
Abs Immature Granulocytes: 0 10*3/uL (ref 0.00–0.07)
Basophils Absolute: 0 10*3/uL (ref 0.0–0.1)
Basophils Relative: 0 %
Eosinophils Absolute: 0 10*3/uL (ref 0.0–0.5)
Eosinophils Relative: 0 %
HCT: 26.2 % — ABNORMAL LOW (ref 36.0–46.0)
Hemoglobin: 8.2 g/dL — ABNORMAL LOW (ref 12.0–15.0)
Immature Granulocytes: 0 %
Lymphocytes Relative: 79 %
Lymphs Abs: 1.3 10*3/uL (ref 0.7–4.0)
MCH: 29.9 pg (ref 26.0–34.0)
MCHC: 31.3 g/dL (ref 30.0–36.0)
MCV: 95.6 fL (ref 80.0–100.0)
Monocytes Absolute: 0.1 10*3/uL (ref 0.1–1.0)
Monocytes Relative: 3 %
Neutro Abs: 0.3 10*3/uL — CL (ref 1.7–7.7)
Neutrophils Relative %: 18 %
Platelets: 9 10*3/uL — CL (ref 150–400)
RBC: 2.74 MIL/uL — ABNORMAL LOW (ref 3.87–5.11)
RDW: 17.9 % — ABNORMAL HIGH (ref 11.5–15.5)
Smear Review: NORMAL
WBC: 1.7 10*3/uL — ABNORMAL LOW (ref 4.0–10.5)
nRBC: 0 % (ref 0.0–0.2)

## 2023-04-12 LAB — CMP (CANCER CENTER ONLY)
ALT: 8 U/L (ref 0–44)
AST: 13 U/L — ABNORMAL LOW (ref 15–41)
Albumin: 3.7 g/dL (ref 3.5–5.0)
Alkaline Phosphatase: 49 U/L (ref 38–126)
Anion gap: 4 — ABNORMAL LOW (ref 5–15)
BUN: 10 mg/dL (ref 8–23)
CO2: 29 mmol/L (ref 22–32)
Calcium: 8.9 mg/dL (ref 8.9–10.3)
Chloride: 108 mmol/L (ref 98–111)
Creatinine: 0.41 mg/dL — ABNORMAL LOW (ref 0.44–1.00)
GFR, Estimated: >60 mL/min (ref >60)
Glucose, Bld: 94 mg/dL (ref 70–99)
Potassium: 4.1 mmol/L (ref 3.5–5.1)
Sodium: 141 mmol/L (ref 135–145)
Total Bilirubin: 0.5 mg/dL (ref 0.3–1.2)
Total Protein: 6.8 g/dL (ref 6.5–8.1)

## 2023-04-12 MED ORDER — ACETAMINOPHEN 325 MG PO TABS
650.0000 mg | ORAL_TABLET | Freq: Once | ORAL | Status: AC
  Administered 2023-04-12: 650 mg via ORAL
  Filled 2023-04-12: qty 2

## 2023-04-12 MED ORDER — DIPHENHYDRAMINE HCL 25 MG PO CAPS
25.0000 mg | ORAL_CAPSULE | Freq: Once | ORAL | Status: AC
  Administered 2023-04-12: 25 mg via ORAL
  Filled 2023-04-12: qty 1

## 2023-04-12 MED ORDER — SODIUM CHLORIDE 0.9% IV SOLUTION
250.0000 mL | INTRAVENOUS | Status: DC
  Administered 2023-04-12: 100 mL via INTRAVENOUS

## 2023-04-12 NOTE — Patient Instructions (Signed)
Platelet Transfusion A platelet transfusion is a procedure in which a person receives donated platelets through an IV. Platelets are parts of blood that stick together and form a clot to help the body stop bleeding after an injury. If you have too few platelets, your blood may have trouble clotting. This may cause you to bleed and bruise very easily. You may need a platelet transfusion if you have a condition that causes a low number of platelets (thrombocytopenia). A platelet transfusion may be used to stop or prevent excessive bleeding. Tell a health care provider about: Any reactions you have had during previous transfusions. Any allergies you have. All medicines you are taking, including vitamins, herbs, eye drops, creams, and over-the-counter medicines. Any bleeding problems you have. Any surgeries you have had. Any medical conditions you have. Whether you are pregnant or may be pregnant. What are the risks? Generally, this is a safe procedure. However, problems may occur, including: Fever. Infection. Allergic reaction to the donated (donor) platelets. Your body's disease-fighting system (immune system) attacking the donor platelets (hemolytic reaction). This is rare. A rare reaction that causes lung damage (transfusion-related acute lung injury). What happens before the procedure? Medicines Ask your health care provider about: Changing or stopping your regular medicines. This is especially important if you are taking diabetes medicines or blood thinners. Taking medicines such as aspirin and ibuprofen. These medicines can thin your blood. Do not take these medicines unless your health care provider tells you to take them. Taking over-the-counter medicines, vitamins, herbs, and supplements. General instructions You will have a blood test to determine your blood type. Your blood type determines what kind of platelets you will be given. Follow instructions from your health care provider  about eating or drinking restrictions. If you have had an allergic reaction to a transfusion in the past, you may be given medicine to help prevent a reaction. Your temperature, blood pressure, pulse, and breathing will be monitored. What happens during the procedure?  An IV will be inserted into one of your veins. For your safety, two health care providers will verify your identity along with the donor platelets about to be infused. A bag of donor platelets will be connected to your IV. The platelets will flow into your bloodstream. This usually takes 30-60 minutes. Your temperature, blood pressure, pulse, and breathing will be monitored during the transfusion. This helps detect early signs of any reaction. You will also be monitored for other symptoms that may indicate a reaction, including chills, hives, or itching. If you have signs of a reaction at any time, your transfusion will be stopped, and you may be given medicine to help manage the reaction. When your transfusion is complete, your IV will be removed. Pressure may be applied to the IV site for a few minutes to stop any bleeding. The IV site will be covered with a bandage (dressing). The procedure may vary among health care providers and hospitals. What can I expect after the procedure? Your blood pressure, temperature, pulse, and breathing will be monitored until you leave the hospital or clinic. You may have some bruising and soreness at your IV site. Follow these instructions at home: Medicines Take over-the-counter and prescription medicines only as told by your health care provider. Talk with your health care provider before you take any medicines that contain aspirin or NSAIDs, such as ibuprofen. These medicines increase your risk for dangerous bleeding. IV site care Check your IV site every day for signs of infection. Check for:   Redness, swelling, or pain. Fluid or blood. If fluid or blood drains from your IV site, use your  hands to press down firmly on a bandage covering the area for a minute or two. Doing this should stop the bleeding. Warmth. Pus or a bad smell. General instructions Change or remove your dressing as told by your health care provider. Return to your normal activities as told by your health care provider. Ask your health care provider what activities are safe for you. Do not take baths, swim, or use a hot tub until your health care provider approves. Ask your health care provider if you may take showers. Keep all follow-up visits. This is important. Contact a health care provider if: You have a headache that does not go away with medicine. You have hives, rash, or itchy skin. You have nausea or vomiting. You feel unusually tired or weak. You have signs of infection at your IV site. Get help right away if: You have a fever or chills. You urinate less often than usual. Your urine is darker colored than normal. You have any of the following: Trouble breathing. Pain in your back, abdomen, or chest. Cool, clammy skin. A fast heartbeat. Summary Platelets are tiny pieces of blood cells that clump together to form a blood clot when you have an injury. If you have too few platelets, your blood may have trouble clotting. A platelet transfusion is a procedure in which you receive donated platelets through an IV. A platelet transfusion may be used to stop or prevent excessive bleeding. After the procedure, check your IV site every day for signs of infection. This information is not intended to replace advice given to you by your health care provider. Make sure you discuss any questions you have with your health care provider. Document Revised: 12/08/2020 Document Reviewed: 12/08/2020 Elsevier Patient Education  2024 [employer]  

## 2023-04-14 ENCOUNTER — Encounter: Payer: Self-pay | Admitting: Hematology and Oncology

## 2023-04-15 ENCOUNTER — Encounter: Payer: Self-pay | Admitting: Hematology and Oncology

## 2023-04-15 LAB — PREPARE PLATELET PHERESIS: Unit division: 0

## 2023-04-15 LAB — BPAM PLATELET PHERESIS
Blood Product Expiration Date: 202410282359
ISSUE DATE / TIME: 202410251258
Unit Type and Rh: 5100

## 2023-04-17 ENCOUNTER — Inpatient Hospital Stay: Payer: Medicare Other

## 2023-04-17 ENCOUNTER — Other Ambulatory Visit: Payer: Self-pay | Admitting: Hematology and Oncology

## 2023-04-17 ENCOUNTER — Other Ambulatory Visit: Payer: Self-pay | Admitting: *Deleted

## 2023-04-17 VITALS — BP 118/59 | HR 68 | Temp 98.2°F | Resp 18 | Wt 187.0 lb

## 2023-04-17 DIAGNOSIS — D469 Myelodysplastic syndrome, unspecified: Secondary | ICD-10-CM

## 2023-04-17 DIAGNOSIS — Z5111 Encounter for antineoplastic chemotherapy: Secondary | ICD-10-CM | POA: Diagnosis not present

## 2023-04-17 DIAGNOSIS — D696 Thrombocytopenia, unspecified: Secondary | ICD-10-CM

## 2023-04-17 DIAGNOSIS — D46Z Other myelodysplastic syndromes: Secondary | ICD-10-CM

## 2023-04-17 LAB — SAMPLE TO BLOOD BANK

## 2023-04-17 LAB — PREPARE RBC (CROSSMATCH)

## 2023-04-17 MED ORDER — ACETAMINOPHEN 325 MG PO TABS
650.0000 mg | ORAL_TABLET | Freq: Once | ORAL | Status: AC
  Administered 2023-04-17: 650 mg via ORAL
  Filled 2023-04-17: qty 2

## 2023-04-17 MED ORDER — SODIUM CHLORIDE 0.9% IV SOLUTION
250.0000 mL | INTRAVENOUS | Status: AC
  Administered 2023-04-17: 250 mL via INTRAVENOUS

## 2023-04-17 MED ORDER — SODIUM CHLORIDE 0.9% FLUSH
10.0000 mL | Freq: Once | INTRAVENOUS | Status: AC
  Administered 2023-04-17: 10 mL

## 2023-04-17 MED ORDER — DIPHENHYDRAMINE HCL 25 MG PO CAPS
25.0000 mg | ORAL_CAPSULE | Freq: Once | ORAL | Status: AC
  Administered 2023-04-17: 25 mg via ORAL
  Filled 2023-04-17: qty 1

## 2023-04-17 NOTE — Progress Notes (Unsigned)
OK per Dr.Iruku to run PRBC at 300 ml/hr

## 2023-04-17 NOTE — Progress Notes (Signed)
Per My Chart message pt states she was informed by her primary MD that platelets are 7 and heme is 8.  Kyley denies any current bleeding but does illicit "feeling very lightheaded"  This RN obtained appts for 1 unit plts and PRBC for transfusion today.  Hard copy of labs per above received by fax with noted draw date of 10/28.

## 2023-04-18 ENCOUNTER — Ambulatory Visit: Payer: Medicare Other | Admitting: Family Medicine

## 2023-04-18 LAB — BPAM RBC
Blood Product Expiration Date: 202411112359
ISSUE DATE / TIME: 202410301431
Unit Type and Rh: 5100

## 2023-04-18 LAB — TYPE AND SCREEN
ABO/RH(D): O POS
Antibody Screen: NEGATIVE
Unit division: 0

## 2023-04-18 LAB — BPAM PLATELET PHERESIS
Blood Product Expiration Date: 202410312359
ISSUE DATE / TIME: 202410301324
Unit Type and Rh: 5100

## 2023-04-18 LAB — PREPARE PLATELET PHERESIS: Unit division: 0

## 2023-04-18 NOTE — Addendum Note (Signed)
Encounter addended by: Edward Qualia on: 04/18/2023 8:22 AM  Actions taken: Imaging Exam ended

## 2023-04-19 ENCOUNTER — Other Ambulatory Visit: Payer: Self-pay | Admitting: Hematology and Oncology

## 2023-04-19 ENCOUNTER — Encounter: Payer: Self-pay | Admitting: Hematology and Oncology

## 2023-04-19 ENCOUNTER — Inpatient Hospital Stay: Payer: Medicare Other | Attending: Hematology and Oncology

## 2023-04-19 VITALS — BP 119/71 | HR 83 | Temp 98.4°F | Resp 18 | Wt 189.3 lb

## 2023-04-19 DIAGNOSIS — Z9851 Tubal ligation status: Secondary | ICD-10-CM | POA: Diagnosis not present

## 2023-04-19 DIAGNOSIS — J4540 Moderate persistent asthma, uncomplicated: Secondary | ICD-10-CM | POA: Insufficient documentation

## 2023-04-19 DIAGNOSIS — R110 Nausea: Secondary | ICD-10-CM | POA: Insufficient documentation

## 2023-04-19 DIAGNOSIS — R5383 Other fatigue: Secondary | ICD-10-CM | POA: Insufficient documentation

## 2023-04-19 DIAGNOSIS — Z5111 Encounter for antineoplastic chemotherapy: Secondary | ICD-10-CM | POA: Insufficient documentation

## 2023-04-19 DIAGNOSIS — Z87440 Personal history of urinary (tract) infections: Secondary | ICD-10-CM | POA: Diagnosis not present

## 2023-04-19 DIAGNOSIS — Z825 Family history of asthma and other chronic lower respiratory diseases: Secondary | ICD-10-CM | POA: Insufficient documentation

## 2023-04-19 DIAGNOSIS — R350 Frequency of micturition: Secondary | ICD-10-CM | POA: Insufficient documentation

## 2023-04-19 DIAGNOSIS — D696 Thrombocytopenia, unspecified: Secondary | ICD-10-CM

## 2023-04-19 DIAGNOSIS — Z79899 Other long term (current) drug therapy: Secondary | ICD-10-CM | POA: Insufficient documentation

## 2023-04-19 DIAGNOSIS — Z9089 Acquired absence of other organs: Secondary | ICD-10-CM | POA: Insufficient documentation

## 2023-04-19 DIAGNOSIS — Z818 Family history of other mental and behavioral disorders: Secondary | ICD-10-CM | POA: Insufficient documentation

## 2023-04-19 DIAGNOSIS — D46Z Other myelodysplastic syndromes: Secondary | ICD-10-CM

## 2023-04-19 DIAGNOSIS — Z87891 Personal history of nicotine dependence: Secondary | ICD-10-CM | POA: Insufficient documentation

## 2023-04-19 DIAGNOSIS — Z8261 Family history of arthritis: Secondary | ICD-10-CM | POA: Diagnosis not present

## 2023-04-19 DIAGNOSIS — R5081 Fever presenting with conditions classified elsewhere: Secondary | ICD-10-CM | POA: Insufficient documentation

## 2023-04-19 DIAGNOSIS — D469 Myelodysplastic syndrome, unspecified: Secondary | ICD-10-CM | POA: Insufficient documentation

## 2023-04-19 DIAGNOSIS — Z91030 Bee allergy status: Secondary | ICD-10-CM | POA: Diagnosis not present

## 2023-04-19 DIAGNOSIS — D709 Neutropenia, unspecified: Secondary | ICD-10-CM | POA: Diagnosis not present

## 2023-04-19 LAB — CBC WITH DIFFERENTIAL (CANCER CENTER ONLY)
Abs Immature Granulocytes: 0 10*3/uL (ref 0.00–0.07)
Basophils Absolute: 0 10*3/uL (ref 0.0–0.1)
Basophils Relative: 0 %
Eosinophils Absolute: 0 10*3/uL (ref 0.0–0.5)
Eosinophils Relative: 0 %
HCT: 26.9 % — ABNORMAL LOW (ref 36.0–46.0)
Hemoglobin: 8.5 g/dL — ABNORMAL LOW (ref 12.0–15.0)
Immature Granulocytes: 0 %
Lymphocytes Relative: 89 %
Lymphs Abs: 1.3 10*3/uL (ref 0.7–4.0)
MCH: 29.5 pg (ref 26.0–34.0)
MCHC: 31.6 g/dL (ref 30.0–36.0)
MCV: 93.4 fL (ref 80.0–100.0)
Monocytes Absolute: 0 10*3/uL — ABNORMAL LOW (ref 0.1–1.0)
Monocytes Relative: 1 %
Neutro Abs: 0.1 10*3/uL — CL (ref 1.7–7.7)
Neutrophils Relative %: 10 %
Platelet Count: 18 10*3/uL — ABNORMAL LOW (ref 150–400)
RBC: 2.88 MIL/uL — ABNORMAL LOW (ref 3.87–5.11)
RDW: 21.1 % — ABNORMAL HIGH (ref 11.5–15.5)
Smear Review: DECREASED
WBC Count: 1.4 10*3/uL — ABNORMAL LOW (ref 4.0–10.5)
nRBC: 1.4 % — ABNORMAL HIGH (ref 0.0–0.2)

## 2023-04-19 LAB — SAMPLE TO BLOOD BANK

## 2023-04-19 MED ORDER — HEPARIN SOD (PORK) LOCK FLUSH 100 UNIT/ML IV SOLN
500.0000 [IU] | Freq: Once | INTRAVENOUS | Status: AC
  Administered 2023-04-19: 500 [IU]

## 2023-04-19 MED ORDER — SODIUM CHLORIDE 0.9% FLUSH
10.0000 mL | Freq: Once | INTRAVENOUS | Status: AC
  Administered 2023-04-19: 10 mL

## 2023-04-19 NOTE — Progress Notes (Signed)
Per Dr.Iruku, not prbc's needed today. Hgb 8.5. Pt was scheduled for labs and smc infusion for next week. Pt notified while in clinic of appts. Pt verbalized understanding.

## 2023-04-22 ENCOUNTER — Encounter: Payer: Self-pay | Admitting: Internal Medicine

## 2023-04-22 ENCOUNTER — Other Ambulatory Visit: Payer: Self-pay | Admitting: Hematology and Oncology

## 2023-04-22 ENCOUNTER — Telehealth: Payer: Self-pay

## 2023-04-22 ENCOUNTER — Inpatient Hospital Stay: Payer: Medicare Other

## 2023-04-22 VITALS — BP 107/65 | HR 87 | Temp 97.9°F | Resp 18 | Wt 188.4 lb

## 2023-04-22 DIAGNOSIS — D696 Thrombocytopenia, unspecified: Secondary | ICD-10-CM

## 2023-04-22 DIAGNOSIS — D46Z Other myelodysplastic syndromes: Secondary | ICD-10-CM

## 2023-04-22 DIAGNOSIS — Z5111 Encounter for antineoplastic chemotherapy: Secondary | ICD-10-CM | POA: Diagnosis not present

## 2023-04-22 LAB — CBC WITH DIFFERENTIAL/PLATELET
Abs Immature Granulocytes: 0.01 10*3/uL (ref 0.00–0.07)
Basophils Absolute: 0 10*3/uL (ref 0.0–0.1)
Basophils Relative: 0 %
Eosinophils Absolute: 0 10*3/uL (ref 0.0–0.5)
Eosinophils Relative: 0 %
HCT: 27 % — ABNORMAL LOW (ref 36.0–46.0)
Hemoglobin: 8.6 g/dL — ABNORMAL LOW (ref 12.0–15.0)
Immature Granulocytes: 1 %
Lymphocytes Relative: 94 %
Lymphs Abs: 1 10*3/uL (ref 0.7–4.0)
MCH: 30.3 pg (ref 26.0–34.0)
MCHC: 31.9 g/dL (ref 30.0–36.0)
MCV: 95.1 fL (ref 80.0–100.0)
Monocytes Absolute: 0 10*3/uL — ABNORMAL LOW (ref 0.1–1.0)
Monocytes Relative: 2 %
Neutro Abs: 0 10*3/uL — CL (ref 1.7–7.7)
Neutrophils Relative %: 3 %
Platelets: 58 10*3/uL — ABNORMAL LOW (ref 150–400)
RBC: 2.84 MIL/uL — ABNORMAL LOW (ref 3.87–5.11)
RDW: 21.2 % — ABNORMAL HIGH (ref 11.5–15.5)
Smear Review: NORMAL
WBC: 1 10*3/uL — ABNORMAL LOW (ref 4.0–10.5)
nRBC: 1.9 % — ABNORMAL HIGH (ref 0.0–0.2)

## 2023-04-22 LAB — TYPE AND SCREEN
ABO/RH(D): O POS
Antibody Screen: NEGATIVE

## 2023-04-22 MED ORDER — SODIUM CHLORIDE 0.9% FLUSH
10.0000 mL | Freq: Once | INTRAVENOUS | Status: AC
  Administered 2023-04-22: 10 mL

## 2023-04-22 MED ORDER — HEPARIN SOD (PORK) LOCK FLUSH 100 UNIT/ML IV SOLN
500.0000 [IU] | Freq: Once | INTRAVENOUS | Status: AC
  Administered 2023-04-22: 500 [IU]

## 2023-04-22 NOTE — Progress Notes (Signed)
Per Dr.Iruku, pt did not meet parameters for prbc or plt infusion. Hbg 8.6 PLT 58. Advised pt to f/u as scheduled. Pt verbalized understanding.

## 2023-04-22 NOTE — Telephone Encounter (Signed)
CRITICAL VALUE STICKER  CRITICAL VALUE:   ANC 0.0  RECEIVER (on-site recipient of call): Daneil Dolin, LPN  DATE & TIME NOTIFIED: 04/22/23   11:28  MESSENGER (representative from lab): Heather  MD NOTIFIED: Rachel Moulds, MD and Namon Cirri, PA  TIME OF NOTIFICATION:  11:31  RESPONSE:

## 2023-04-24 ENCOUNTER — Other Ambulatory Visit: Payer: Self-pay

## 2023-04-24 DIAGNOSIS — D469 Myelodysplastic syndrome, unspecified: Secondary | ICD-10-CM

## 2023-04-25 ENCOUNTER — Other Ambulatory Visit: Payer: Self-pay

## 2023-04-25 ENCOUNTER — Encounter (HOSPITAL_COMMUNITY): Payer: Self-pay

## 2023-04-25 ENCOUNTER — Inpatient Hospital Stay: Payer: Medicare Other

## 2023-04-25 ENCOUNTER — Inpatient Hospital Stay: Payer: Medicare Other | Admitting: Physician Assistant

## 2023-04-25 ENCOUNTER — Other Ambulatory Visit: Payer: Self-pay | Admitting: *Deleted

## 2023-04-25 ENCOUNTER — Inpatient Hospital Stay (HOSPITAL_COMMUNITY)
Admission: AD | Admit: 2023-04-25 | Discharge: 2023-04-29 | DRG: 809 | Disposition: A | Discharge status: Home / Self Care | Payer: Medicare Other | Source: Ambulatory Visit | Attending: Internal Medicine | Admitting: Internal Medicine

## 2023-04-25 ENCOUNTER — Other Ambulatory Visit: Payer: Self-pay | Admitting: Physician Assistant

## 2023-04-25 ENCOUNTER — Encounter: Payer: Self-pay | Admitting: Hematology and Oncology

## 2023-04-25 VITALS — BP 124/62 | HR 91 | Temp 100.0°F | Resp 18

## 2023-04-25 DIAGNOSIS — J45909 Unspecified asthma, uncomplicated: Secondary | ICD-10-CM | POA: Diagnosis present

## 2023-04-25 DIAGNOSIS — L409 Psoriasis, unspecified: Secondary | ICD-10-CM | POA: Diagnosis present

## 2023-04-25 DIAGNOSIS — E876 Hypokalemia: Secondary | ICD-10-CM | POA: Diagnosis present

## 2023-04-25 DIAGNOSIS — D61818 Other pancytopenia: Secondary | ICD-10-CM

## 2023-04-25 DIAGNOSIS — Z9109 Other allergy status, other than to drugs and biological substances: Secondary | ICD-10-CM

## 2023-04-25 DIAGNOSIS — Z7951 Long term (current) use of inhaled steroids: Secondary | ICD-10-CM

## 2023-04-25 DIAGNOSIS — D46Z Other myelodysplastic syndromes: Secondary | ICD-10-CM | POA: Diagnosis not present

## 2023-04-25 DIAGNOSIS — D696 Thrombocytopenia, unspecified: Secondary | ICD-10-CM

## 2023-04-25 DIAGNOSIS — K224 Dyskinesia of esophagus: Secondary | ICD-10-CM | POA: Diagnosis present

## 2023-04-25 DIAGNOSIS — G629 Polyneuropathy, unspecified: Secondary | ICD-10-CM | POA: Diagnosis present

## 2023-04-25 DIAGNOSIS — D849 Immunodeficiency, unspecified: Secondary | ICD-10-CM | POA: Diagnosis present

## 2023-04-25 DIAGNOSIS — R5081 Fever presenting with conditions classified elsewhere: Secondary | ICD-10-CM

## 2023-04-25 DIAGNOSIS — R079 Chest pain, unspecified: Secondary | ICD-10-CM

## 2023-04-25 DIAGNOSIS — Z820 Family history of epilepsy and other diseases of the nervous system: Secondary | ICD-10-CM

## 2023-04-25 DIAGNOSIS — Z87891 Personal history of nicotine dependence: Secondary | ICD-10-CM

## 2023-04-25 DIAGNOSIS — T451X5A Adverse effect of antineoplastic and immunosuppressive drugs, initial encounter: Secondary | ICD-10-CM | POA: Diagnosis present

## 2023-04-25 DIAGNOSIS — J411 Mucopurulent chronic bronchitis: Secondary | ICD-10-CM

## 2023-04-25 DIAGNOSIS — Z825 Family history of asthma and other chronic lower respiratory diseases: Secondary | ICD-10-CM

## 2023-04-25 DIAGNOSIS — I10 Essential (primary) hypertension: Secondary | ICD-10-CM | POA: Diagnosis present

## 2023-04-25 DIAGNOSIS — R0602 Shortness of breath: Secondary | ICD-10-CM

## 2023-04-25 DIAGNOSIS — R309 Painful micturition, unspecified: Secondary | ICD-10-CM

## 2023-04-25 DIAGNOSIS — Z95828 Presence of other vascular implants and grafts: Secondary | ICD-10-CM

## 2023-04-25 DIAGNOSIS — L4050 Arthropathic psoriasis, unspecified: Secondary | ICD-10-CM | POA: Diagnosis present

## 2023-04-25 DIAGNOSIS — Z7952 Long term (current) use of systemic steroids: Secondary | ICD-10-CM

## 2023-04-25 DIAGNOSIS — Z91040 Latex allergy status: Secondary | ICD-10-CM

## 2023-04-25 DIAGNOSIS — D709 Neutropenia, unspecified: Principal | ICD-10-CM | POA: Diagnosis present

## 2023-04-25 DIAGNOSIS — Z79899 Other long term (current) drug therapy: Secondary | ICD-10-CM

## 2023-04-25 DIAGNOSIS — D701 Agranulocytosis secondary to cancer chemotherapy: Principal | ICD-10-CM | POA: Diagnosis present

## 2023-04-25 DIAGNOSIS — R300 Dysuria: Secondary | ICD-10-CM | POA: Diagnosis present

## 2023-04-25 DIAGNOSIS — E039 Hypothyroidism, unspecified: Secondary | ICD-10-CM | POA: Diagnosis present

## 2023-04-25 DIAGNOSIS — Z8261 Family history of arthritis: Secondary | ICD-10-CM

## 2023-04-25 DIAGNOSIS — Z79890 Hormone replacement therapy (postmenopausal): Secondary | ICD-10-CM

## 2023-04-25 DIAGNOSIS — R1310 Dysphagia, unspecified: Secondary | ICD-10-CM

## 2023-04-25 DIAGNOSIS — D469 Myelodysplastic syndrome, unspecified: Secondary | ICD-10-CM

## 2023-04-25 DIAGNOSIS — D61810 Antineoplastic chemotherapy induced pancytopenia: Secondary | ICD-10-CM | POA: Diagnosis present

## 2023-04-25 LAB — CMP (CANCER CENTER ONLY)
ALT: 6 U/L (ref 0–44)
AST: 12 U/L — ABNORMAL LOW (ref 15–41)
Albumin: 3.7 g/dL (ref 3.5–5.0)
Alkaline Phosphatase: 45 U/L (ref 38–126)
Anion gap: 5 (ref 5–15)
BUN: 10 mg/dL (ref 8–23)
CO2: 26 mmol/L (ref 22–32)
Calcium: 8.8 mg/dL — ABNORMAL LOW (ref 8.9–10.3)
Chloride: 106 mmol/L (ref 98–111)
Creatinine: 0.45 mg/dL (ref 0.44–1.00)
GFR, Estimated: >60 mL/min (ref >60)
Glucose, Bld: 100 mg/dL — ABNORMAL HIGH (ref 70–99)
Potassium: 3.4 mmol/L — ABNORMAL LOW (ref 3.5–5.1)
Sodium: 137 mmol/L (ref 135–145)
Total Bilirubin: 0.6 mg/dL (ref <1.2)
Total Protein: 7 g/dL (ref 6.5–8.1)

## 2023-04-25 LAB — CBC WITH DIFFERENTIAL (CANCER CENTER ONLY)
Abs Immature Granulocytes: 0 10*3/uL (ref 0.00–0.07)
Basophils Absolute: 0 10*3/uL (ref 0.0–0.1)
Basophils Relative: 0 %
Eosinophils Absolute: 0 10*3/uL (ref 0.0–0.5)
Eosinophils Relative: 0 %
HCT: 26.5 % — ABNORMAL LOW (ref 36.0–46.0)
Hemoglobin: 8.5 g/dL — ABNORMAL LOW (ref 12.0–15.0)
Immature Granulocytes: 0 %
Lymphocytes Relative: 95 %
Lymphs Abs: 1 10*3/uL (ref 0.7–4.0)
MCH: 30.7 pg (ref 26.0–34.0)
MCHC: 32.1 g/dL (ref 30.0–36.0)
MCV: 95.7 fL (ref 80.0–100.0)
Monocytes Absolute: 0 10*3/uL — ABNORMAL LOW (ref 0.1–1.0)
Monocytes Relative: 3 %
Neutro Abs: 0 10*3/uL — CL (ref 1.7–7.7)
Neutrophils Relative %: 2 %
Platelet Count: 53 10*3/uL — ABNORMAL LOW (ref 150–400)
RBC: 2.77 MIL/uL — ABNORMAL LOW (ref 3.87–5.11)
RDW: 22 % — ABNORMAL HIGH (ref 11.5–15.5)
Smear Review: NORMAL
WBC Count: 1 10*3/uL — ABNORMAL LOW (ref 4.0–10.5)
nRBC: 1.9 % — ABNORMAL HIGH (ref 0.0–0.2)

## 2023-04-25 LAB — URINALYSIS, COMPLETE (UACMP) WITH MICROSCOPIC
Bacteria, UA: NONE SEEN
Bilirubin Urine: NEGATIVE
Glucose, UA: NEGATIVE mg/dL
Hgb urine dipstick: NEGATIVE
Ketones, ur: NEGATIVE mg/dL
Leukocytes,Ua: NEGATIVE
Nitrite: NEGATIVE
Protein, ur: 30 mg/dL — AB
Specific Gravity, Urine: 1.027 (ref 1.005–1.030)
pH: 5 (ref 5.0–8.0)

## 2023-04-25 LAB — RESP PANEL BY RT-PCR (RSV, FLU A&B, COVID)  RVPGX2
Influenza A by PCR: NEGATIVE
Influenza B by PCR: NEGATIVE
Resp Syncytial Virus by PCR: NEGATIVE
SARS Coronavirus 2 by RT PCR: NEGATIVE

## 2023-04-25 LAB — MAGNESIUM: Magnesium: 2 mg/dL (ref 1.7–2.4)

## 2023-04-25 LAB — SAMPLE TO BLOOD BANK

## 2023-04-25 MED ORDER — SODIUM CHLORIDE 0.9 % IV SOLN: Freq: Once | INTRAVENOUS | Status: DC

## 2023-04-25 MED ORDER — ACETAMINOPHEN 325 MG PO TABS
650.0000 mg | ORAL_TABLET | Freq: Four times a day (QID) | ORAL | Status: DC | PRN
Start: 2023-04-25 — End: 2023-04-25
  Administered 2023-04-25: 650 mg via ORAL
  Filled 2023-04-25 (×2): qty 2

## 2023-04-25 MED ORDER — SODIUM CHLORIDE 0.9 % IV SOLN
2.0000 g | Freq: Three times a day (TID) | INTRAVENOUS | Status: DC
  Administered 2023-04-25 – 2023-04-29 (×11): 2 g via INTRAVENOUS
  Filled 2023-04-25 (×11): qty 12.5

## 2023-04-25 MED ORDER — VANCOMYCIN HCL 1500 MG/300ML IV SOLN
1500.0000 mg | Freq: Once | INTRAVENOUS | Status: AC
  Administered 2023-04-25: 1500 mg via INTRAVENOUS
  Filled 2023-04-25: qty 300

## 2023-04-25 MED ORDER — SODIUM CHLORIDE 0.9 % IV SOLN: INTRAVENOUS | Status: DC

## 2023-04-25 MED ORDER — TRAZODONE HCL 50 MG PO TABS: 25.0000 mg | ORAL_TABLET | Freq: Every evening | ORAL | Status: DC | PRN

## 2023-04-25 MED ORDER — CEFEPIME HCL 2 G IV SOLR
2.0000 g | Freq: Once | INTRAVENOUS | Status: AC
  Administered 2023-04-25: 2 g via INTRAVENOUS
  Filled 2023-04-25: qty 12.5

## 2023-04-25 MED ORDER — ACETAMINOPHEN 650 MG RE SUPP: 650.0000 mg | Freq: Four times a day (QID) | RECTAL | Status: DC | PRN

## 2023-04-25 MED ORDER — ONDANSETRON HCL 4 MG PO TABS: 4.0000 mg | ORAL_TABLET | Freq: Four times a day (QID) | ORAL | Status: DC | PRN

## 2023-04-25 MED ORDER — GABAPENTIN 400 MG PO CAPS
800.0000 mg | ORAL_CAPSULE | Freq: Two times a day (BID) | ORAL | Status: DC
  Administered 2023-04-25 – 2023-04-29 (×8): 800 mg via ORAL
  Filled 2023-04-25 (×9): qty 2

## 2023-04-25 MED ORDER — ACETAMINOPHEN 325 MG PO TABS
650.0000 mg | ORAL_TABLET | Freq: Four times a day (QID) | ORAL | Status: DC | PRN
  Administered 2023-04-25 – 2023-04-29 (×11): 650 mg via ORAL
  Filled 2023-04-25 (×11): qty 2

## 2023-04-25 MED ORDER — ONDANSETRON HCL 4 MG/2ML IJ SOLN: 4.0000 mg | Freq: Four times a day (QID) | INTRAMUSCULAR | Status: DC | PRN

## 2023-04-25 MED ORDER — LEVOTHYROXINE SODIUM 100 MCG PO TABS
100.0000 ug | ORAL_TABLET | Freq: Every day | ORAL | Status: DC
Start: 2023-04-26 — End: 2023-04-29
  Administered 2023-04-26 – 2023-04-29 (×4): 100 ug via ORAL
  Filled 2023-04-25 (×4): qty 1

## 2023-04-25 MED ORDER — ALBUTEROL SULFATE (2.5 MG/3ML) 0.083% IN NEBU: 2.5000 mg | INHALATION_SOLUTION | RESPIRATORY_TRACT | Status: DC | PRN

## 2023-04-25 MED ORDER — SODIUM CHLORIDE 0.9% FLUSH
10.0000 mL | Freq: Once | INTRAVENOUS | Status: AC
  Administered 2023-04-25: 10 mL

## 2023-04-25 MED ORDER — VANCOMYCIN HCL 1500 MG/300ML IV SOLN
1500.0000 mg | INTRAVENOUS | Status: DC
  Administered 2023-04-26 – 2023-04-28 (×3): 1500 mg via INTRAVENOUS
  Filled 2023-04-25 (×4): qty 300

## 2023-04-25 NOTE — Progress Notes (Signed)
Symptom Management Consult Note [facility]    Patient Care Team: [name], [name], MD as PCP - General (Internal Medicine)    Name / MRN / DOB: [name] [name]  [id number]  [birth year]   Date of visit: 04/25/2023   Chief Complaint/Reason for visit: urinary frequency and fatigue   Current Therapy: Decitabine  Last treatment:  Day 5   Cycle 2 on 04/05/23   ASSESSMENT & PLAN: Patient is a 77 y.o. female with oncologic history of MDS followed by Dr. Al Pimple.  I have viewed most recent oncology note and lab work.    #MDS - Next appointment with oncologist is 05/27/23   #Neutropenic fever -Patient having subjective fevers at home. T max in clinic 100.0. Tylenol administered. -Work up today with WBC 1.0, HgB 8.5, platelets 53k, ANC 0.0. UA is not suggestive of infection. Urine culture in process. CMP overall unremarkable. -With concern for fever of unknown source in setting of severe neutropenia patient will require hospital admission for IV antibiotics. Blood cultures collected and cefepime started afterwards. Covid test is in process. -Oncologist updated and agrees with plan.  Spoke with Dr. Erenest Blank with hospitalist service who agrees to assume care of patient and bring into the hospital for further evaluation and management.       Heme/Onc History: Oncology History  MDS (myelodysplastic syndrome), high grade (HCC)  01/07/2023 Initial Biopsy   Bone marrow biopsy:cellular bone marrow with focal areas of hypercellularity (approximately up to  50%).  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  Blasts appear mildly mildly increased, approximately 5% to focally up to 10%.  Flow cytometric analysis reveals 5% CD34 positive blasts.  While the findings could be attributable to the patient's coexisting conditions, the possibility of a myeloid neoplasm such as myelodysplastic neoplasm with increased blasts cannot be entirely excluded. normal karyotype   01/29/2023  Initial Diagnosis   MDS (myelodysplastic syndrome), high grade (HCC)   02/04/2023 - 02/06/2023 Chemotherapy   Patient is on Treatment Plan : MYELODYSPLASIA  Azacitidine IV D1-5 q28d     03/04/2023 -  Chemotherapy   Patient is on Treatment Plan : MYELODYSPLASIA Decitabine D1-5 q28d         Interval history-:  Discussed the use of AI scribe software for clinical note transcription with the patient, who gave verbal consent to proceed.  [name] [name] is a 77 y.o. female with oncologic history as above presenting to [facility] today with chief complaint of extreme fatigue and dysuria. She presents to clinic unaccompanied today.   She also reports a burning sensation during urination x 3 days and lower back pain that has been ongoing for about a week.The patient has a history of UTI, last was 03/22/23 and she was treated with course of Bactrim. The symptoms of the current illness are somewhat similar to her previous UTIs, but the patient notes a difference in the level and type of fatigue experienced. Patient is also reporting subjective fevers. She does not have a thermometer at home though has woken up drenched in sweat multiple times and felt warm to the touch. She has been managing subjective fevers with tylenol, last dose was early this morning. She denies any sick contacts. She denies cough, abdominal pain.  The patient also reports significant nausea, which has affected her appetite, leading to decreased food intake. She has been managing the nausea with zofran. She last took it this morning. Despite the reduced appetite, the patient has been maintaining  hydration with water and ginger ale. Patient adds that she is experiencing extreme fatigue and spending most of her time in bed. This fatigue feels more intense than when she typically needs a blood transfusion.  ROS  All other systems are reviewed and are negative for acute change except as noted in the HPI.    Allergies  Allergen  Reactions   Latex Itching and Rash   Cat Hair Extract Other (See Comments)   Dust Mite Extract Other (See Comments)   Molds & Smuts     Other reaction(s): Unknown   Other     Other reaction(s): Unknown Other reaction(s): Unknown   Pollen Extract-Tree Extract [Pollen Extract] Other (See Comments)     Past Medical History:  Diagnosis Date   Asthma    Diverticulitis    Hypothyroid    Psoriatic arthritis (HCC)      Past Surgical History:  Procedure Laterality Date   COLONOSCOPY  08/2021   HIP SURGERY Bilateral    IR IMAGING GUIDED PORT INSERTION  02/20/2023   TONSILLECTOMY     TUBAL LIGATION      Social History   Socioeconomic History   Marital status: Widowed    Spouse name: Not on file   Number of children: Not on file   Years of education: Not on file   Highest education level: Not on file  Occupational History   Not on file  Tobacco Use   Smoking status: Former    Current packs/day: 0.00    Average packs/day: 0.1 packs/day for 33.4 years (3.3 ttl pk-yrs)    Types: Cigarettes    Start date: 55    Quit date: 11/18/2000    Years since quitting: 22.4   Smokeless tobacco: Never   Tobacco comments:    3 cigarettes a day  Vaping Use   Vaping status: Never Used  Substance and Sexual Activity   Alcohol use: Never   Drug use: Never   Sexual activity: Not Currently  Other Topics Concern   Not on file  Social History Narrative   Lives with children   Social Determinants of Health   Financial Resource Strain: Not on file  Food Insecurity: No Food Insecurity (11/24/2022)   Hunger Vital Sign    Worried About Running Out of Food in the Last Year: Never true    Ran Out of Food in the Last Year: Never true  Transportation Needs: No Transportation Needs (11/24/2022)   PRAPARE - Administrator, Civil Service (Medical): No    Lack of Transportation (Non-Medical): No  Physical Activity: Not on file  Stress: Not on file  Social Connections: Not on file   Intimate Partner Violence: Not At Risk (11/24/2022)   Humiliation, Afraid, Rape, and Kick questionnaire    Fear of Current or Ex-Partner: No    Emotionally Abused: No    Physically Abused: No    Sexually Abused: No    Family History  Problem Relation Age of Onset   Asthma Mother    Allergic rhinitis Mother    Rheum arthritis Mother    Allergic rhinitis Father    Alzheimer's disease Father    Eczema Brother    Allergic rhinitis Brother    Colon cancer Neg Hx    Esophageal cancer Neg Hx    Stomach cancer Neg Hx    Rectal cancer Neg Hx      Current Outpatient Medications:    albuterol (VENTOLIN HFA) 108 (90 Base) MCG/ACT inhaler, Inhale  2 puffs into the lungs every 6 (six) hours as needed for wheezing or shortness of breath., Disp: 8 g, Rfl: 6   Azelastine-Fluticasone 137-50 MCG/ACT SUSP, Place 1 spray into the nose 2 (two) times daily as needed., Disp: 23 g, Rfl: 5   b complex vitamins tablet, Take 1 tablet by mouth daily., Disp: , Rfl:    budesonide-formoterol (SYMBICORT) 80-4.5 MCG/ACT inhaler, Inhale 2 puffs into the lungs in the morning and at bedtime., Disp: 1 each, Rfl: 5   Cholecalciferol (VITAMIN D3) 25 MCG (1000 UT) CAPS, Take by mouth., Disp: , Rfl:    ciprofloxacin (CIPRO) 250 MG tablet, Take 1 tablet (250 mg total) by mouth 2 (two) times daily., Disp: 10 tablet, Rfl: 0   EPINEPHrine 0.3 mg/0.3 mL IJ SOAJ injection, Inject 0.3 mg into the muscle as needed for anaphylaxis., Disp: 2 each, Rfl: 2   famotidine (PEPCID) 40 MG tablet, Take 1 tablet (40 mg total) by mouth daily., Disp: 30 tablet, Rfl: 5   gabapentin (NEURONTIN) 800 MG tablet, 1 tablet, Disp: , Rfl:    Golimumab (SIMPONI ARIA IV), Inject 206.4 mg into the vein as directed. Once every 8 weeks, Disp: , Rfl:    hydrOXYzine (VISTARIL) 25 MG capsule, Take 1 capsule (25 mg total) by mouth 2 (two) times daily as needed., Disp: 30 capsule, Rfl: 0   influenza vac split quadrivalent PF (FLUARIX) 0.5 ML injection, ADM 0.5ML  IM UTD, Disp: , Rfl:    ketoconazole (NIZORAL) 2 % cream, Apply to bottoms of feet for 2-3 weeks, Disp: 60 g, Rfl: 2   levothyroxine (SYNTHROID) 100 MCG tablet, Take 100 mcg by mouth every morning., Disp: , Rfl:    Magnesium 250 MG TABS, Take 250 mg by mouth daily., Disp: , Rfl:    montelukast (SINGULAIR) 10 MG tablet, TAKE 1 TABLET(10 MG) BY MOUTH AT BEDTIME, Disp: 30 tablet, Rfl: 3   pantoprazole (PROTONIX) 40 MG tablet, TAKE 1 TABLET(40 MG) BY MOUTH TWICE DAILY BEFORE A MEAL, Disp: 180 tablet, Rfl: 0   predniSONE (DELTASONE) 10 MG tablet, Take 1 tablet (10 mg total) by mouth daily with breakfast., Disp: 15 tablet, Rfl: 1   traMADol (ULTRAM) 50 MG tablet, , Disp: , Rfl:    triamcinolone ointment (KENALOG) 0.1 %, Apply 1 Application topically 2 (two) times daily., Disp: 80 g, Rfl: 2  Current Facility-Administered Medications:    0.9 %  sodium chloride infusion, 500 mL, Intravenous, Once, Tressia Danas, MD   omalizumab Geoffry Paradise) injection 375 mg, 375 mg, Subcutaneous, Q14 Days, Ellamae Sia, DO, 375 mg at 04/24/21 1516   omalizumab Geoffry Paradise) prefilled syringe 375 mg, 375 mg, Subcutaneous, Q14 Days, Alfonse Spruce, MD, 375 mg at 05/15/21 1017  Facility-Administered Medications Ordered in Other Visits:    0.9 %  sodium chloride infusion (Manually program via Guardrails IV Fluids), 250 mL, Intravenous, Continuous, Iruku, Praveena, MD, Stopped at 04/17/23 1657   0.9 %  sodium chloride infusion, , Intravenous, Continuous, Walisiewicz, Caroleen Hamman, PA-C, Last Rate: 250 mL/hr at 04/25/23 1128, New Bag at 04/25/23 1128   acetaminophen (TYLENOL) tablet 650 mg, 650 mg, Oral, Q6H PRN, Walisiewicz, Jamaurion Slemmer E, PA-C, 650 mg at 04/25/23 1312   ceFEPIme (MAXIPIME) 2 g in sodium chloride 0.9 % 100 mL IVPB, 2 g, Intravenous, Once, Walisiewicz, Jmichael Gille E, PA-C  PHYSICAL EXAM: ECOG FS:2 - Symptomatic, <50% confined to bed    Vitals:   04/25/23 1120  BP: 124/62  Pulse: 91  Resp: 18  Temp:  99.3 F  (37.4 C)  SpO2: 97%   Physical Exam Vitals and nursing note reviewed.  Constitutional:      Appearance: She is not ill-appearing or toxic-appearing.  HENT:     Head: Normocephalic.  Eyes:     Conjunctiva/sclera: Conjunctivae normal.  Cardiovascular:     Rate and Rhythm: Normal rate and regular rhythm.     Pulses: Normal pulses.     Heart sounds: Normal heart sounds.  Pulmonary:     Effort: Pulmonary effort is normal.     Breath sounds: Normal breath sounds.  Chest:     Comments: PAC in place without signs of infection  Abdominal:     General: There is no distension.     Palpations: Abdomen is soft.     Tenderness: There is no abdominal tenderness. There is no right CVA tenderness, left CVA tenderness or guarding.  Musculoskeletal:     Cervical back: Normal range of motion.  Skin:    General: Skin is warm and dry.  Neurological:     Mental Status: She is alert.       LABORATORY DATA: I have reviewed the data as listed    Latest Ref Rng & Units 04/25/2023   10:49 AM 04/22/2023   10:20 AM 04/19/2023   11:15 AM  CBC  WBC 4.0 - 10.5 K/uL 1.0  1.0  1.4   Hemoglobin 12.0 - 15.0 g/dL 8.5  8.6  8.5   Hematocrit 36.0 - 46.0 % 26.5  27.0  26.9   Platelets 150 - 400 K/uL 53  58  18         Latest Ref Rng & Units 04/25/2023   10:49 AM 04/12/2023    8:31 AM 04/01/2023    1:04 PM  CMP  Glucose 70 - 99 mg/dL 308  94  657   BUN 8 - 23 mg/dL 10  10  10    Creatinine 0.44 - 1.00 mg/dL 8.46  9.62  9.52   Sodium 135 - 145 mmol/L 137  141  138   Potassium 3.5 - 5.1 mmol/L 3.4  4.1  3.9   Chloride 98 - 111 mmol/L 106  108  108   CO2 22 - 32 mmol/L 26  29  25    Calcium 8.9 - 10.3 mg/dL 8.8  8.9  9.0   Total Protein 6.5 - 8.1 g/dL 7.0  6.8  6.8   Total Bilirubin <1.2 mg/dL 0.6  0.5  0.5   Alkaline Phos 38 - 126 U/L 45  49  52   AST 15 - 41 U/L 12  13  15    ALT 0 - 44 U/L 6  8  7         RADIOGRAPHIC STUDIES (from last 24 hours if applicable) I have personally reviewed the  radiological images as listed and agreed with the findings in the report. No results found.      Visit Diagnosis: 1. MDS (myelodysplastic syndrome), high grade (HCC)   2. Neutropenic fever (HCC)      Orders Placed This Encounter  Procedures   Culture, blood (Routine X 2) w Reflex to ID Panel    Standing Status:   Standing    Number of Occurrences:   2    Order Specific Question:   Patient immune status    Answer:   Immunocompromised   Culture, blood (Routine X 2) w Reflex to ID Panel    Standing Status:   Standing    Number of  Occurrences:   2    Order Specific Question:   Patient immune status    Answer:   Immunocompromised   Magnesium    Standing Status:   Future    Number of Occurrences:   1    Standing Expiration Date:   04/24/2024    All questions were answered. The patient knows to call the clinic with any problems, questions or concerns. No barriers to learning was detected.  A total of more than 40 minutes were spent on this encounter with face-to-face time and non-face-to-face time, including preparing to see the patient, ordering tests and/or medications, counseling the patient and coordination of care as outlined above.    Thank you for allowing me to participate in the care of this patient.    Shanon Ace, PA-C Department of Hematology/Oncology [facility] at [facility] Phone: [phone]  [phone]    04/25/2023 1:54 PM

## 2023-04-25 NOTE — H&P (Signed)
History and Physical  [name] [name] ZOX:[id number] DOB: 1945/10/09 DOA: 04/25/2023  PCP: Gaspar Garbe, MD   Chief Complaint: Fever, sweats  HPI: [name] [name] is a 77 y.o. female with medical history significant for hypothyroidism, psoriatic arthritis, myelodysplasia currently on chemotherapy decitabine under the care of Dr. Al Pimple being admitted to the hospital with neutropenic fever.  Patient has been in her usual state of health until 2 or 3 mornings ago, when she started to feel more lethargic than usual.  She also woke up that morning in a sweat.  Felt tired the rest of the day, and the following day she slept until noon which is very abnormal for her.  She continued to feel hot in her face, and have subjective fevers.  Did not check her temperature.  For the last several days, she has also been having some dysuria.  Otherwise, denies cough, shortness of breath, nausea, vomiting, sick contacts, rashes on the skin, etc.  Went to the oncology clinic today with complaints of fatigue and dysuria.  Temperature in the oncology clinic was 100.0.  Lab work shows neutropenia.  Urinalysis unremarkable.  She was given Tylenol as well as empiric IV cefepime in clinic, after peripheral blood cultures were drawn.  Review of Systems: Please see HPI for pertinent positives and negatives. A complete 10 system review of systems are otherwise negative.  Past Medical History:  Diagnosis Date   Asthma    Diverticulitis    Hypothyroid    Psoriatic arthritis (HCC)    Past Surgical History:  Procedure Laterality Date   COLONOSCOPY  08/2021   HIP SURGERY Bilateral    IR IMAGING GUIDED PORT INSERTION  02/20/2023   TONSILLECTOMY     TUBAL LIGATION      Social History:  reports that she quit smoking about 22 years ago. Her smoking use included cigarettes. She started smoking about 55 years ago. She has a 3.3 pack-year smoking history. She has never used smokeless tobacco. She reports that she  does not drink alcohol and does not use drugs.   Allergies  Allergen Reactions   Latex Itching and Rash   Cat Hair Extract Other (See Comments)   Dust Mite Extract Other (See Comments)   Molds & Smuts     Other reaction(s): Unknown   Other     Other reaction(s): Unknown Other reaction(s): Unknown   Pollen Extract-Tree Extract [Pollen Extract] Other (See Comments)    Family History  Problem Relation Age of Onset   Asthma Mother    Allergic rhinitis Mother    Rheum arthritis Mother    Allergic rhinitis Father    Alzheimer's disease Father    Eczema Brother    Allergic rhinitis Brother    Colon cancer Neg Hx    Esophageal cancer Neg Hx    Stomach cancer Neg Hx    Rectal cancer Neg Hx      Prior to Admission medications   Medication Sig Start Date End Date Taking? Authorizing Provider  albuterol (VENTOLIN HFA) 108 (90 Base) MCG/ACT inhaler Inhale 2 puffs into the lungs every 6 (six) hours as needed for wheezing or shortness of breath. 12/02/20   Charlott Holler, MD  Azelastine-Fluticasone [phone] MCG/ACT SUSP Place 1 spray into the nose 2 (two) times daily as needed. 02/15/22   Alfonse Spruce, MD  b complex vitamins tablet Take 1 tablet by mouth daily.    [provider]  budesonide-formoterol (SYMBICORT) 80-4.5 MCG/ACT inhaler Inhale 2 puffs  into the lungs in the morning and at bedtime. 10/26/21   Alfonse Spruce, MD  Cholecalciferol (VITAMIN D3) 25 MCG (1000 UT) CAPS Take by mouth. 04/24/11   [provider]  ciprofloxacin (CIPRO) 250 MG tablet Take 1 tablet (250 mg total) by mouth 2 (two) times daily. 03/08/23   Loa Socks, NP  EPINEPHrine 0.3 mg/0.3 mL IJ SOAJ injection Inject 0.3 mg into the muscle as needed for anaphylaxis. 11/15/22   Alfonse Spruce, MD  famotidine (PEPCID) 40 MG tablet Take 1 tablet (40 mg total) by mouth daily. 02/02/22   Unk Lightning, PA  gabapentin (NEURONTIN) 800 MG tablet 1 tablet    [provider]  Golimumab (SIMPONI ARIA IV) Inject 206.4 mg into the vein as directed. Once every 8 weeks    [provider]  hydrOXYzine (VISTARIL) 25 MG capsule Take 1 capsule (25 mg total) by mouth 2 (two) times daily as needed. 04/10/23   Rachel Moulds, MD  influenza vac split quadrivalent PF (FLUARIX) 0.5 ML injection ADM 0.5ML IM UTD    [provider]  ketoconazole (NIZORAL) 2 % cream Apply to bottoms of feet for 2-3 weeks 03/02/21   Delories Heinz, DPM  levothyroxine (SYNTHROID) 100 MCG tablet Take 100 mcg by mouth every morning. 02/20/21   [provider]  Magnesium 250 MG TABS Take 250 mg by mouth daily.    [provider]  montelukast (SINGULAIR) 10 MG tablet TAKE 1 TABLET(10 MG) BY MOUTH AT BEDTIME 02/05/23   Alfonse Spruce, MD  pantoprazole (PROTONIX) 40 MG tablet TAKE 1 TABLET(40 MG) BY MOUTH TWICE DAILY BEFORE A MEAL 09/11/21   Tressia Danas, MD  predniSONE (DELTASONE) 10 MG tablet Take 1 tablet (10 mg total) by mouth daily with breakfast. 04/01/23   Rachel Moulds, MD  traMADol Janean Sark) 50 MG tablet  01/26/20   [provider]  triamcinolone ointment (KENALOG) 0.1 % Apply 1 Application topically 2 (two) times daily. 03/18/23   Hetty Blend, FNP    Physical Exam: BP (!) 112/53 (BP Location: Right Arm)   Pulse 75   Temp 98.4 F (36.9 C) (Oral)   Ht 5\' 8"  (1.727 m)   Wt 83.3 kg   SpO2 100%   BMI 27.92 kg/m   General:  Alert, oriented, calm, in no acute distress, her daughter is at the bedside.  She has a right anterior chest port Eyes: EOMI, clear conjuctivae, white sclerea Neck: supple, no masses, trachea mildline  Cardiovascular: RRR, no murmurs or rubs, no peripheral edema  Respiratory: clear to auscultation bilaterally, no wheezes, no crackles  Abdomen: soft, nontender, nondistended, normal bowel tones heard  Skin: dry, no rashes  Musculoskeletal: no joint effusions, normal range of motion  Psychiatric:  appropriate affect, normal speech  Neurologic: extraocular muscles intact, clear speech, moving all extremities with intact sensorium         Labs on Admission:  Basic Metabolic Panel: Recent Labs  Lab 04/25/23 1049  NA 137  K 3.4*  CL 106  CO2 26  GLUCOSE 100*  BUN 10  CREATININE 0.45  CALCIUM 8.8*  MG 2.0   Liver Function Tests: Recent Labs  Lab 04/25/23 1049  AST 12*  ALT 6  ALKPHOS 45  BILITOT 0.6  PROT 7.0  ALBUMIN 3.7   No results for input(s): "LIPASE", "AMYLASE" in the last 168 hours. No results for input(s): "AMMONIA" in the last 168 hours. CBC: Recent Labs  Lab 04/19/23 1115  04/22/23 1020 04/25/23 1049  WBC 1.4* 1.0* 1.0*  NEUTROABS 0.1* 0.0* 0.0*  HGB 8.5* 8.6* 8.5*  HCT 26.9* 27.0* 26.5*  MCV 93.4 95.1 95.7  PLT 18* 58* 53*   Cardiac Enzymes: No results for input(s): "CKTOTAL", "CKMB", "CKMBINDEX", "TROPONINI" in the last 168 hours.  BNP (last 3 results) No results for input(s): "BNP" in the last 8760 hours.  ProBNP (last 3 results) No results for input(s): "PROBNP" in the last 8760 hours.  CBG: No results for input(s): "GLUCAP" in the last 168 hours.  Radiological Exams on Admission: No results found.  Assessment/Plan  [name] [name] is a 77 y.o. female with medical history significant for hypothyroidism, psoriatic arthritis, myelodysplasia currently on chemotherapy under the care of Dr. Al Pimple being admitted to the hospital with neutropenic fever.  Neutropenic fever-with subjective fevers at home, temperature 100.0 today and cancer clinic.  In the setting of neutropenia due to chemotherapy.  No source of infection identified.  She has dysuria, but urinalysis is unremarkable. -Observation admission to MedSurg -Neutropenic precautions -Follow-up blood and urine cultures obtained today in cancer clinic -Empiric IV vancomycin and IV cefepime  MDS-under the care of Dr. Al Pimple, receiving decitabine last dose on  04/05/2023  Hypothyroidism-Synthroid  Neuropathy-gabapentin  Thrombocytopenia and anemia-stable and chronic  DVT prophylaxis: SCDs only due to thrombocytopenia    Code Status: Full Code  Consults called: Her oncologist Dr. Al Pimple was added to treatment team  Admission status: Observation  Time spent: 28 minutes  Glenden Rossell Sharlette Dense MD [facility] Pager [phone]  If 7PM-7AM, please contact night-coverage [email or web address] Password [facility]  04/25/2023, 5:12 PM

## 2023-04-25 NOTE — Progress Notes (Signed)
   04/25/23 2029  TOC Brief Assessment  Insurance and Status Reviewed  Patient has primary care physician Yes (Dr. Darral Dash)  Home environment has been reviewed Yes (From Home)  Prior level of function: Independent  Prior/Current Home Services No current home services  Social Determinants of Health Reivew SDOH reviewed no interventions necessary  Readmission risk has been reviewed Yes (observation)  Transition of care needs no transition of care needs at this time

## 2023-04-25 NOTE — Progress Notes (Signed)
Pharmacy Antibiotic Note  [name] [name] is a 77 y.o. female admitted on 04/25/2023 with  febrile neutropenia .  Pharmacy has been consulted for Vanco, Cefepime dosing.  Active Problem(s): Fevers at home, night sweats, decreased appetite. - She also reports a burning sensation during urination x 3 days and lower back pain that has been ongoing for about a week.The patient has a history of UTI, last was 03/22/23 and she was treated with course of Bactrim   ID: FN - Tmax 100, WBC 1 (ANC 0), Scr <1  Vanco 11/7>> Cefepime 11/7>>  Plan: F/u blood cultures (may have been collected at cancer center) Cefepime 2g IV q8hr  Vanco 1500mg  IV x 1 Vancomycin 1500 mg IV Q 24 hrs. Goal AUC 400-550. Expected AUC: 527 SCr used: 0.8    Height: 5\' 8"  (172.7 cm) Weight: 83.3 kg (183 lb 10.3 oz) IBW/kg (Calculated) : 63.9  Temp (24hrs), Avg:99 F (37.2 C), Min:98.4 F (36.9 C), Max:100 F (37.8 C)  Recent Labs  Lab 04/19/23 1115 04/22/23 1020 04/25/23 1049  WBC 1.4* 1.0* 1.0*  CREATININE  --   --  0.45    Estimated Creatinine Clearance: 68.8 mL/min (by C-G formula based on SCr of 0.45 mg/dL).    Allergies  Allergen Reactions   Latex Itching and Rash   Bee Pollen     Other Reaction(s): Other (See Comments)   Cat Hair Extract Other (See Comments)   Dust Mite Extract Other (See Comments)    Other reaction(s): Unknown Other reaction(s): Unknown   Molds & Smuts     Other reaction(s): Unknown   Other     Other reaction(s): Unknown Other reaction(s): Unknown   Pollen Extract-Tree Extract [Pollen Extract] Other (See Comments)    Donnamae Muilenburg S. Merilynn Finland, PharmD, BCPS Clinical Staff Pharmacist [email or web address]  Pasty Spillers 04/25/2023 5:37 PM

## 2023-04-26 ENCOUNTER — Other Ambulatory Visit: Payer: Self-pay

## 2023-04-26 ENCOUNTER — Encounter: Payer: Self-pay | Admitting: Hematology and Oncology

## 2023-04-26 ENCOUNTER — Encounter (HOSPITAL_COMMUNITY): Payer: Self-pay | Admitting: Internal Medicine

## 2023-04-26 DIAGNOSIS — Z79890 Hormone replacement therapy (postmenopausal): Secondary | ICD-10-CM | POA: Diagnosis not present

## 2023-04-26 DIAGNOSIS — I10 Essential (primary) hypertension: Secondary | ICD-10-CM

## 2023-04-26 DIAGNOSIS — R1310 Dysphagia, unspecified: Secondary | ICD-10-CM | POA: Diagnosis not present

## 2023-04-26 DIAGNOSIS — E876 Hypokalemia: Secondary | ICD-10-CM | POA: Diagnosis present

## 2023-04-26 DIAGNOSIS — J45909 Unspecified asthma, uncomplicated: Secondary | ICD-10-CM | POA: Diagnosis present

## 2023-04-26 DIAGNOSIS — L4050 Arthropathic psoriasis, unspecified: Secondary | ICD-10-CM | POA: Diagnosis present

## 2023-04-26 DIAGNOSIS — Z8261 Family history of arthritis: Secondary | ICD-10-CM | POA: Diagnosis not present

## 2023-04-26 DIAGNOSIS — Z79899 Other long term (current) drug therapy: Secondary | ICD-10-CM | POA: Diagnosis not present

## 2023-04-26 DIAGNOSIS — K224 Dyskinesia of esophagus: Secondary | ICD-10-CM | POA: Diagnosis present

## 2023-04-26 DIAGNOSIS — D46Z Other myelodysplastic syndromes: Secondary | ICD-10-CM

## 2023-04-26 DIAGNOSIS — D701 Agranulocytosis secondary to cancer chemotherapy: Secondary | ICD-10-CM | POA: Diagnosis present

## 2023-04-26 DIAGNOSIS — E039 Hypothyroidism, unspecified: Secondary | ICD-10-CM | POA: Diagnosis present

## 2023-04-26 DIAGNOSIS — G629 Polyneuropathy, unspecified: Secondary | ICD-10-CM | POA: Diagnosis present

## 2023-04-26 DIAGNOSIS — R5081 Fever presenting with conditions classified elsewhere: Secondary | ICD-10-CM | POA: Diagnosis present

## 2023-04-26 DIAGNOSIS — D709 Neutropenia, unspecified: Secondary | ICD-10-CM | POA: Diagnosis not present

## 2023-04-26 DIAGNOSIS — D61810 Antineoplastic chemotherapy induced pancytopenia: Secondary | ICD-10-CM | POA: Diagnosis present

## 2023-04-26 DIAGNOSIS — Z87891 Personal history of nicotine dependence: Secondary | ICD-10-CM | POA: Diagnosis not present

## 2023-04-26 DIAGNOSIS — Z820 Family history of epilepsy and other diseases of the nervous system: Secondary | ICD-10-CM | POA: Diagnosis not present

## 2023-04-26 DIAGNOSIS — D849 Immunodeficiency, unspecified: Secondary | ICD-10-CM | POA: Diagnosis present

## 2023-04-26 DIAGNOSIS — T451X5A Adverse effect of antineoplastic and immunosuppressive drugs, initial encounter: Secondary | ICD-10-CM | POA: Diagnosis present

## 2023-04-26 DIAGNOSIS — Z9109 Other allergy status, other than to drugs and biological substances: Secondary | ICD-10-CM | POA: Diagnosis not present

## 2023-04-26 DIAGNOSIS — Z7952 Long term (current) use of systemic steroids: Secondary | ICD-10-CM | POA: Diagnosis not present

## 2023-04-26 DIAGNOSIS — Z95828 Presence of other vascular implants and grafts: Secondary | ICD-10-CM | POA: Diagnosis not present

## 2023-04-26 DIAGNOSIS — Z825 Family history of asthma and other chronic lower respiratory diseases: Secondary | ICD-10-CM | POA: Diagnosis not present

## 2023-04-26 DIAGNOSIS — Z7951 Long term (current) use of inhaled steroids: Secondary | ICD-10-CM | POA: Diagnosis not present

## 2023-04-26 DIAGNOSIS — Z91040 Latex allergy status: Secondary | ICD-10-CM | POA: Diagnosis not present

## 2023-04-26 LAB — BASIC METABOLIC PANEL
Anion gap: 7 (ref 5–15)
BUN: 8 mg/dL (ref 8–23)
CO2: 23 mmol/L (ref 22–32)
Calcium: 8.1 mg/dL — ABNORMAL LOW (ref 8.9–10.3)
Chloride: 105 mmol/L (ref 98–111)
Creatinine, Ser: 0.39 mg/dL — ABNORMAL LOW (ref 0.44–1.00)
GFR, Estimated: >60 mL/min (ref >60)
Glucose, Bld: 97 mg/dL (ref 70–99)
Potassium: 3.1 mmol/L — ABNORMAL LOW (ref 3.5–5.1)
Sodium: 135 mmol/L (ref 135–145)

## 2023-04-26 LAB — CBC
HCT: 23.1 % — ABNORMAL LOW (ref 36.0–46.0)
Hemoglobin: 7.4 g/dL — ABNORMAL LOW (ref 12.0–15.0)
MCH: 31.4 pg (ref 26.0–34.0)
MCHC: 32 g/dL (ref 30.0–36.0)
MCV: 97.9 fL (ref 80.0–100.0)
Platelets: 40 10*3/uL — ABNORMAL LOW (ref 150–400)
RBC: 2.36 MIL/uL — ABNORMAL LOW (ref 3.87–5.11)
RDW: 22.3 % — ABNORMAL HIGH (ref 11.5–15.5)
WBC: 0.9 10*3/uL — CL (ref 4.0–10.5)
nRBC: 0 % (ref 0.0–0.2)

## 2023-04-26 LAB — URINE CULTURE: Culture: NO GROWTH

## 2023-04-26 MED ORDER — POTASSIUM CHLORIDE CRYS ER 20 MEQ PO TBCR
40.0000 meq | EXTENDED_RELEASE_TABLET | ORAL | Status: AC
Start: 2023-04-26 — End: 2023-04-26
  Administered 2023-04-26 (×2): 40 meq via ORAL
  Filled 2023-04-26 (×2): qty 2

## 2023-04-26 MED ORDER — OXYCODONE HCL 5 MG PO TABS
2.5000 mg | ORAL_TABLET | ORAL | Status: DC | PRN
Start: 2023-04-26 — End: 2023-04-29
  Administered 2023-04-26: 2.5 mg via ORAL
  Administered 2023-04-27 – 2023-04-28 (×5): 5 mg via ORAL
  Filled 2023-04-26 (×6): qty 1

## 2023-04-26 MED ORDER — CHLORHEXIDINE GLUCONATE CLOTH 2 % EX PADS
6.0000 | MEDICATED_PAD | Freq: Every day | CUTANEOUS | Status: DC
  Administered 2023-04-26 – 2023-04-29 (×3): 6 via TOPICAL

## 2023-04-26 MED ORDER — SODIUM CHLORIDE 0.9% FLUSH
3.0000 mL | Freq: Two times a day (BID) | INTRAVENOUS | Status: DC
  Administered 2023-04-26 – 2023-04-29 (×6): 3 mL via INTRAVENOUS

## 2023-04-26 NOTE — Assessment & Plan Note (Signed)
04-26-2023 admitted on 04-25-2023 due to neutropenic fever. Discussed with heme/onc. No indications for G-CSF at this point. Pt has port-a-cath. Continue with cefepime/vanco for now. 04-27-2023 continue with vanco and cefepime. Awaiting blood cx for 3 days. Possible DC to home on Monday if blood cx remain negative. 04-28-2023. Stable. Blood cx remain negative. 04-29-2023 blood cx negative at 4 days. Medically stable for DC. Have communicated with heme/onc and will recommend pt not receive her scheduled chemo tomorrow.

## 2023-04-26 NOTE — Assessment & Plan Note (Signed)
04-26-2023 stable.

## 2023-04-26 NOTE — Assessment & Plan Note (Signed)
04-26-2023 replete with oral kcl. Repeat BMP, Mg in AM. 04-27-2023 give more po kcl.

## 2023-04-26 NOTE — Plan of Care (Signed)
  Problem: Education: Goal: Knowledge of General Education information will improve Description: Including pain rating scale, medication(s)/side effects and non-pharmacologic comfort measures Outcome: Progressing   Problem: Health Behavior/Discharge Planning: Goal: Ability to manage health-related needs will improve Outcome: Progressing   Problem: Clinical Measurements: Goal: Ability to maintain clinical measurements within normal limits will improve Outcome: Progressing Goal: Will remain free from infection Outcome: Progressing Goal: Diagnostic test results will improve Outcome: Progressing Goal: Respiratory complications will improve Outcome: Progressing Goal: Cardiovascular complication will be avoided Outcome: Progressing   Problem: Activity: Goal: Risk for activity intolerance will decrease Outcome: Progressing    Problem: Elimination: Goal: Will not experience complications related to bowel motility Outcome: Progressing     

## 2023-04-26 NOTE — Progress Notes (Signed)
PROGRESS NOTE    [name] [name]  KVQ:[id number] DOB: [birth year] DOA: 04/25/2023 PCP: Gaspar Garbe, MD  Subjective: No new subjective & objective note has been filed under this hospital service since the last note was generated.    Hospital Course: HPI: [name] [name] is a 77 y.o. female with medical history significant for hypothyroidism, psoriatic arthritis, myelodysplasia currently on chemotherapy decitabine under the care of Dr. Al Pimple being admitted to the hospital with neutropenic fever.  Patient has been in her usual state of health until 2 or 3 mornings ago, when she started to feel more lethargic than usual.  She also woke up that morning in a sweat.  Felt tired the rest of the day, and the following day she slept until noon which is very abnormal for her.  She continued to feel hot in her face, and have subjective fevers.  Did not check her temperature.  For the last several days, she has also been having some dysuria.  Otherwise, denies cough, shortness of breath, nausea, vomiting, sick contacts, rashes on the skin, etc.   Went to the oncology clinic today with complaints of fatigue and dysuria.  Temperature in the oncology clinic was 100.0.  Lab work shows neutropenia.  Urinalysis unremarkable.  She was given Tylenol as well as empiric IV cefepime in clinic, after peripheral blood cultures were drawn.  Significant Events: Admitted 04/25/2023 for neutropenic fever   Significant Labs: Admitting WBC 1, ANC 20, HgB 8.5, plt 53   Significant Imaging Studies:   Antibiotic Therapy: Anti-infectives (From admission, onward)    Start     Dose/Rate Route Frequency Ordered Stop   04/26/23 1800  vancomycin (VANCOREADY) IVPB 1500 mg/300 mL        1,500 mg 150 mL/hr over 120 Minutes Intravenous Every 24 hours 04/25/23 1736     04/25/23 2300  ceFEPIme (MAXIPIME) 2 g in sodium chloride 0.9 % 100 mL IVPB        2 g 200 mL/hr over 30 Minutes Intravenous Every 8 hours 04/25/23  1658     04/25/23 1745  vancomycin (VANCOREADY) IVPB 1500 mg/300 mL        1,500 mg 150 mL/hr over 120 Minutes Intravenous  Once 04/25/23 1657 04/25/23 2036       Procedures:   Consultants:     Assessment and Plan: * Neutropenic fever (HCC) 04-26-2023 admitted on 04-25-2023 due to neutropenic fever. Discussed with heme/onc. No indications for G-CSF at this point. Pt has port-a-cath. Continue with cefepime/vanco for now.  Pancytopenia due to chemotherapy [facility]) 04-26-2023 monitor for now.  Hypokalemia 04-26-2023 replete with oral kcl. Repeat BMP, Mg in AM.  MDS (myelodysplastic syndrome), high grade (HCC) 04-26-2023 follows with heme/onc. Had chemo last month.  Psoriasis 04-26-2023 stable  Hypothyroidism 04-26-2023 stable.  HTN (hypertension) 04-26-2023 stable.   DVT prophylaxis: SCDs Start: 04/25/23 1712    Code Status: Full Code Family Communication: no family at bedside. Pt is decisional Disposition Plan: return home Reason for continuing need for hospitalization: monitor blood cultures. Remains on IV abx for now.  Objective: Vitals:   04/25/23 2003 04/25/23 2354 04/26/23 0412 04/26/23 1311  BP: (!) 117/55 (!) 125/54 139/61 (!) 102/58  Pulse: 77 72 79 75  Resp: 16 18 17    Temp: (!) 100.5 F (38.1 C) 99 F (37.2 C) 99.4 F (37.4 C) 98.7 F (37.1 C)  TempSrc: Oral Oral Oral Oral  SpO2: 97% 98% 97% 94%  Weight:      Height:  Intake/Output Summary (Last 24 hours) at 04/26/2023 1312 Last data filed at 04/26/2023 0900 Gross per 24 hour  Intake 320 ml  Output --  Net 320 ml   Filed Weights   04/25/23 1629  Weight: 83.3 kg    Examination:  Physical Exam Vitals and nursing note reviewed.  Constitutional:      General: She is not in acute distress.    Appearance: She is not toxic-appearing or diaphoretic.  HENT:     Head: Normocephalic and atraumatic.     Nose: Nose normal.  Eyes:     General: No scleral icterus. Cardiovascular:     Rate and  Rhythm: Normal rate and regular rhythm.  Pulmonary:     Effort: Pulmonary effort is normal.     Breath sounds: Normal breath sounds.  Abdominal:     General: Bowel sounds are normal. There is no distension.     Palpations: Abdomen is soft.     Tenderness: There is no abdominal tenderness.  Musculoskeletal:     Right lower leg: No edema.     Left lower leg: No edema.  Skin:    General: Skin is warm and dry.     Capillary Refill: Capillary refill takes less than 2 seconds.     Comments: Right ant chest wall port-a-cath. No erythema.  Neurological:     General: No focal deficit present.     Mental Status: She is alert and oriented to person, place, and time.     Data Reviewed: I have personally reviewed following labs and imaging studies  CBC: Recent Labs  Lab 04/22/23 1020 04/25/23 1049 04/26/23 0500  WBC 1.0* 1.0* 0.9*  NEUTROABS 0.0* 0.0*  --   HGB 8.6* 8.5* 7.4*  HCT 27.0* 26.5* 23.1*  MCV 95.1 95.7 97.9  PLT 58* 53* 40*   Basic Metabolic Panel: Recent Labs  Lab 04/25/23 1049 04/26/23 0500  NA 137 135  K 3.4* 3.1*  CL 106 105  CO2 26 23  GLUCOSE 100* 97  BUN 10 8  CREATININE 0.45 0.39*  CALCIUM 8.8* 8.1*  MG 2.0  --    GFR: Estimated Creatinine Clearance: 68.8 mL/min (A) (by C-G formula based on SCr of 0.39 mg/dL (L)). Liver Function Tests: Recent Labs  Lab 04/25/23 1049  AST 12*  ALT 6  ALKPHOS 45  BILITOT 0.6  PROT 7.0  ALBUMIN 3.7    Recent Results (from the past 240 hour(s))  Urine Culture     Status: None   Collection Time: 04/25/23 12:07 PM   Specimen: Urine, Clean Catch  Result Value Ref Range Status   Specimen Description   Final    URINE, CLEAN CATCH Performed at [facility], [address] [place], [place] [address]    Special Requests   Final    NONE Performed at [facility], [address], [place], [place] [address]    Culture   Final    NO GROWTH Performed at [facility], [address], [place], [place] [address]    Report Status 04/26/2023 FINAL  Final  Resp panel by RT-PCR (RSV, Flu A&B, Covid) Anterior Nasal Swab     Status: None   Collection Time: 04/25/23  1:00 PM   Specimen: Anterior Nasal Swab  Result Value Ref Range Status   SARS Coronavirus 2 by RT PCR NEGATIVE NEGATIVE Final    Comment: (NOTE) SARS-CoV-2 target nucleic acids are NOT DETECTED.  The SARS-CoV-2  RNA is generally detectable in upper respiratory specimens during the acute phase of infection. The lowest concentration of SARS-CoV-2 viral copies this assay can detect is 138 copies/mL. A negative result does not preclude SARS-Cov-2 infection and should not be used as the sole basis for treatment or other patient management decisions. A negative result may occur with  improper specimen collection/handling, submission of specimen other than nasopharyngeal swab, presence of viral mutation(s) within the areas targeted by this assay, and inadequate number of viral copies(<138 copies/mL). A negative result must be combined with clinical observations, patient history, and epidemiological information. The expected result is Negative.  Fact Sheet for Patients:  BloggerCourse.com  Fact Sheet for Healthcare Providers:  SeriousBroker.it  This test is no t yet approved or cleared by the [place] FDA and  has been authorized for detection and/or diagnosis of SARS-CoV-2 by FDA under an Emergency Use Authorization (EUA). This EUA will remain  in effect (meaning this test can be used) for the duration of the COVID-19 declaration under Section 564(b)(1) of the Act, 21 U.S.C.section 360bbb-3(b)(1), unless the authorization is terminated  or revoked sooner.       Influenza A by PCR NEGATIVE NEGATIVE Final   Influenza B by PCR NEGATIVE NEGATIVE Final    Comment: (NOTE) The Xpert Xpress SARS-CoV-2/FLU/RSV plus assay is intended as an  aid in the diagnosis of influenza from Nasopharyngeal swab specimens and should not be used as a sole basis for treatment. Nasal washings and aspirates are unacceptable for Xpert Xpress SARS-CoV-2/FLU/RSV testing.  Fact Sheet for Patients: BloggerCourse.com  Fact Sheet for Healthcare Providers: SeriousBroker.it  This test is not yet approved or cleared by the [place] FDA and has been authorized for detection and/or diagnosis of SARS-CoV-2 by FDA under an Emergency Use Authorization (EUA). This EUA will remain in effect (meaning this test can be used) for the duration of the COVID-19 declaration under Section 564(b)(1) of the Act, 21 U.S.C. section 360bbb-3(b)(1), unless the authorization is terminated or revoked.     Resp Syncytial Virus by PCR NEGATIVE NEGATIVE Final    Comment: (NOTE) Fact Sheet for Patients: BloggerCourse.com  Fact Sheet for Healthcare Providers: SeriousBroker.it  This test is not yet approved or cleared by the [place] FDA and has been authorized for detection and/or diagnosis of SARS-CoV-2 by FDA under an Emergency Use Authorization (EUA). This EUA will remain in effect (meaning this test can be used) for the duration of the COVID-19 declaration under Section 564(b)(1) of the Act, 21 U.S.C. section 360bbb-3(b)(1), unless the authorization is terminated or revoked.  Performed at [facility], [address], [place], [place] [address]   Culture, blood (Routine X 2) w Reflex to ID Panel     Status: None (Preliminary result)   Collection Time: 04/25/23  1:44 PM   Specimen: BLOOD  Result Value Ref Range Status   Specimen Description   Final    BLOOD LEFT ANTECUBITAL Performed at [facility], [address], [place], [place] [address]    Special Requests   Final    BOTTLES DRAWN AEROBIC AND ANAEROBIC  Blood Culture results may not be optimal due to an excessive volume of blood received in culture bottles   Culture   Final    NO GROWTH < 12 HOURS Performed at [facility], [address] [place], [place] [address]    Report Status PENDING  Incomplete  Culture, blood (Routine X 2) w Reflex to ID  Panel     Status: None (Preliminary result)   Collection Time: 04/25/23  2:22 PM   Specimen: BLOOD  Result Value Ref Range Status   Specimen Description   Final    BLOOD RIGHT ANTECUBITAL Performed at [facility], [address], [place], [place] [address]    Special Requests   Final    BOTTLES DRAWN AEROBIC AND ANAEROBIC Blood Culture results may not be optimal due to an excessive volume of blood received in culture bottles   Culture   Final    NO GROWTH < 12 HOURS Performed at [facility], [address] [place], [place] [address]    Report Status PENDING  Incomplete     Radiology Studies: No results found.  Scheduled Meds:  Chlorhexidine Gluconate Cloth  6 each Topical Daily   gabapentin  800 mg Oral BID   levothyroxine  100 mcg Oral Q0600   potassium chloride  40 mEq Oral Q4H   sodium chloride flush  3 mL Intravenous Q12H   Continuous Infusions:  ceFEPime (MAXIPIME) IV 2 g (04/26/23 0606)   vancomycin       LOS: 0 days   Time spent: 45 minutes  Carollee Herter, DO  [facility]  04/26/2023, 1:12 PM

## 2023-04-26 NOTE — Hospital Course (Signed)
HPI: [name] [name] is a 77 y.o. female with medical history significant for hypothyroidism, psoriatic arthritis, myelodysplasia currently on chemotherapy decitabine under the care of Dr. Al Pimple being admitted to the hospital with neutropenic fever.  Patient has been in her usual state of health until 2 or 3 mornings ago, when she started to feel more lethargic than usual.  She also woke up that morning in a sweat.  Felt tired the rest of the day, and the following day she slept until noon which is very abnormal for her.  She continued to feel hot in her face, and have subjective fevers.  Did not check her temperature.  For the last several days, she has also been having some dysuria.  Otherwise, denies cough, shortness of breath, nausea, vomiting, sick contacts, rashes on the skin, etc.   Went to the oncology clinic today with complaints of fatigue and dysuria.  Temperature in the oncology clinic was 100.0.  Lab work shows neutropenia.  Urinalysis unremarkable.  She was given Tylenol as well as empiric IV cefepime in clinic, after peripheral blood cultures were drawn.  Significant Events: Admitted 04/25/2023 for neutropenic fever   Significant Labs: Admitting WBC 1, ANC 20, HgB 8.5, plt 53   Significant Imaging Studies:   Antibiotic Therapy: Anti-infectives (From admission, onward)    Start     Dose/Rate Route Frequency Ordered Stop   04/26/23 1800  vancomycin (VANCOREADY) IVPB 1500 mg/300 mL        1,500 mg 150 mL/hr over 120 Minutes Intravenous Every 24 hours 04/25/23 1736     04/25/23 2300  ceFEPIme (MAXIPIME) 2 g in sodium chloride 0.9 % 100 mL IVPB        2 g 200 mL/hr over 30 Minutes Intravenous Every 8 hours 04/25/23 1658     04/25/23 1745  vancomycin (VANCOREADY) IVPB 1500 mg/300 mL        1,500 mg 150 mL/hr over 120 Minutes Intravenous  Once 04/25/23 1657 04/25/23 2036       Procedures: 04-29-2023 MBS pt passed her MBS without difficulty. ST recommended OP GI  consultation for esophageal dysmotility  Consultants:

## 2023-04-26 NOTE — Progress Notes (Signed)
Mobility Specialist - Progress Note   04/26/23 1032  Mobility  Activity Ambulated with assistance in hallway  Level of Assistance Standby assist, set-up cues, supervision of patient - no hands on  Assistive Device Front wheel walker  Distance Ambulated (ft) 275 ft  Activity Response Tolerated well  Mobility Referral Yes  $Mobility charge 1 Mobility  Mobility Specialist Start Time (ACUTE ONLY) 1019  Mobility Specialist Stop Time (ACUTE ONLY) 1028  Mobility Specialist Time Calculation (min) (ACUTE ONLY) 9 min   Pt received in bed and agreeable to mobility. No complaints during session. Pt to bed after session with all needs met.     [facility]

## 2023-04-26 NOTE — Assessment & Plan Note (Signed)
04-26-2023 monitor for now. 04-27-2023 WBC has improved. HgB stable at 7.8.  plt count stable at 44K. Will type and screen in case she needs PRBC. In the past(October 2024), she needed irradiated PRBC transfusion. 04-28-2023 Wbc, HgB, plt counts are improving.

## 2023-04-26 NOTE — Care Management Obs Status (Signed)
MEDICARE OBSERVATION STATUS NOTIFICATION   Patient Details  Name: [name] [name] MRN: [id number] Date of Birth: 04-Nov-1945   Medicare Observation Status Notification Given:  Yes    Beckie Busing, RN 04/26/2023, 1:20 PM

## 2023-04-26 NOTE — Assessment & Plan Note (Signed)
04-26-2023 follows with heme/onc. Had chemo last month.

## 2023-04-27 DIAGNOSIS — D61810 Antineoplastic chemotherapy induced pancytopenia: Secondary | ICD-10-CM

## 2023-04-27 DIAGNOSIS — E039 Hypothyroidism, unspecified: Secondary | ICD-10-CM

## 2023-04-27 DIAGNOSIS — E876 Hypokalemia: Secondary | ICD-10-CM | POA: Diagnosis not present

## 2023-04-27 DIAGNOSIS — I10 Essential (primary) hypertension: Secondary | ICD-10-CM | POA: Diagnosis not present

## 2023-04-27 DIAGNOSIS — D709 Neutropenia, unspecified: Secondary | ICD-10-CM | POA: Diagnosis not present

## 2023-04-27 LAB — COMPREHENSIVE METABOLIC PANEL
ALT: 9 U/L (ref 0–44)
AST: 12 U/L — ABNORMAL LOW (ref 15–41)
Albumin: 3.2 g/dL — ABNORMAL LOW (ref 3.5–5.0)
Alkaline Phosphatase: 35 U/L — ABNORMAL LOW (ref 38–126)
Anion gap: 7 (ref 5–15)
BUN: 8 mg/dL (ref 8–23)
CO2: 23 mmol/L (ref 22–32)
Calcium: 8.3 mg/dL — ABNORMAL LOW (ref 8.9–10.3)
Chloride: 104 mmol/L (ref 98–111)
Creatinine, Ser: 0.36 mg/dL — ABNORMAL LOW (ref 0.44–1.00)
GFR, Estimated: >60 mL/min (ref >60)
Glucose, Bld: 100 mg/dL — ABNORMAL HIGH (ref 70–99)
Potassium: 3.4 mmol/L — ABNORMAL LOW (ref 3.5–5.1)
Sodium: 134 mmol/L — ABNORMAL LOW (ref 135–145)
Total Bilirubin: 0.5 mg/dL (ref <1.2)
Total Protein: 6.5 g/dL (ref 6.5–8.1)

## 2023-04-27 LAB — CBC WITH DIFFERENTIAL/PLATELET
Abs Immature Granulocytes: 0.02 10*3/uL (ref 0.00–0.07)
Basophils Absolute: 0 10*3/uL (ref 0.0–0.1)
Basophils Relative: 0 %
Eosinophils Absolute: 0 10*3/uL (ref 0.0–0.5)
Eosinophils Relative: 0 %
HCT: 24.7 % — ABNORMAL LOW (ref 36.0–46.0)
Hemoglobin: 7.8 g/dL — ABNORMAL LOW (ref 12.0–15.0)
Immature Granulocytes: 2 %
Lymphocytes Relative: 93 %
Lymphs Abs: 1.2 10*3/uL (ref 0.7–4.0)
MCH: 31.3 pg (ref 26.0–34.0)
MCHC: 31.6 g/dL (ref 30.0–36.0)
MCV: 99.2 fL (ref 80.0–100.0)
Monocytes Absolute: 0 10*3/uL — ABNORMAL LOW (ref 0.1–1.0)
Monocytes Relative: 3 %
Neutro Abs: 0 10*3/uL — CL (ref 1.7–7.7)
Neutrophils Relative %: 2 %
Platelets: 44 10*3/uL — ABNORMAL LOW (ref 150–400)
RBC: 2.49 MIL/uL — ABNORMAL LOW (ref 3.87–5.11)
RDW: 22.9 % — ABNORMAL HIGH (ref 11.5–15.5)
WBC: 1.3 10*3/uL — CL (ref 4.0–10.5)
nRBC: 0 % (ref 0.0–0.2)

## 2023-04-27 LAB — MAGNESIUM: Magnesium: 2.2 mg/dL (ref 1.7–2.4)

## 2023-04-27 MED ORDER — POTASSIUM CHLORIDE CRYS ER 20 MEQ PO TBCR
40.0000 meq | EXTENDED_RELEASE_TABLET | ORAL | Status: AC
  Administered 2023-04-27 (×2): 40 meq via ORAL
  Filled 2023-04-27 (×2): qty 2

## 2023-04-27 MED ORDER — DOCUSATE SODIUM 100 MG PO CAPS
200.0000 mg | ORAL_CAPSULE | Freq: Every day | ORAL | Status: DC
  Administered 2023-04-27 – 2023-04-29 (×3): 200 mg via ORAL
  Filled 2023-04-27 (×3): qty 2

## 2023-04-27 MED ORDER — DIPHENHYDRAMINE-ZINC ACETATE 2-0.1 % EX CREA
TOPICAL_CREAM | Freq: Three times a day (TID) | CUTANEOUS | Status: DC | PRN
  Filled 2023-04-27: qty 28

## 2023-04-27 NOTE — Plan of Care (Signed)
  Problem: Education: Goal: Knowledge of General Education information will improve Description: Including pain rating scale, medication(s)/side effects and non-pharmacologic comfort measures Outcome: Progressing   Problem: Health Behavior/Discharge Planning: Goal: Ability to manage health-related needs will improve Outcome: Progressing   Problem: Clinical Measurements: Goal: Respiratory complications will improve Outcome: Progressing Goal: Cardiovascular complication will be avoided Outcome: Progressing   Problem: Activity: Goal: Risk for activity intolerance will decrease Outcome: Progressing   Problem: Nutrition: Goal: Adequate nutrition will be maintained Outcome: Progressing   Problem: Elimination: Goal: Will not experience complications related to bowel motility Outcome: Progressing   Problem: Pain Management: Goal: General experience of comfort will improve Outcome: Progressing   Problem: Safety: Goal: Ability to remain free from injury will improve Outcome: Progressing   Problem: Skin Integrity: Goal: Risk for impaired skin integrity will decrease Outcome: Progressing

## 2023-04-27 NOTE — Subjective & Objective (Signed)
Pt seen and examined. Remains afebrile. WBC up to 1.5 from 1.3 yesterday. Blood cx remain negative Remains on IV cefepime and Vanco  itching to her back improved with benadryl cream.  Pt states she is having difficulty swallowing pills. No N/V Having constipation. Laxatives ordered.

## 2023-04-27 NOTE — Progress Notes (Signed)
Mobility Specialist - Progress Note   04/27/23 1412  Mobility  Activity Ambulated with assistance in hallway;Ambulated with assistance to bathroom  Level of Assistance Standby assist, set-up cues, supervision of patient - no hands on  Assistive Device Front wheel walker  Distance Ambulated (ft) 460 ft  Activity Response Tolerated well  Mobility Referral Yes  $Mobility charge 1 Mobility  Mobility Specialist Start Time (ACUTE ONLY) 0135  Mobility Specialist Stop Time (ACUTE ONLY) 0145  Mobility Specialist Time Calculation (min) (ACUTE ONLY) 10 min   Pt received in bed and agreeable to mobility. No complaints during session. Upon returning to room, pt requested assistance to the bathroom. Pt to bed after session with all needs met.    [facility]

## 2023-04-27 NOTE — Progress Notes (Signed)
PROGRESS NOTE    [name] [name]  ZOX:[id number] DOB: 1945-08-06 DOA: 04/25/2023 PCP: Gaspar Garbe, MD  Subjective: Pt seen and examined. Remains afebrile. WBC up to 1.3 from 0.9 yesterday. Blood cx remain negative Remains on IV cefepime and Vanco  C/o itching to her back. Wants some benadryl cream.   Hospital Course: HPI: [name] [name] is a 77 y.o. female with medical history significant for hypothyroidism, psoriatic arthritis, myelodysplasia currently on chemotherapy decitabine under the care of Dr. Al Pimple being admitted to the hospital with neutropenic fever.  Patient has been in her usual state of health until 2 or 3 mornings ago, when she started to feel more lethargic than usual.  She also woke up that morning in a sweat.  Felt tired the rest of the day, and the following day she slept until noon which is very abnormal for her.  She continued to feel hot in her face, and have subjective fevers.  Did not check her temperature.  For the last several days, she has also been having some dysuria.  Otherwise, denies cough, shortness of breath, nausea, vomiting, sick contacts, rashes on the skin, etc.   Went to the oncology clinic today with complaints of fatigue and dysuria.  Temperature in the oncology clinic was 100.0.  Lab work shows neutropenia.  Urinalysis unremarkable.  She was given Tylenol as well as empiric IV cefepime in clinic, after peripheral blood cultures were drawn.  Significant Events: Admitted 04/25/2023 for neutropenic fever   Significant Labs: Admitting WBC 1, ANC 20, HgB 8.5, plt 53   Significant Imaging Studies:   Antibiotic Therapy: Anti-infectives (From admission, onward)    Start     Dose/Rate Route Frequency Ordered Stop   04/26/23 1800  vancomycin (VANCOREADY) IVPB 1500 mg/300 mL        1,500 mg 150 mL/hr over 120 Minutes Intravenous Every 24 hours 04/25/23 1736     04/25/23 2300  ceFEPIme (MAXIPIME) 2 g in sodium chloride 0.9 % 100 mL  IVPB        2 g 200 mL/hr over 30 Minutes Intravenous Every 8 hours 04/25/23 1658     04/25/23 1745  vancomycin (VANCOREADY) IVPB 1500 mg/300 mL        1,500 mg 150 mL/hr over 120 Minutes Intravenous  Once 04/25/23 1657 04/25/23 2036       Procedures:   Consultants:     Assessment and Plan: * Neutropenic fever (HCC) 04-26-2023 admitted on 04-25-2023 due to neutropenic fever. Discussed with heme/onc. No indications for G-CSF at this point. Pt has port-a-cath. Continue with cefepime/vanco for now. 04-27-2023 continue with vanco and cefepime. Awaiting blood cx for 3 days. Possible DC to home on Monday if blood cx remain negative.  Hypokalemia 04-26-2023 replete with oral kcl. Repeat BMP, Mg in AM. 04-27-2023 give more po kcl.  Pancytopenia due to chemotherapy [facility]) 04-26-2023 monitor for now. 04-27-2023 WBC has improved. HgB stable at 7.8.  plt count stable at 44K. Will type and screen in case she needs PRBC. In the past(October 2024), she needed irradiated PRBC transfusion.  MDS (myelodysplastic syndrome), high grade (HCC) 04-26-2023 follows with heme/onc. Had chemo last month.  Psoriasis 04-26-2023 stable  Hypothyroidism 04-26-2023 stable.  HTN (hypertension) 04-26-2023 stable. 04-27-2023 stable.   DVT prophylaxis: SCDs Start: 04/25/23 1712    Code Status: Full Code Family Communication: no family at bedside. Pt is decisional Disposition Plan: return home Reason for continuing need for hospitalization: remains on IV abx(cefepime/vanco). Awaiting blood cx  to be negative for full 3 days.  Objective: Vitals:   04/26/23 0412 04/26/23 1311 04/26/23 2154 04/27/23 0514  BP: 139/61 (!) 102/58 117/62 (!) 115/58  Pulse: 79 75 68 70  Resp: 17 18 16 16   Temp: 99.4 F (37.4 C) 98.7 F (37.1 C) 98.7 F (37.1 C) 98.5 F (36.9 C)  TempSrc: Oral Oral Oral Oral  SpO2: 97% 94% 97% 100%  Weight:      Height:        Intake/Output Summary (Last 24 hours) at 04/27/2023 1340 Last data  filed at 04/27/2023 9562 Gross per 24 hour  Intake 300 ml  Output 4 ml  Net 296 ml   Filed Weights   04/25/23 1629  Weight: 83.3 kg    Examination:  Physical Exam Vitals and nursing note reviewed.  Constitutional:      General: She is not in acute distress.    Appearance: She is not toxic-appearing or diaphoretic.  HENT:     Head: Normocephalic and atraumatic.     Nose: Nose normal.  Eyes:     General: No scleral icterus. Cardiovascular:     Rate and Rhythm: Normal rate and regular rhythm.  Pulmonary:     Effort: Pulmonary effort is normal.     Breath sounds: Normal breath sounds.  Abdominal:     General: Bowel sounds are normal. There is no distension.     Palpations: Abdomen is soft.     Tenderness: There is no abdominal tenderness.  Musculoskeletal:     Right lower leg: No edema.     Left lower leg: No edema.  Skin:    General: Skin is warm and dry.     Capillary Refill: Capillary refill takes less than 2 seconds.     Comments: Right ant chest wall port-a-cath. No erythema.  Neurological:     General: No focal deficit present.     Mental Status: She is alert and oriented to person, place, and time.     Data Reviewed: I have personally reviewed following labs and imaging studies  CBC: Recent Labs  Lab 04/22/23 1020 04/25/23 1049 04/26/23 0500 04/27/23 0622  WBC 1.0* 1.0* 0.9* 1.3*  NEUTROABS 0.0* 0.0*  --  0.0*  HGB 8.6* 8.5* 7.4* 7.8*  HCT 27.0* 26.5* 23.1* 24.7*  MCV 95.1 95.7 97.9 99.2  PLT 58* 53* 40* 44*   Basic Metabolic Panel: Recent Labs  Lab 04/25/23 1049 04/26/23 0500 04/27/23 0622  NA 137 135 134*  K 3.4* 3.1* 3.4*  CL 106 105 104  CO2 26 23 23   GLUCOSE 100* 97 100*  BUN 10 8 8   CREATININE 0.45 0.39* 0.36*  CALCIUM 8.8* 8.1* 8.3*  MG 2.0  --  2.2   GFR: Estimated Creatinine Clearance: 68.8 mL/min (A) (by C-G formula based on SCr of 0.36 mg/dL (L)). Liver Function Tests: Recent Labs  Lab 04/25/23 1049 04/27/23 0622  AST  12* 12*  ALT 6 9  ALKPHOS 45 35*  BILITOT 0.6 0.5  PROT 7.0 6.5  ALBUMIN 3.7 3.2*    Recent Results (from the past 240 hour(s))  Urine Culture     Status: None   Collection Time: 04/25/23 12:07 PM   Specimen: Urine, Clean Catch  Result Value Ref Range Status   Specimen Description   Final    URINE, CLEAN CATCH Performed at [facility], [address] [place], [place] [address]    Special Requests   Final  NONE Performed at [facility], [address], [place], [place] [address]    Culture   Final    NO GROWTH Performed at [facility], [address] [place], [place] [address]    Report Status 04/26/2023 FINAL  Final  Resp panel by RT-PCR (RSV, Flu A&B, Covid) Anterior Nasal Swab     Status: None   Collection Time: 04/25/23  1:00 PM   Specimen: Anterior Nasal Swab  Result Value Ref Range Status   SARS Coronavirus 2 by RT PCR NEGATIVE NEGATIVE Final    Comment: (NOTE) SARS-CoV-2 target nucleic acids are NOT DETECTED.  The SARS-CoV-2 RNA is generally detectable in upper respiratory specimens during the acute phase of infection. The lowest concentration of SARS-CoV-2 viral copies this assay can detect is 138 copies/mL. A negative result does not preclude SARS-Cov-2 infection and should not be used as the sole basis for treatment or other patient management decisions. A negative result may occur with  improper specimen collection/handling, submission of specimen other than nasopharyngeal swab, presence of viral mutation(s) within the areas targeted by this assay, and inadequate number of viral copies(<138 copies/mL). A negative result must be combined with clinical observations, patient history, and epidemiological information. The expected result is Negative.  Fact Sheet for Patients:  BloggerCourse.com  Fact Sheet for Healthcare Providers:   SeriousBroker.it  This test is no t yet approved or cleared by the [place] FDA and  has been authorized for detection and/or diagnosis of SARS-CoV-2 by FDA under an Emergency Use Authorization (EUA). This EUA will remain  in effect (meaning this test can be used) for the duration of the COVID-19 declaration under Section 564(b)(1) of the Act, 21 U.S.C.section 360bbb-3(b)(1), unless the authorization is terminated  or revoked sooner.       Influenza A by PCR NEGATIVE NEGATIVE Final   Influenza B by PCR NEGATIVE NEGATIVE Final    Comment: (NOTE) The Xpert Xpress SARS-CoV-2/FLU/RSV plus assay is intended as an aid in the diagnosis of influenza from Nasopharyngeal swab specimens and should not be used as a sole basis for treatment. Nasal washings and aspirates are unacceptable for Xpert Xpress SARS-CoV-2/FLU/RSV testing.  Fact Sheet for Patients: BloggerCourse.com  Fact Sheet for Healthcare Providers: SeriousBroker.it  This test is not yet approved or cleared by the [place] FDA and has been authorized for detection and/or diagnosis of SARS-CoV-2 by FDA under an Emergency Use Authorization (EUA). This EUA will remain in effect (meaning this test can be used) for the duration of the COVID-19 declaration under Section 564(b)(1) of the Act, 21 U.S.C. section 360bbb-3(b)(1), unless the authorization is terminated or revoked.     Resp Syncytial Virus by PCR NEGATIVE NEGATIVE Final    Comment: (NOTE) Fact Sheet for Patients: BloggerCourse.com  Fact Sheet for Healthcare Providers: SeriousBroker.it  This test is not yet approved or cleared by the [place] FDA and has been authorized for detection and/or diagnosis of SARS-CoV-2 by FDA under an Emergency Use Authorization (EUA). This EUA will remain in effect (meaning this test can be used) for  the duration of the COVID-19 declaration under Section 564(b)(1) of the Act, 21 U.S.C. section 360bbb-3(b)(1), unless the authorization is terminated or revoked.  Performed at [facility], [address] [place], [place] [address]   Culture, blood (Routine X 2) w Reflex to ID Panel     Status: None (Preliminary result)   Collection Time: 04/25/23  1:44 PM   Specimen:  BLOOD  Result Value Ref Range Status   Specimen Description   Final    BLOOD LEFT ANTECUBITAL Performed at [facility], [address] [place], [place] [address]    Special Requests   Final    BOTTLES DRAWN AEROBIC AND ANAEROBIC Blood Culture results may not be optimal due to an excessive volume of blood received in culture bottles   Culture   Final    NO GROWTH 2 DAYS Performed at [facility], [address] [place], [place] [address]    Report Status PENDING  Incomplete  Culture, blood (Routine X 2) w Reflex to ID Panel     Status: None (Preliminary result)   Collection Time: 04/25/23  2:22 PM   Specimen: BLOOD  Result Value Ref Range Status   Specimen Description   Final    BLOOD RIGHT ANTECUBITAL Performed at [facility], [address], [place], [place] [address]    Special Requests   Final    BOTTLES DRAWN AEROBIC AND ANAEROBIC Blood Culture results may not be optimal due to an excessive volume of blood received in culture bottles   Culture   Final    NO GROWTH 2 DAYS Performed at [facility], [address], [place], [place] [address]    Report Status PENDING  Incomplete     Radiology Studies: No results found.  Scheduled Meds:  Chlorhexidine Gluconate Cloth  6 each Topical Daily   docusate sodium  200 mg Oral Daily   gabapentin  800 mg Oral BID   levothyroxine  100 mcg Oral Q0600   sodium chloride flush  3 mL Intravenous Q12H   Continuous Infusions:  ceFEPime (MAXIPIME) IV 2 g (04/27/23 6578)   vancomycin  1,500 mg (04/26/23 1754)     LOS: 1 day   Time spent: 35 minutes  Carollee Herter, DO  [facility]  04/27/2023, 1:40 PM

## 2023-04-28 DIAGNOSIS — R1310 Dysphagia, unspecified: Secondary | ICD-10-CM

## 2023-04-28 DIAGNOSIS — D709 Neutropenia, unspecified: Secondary | ICD-10-CM | POA: Diagnosis not present

## 2023-04-28 DIAGNOSIS — E876 Hypokalemia: Secondary | ICD-10-CM | POA: Diagnosis not present

## 2023-04-28 DIAGNOSIS — D61810 Antineoplastic chemotherapy induced pancytopenia: Secondary | ICD-10-CM | POA: Diagnosis not present

## 2023-04-28 LAB — CBC WITH DIFFERENTIAL/PLATELET
Abs Immature Granulocytes: 0 10*3/uL (ref 0.00–0.07)
Basophils Absolute: 0 10*3/uL (ref 0.0–0.1)
Basophils Relative: 0 %
Eosinophils Absolute: 0 10*3/uL (ref 0.0–0.5)
Eosinophils Relative: 0 %
HCT: 26.2 % — ABNORMAL LOW (ref 36.0–46.0)
Hemoglobin: 8.1 g/dL — ABNORMAL LOW (ref 12.0–15.0)
Immature Granulocytes: 0 %
Lymphocytes Relative: 90 %
Lymphs Abs: 1.3 10*3/uL (ref 0.7–4.0)
MCH: 31.3 pg (ref 26.0–34.0)
MCHC: 30.9 g/dL (ref 30.0–36.0)
MCV: 101.2 fL — ABNORMAL HIGH (ref 80.0–100.0)
Monocytes Absolute: 0.1 10*3/uL (ref 0.1–1.0)
Monocytes Relative: 5 %
Neutro Abs: 0.1 10*3/uL — CL (ref 1.7–7.7)
Neutrophils Relative %: 5 %
Platelets: 47 10*3/uL — ABNORMAL LOW (ref 150–400)
RBC: 2.59 MIL/uL — ABNORMAL LOW (ref 3.87–5.11)
RDW: 22.6 % — ABNORMAL HIGH (ref 11.5–15.5)
WBC: 1.5 10*3/uL — ABNORMAL LOW (ref 4.0–10.5)
nRBC: 0 % (ref 0.0–0.2)

## 2023-04-28 LAB — TYPE AND SCREEN
ABO/RH(D): O POS
Antibody Screen: NEGATIVE

## 2023-04-28 MED ORDER — POLYETHYLENE GLYCOL 3350 17 G PO PACK
34.0000 g | PACK | Freq: Every day | ORAL | Status: DC
  Administered 2023-04-28 – 2023-04-29 (×2): 34 g via ORAL
  Filled 2023-04-28 (×2): qty 2

## 2023-04-28 NOTE — Progress Notes (Signed)
Pharmacy Antibiotic Note  [name] [name] is a 77 y.o. female admitted on 04/25/2023 with  febrile neutropenia and Pharmacy was consulted for cefepime and vancomycin .    Today, 04/28/23 Remains afebrile, WBC up to 1.5, and blood cultures remain negative.  Discussed with MD who agreed to a 5 day total stop date for IV antibiotics.  Plan: Continue cefepime 2 g IV q8h and  Vancomycin 1500 mg IV q24h for 5 days total per MD order Pharmacy will sign off consult and continue to follow peripherally until completion of cefepime and vancomycin  Height: 5\' 8"  (172.7 cm) Weight: 83.3 kg (183 lb 10.3 oz) IBW/kg (Calculated) : 63.9  Temp (24hrs), Avg:98.5 F (36.9 C), Min:98.3 F (36.8 C), Max:98.6 F (37 C)  Recent Labs  Lab 04/22/23 1020 04/25/23 1049 04/26/23 0500 04/27/23 0622 04/28/23 0631  WBC 1.0* 1.0* 0.9* 1.3* 1.5*  CREATININE  --  0.45 0.39* 0.36*  --     Estimated Creatinine Clearance: 68.8 mL/min (A) (by C-G formula based on SCr of 0.36 mg/dL (L)).    Allergies  Allergen Reactions   Bee Pollen Shortness Of Breath and Other (See Comments)    Wheezing, also   Latex Itching and Rash   Pollen Extract-Tree Extract [Pollen Extract] Shortness Of Breath, Itching and Other (See Comments)    Wheezing, also   Cat Hair Extract Itching and Other (See Comments)    Itchy eyes, runny nose, sneezing   Dust Mite Extract Itching and Other (See Comments)    Itchy eyes, runny nose, sneezing   Molds & Smuts Itching and Other (See Comments)    Itchy eyes, runny nose, sneezing    Antimicrobials this admission: 11/7 vancomycin >> (11/11) 11/7 cefepime >> (11/12)   Microbiology results: 11/7 BCx: NGTD 11/7 UCx: no growth     Thank you for allowing pharmacy to be a part of this patient's care.  Lynden Ang, PharmD, BCPS 04/28/2023 10:22 AM

## 2023-04-28 NOTE — Progress Notes (Signed)
PROGRESS NOTE    [name] [name]  RKY:[id number] DOB: 08/11/1945 DOA: 04/25/2023 PCP: Gaspar Garbe, MD  Subjective: Pt seen and examined. Remains afebrile. WBC up to 1.5 from 1.3 yesterday. Blood cx remain negative Remains on IV cefepime and Vanco  itching to her back improved with benadryl cream.  Pt states she is having difficulty swallowing pills. No N/V Having constipation. Laxatives ordered.   Hospital Course: HPI: [name] [name] is a 77 y.o. female with medical history significant for hypothyroidism, psoriatic arthritis, myelodysplasia currently on chemotherapy decitabine under the care of Dr. Al Pimple being admitted to the hospital with neutropenic fever.  Patient has been in her usual state of health until 2 or 3 mornings ago, when she started to feel more lethargic than usual.  She also woke up that morning in a sweat.  Felt tired the rest of the day, and the following day she slept until noon which is very abnormal for her.  She continued to feel hot in her face, and have subjective fevers.  Did not check her temperature.  For the last several days, she has also been having some dysuria.  Otherwise, denies cough, shortness of breath, nausea, vomiting, sick contacts, rashes on the skin, etc.   Went to the oncology clinic today with complaints of fatigue and dysuria.  Temperature in the oncology clinic was 100.0.  Lab work shows neutropenia.  Urinalysis unremarkable.  She was given Tylenol as well as empiric IV cefepime in clinic, after peripheral blood cultures were drawn.  Significant Events: Admitted 04/25/2023 for neutropenic fever   Significant Labs: Admitting WBC 1, ANC 20, HgB 8.5, plt 53   Significant Imaging Studies:   Antibiotic Therapy: Anti-infectives (From admission, onward)    Start     Dose/Rate Route Frequency Ordered Stop   04/26/23 1800  vancomycin (VANCOREADY) IVPB 1500 mg/300 mL        1,500 mg 150 mL/hr over 120 Minutes Intravenous Every  24 hours 04/25/23 1736     04/25/23 2300  ceFEPIme (MAXIPIME) 2 g in sodium chloride 0.9 % 100 mL IVPB        2 g 200 mL/hr over 30 Minutes Intravenous Every 8 hours 04/25/23 1658     04/25/23 1745  vancomycin (VANCOREADY) IVPB 1500 mg/300 mL        1,500 mg 150 mL/hr over 120 Minutes Intravenous  Once 04/25/23 1657 04/25/23 2036       Procedures:   Consultants:     Assessment and Plan: * Neutropenic fever (HCC) 04-26-2023 admitted on 04-25-2023 due to neutropenic fever. Discussed with heme/onc. No indications for G-CSF at this point. Pt has port-a-cath. Continue with cefepime/vanco for now. 04-27-2023 continue with vanco and cefepime. Awaiting blood cx for 3 days. Possible DC to home on Monday if blood cx remain negative. 04-28-2023. Stable. Blood cx remain negative.  Dysphagia 04-28-2023 Pt states she has been having dysphagia for several days to a few weeks no. Having difficulty swallowing pills. Will consult ST and order MBS. Discussed with pt that even if she did have esophageal stricture, she is probably not a candidate for dilatation due to leukopenia and thrombocyotpenia.  Hypokalemia 04-26-2023 replete with oral kcl. Repeat BMP, Mg in AM. 04-27-2023 give more po kcl.  Pancytopenia due to chemotherapy [facility]) 04-26-2023 monitor for now. 04-27-2023 WBC has improved. HgB stable at 7.8.  plt count stable at 44K. Will type and screen in case she needs PRBC. In the past(October 2024), she needed irradiated PRBC  transfusion. 04-28-2023 Wbc, HgB, plt counts are improving.  MDS (myelodysplastic syndrome), high grade (HCC) 04-26-2023 follows with heme/onc. Had chemo last month.  Psoriasis 04-26-2023 stable  Hypothyroidism 04-26-2023 stable.  HTN (hypertension) 04-26-2023 stable. 04-27-2023 stable. 04-28-2023 stable   DVT prophylaxis: SCDs Start: 04/25/23 1712    Code Status: Full Code Family Communication: no family at bedside. Pt lives alone Disposition Plan: return  home Reason for continuing need for hospitalization: needs to see ST for MBS and swallowing evaluation.  Objective: Vitals:   04/27/23 2027 04/28/23 0534 04/28/23 1144 04/28/23 1314  BP: 114/62 (!) 95/59 104/61 113/65  Pulse: 74 80  70  Resp: 14 14  16   Temp: 98.6 F (37 C) 98.5 F (36.9 C)  98.4 F (36.9 C)  TempSrc: Oral Oral  Oral  SpO2: 98% 95%  98%  Weight:      Height:        Intake/Output Summary (Last 24 hours) at 04/28/2023 1417 Last data filed at 04/28/2023 1322 Gross per 24 hour  Intake 780 ml  Output --  Net 780 ml   Filed Weights   04/25/23 1629  Weight: 83.3 kg    Examination:  Physical Exam Vitals and nursing note reviewed.  Constitutional:      General: She is not in acute distress.    Appearance: She is not toxic-appearing or diaphoretic.  HENT:     Head: Normocephalic and atraumatic.     Nose: Nose normal.  Eyes:     General: No scleral icterus. Pulmonary:     Effort: Pulmonary effort is normal. No respiratory distress.  Abdominal:     General: There is no distension.  Skin:    Comments: Right ant chest wall port-a-cath. No erythema.  Neurological:     General: No focal deficit present.     Mental Status: She is alert and oriented to person, place, and time.     Data Reviewed: I have personally reviewed following labs and imaging studies  CBC: Recent Labs  Lab 04/22/23 1020 04/25/23 1049 04/26/23 0500 04/27/23 0622 04/28/23 0631  WBC 1.0* 1.0* 0.9* 1.3* 1.5*  NEUTROABS 0.0* 0.0*  --  0.0* 0.1*  HGB 8.6* 8.5* 7.4* 7.8* 8.1*  HCT 27.0* 26.5* 23.1* 24.7* 26.2*  MCV 95.1 95.7 97.9 99.2 101.2*  PLT 58* 53* 40* 44* 47*   Basic Metabolic Panel: Recent Labs  Lab 04/25/23 1049 04/26/23 0500 04/27/23 0622  NA 137 135 134*  K 3.4* 3.1* 3.4*  CL 106 105 104  CO2 26 23 23   GLUCOSE 100* 97 100*  BUN 10 8 8   CREATININE 0.45 0.39* 0.36*  CALCIUM 8.8* 8.1* 8.3*  MG 2.0  --  2.2   GFR: Estimated Creatinine Clearance: 68.8 mL/min  (A) (by C-G formula based on SCr of 0.36 mg/dL (L)). Liver Function Tests: Recent Labs  Lab 04/25/23 1049 04/27/23 0622  AST 12* 12*  ALT 6 9  ALKPHOS 45 35*  BILITOT 0.6 0.5  PROT 7.0 6.5  ALBUMIN 3.7 3.2*    Recent Results (from the past 240 hour(s))  Urine Culture     Status: None   Collection Time: 04/25/23 12:07 PM   Specimen: Urine, Clean Catch  Result Value Ref Range Status   Specimen Description   Final    URINE, CLEAN CATCH Performed at [facility], [address] [place], [place] [address]    Special Requests   Final    NONE Performed at [facility], [address], [place], [place] [address]    Culture   Final    NO GROWTH Performed at [facility], [address] [place], [place] [address]    Report Status 04/26/2023 FINAL  Final  Resp panel by RT-PCR (RSV, Flu A&B, Covid) Anterior Nasal Swab     Status: None   Collection Time: 04/25/23  1:00 PM   Specimen: Anterior Nasal Swab  Result Value Ref Range Status   SARS Coronavirus 2 by RT PCR NEGATIVE NEGATIVE Final    Comment: (NOTE) SARS-CoV-2 target nucleic acids are NOT DETECTED.  The SARS-CoV-2 RNA is generally detectable in upper respiratory specimens during the acute phase of infection. The lowest concentration of SARS-CoV-2 viral copies this assay can detect is 138 copies/mL. A negative result does not preclude SARS-Cov-2 infection and should not be used as the sole basis for treatment or other patient management decisions. A negative result may occur with  improper specimen collection/handling, submission of specimen other than nasopharyngeal swab, presence of viral mutation(s) within the areas targeted by this assay, and inadequate number of viral copies(<138 copies/mL). A negative result must be combined with clinical observations, patient history, and epidemiological information. The expected result is Negative.  Fact Sheet for  Patients:  BloggerCourse.com  Fact Sheet for Healthcare Providers:  SeriousBroker.it  This test is no t yet approved or cleared by the [place] FDA and  has been authorized for detection and/or diagnosis of SARS-CoV-2 by FDA under an Emergency Use Authorization (EUA). This EUA will remain  in effect (meaning this test can be used) for the duration of the COVID-19 declaration under Section 564(b)(1) of the Act, 21 U.S.C.section 360bbb-3(b)(1), unless the authorization is terminated  or revoked sooner.       Influenza A by PCR NEGATIVE NEGATIVE Final   Influenza B by PCR NEGATIVE NEGATIVE Final    Comment: (NOTE) The Xpert Xpress SARS-CoV-2/FLU/RSV plus assay is intended as an aid in the diagnosis of influenza from Nasopharyngeal swab specimens and should not be used as a sole basis for treatment. Nasal washings and aspirates are unacceptable for Xpert Xpress SARS-CoV-2/FLU/RSV testing.  Fact Sheet for Patients: BloggerCourse.com  Fact Sheet for Healthcare Providers: SeriousBroker.it  This test is not yet approved or cleared by the [place] FDA and has been authorized for detection and/or diagnosis of SARS-CoV-2 by FDA under an Emergency Use Authorization (EUA). This EUA will remain in effect (meaning this test can be used) for the duration of the COVID-19 declaration under Section 564(b)(1) of the Act, 21 U.S.C. section 360bbb-3(b)(1), unless the authorization is terminated or revoked.     Resp Syncytial Virus by PCR NEGATIVE NEGATIVE Final    Comment: (NOTE) Fact Sheet for Patients: BloggerCourse.com  Fact Sheet for Healthcare Providers: SeriousBroker.it  This test is not yet approved or cleared by the [place] FDA and has been authorized for detection and/or diagnosis of SARS-CoV-2 by FDA under an Emergency  Use Authorization (EUA). This EUA will remain in effect (meaning this test can be used) for the duration of the COVID-19 declaration under Section 564(b)(1) of the Act, 21 U.S.C. section 360bbb-3(b)(1), unless the authorization is terminated or revoked.  Performed at [facility], [address], [place], [place] [address]   Culture, blood (Routine X 2) w Reflex to ID Panel     Status: None (Preliminary result)   Collection Time: 04/25/23  1:44 PM   Specimen: BLOOD  Result Value Ref Range  Status   Specimen Description   Final    BLOOD LEFT ANTECUBITAL Performed at [facility], [address], [place], [place] [address]    Special Requests   Final    BOTTLES DRAWN AEROBIC AND ANAEROBIC Blood Culture results may not be optimal due to an excessive volume of blood received in culture bottles   Culture   Final    NO GROWTH 3 DAYS Performed at [facility], [address] [place], [place] [address]    Report Status PENDING  Incomplete  Culture, blood (Routine X 2) w Reflex to ID Panel     Status: None (Preliminary result)   Collection Time: 04/25/23  2:22 PM   Specimen: BLOOD  Result Value Ref Range Status   Specimen Description   Final    BLOOD RIGHT ANTECUBITAL Performed at [facility], [address] [place], [place] [address]    Special Requests   Final    BOTTLES DRAWN AEROBIC AND ANAEROBIC Blood Culture results may not be optimal due to an excessive volume of blood received in culture bottles   Culture   Final    NO GROWTH 3 DAYS Performed at [facility], [address], [place], [place] [address]    Report Status PENDING  Incomplete     Radiology Studies: No results found.  Scheduled Meds:  Chlorhexidine Gluconate Cloth  6 each Topical Daily   docusate sodium  200 mg Oral Daily   gabapentin  800 mg Oral BID   levothyroxine  100 mcg Oral Q0600   polyethylene glycol  34 g Oral Daily    sodium chloride flush  3 mL Intravenous Q12H   Continuous Infusions:  ceFEPime (MAXIPIME) IV 2 g (04/28/23 0612)   vancomycin 1,500 mg (04/27/23 1820)     LOS: 2 days   Time spent: 35 minutes  Carollee Herter, DO  [facility]  04/28/2023, 2:17 PM

## 2023-04-28 NOTE — Plan of Care (Signed)
°  Problem: Education: Goal: Knowledge of General Education information will improve Description: Including pain rating scale, medication(s)/side effects and non-pharmacologic comfort measures Outcome: Progressing   Problem: Clinical Measurements: Goal: Ability to maintain clinical measurements within normal limits will improve Outcome: Progressing Goal: Will remain free from infection Outcome: Progressing Goal: Diagnostic test results will improve Outcome: Progressing Goal: Respiratory complications will improve Outcome: Progressing Goal: Cardiovascular complication will be avoided Outcome: Progressing   Problem: Activity: Goal: Risk for activity intolerance will decrease Outcome: Progressing   Problem: Nutrition: Goal: Adequate nutrition will be maintained Outcome: Progressing   Problem: Coping: Goal: Level of anxiety will decrease Outcome: Progressing   Problem: Safety: Goal: Ability to remain free from injury will improve Outcome: Progressing   Problem: Skin Integrity: Goal: Risk for impaired skin integrity will decrease Outcome: Progressing

## 2023-04-28 NOTE — Assessment & Plan Note (Signed)
04-28-2023 Pt states she has been having dysphagia for several days to a few weeks no. Having difficulty swallowing pills. Will consult ST and order MBS. Discussed with pt that even if she did have esophageal stricture, she is probably not a candidate for dilatation due to leukopenia and thrombocyotpenia. 04-29-2023 for MBS today. Pt still understands that if stricture is found, given her pancytopenia and immunocompromise state, I cannot recommend she undergo any sort of invasive procedure such as esophageal dilatation. Hopefully, ST will find food consistency that pt can tolerate.  Pt passed her MBS. No issues with oral or oropharyngeal dysphagia. Outpatient GI referral will be made for esophageal dysmotility. Pt to stay on regular diet with thin liquids.

## 2023-04-28 NOTE — Progress Notes (Signed)
Mobility Specialist - Progress Note   04/28/23 1427  Mobility  Activity Ambulated with assistance in hallway  Level of Assistance Modified independent, requires aide device or extra time  Assistive Device Front wheel walker  Distance Ambulated (ft) 500 ft  Activity Response Tolerated well  Mobility Referral Yes  $Mobility charge 1 Mobility  Mobility Specialist Start Time (ACUTE ONLY) 0216  Mobility Specialist Stop Time (ACUTE ONLY) 0224  Mobility Specialist Time Calculation (min) (ACUTE ONLY) 8 min   Pt received in bed and agreeable to mobility. No complaints during session. Pt to bed after session with all needs met.    [facility]

## 2023-04-29 ENCOUNTER — Inpatient Hospital Stay (HOSPITAL_COMMUNITY): Payer: Medicare Other

## 2023-04-29 ENCOUNTER — Inpatient Hospital Stay: Payer: Medicare Other

## 2023-04-29 ENCOUNTER — Encounter: Payer: Self-pay | Admitting: Physician Assistant

## 2023-04-29 DIAGNOSIS — R1310 Dysphagia, unspecified: Secondary | ICD-10-CM | POA: Diagnosis not present

## 2023-04-29 DIAGNOSIS — D709 Neutropenia, unspecified: Secondary | ICD-10-CM | POA: Diagnosis not present

## 2023-04-29 DIAGNOSIS — D61810 Antineoplastic chemotherapy induced pancytopenia: Secondary | ICD-10-CM | POA: Diagnosis not present

## 2023-04-29 DIAGNOSIS — E876 Hypokalemia: Secondary | ICD-10-CM | POA: Diagnosis not present

## 2023-04-29 MED ORDER — OXYCODONE HCL 5 MG PO TABS: 2.5000 mg | ORAL_TABLET | ORAL | 0 refills | Status: AC | PRN

## 2023-04-29 MED ORDER — HEPARIN SOD (PORK) LOCK FLUSH 100 UNIT/ML IV SOLN
500.0000 [IU] | Freq: Once | INTRAVENOUS | Status: AC
  Administered 2023-04-29: 500 [IU] via INTRAVENOUS
  Filled 2023-04-29: qty 5

## 2023-04-29 MED ORDER — NA SULFATE-K SULFATE-MG SULF 17.5-3.13-1.6 GM/177ML PO SOLN: ORAL | 0 refills | Status: DC

## 2023-04-29 MED ORDER — BISACODYL 5 MG PO TBEC: 10.0000 mg | DELAYED_RELEASE_TABLET | Freq: Every day | ORAL | Status: DC

## 2023-04-29 NOTE — Discharge Summary (Signed)
[facility] Phys[facility] Discharge Summary   Patient name: [name] [name]  Admit date:     04/25/2023  Discharge date: 04/29/2023  Attending Physician: Imogene Burn, Faige Seely [[id number]]  Discharge Physician: Carollee Herter   PCP: Gaspar Garbe, MD  Admitted From: Home Disposition:  Home  Recommendations for Outpatient Follow-up:  Follow up with PCP in 1-2 weeks Follow up with oncology as scheduled Outpatient GI referral will be made for esophageal dysmotility  Home Health:No Equipment/Devices: None    Discharge Condition:Stable CODE STATUS:FULL Diet recommendation: Heart Healthy Fluid Restriction: None  Hospital Summary: HPI: STEPHANNY HOCTOR is a 77 y.o. female with medical history significant for hypothyroidism, psoriatic arthritis, myelodysplasia currently on chemotherapy decitabine under the care of Dr. Al Pimple being admitted to the hospital with neutropenic fever.  Patient has been in her usual state of health until 2 or 3 mornings ago, when she started to feel more lethargic than usual.  She also woke up that morning in a sweat.  Felt tired the rest of the day, and the following day she slept until noon which is very abnormal for her.  She continued to feel hot in her face, and have subjective fevers.  Did not check her temperature.  For the last several days, she has also been having some dysuria.  Otherwise, denies cough, shortness of breath, nausea, vomiting, sick contacts, rashes on the skin, etc.   Went to the oncology clinic today with complaints of fatigue and dysuria.  Temperature in the oncology clinic was 100.0.  Lab work shows neutropenia.  Urinalysis unremarkable.  She was given Tylenol as well as empiric IV cefepime in clinic, after peripheral blood cultures were drawn.  Significant Events: Admitted 04/25/2023 for neutropenic fever   Significant Labs: Admitting WBC 1, ANC 20, HgB 8.5, plt 53   Significant Imaging Studies:   Antibiotic Therapy: Anti-infectives  (From admission, onward)    Start     Dose/Rate Route Frequency Ordered Stop   04/26/23 1800  vancomycin (VANCOREADY) IVPB 1500 mg/300 mL        1,500 mg 150 mL/hr over 120 Minutes Intravenous Every 24 hours 04/25/23 1736     04/25/23 2300  ceFEPIme (MAXIPIME) 2 g in sodium chloride 0.9 % 100 mL IVPB        2 g 200 mL/hr over 30 Minutes Intravenous Every 8 hours 04/25/23 1658     04/25/23 1745  vancomycin (VANCOREADY) IVPB 1500 mg/300 mL        1,500 mg 150 mL/hr over 120 Minutes Intravenous  Once 04/25/23 1657 04/25/23 2036       Procedures: 04-29-2023 MBS pt passed her MBS without difficulty. ST recommended OP GI consultation for esophageal dysmotility  Consultants:    Hospital Course by Problem: * Neutropenic fever (HCC) 04-26-2023 admitted on 04-25-2023 due to neutropenic fever. Discussed with heme/onc. No indications for G-CSF at this point. Pt has port-a-cath. Continue with cefepime/vanco for now. 04-27-2023 continue with vanco and cefepime. Awaiting blood cx for 3 days. Possible DC to home on Monday if blood cx remain negative. 04-28-2023. Stable. Blood cx remain negative. 04-29-2023 blood cx negative at 4 days. Medically stable for DC. Have communicated with heme/onc and will recommend pt not receive her scheduled chemo tomorrow.  Dysphagia 04-28-2023 Pt states she has been having dysphagia for several days to a few weeks no. Having difficulty swallowing pills. Will consult ST and order MBS. Discussed with pt that even if she did have esophageal stricture, she is probably not  a candidate for dilatation due to leukopenia and thrombocyotpenia. 04-29-2023 for MBS today. Pt still understands that if stricture is found, given her pancytopenia and immunocompromise state, I cannot recommend she undergo any sort of invasive procedure such as esophageal dilatation. Hopefully, ST will find food consistency that pt can tolerate.  Pt passed her MBS. No issues with oral or oropharyngeal  dysphagia. Outpatient GI referral will be made for esophageal dysmotility. Pt to stay on regular diet with thin liquids.  Hypokalemia 04-26-2023 replete with oral kcl. Repeat BMP, Mg in AM. 04-27-2023 give more po kcl.  Pancytopenia due to chemotherapy [facility]) 04-26-2023 monitor for now. 04-27-2023 WBC has improved. HgB stable at 7.8.  plt count stable at 44K. Will type and screen in case she needs PRBC. In the past(October 2024), she needed irradiated PRBC transfusion. 04-28-2023 Wbc, HgB, plt counts are improving.  MDS (myelodysplastic syndrome), high grade (HCC) 04-26-2023 follows with heme/onc. Had chemo last month.  Psoriasis 04-26-2023 stable  Hypothyroidism 04-26-2023 stable.  HTN (hypertension) 04-26-2023 stable. 04-27-2023 stable. 04-28-2023 stable    Discharge Diagnoses:  Principal Problem:   Neutropenic fever (HCC) Active Problems:   Dysphagia   Pancytopenia due to chemotherapy (HCC)   Hypokalemia   HTN (hypertension)   Hypothyroidism   Psoriasis   MDS (myelodysplastic syndrome), high grade [facility])   Discharge Instructions  Discharge Instructions     Call MD for:  extreme fatigue   Complete by: As directed    Call MD for:  persistant dizziness or light-headedness   Complete by: As directed    Call MD for:  persistant nausea and vomiting   Complete by: As directed    Call MD for:  severe uncontrolled pain   Complete by: As directed    Call MD for:  temperature >100.4   Complete by: As directed    Diet - low sodium heart healthy   Complete by: As directed    Discharge instructions   Complete by: As directed    1. Follow up with PCP in 1-2 weeks following discharge from hospital 2. Follow up with oncology as scheduled. 3. Outpatient GI appointment will be called to you for further workup of your swallowing difficulty   Increase activity slowly   Complete by: As directed       Allergies as of 04/29/2023       Reactions   Bee Pollen Shortness Of Breath,  Other (See Comments)   Wheezing, also   Latex Itching, Rash   Pollen Extract-tree Extract [pollen Extract] Shortness Of Breath, Itching, Other (See Comments)   Wheezing, also   Cat Hair Extract Itching, Other (See Comments)   Itchy eyes, runny nose, sneezing   Dust Mite Extract Itching, Other (See Comments)   Itchy eyes, runny nose, sneezing   Molds & Smuts Itching, Other (See Comments)   Itchy eyes, runny nose, sneezing        Medication List     TAKE these medications    albuterol 108 (90 Base) MCG/ACT inhaler Commonly known as: VENTOLIN HFA Inhale 2 puffs into the lungs every 6 (six) hours as needed for wheezing or shortness of breath.   Azelastine-Fluticasone 137-50 MCG/ACT Susp Place 1 spray into the nose 2 (two) times daily as needed. What changed:  how to take this reasons to take this   b complex vitamins tablet Take 1 tablet by mouth daily.   budesonide-formoterol 80-4.5 MCG/ACT inhaler Commonly known as: Symbicort Inhale 2 puffs into the lungs in the morning  and at bedtime.   ciprofloxacin 250 MG tablet Commonly known as: Cipro Take 1 tablet (250 mg total) by mouth 2 (two) times daily.   docusate sodium 100 MG capsule Commonly known as: COLACE Take 200 mg by mouth at bedtime.   EPINEPHrine 0.3 mg/0.3 mL Soaj injection Commonly known as: EPI-PEN Inject 0.3 mg into the muscle as needed for anaphylaxis.   famotidine 20 MG tablet Commonly known as: PEPCID Take 20 mg by mouth 2 (two) times daily as needed for heartburn or indigestion.   famotidine 40 MG tablet Commonly known as: PEPCID Take 1 tablet (40 mg total) by mouth daily.   Flovent HFA 110 MCG/ACT inhaler Generic drug: fluticasone Inhale 2 puffs into the lungs 2 (two) times daily as needed (for respiratory flares).   gabapentin 800 MG tablet Commonly known as: NEURONTIN Take 800 mg by mouth in the morning and at bedtime.   hydrOXYzine 25 MG capsule Commonly known as: Vistaril Take 1  capsule (25 mg total) by mouth 2 (two) times daily as needed. What changed: reasons to take this   ketoconazole 2 % cream Commonly known as: NIZORAL Apply to bottoms of feet for 2-3 weeks   levothyroxine 100 MCG tablet Commonly known as: SYNTHROID Take 100 mcg by mouth daily before breakfast.   lidocaine-prilocaine cream Commonly known as: EMLA Apply 1 Application topically as needed (for port access).   Magnesium 250 MG Tabs Take 250 mg by mouth daily.   montelukast 10 MG tablet Commonly known as: SINGULAIR TAKE 1 TABLET(10 MG) BY MOUTH AT BEDTIME What changed: See the new instructions.   ondansetron 8 MG tablet Commonly known as: ZOFRAN Take 8 mg by mouth every 8 (eight) hours as needed for nausea or vomiting.   oxyCODONE 5 MG immediate release tablet Commonly known as: Oxy IR/ROXICODONE Take 0.5-1 tablets (2.5-5 mg total) by mouth every 4 (four) hours as needed for up to 5 days for moderate pain (pain score 4-6).   pantoprazole 40 MG tablet Commonly known as: PROTONIX TAKE 1 TABLET(40 MG) BY MOUTH TWICE DAILY BEFORE A MEAL   predniSONE 10 MG tablet Commonly known as: DELTASONE Take 1 tablet (10 mg total) by mouth daily with breakfast. What changed:  when to take this reasons to take this   SIMPONI ARIA IV Inject 206.4 mg into the vein See admin instructions. Inject 206.4 mg into the vein every 8 weeks   triamcinolone ointment 0.1 % Commonly known as: KENALOG Apply 1 Application topically 2 (two) times daily. What changed:  when to take this reasons to take this   Tylenol 325 MG tablet Generic drug: acetaminophen Take 325-650 mg by mouth every 6 (six) hours as needed for mild pain (pain score 1-3), headache or fever.   UNKNOWN TO PATIENT Place 1 drop into both eyes See admin instructions. Unnamed/unfound prescription eye drops: Instill 1 drop into both eyes once a day as needed for allergies   Vitamin D3 25 MCG (1000 UT) Caps Take 1,000 Units by mouth  daily.        Allergies  Allergen Reactions   Bee Pollen Shortness Of Breath and Other (See Comments)    Wheezing, also   Latex Itching and Rash   Pollen Extract-Tree Extract [Pollen Extract] Shortness Of Breath, Itching and Other (See Comments)    Wheezing, also   Cat Hair Extract Itching and Other (See Comments)    Itchy eyes, runny nose, sneezing   Dust Mite Extract Itching and Other (See Comments)  Itchy eyes, runny nose, sneezing   Molds & Smuts Itching and Other (See Comments)    Itchy eyes, runny nose, sneezing    Discharge Exam: Vitals:   04/29/23 0548 04/29/23 1424  BP: 110/61 127/62  Pulse: 71 77  Resp: 14 16  Temp: 98.5 F (36.9 C) 99.1 F (37.3 C)  SpO2: 98% 99%    Physical Exam Vitals and nursing note reviewed.  Constitutional:      General: She is not in acute distress.    Appearance: She is not toxic-appearing or diaphoretic.  HENT:     Head: Normocephalic and atraumatic.  Cardiovascular:     Rate and Rhythm: Normal rate and regular rhythm.     Pulses: Normal pulses.     Heart sounds: Murmur heard.  Pulmonary:     Effort: Pulmonary effort is normal.     Breath sounds: Normal breath sounds.  Abdominal:     General: Abdomen is flat. Bowel sounds are normal.     Palpations: Abdomen is soft.  Musculoskeletal:     Right lower leg: No edema.     Left lower leg: No edema.  Skin:    General: Skin is warm and dry.     Capillary Refill: Capillary refill takes less than 2 seconds.  Neurological:     General: No focal deficit present.     Mental Status: She is alert and oriented to person, place, and time.     The results of significant diagnostics from this hospitalization (including imaging, microbiology, ancillary and laboratory) are listed below for reference.    Microbiology: Recent Results (from the past 240 hour(s))  Urine Culture     Status: None   Collection Time: 04/25/23 12:07 PM   Specimen: Urine, Clean Catch  Result Value Ref  Range Status   Specimen Description   Final    URINE, CLEAN CATCH Performed at [facility], [address] [place], [place] [address]    Special Requests   Final    NONE Performed at [facility], [address], [place], [place] [address]    Culture   Final    NO GROWTH Performed at [facility], [address] [place], [place] [address]    Report Status 04/26/2023 FINAL  Final  Resp panel by RT-PCR (RSV, Flu A&B, Covid) Anterior Nasal Swab     Status: None   Collection Time: 04/25/23  1:00 PM   Specimen: Anterior Nasal Swab  Result Value Ref Range Status   SARS Coronavirus 2 by RT PCR NEGATIVE NEGATIVE Final    Comment: (NOTE) SARS-CoV-2 target nucleic acids are NOT DETECTED.  The SARS-CoV-2 RNA is generally detectable in upper respiratory specimens during the acute phase of infection. The lowest concentration of SARS-CoV-2 viral copies this assay can detect is 138 copies/mL. A negative result does not preclude SARS-Cov-2 infection and should not be used as the sole basis for treatment or other patient management decisions. A negative result may occur with  improper specimen collection/handling, submission of specimen other than nasopharyngeal swab, presence of viral mutation(s) within the areas targeted by this assay, and inadequate number of viral copies(<138 copies/mL). A negative result must be combined with clinical observations, patient history, and epidemiological information. The expected result is Negative.  Fact Sheet for Patients:  BloggerCourse.com  Fact Sheet for Healthcare Providers:  SeriousBroker.it  This test is no t yet approved or cleared by the [place] FDA and  has  been authorized for detection and/or diagnosis of SARS-CoV-2 by FDA under an Emergency Use Authorization (EUA). This EUA will remain  in effect (meaning this test can be used)  for the duration of the COVID-19 declaration under Section 564(b)(1) of the Act, 21 U.S.C.section 360bbb-3(b)(1), unless the authorization is terminated  or revoked sooner.       Influenza A by PCR NEGATIVE NEGATIVE Final   Influenza B by PCR NEGATIVE NEGATIVE Final    Comment: (NOTE) The Xpert Xpress SARS-CoV-2/FLU/RSV plus assay is intended as an aid in the diagnosis of influenza from Nasopharyngeal swab specimens and should not be used as a sole basis for treatment. Nasal washings and aspirates are unacceptable for Xpert Xpress SARS-CoV-2/FLU/RSV testing.  Fact Sheet for Patients: BloggerCourse.com  Fact Sheet for Healthcare Providers: SeriousBroker.it  This test is not yet approved or cleared by the [place] FDA and has been authorized for detection and/or diagnosis of SARS-CoV-2 by FDA under an Emergency Use Authorization (EUA). This EUA will remain in effect (meaning this test can be used) for the duration of the COVID-19 declaration under Section 564(b)(1) of the Act, 21 U.S.C. section 360bbb-3(b)(1), unless the authorization is terminated or revoked.     Resp Syncytial Virus by PCR NEGATIVE NEGATIVE Final    Comment: (NOTE) Fact Sheet for Patients: BloggerCourse.com  Fact Sheet for Healthcare Providers: SeriousBroker.it  This test is not yet approved or cleared by the [place] FDA and has been authorized for detection and/or diagnosis of SARS-CoV-2 by FDA under an Emergency Use Authorization (EUA). This EUA will remain in effect (meaning this test can be used) for the duration of the COVID-19 declaration under Section 564(b)(1) of the Act, 21 U.S.C. section 360bbb-3(b)(1), unless the authorization is terminated or revoked.  Performed at [facility], [address], [place], [place] [address]   Culture, blood (Routine X 2) w Reflex to  ID Panel     Status: None (Preliminary result)   Collection Time: 04/25/23  1:44 PM   Specimen: BLOOD  Result Value Ref Range Status   Specimen Description   Final    BLOOD LEFT ANTECUBITAL Performed at [facility], [address], [place], [place] [address]    Special Requests   Final    BOTTLES DRAWN AEROBIC AND ANAEROBIC Blood Culture results may not be optimal due to an excessive volume of blood received in culture bottles   Culture   Final    NO GROWTH 4 DAYS Performed at [facility], [address] [place], [place] [address]    Report Status PENDING  Incomplete  Culture, blood (Routine X 2) w Reflex to ID Panel     Status: None (Preliminary result)   Collection Time: 04/25/23  2:22 PM   Specimen: BLOOD  Result Value Ref Range Status   Specimen Description   Final    BLOOD RIGHT ANTECUBITAL Performed at [facility], [address] [place], [place] [address]    Special Requests   Final    BOTTLES DRAWN AEROBIC AND ANAEROBIC Blood Culture results may not be optimal due to an excessive volume of blood received in culture bottles   Culture   Final    NO GROWTH 4 DAYS Performed at [facility], [address] [place], [place] [address]    Report Status PENDING  Incomplete     Labs:  Basic Metabolic Panel: Recent Labs  Lab 04/25/23 1049 04/26/23 0500 04/27/23  0622  NA 137 135 134*  K 3.4* 3.1* 3.4*  CL 106 105 104  CO2 26 23 23   GLUCOSE 100* 97 100*  BUN 10 8 8   CREATININE 0.45 0.39* 0.36*  CALCIUM 8.8* 8.1* 8.3*  MG 2.0  --  2.2   Liver Function Tests: Recent Labs  Lab 04/25/23 1049 04/27/23 0622  AST 12* 12*  ALT 6 9  ALKPHOS 45 35*  BILITOT 0.6 0.5  PROT 7.0 6.5  ALBUMIN 3.7 3.2*   CBC: Recent Labs  Lab 04/25/23 1049 04/26/23 0500 04/27/23 0622 04/28/23 0631  WBC 1.0* 0.9* 1.3* 1.5*  NEUTROABS 0.0*  --  0.0* 0.1*  HGB 8.5* 7.4* 7.8* 8.1*  HCT 26.5* 23.1* 24.7* 26.2*  MCV 95.7  97.9 99.2 101.2*  PLT 53* 40* 44* 47*   Urinalysis    Component Value Date/Time   COLORURINE AMBER (A) 04/25/2023 1207   APPEARANCEUR CLEAR 04/25/2023 1207   LABSPEC 1.027 04/25/2023 1207   PHURINE 5.0 04/25/2023 1207   GLUCOSEU NEGATIVE 04/25/2023 1207   HGBUR NEGATIVE 04/25/2023 1207   BILIRUBINUR NEGATIVE 04/25/2023 1207   KETONESUR NEGATIVE 04/25/2023 1207   PROTEINUR 30 (A) 04/25/2023 1207   NITRITE NEGATIVE 04/25/2023 1207   LEUKOCYTESUR NEGATIVE 04/25/2023 1207   Sepsis Labs Recent Labs  Lab 04/25/23 1049 04/26/23 0500 04/27/23 0622 04/28/23 0631  WBC 1.0* 0.9* 1.3* 1.5*   Microbiology Recent Results (from the past 240 hour(s))  Urine Culture     Status: None   Collection Time: 04/25/23 12:07 PM   Specimen: Urine, Clean Catch  Result Value Ref Range Status   Specimen Description   Final    URINE, CLEAN CATCH Performed at [facility], [address], [place], [place] [address]    Special Requests   Final    NONE Performed at [facility], [address], [place], [place] [address]    Culture   Final    NO GROWTH Performed at [facility], [address], [place], [place] [address]    Report Status 04/26/2023 FINAL  Final  Resp panel by RT-PCR (RSV, Flu A&B, Covid) Anterior Nasal Swab     Status: None   Collection Time: 04/25/23  1:00 PM   Specimen: Anterior Nasal Swab  Result Value Ref Range Status   SARS Coronavirus 2 by RT PCR NEGATIVE NEGATIVE Final    Comment: (NOTE) SARS-CoV-2 target nucleic acids are NOT DETECTED.  The SARS-CoV-2 RNA is generally detectable in upper respiratory specimens during the acute phase of infection. The lowest concentration of SARS-CoV-2 viral copies this assay can detect is 138 copies/mL. A negative result does not preclude SARS-Cov-2 infection and should not be used as the sole basis for treatment or other patient management decisions. A negative result may occur  with  improper specimen collection/handling, submission of specimen other than nasopharyngeal swab, presence of viral mutation(s) within the areas targeted by this assay, and inadequate number of viral copies(<138 copies/mL). A negative result must be combined with clinical observations, patient history, and epidemiological information. The expected result is Negative.  Fact Sheet for Patients:  BloggerCourse.com  Fact Sheet for Healthcare Providers:  SeriousBroker.it  This test is no t yet approved or cleared by the [place] FDA and  has been authorized for detection and/or diagnosis of SARS-CoV-2 by FDA under an Emergency Use Authorization (EUA). This EUA will remain  in effect (meaning this test can be used) for the duration of  the COVID-19 declaration under Section 564(b)(1) of the Act, 21 U.S.C.section 360bbb-3(b)(1), unless the authorization is terminated  or revoked sooner.       Influenza A by PCR NEGATIVE NEGATIVE Final   Influenza B by PCR NEGATIVE NEGATIVE Final    Comment: (NOTE) The Xpert Xpress SARS-CoV-2/FLU/RSV plus assay is intended as an aid in the diagnosis of influenza from Nasopharyngeal swab specimens and should not be used as a sole basis for treatment. Nasal washings and aspirates are unacceptable for Xpert Xpress SARS-CoV-2/FLU/RSV testing.  Fact Sheet for Patients: BloggerCourse.com  Fact Sheet for Healthcare Providers: SeriousBroker.it  This test is not yet approved or cleared by the [place] FDA and has been authorized for detection and/or diagnosis of SARS-CoV-2 by FDA under an Emergency Use Authorization (EUA). This EUA will remain in effect (meaning this test can be used) for the duration of the COVID-19 declaration under Section 564(b)(1) of the Act, 21 U.S.C. section 360bbb-3(b)(1), unless the authorization is terminated  or revoked.     Resp Syncytial Virus by PCR NEGATIVE NEGATIVE Final    Comment: (NOTE) Fact Sheet for Patients: BloggerCourse.com  Fact Sheet for Healthcare Providers: SeriousBroker.it  This test is not yet approved or cleared by the [place] FDA and has been authorized for detection and/or diagnosis of SARS-CoV-2 by FDA under an Emergency Use Authorization (EUA). This EUA will remain in effect (meaning this test can be used) for the duration of the COVID-19 declaration under Section 564(b)(1) of the Act, 21 U.S.C. section 360bbb-3(b)(1), unless the authorization is terminated or revoked.  Performed at [facility], [address], [place], [place] [address]   Culture, blood (Routine X 2) w Reflex to ID Panel     Status: None (Preliminary result)   Collection Time: 04/25/23  1:44 PM   Specimen: BLOOD  Result Value Ref Range Status   Specimen Description   Final    BLOOD LEFT ANTECUBITAL Performed at [facility], [address] [place], [place] [address]    Special Requests   Final    BOTTLES DRAWN AEROBIC AND ANAEROBIC Blood Culture results may not be optimal due to an excessive volume of blood received in culture bottles   Culture   Final    NO GROWTH 4 DAYS Performed at [facility], [address], [place], [place] [address]    Report Status PENDING  Incomplete  Culture, blood (Routine X 2) w Reflex to ID Panel     Status: None (Preliminary result)   Collection Time: 04/25/23  2:22 PM   Specimen: BLOOD  Result Value Ref Range Status   Specimen Description   Final    BLOOD RIGHT ANTECUBITAL Performed at [facility], [address] [place], [place] [address]    Special Requests   Final    BOTTLES DRAWN AEROBIC AND ANAEROBIC Blood Culture results may not be optimal due to an excessive volume of blood received in culture bottles   Culture    Final    NO GROWTH 4 DAYS Performed at [facility], [address], [place], [place] [address]    Report Status PENDING  Incomplete    Procedures/Studies: No results found.  Time coordinating discharge: 40 mins  SIGNED:  Carollee Herter, DO [facility] 04/29/23, 2:33 PM

## 2023-04-29 NOTE — Plan of Care (Signed)

## 2023-04-29 NOTE — Progress Notes (Signed)
PROGRESS NOTE    [name] [name]  NFA:[id number] DOB: 08/23/1945 DOA: 04/25/2023 PCP: Gaspar Garbe, MD  Subjective: Pt seen and examined. Remains afebrile. ST in pt's room at same time of my interview. ST agrees with MBS today. Remains afebrile.   Hospital Course: HPI: [name] [name] is a 77 y.o. female with medical history significant for hypothyroidism, psoriatic arthritis, myelodysplasia currently on chemotherapy decitabine under the care of Dr. Al Pimple being admitted to the hospital with neutropenic fever.  Patient has been in her usual state of health until 2 or 3 mornings ago, when she started to feel more lethargic than usual.  She also woke up that morning in a sweat.  Felt tired the rest of the day, and the following day she slept until noon which is very abnormal for her.  She continued to feel hot in her face, and have subjective fevers.  Did not check her temperature.  For the last several days, she has also been having some dysuria.  Otherwise, denies cough, shortness of breath, nausea, vomiting, sick contacts, rashes on the skin, etc.   Went to the oncology clinic today with complaints of fatigue and dysuria.  Temperature in the oncology clinic was 100.0.  Lab work shows neutropenia.  Urinalysis unremarkable.  She was given Tylenol as well as empiric IV cefepime in clinic, after peripheral blood cultures were drawn.  Significant Events: Admitted 04/25/2023 for neutropenic fever   Significant Labs: Admitting WBC 1, ANC 20, HgB 8.5, plt 53   Significant Imaging Studies:   Antibiotic Therapy: Anti-infectives (From admission, onward)    Start     Dose/Rate Route Frequency Ordered Stop   04/26/23 1800  vancomycin (VANCOREADY) IVPB 1500 mg/300 mL        1,500 mg 150 mL/hr over 120 Minutes Intravenous Every 24 hours 04/25/23 1736     04/25/23 2300  ceFEPIme (MAXIPIME) 2 g in sodium chloride 0.9 % 100 mL IVPB        2 g 200 mL/hr over 30 Minutes Intravenous  Every 8 hours 04/25/23 1658     04/25/23 1745  vancomycin (VANCOREADY) IVPB 1500 mg/300 mL        1,500 mg 150 mL/hr over 120 Minutes Intravenous  Once 04/25/23 1657 04/25/23 2036       Procedures:   Consultants:     Assessment and Plan: * Neutropenic fever (HCC) 04-26-2023 admitted on 04-25-2023 due to neutropenic fever. Discussed with heme/onc. No indications for G-CSF at this point. Pt has port-a-cath. Continue with cefepime/vanco for now. 04-27-2023 continue with vanco and cefepime. Awaiting blood cx for 3 days. Possible DC to home on Monday if blood cx remain negative. 04-28-2023. Stable. Blood cx remain negative. 04-29-2023 blood cx negative at 4 days. Medically stable for DC. Have communicated with heme/onc and will recommend pt not receive her scheduled chemo tomorrow.  Dysphagia 04-28-2023 Pt states she has been having dysphagia for several days to a few weeks no. Having difficulty swallowing pills. Will consult ST and order MBS. Discussed with pt that even if she did have esophageal stricture, she is probably not a candidate for dilatation due to leukopenia and thrombocyotpenia. 04-29-2023 for MBS today. Pt still understands that if stricture is found, given her pancytopenia and immunocompromise state, I cannot recommend she undergo any sort of invasive procedure such as esophageal dilatation. Hopefully, ST will find food consistency that pt can tolerate.  Hypokalemia 04-26-2023 replete with oral kcl. Repeat BMP, Mg in AM. 04-27-2023 give more  po kcl.  Pancytopenia due to chemotherapy [facility]) 04-26-2023 monitor for now. 04-27-2023 WBC has improved. HgB stable at 7.8.  plt count stable at 44K. Will type and screen in case she needs PRBC. In the past(October 2024), she needed irradiated PRBC transfusion. 04-28-2023 Wbc, HgB, plt counts are improving.  MDS (myelodysplastic syndrome), high grade (HCC) 04-26-2023 follows with heme/onc. Had chemo last month.  Psoriasis 04-26-2023  stable  Hypothyroidism 04-26-2023 stable.  HTN (hypertension) 04-26-2023 stable. 04-27-2023 stable. 04-28-2023 stable   DVT prophylaxis: SCDs Start: 04/25/23 1712    Code Status: Full Code Family Communication: no family at bedside Disposition Plan: return home Reason for continuing need for hospitalization: medically stable for DC. DC at ST eval.  Objective: Vitals:   04/28/23 1144 04/28/23 1314 04/28/23 2130 04/29/23 0548  BP: 104/61 113/65 125/68 110/61  Pulse:  70 76 71  Resp:  16 14 14   Temp:  98.4 F (36.9 C) 98.7 F (37.1 C) 98.5 F (36.9 C)  TempSrc:  Oral Oral Oral  SpO2:  98% 99% 98%  Weight:      Height:        Intake/Output Summary (Last 24 hours) at 04/29/2023 1217 Last data filed at 04/28/2023 1845 Gross per 24 hour  Intake 1280 ml  Output --  Net 1280 ml   Filed Weights   04/25/23 1629  Weight: 83.3 kg    Examination:  Physical Exam Vitals and nursing note reviewed.  Constitutional:      General: She is not in acute distress.    Appearance: She is not toxic-appearing or diaphoretic.  HENT:     Head: Normocephalic and atraumatic.  Cardiovascular:     Rate and Rhythm: Normal rate and regular rhythm.     Pulses: Normal pulses.     Heart sounds: Murmur heard.  Pulmonary:     Effort: Pulmonary effort is normal.     Breath sounds: Normal breath sounds.  Abdominal:     General: Abdomen is flat. Bowel sounds are normal.     Palpations: Abdomen is soft.  Musculoskeletal:     Right lower leg: No edema.     Left lower leg: No edema.  Skin:    General: Skin is warm and dry.     Capillary Refill: Capillary refill takes less than 2 seconds.  Neurological:     General: No focal deficit present.     Mental Status: She is alert and oriented to person, place, and time.     Data Reviewed: I have personally reviewed following labs and imaging studies  CBC: Recent Labs  Lab 04/25/23 1049 04/26/23 0500 04/27/23 0622 04/28/23 0631  WBC 1.0*  0.9* 1.3* 1.5*  NEUTROABS 0.0*  --  0.0* 0.1*  HGB 8.5* 7.4* 7.8* 8.1*  HCT 26.5* 23.1* 24.7* 26.2*  MCV 95.7 97.9 99.2 101.2*  PLT 53* 40* 44* 47*   Basic Metabolic Panel: Recent Labs  Lab 04/25/23 1049 04/26/23 0500 04/27/23 0622  NA 137 135 134*  K 3.4* 3.1* 3.4*  CL 106 105 104  CO2 26 23 23   GLUCOSE 100* 97 100*  BUN 10 8 8   CREATININE 0.45 0.39* 0.36*  CALCIUM 8.8* 8.1* 8.3*  MG 2.0  --  2.2   GFR: Estimated Creatinine Clearance: 68.8 mL/min (A) (by C-G formula based on SCr of 0.36 mg/dL (L)). Liver Function Tests: Recent Labs  Lab 04/25/23 1049 04/27/23 0622  AST 12* 12*  ALT 6 9  ALKPHOS 45 35*  BILITOT 0.6  0.5  PROT 7.0 6.5  ALBUMIN 3.7 3.2*    Recent Results (from the past 240 hour(s))  Urine Culture     Status: None   Collection Time: 04/25/23 12:07 PM   Specimen: Urine, Clean Catch  Result Value Ref Range Status   Specimen Description   Final    URINE, CLEAN CATCH Performed at [facility], [address] [place], [place] [address]    Special Requests   Final    NONE Performed at [facility], [address] [place], [place] [address]    Culture   Final    NO GROWTH Performed at [facility], [address], [place], [place] [address]    Report Status 04/26/2023 FINAL  Final  Resp panel by RT-PCR (RSV, Flu A&B, Covid) Anterior Nasal Swab     Status: None   Collection Time: 04/25/23  1:00 PM   Specimen: Anterior Nasal Swab  Result Value Ref Range Status   SARS Coronavirus 2 by RT PCR NEGATIVE NEGATIVE Final    Comment: (NOTE) SARS-CoV-2 target nucleic acids are NOT DETECTED.  The SARS-CoV-2 RNA is generally detectable in upper respiratory specimens during the acute phase of infection. The lowest concentration of SARS-CoV-2 viral copies this assay can detect is 138 copies/mL. A negative result does not preclude SARS-Cov-2 infection and should not be used as the sole basis for  treatment or other patient management decisions. A negative result may occur with  improper specimen collection/handling, submission of specimen other than nasopharyngeal swab, presence of viral mutation(s) within the areas targeted by this assay, and inadequate number of viral copies(<138 copies/mL). A negative result must be combined with clinical observations, patient history, and epidemiological information. The expected result is Negative.  Fact Sheet for Patients:  BloggerCourse.com  Fact Sheet for Healthcare Providers:  SeriousBroker.it  This test is no t yet approved or cleared by the [place] FDA and  has been authorized for detection and/or diagnosis of SARS-CoV-2 by FDA under an Emergency Use Authorization (EUA). This EUA will remain  in effect (meaning this test can be used) for the duration of the COVID-19 declaration under Section 564(b)(1) of the Act, 21 U.S.C.section 360bbb-3(b)(1), unless the authorization is terminated  or revoked sooner.       Influenza A by PCR NEGATIVE NEGATIVE Final   Influenza B by PCR NEGATIVE NEGATIVE Final    Comment: (NOTE) The Xpert Xpress SARS-CoV-2/FLU/RSV plus assay is intended as an aid in the diagnosis of influenza from Nasopharyngeal swab specimens and should not be used as a sole basis for treatment. Nasal washings and aspirates are unacceptable for Xpert Xpress SARS-CoV-2/FLU/RSV testing.  Fact Sheet for Patients: BloggerCourse.com  Fact Sheet for Healthcare Providers: SeriousBroker.it  This test is not yet approved or cleared by the [place] FDA and has been authorized for detection and/or diagnosis of SARS-CoV-2 by FDA under an Emergency Use Authorization (EUA). This EUA will remain in effect (meaning this test can be used) for the duration of the COVID-19 declaration under Section 564(b)(1) of the Act, 21  U.S.C. section 360bbb-3(b)(1), unless the authorization is terminated or revoked.     Resp Syncytial Virus by PCR NEGATIVE NEGATIVE Final    Comment: (NOTE) Fact Sheet for Patients: BloggerCourse.com  Fact Sheet for Healthcare Providers: SeriousBroker.it  This test is not yet approved or cleared by the [place] FDA and has been authorized for detection and/or diagnosis of SARS-CoV-2 by FDA under an  Emergency Use Authorization (EUA). This EUA will remain in effect (meaning this test can be used) for the duration of the COVID-19 declaration under Section 564(b)(1) of the Act, 21 U.S.C. section 360bbb-3(b)(1), unless the authorization is terminated or revoked.  Performed at [facility], [address] [place], [place] [address]   Culture, blood (Routine X 2) w Reflex to ID Panel     Status: None (Preliminary result)   Collection Time: 04/25/23  1:44 PM   Specimen: BLOOD  Result Value Ref Range Status   Specimen Description   Final    BLOOD LEFT ANTECUBITAL Performed at [facility], [address] [place], [place] [address]    Special Requests   Final    BOTTLES DRAWN AEROBIC AND ANAEROBIC Blood Culture results may not be optimal due to an excessive volume of blood received in culture bottles   Culture   Final    NO GROWTH 4 DAYS Performed at [facility], [address], [place], [place] [address]    Report Status PENDING  Incomplete  Culture, blood (Routine X 2) w Reflex to ID Panel     Status: None (Preliminary result)   Collection Time: 04/25/23  2:22 PM   Specimen: BLOOD  Result Value Ref Range Status   Specimen Description   Final    BLOOD RIGHT ANTECUBITAL Performed at [facility], [address], [place], [place] [address]    Special Requests   Final    BOTTLES DRAWN AEROBIC AND ANAEROBIC Blood Culture results may not be optimal due  to an excessive volume of blood received in culture bottles   Culture   Final    NO GROWTH 4 DAYS Performed at [facility], [address], [place], [place] [address]    Report Status PENDING  Incomplete     Radiology Studies: No results found.  Scheduled Meds:  Chlorhexidine Gluconate Cloth  6 each Topical Daily   docusate sodium  200 mg Oral Daily   gabapentin  800 mg Oral BID   levothyroxine  100 mcg Oral Q0600   polyethylene glycol  34 g Oral Daily   sodium chloride flush  3 mL Intravenous Q12H   Continuous Infusions:  ceFEPime (MAXIPIME) IV 2 g (04/29/23 1308)   vancomycin 1,500 mg (04/28/23 1825)     LOS: 3 days   Time spent: 35 minutes  Carollee Herter, DO  [facility]  04/29/2023, 12:17 PM

## 2023-04-29 NOTE — Evaluation (Signed)
Clinical/Bedside Swallow Evaluation Patient Details  Name: [name] [name] MRN: [id number] Date of Birth: July 02, 1945  Today's Date: 04/29/2023 Time: SLP Start Time (ACUTE ONLY): 1055 SLP Stop Time (ACUTE ONLY): 1113 SLP Time Calculation (min) (ACUTE ONLY): 18 min  Past Medical History:  Past Medical History:  Diagnosis Date   Asthma    Diverticulitis    Hypothyroid    Psoriatic arthritis (HCC)    Past Surgical History:  Past Surgical History:  Procedure Laterality Date   COLONOSCOPY  08/2021   HIP SURGERY Bilateral    IR IMAGING GUIDED PORT INSERTION  02/20/2023   TONSILLECTOMY     TUBAL LIGATION     HPI:  Patient is a 77 y.o. female who presented on 11/08 with neutropenic fever. She had c/o lethargy and dysuria. PMH is significant for hypothyroidism, psoriatic arthritis, and myelodysplasia. ST swallow evaluation ordered on 11/10.    Assessment / Plan / Recommendation  Clinical Impression  Patient seen by SLP for bedside swallow evaluation per c/o difficulty swallowing pills. Patient was awake, alert, and cooperative throughout the session. She reported new difficulty with swallowing pills that emerged in the last week. Complaints of globus sensations with all foods that has resulted in her avoiding tougher and/or dryer foods. Patient indicted that she now takes smaller bites and thoroughly chews her food for fear of choking. She reported feeling "choked" on a pill in the last week and indicated soreness in her pharynx has persisted. SLP directly observed patient with thin liquid via straw and regular solid food. No overt s/sx of aspiration noted and swallow initiation appeared timely. Patient reported globus sensation with saltine crackers and followed bites with liquid washes. SLP to complete modified barium swallow study to further investigate symptomology. Patient may continue regular/thin liquid diet at this time. Medications to administered whole via puree. SLP Visit Diagnosis:  Dysphagia, unspecified (R13.10)    Aspiration Risk  No limitations    Diet Recommendation Regular;Thin liquid    Liquid Administration via: Cup;Straw Medication Administration: Whole meds with puree Supervision: Patient able to self feed Compensations: Minimize environmental distractions;Slow rate;Small sips/bites Postural Changes: Seated upright at 90 degrees    Other  Recommendations Oral Care Recommendations: Oral care BID    Recommendations for follow up therapy are one component of a multi-disciplinary discharge planning process, led by the attending physician.  Recommendations may be updated based on patient status, additional functional criteria and insurance authorization.  Follow up Recommendations        Assistance Recommended at Discharge    Functional Status Assessment    Frequency and Duration            Prognosis        Swallow Study   General Date of Onset: 04/26/23 HPI: Patient is a 77 y.o. female who presented on 11/08 with neutropenic fever. She had c/o lethargy and dysuria. PMH is significant for hypothyroidism, psoriatic arthritis, and myelodysplasia. ST swallow evaluation ordered on 11/10. Type of Study: Bedside Swallow Evaluation Previous Swallow Assessment: n/a Diet Prior to this Study: Regular;Thin liquids (Level 0)    Oral/Motor/Sensory Function Overall Oral Motor/Sensory Function: Within functional limits   Ice Chips Ice chips: Not tested   Thin Liquid Thin Liquid: Within functional limits Presentation: Straw    Nectar Thick Nectar Thick Liquid: Not tested   Honey Thick Honey Thick Liquid: Not tested   Puree Puree: Not tested   Solid     Solid: Impaired Presentation: Self Fed Pharyngeal Phase Impairments:  (C/o  globus sensation)      Marline Backbone, B.S., Speech Therapy Student   04/29/2023,12:02 PM

## 2023-04-29 NOTE — Progress Notes (Signed)
Mobility Specialist - Progress Note   04/29/23 0900  Mobility  Activity Ambulated with assistance in hallway  Level of Assistance Modified independent, requires aide device or extra time  Assistive Device Front wheel walker  Distance Ambulated (ft) 500 ft  Range of Motion/Exercises Active  Activity Response Tolerated well  Mobility Referral Yes  $Mobility charge 1 Mobility  Mobility Specialist Start Time (ACUTE ONLY) [id number]  Mobility Specialist Stop Time (ACUTE ONLY) 0910  Mobility Specialist Time Calculation (min) (ACUTE ONLY) 12 min   Received in bed and agreed to mobility. Had no issues throughout session, returned to bed with all needs met.  Marilynne Halsted Mobility Specialist

## 2023-04-29 NOTE — Procedures (Signed)
Modified Barium Swallow Study  Patient Details  Name: [name] [name] MRN: [id number] Date of Birth: 05/23/1946  Today's Date: 04/29/2023  Modified Barium Swallow completed.  Full report located under Chart Review in the Imaging Section.  History of Present Illness Patient is a 77 y.o. female who presented on 11/08 with neutropenic fever. She had c/o lethargy and dysuria. PMH is significant for hypothyroidism, psoriatic arthritis, and myelodysplasia. ST swallow evaluation ordered on 11/10. Patient complaints of globus sensation and difficulty swallowing pills; MBS ordered.   Clinical Impression Patient seen by SLP for modified barium swallow study per c/o globus sensation and difficulty swallowing pills emerging in the last week. Patient presents with a mild pharyngoesophageal dysphagia as evidenced by the results of this study. One instance of trace aspiration (PAS 7)  observed with cup sip of thin liquid. Penetration (PAS 3) occurred once with cup sip of nectar thick. No significant residuals were observed. Swallow initiation was delayed to the vallecula with puree, honey thick, and solid and to the posterior laryngeal surface of the epiglottis with thin and nectar thick. Hyolaryngeal excursion was incomplete, however epiglottic inversion was adequate. Slow, restricted transit of boluses seen in the thoracic esophagus following administration of barium tablet resulting in stasis. Solid consistency appeared to restrict flow of sequential honey thick sips. Retrograde of bolus to distal esophagus observed, although it did not flow through the PES. Barium tablet eventually cleared following administration of puree and nectar. Refer to fluoro images #[id number]. SLP recommending patient continue with a regular, thin liquid diet and seek further GI evaluation to address esophageal issues. Medications may be administered whole or crushed in puree. Patient was advised to thoroughly chew before swallowing,  take small, slow bites, and remain upright for 30 minutes following meals. She verbalized understanding. No further ST indicated at this time.   Swallow Evaluation Recommendations Recommendations: PO diet PO Diet Recommendation: Regular;Thin liquids (Level 0) Liquid Administration via: Cup;Straw Medication Administration: Whole meds with puree Supervision: Patient able to self-feed Swallowing strategies  : Minimize environmental distractions;Slow rate;Small bites/sips Postural changes: Position pt fully upright for meals;Stay upright 30-60 min after meals Oral care recommendations: Oral care BID (2x/day) Recommended consults: Consider GI consultation      Marline Backbone, B.S., Speech Therapy Student   04/29/2023,3:17 PM

## 2023-04-29 NOTE — TOC Transition Note (Signed)
Transition of Care [facility]) - CM/SW Discharge Note   Patient Details  Name: SHAHADAH PELOWSKI MRN: [id number] Date of Birth: 08/01/1945  [facility]) CM/SW Contact:  Darleene Cleaver, LCSW Phone Number: 04/29/2023, 3:10 PM   Clinical Narrative:     CSW received consult that patient does not have a PCP.  Patient does have a PCP, [facility].  Patient to call her PCP to make a hospital follow up appointment.  TOC signing off, please reconsult if other TOC needs arise.  Final next level of care: Home/Self Care Barriers to Discharge: Barriers Resolved   Patient Goals and CMS Choice   Choice offered to / list presented to : Patient  Discharge Placement                         Discharge Plan and Services Additional resources added to the After Visit Summary for                                       Social Determinants of Health (SDOH) Interventions SDOH Screenings   Food Insecurity: No Food Insecurity (04/26/2023)  Housing: Low Risk  (04/26/2023)  Transportation Needs: No Transportation Needs (04/26/2023)  Utilities: Not At Risk (04/26/2023)  Depression (PHQ2-9): Low Risk  (11/24/2022)  Tobacco Use: Medium Risk (04/26/2023)     Readmission Risk Interventions     No data to display

## 2023-04-29 NOTE — Plan of Care (Signed)

## 2023-04-30 ENCOUNTER — Inpatient Hospital Stay: Payer: Medicare Other

## 2023-04-30 LAB — CULTURE, BLOOD (ROUTINE X 2)
Culture: NO GROWTH
Culture: NO GROWTH

## 2023-05-01 ENCOUNTER — Inpatient Hospital Stay (HOSPITAL_BASED_OUTPATIENT_CLINIC_OR_DEPARTMENT_OTHER): Payer: Medicare Other | Admitting: Hematology and Oncology

## 2023-05-01 ENCOUNTER — Inpatient Hospital Stay: Payer: Medicare Other

## 2023-05-01 ENCOUNTER — Telehealth: Payer: Self-pay | Admitting: *Deleted

## 2023-05-01 VITALS — BP 119/60 | HR 94 | Temp 97.5°F | Resp 16 | Wt 182.8 lb

## 2023-05-01 DIAGNOSIS — R5081 Fever presenting with conditions classified elsewhere: Secondary | ICD-10-CM | POA: Diagnosis not present

## 2023-05-01 DIAGNOSIS — D46Z Other myelodysplastic syndromes: Secondary | ICD-10-CM | POA: Diagnosis not present

## 2023-05-01 DIAGNOSIS — R110 Nausea: Secondary | ICD-10-CM | POA: Diagnosis not present

## 2023-05-01 DIAGNOSIS — D709 Neutropenia, unspecified: Secondary | ICD-10-CM | POA: Diagnosis not present

## 2023-05-01 DIAGNOSIS — Z91030 Bee allergy status: Secondary | ICD-10-CM | POA: Diagnosis not present

## 2023-05-01 DIAGNOSIS — Z87440 Personal history of urinary (tract) infections: Secondary | ICD-10-CM | POA: Diagnosis not present

## 2023-05-01 DIAGNOSIS — D696 Thrombocytopenia, unspecified: Secondary | ICD-10-CM | POA: Diagnosis present

## 2023-05-01 DIAGNOSIS — J4540 Moderate persistent asthma, uncomplicated: Secondary | ICD-10-CM | POA: Diagnosis not present

## 2023-05-01 DIAGNOSIS — D469 Myelodysplastic syndrome, unspecified: Secondary | ICD-10-CM | POA: Diagnosis present

## 2023-05-01 DIAGNOSIS — Z818 Family history of other mental and behavioral disorders: Secondary | ICD-10-CM | POA: Diagnosis not present

## 2023-05-01 DIAGNOSIS — Z5111 Encounter for antineoplastic chemotherapy: Secondary | ICD-10-CM | POA: Diagnosis present

## 2023-05-01 DIAGNOSIS — Z825 Family history of asthma and other chronic lower respiratory diseases: Secondary | ICD-10-CM | POA: Diagnosis not present

## 2023-05-01 DIAGNOSIS — Z8261 Family history of arthritis: Secondary | ICD-10-CM | POA: Diagnosis not present

## 2023-05-01 DIAGNOSIS — R5383 Other fatigue: Secondary | ICD-10-CM | POA: Diagnosis not present

## 2023-05-01 DIAGNOSIS — Z87891 Personal history of nicotine dependence: Secondary | ICD-10-CM | POA: Diagnosis not present

## 2023-05-01 DIAGNOSIS — Z9851 Tubal ligation status: Secondary | ICD-10-CM | POA: Diagnosis not present

## 2023-05-01 DIAGNOSIS — Z9089 Acquired absence of other organs: Secondary | ICD-10-CM | POA: Diagnosis not present

## 2023-05-01 DIAGNOSIS — R350 Frequency of micturition: Secondary | ICD-10-CM | POA: Diagnosis not present

## 2023-05-01 DIAGNOSIS — Z79899 Other long term (current) drug therapy: Secondary | ICD-10-CM | POA: Diagnosis not present

## 2023-05-01 LAB — CBC WITH DIFFERENTIAL/PLATELET
Abs Immature Granulocytes: 0.01 10*3/uL (ref 0.00–0.07)
Basophils Absolute: 0 10*3/uL (ref 0.0–0.1)
Basophils Relative: 0 %
Eosinophils Absolute: 0 10*3/uL (ref 0.0–0.5)
Eosinophils Relative: 0 %
HCT: 25 % — ABNORMAL LOW (ref 36.0–46.0)
Hemoglobin: 8 g/dL — ABNORMAL LOW (ref 12.0–15.0)
Immature Granulocytes: 1 %
Lymphocytes Relative: 84 %
Lymphs Abs: 1.4 10*3/uL (ref 0.7–4.0)
MCH: 31 pg (ref 26.0–34.0)
MCHC: 32 g/dL (ref 30.0–36.0)
MCV: 96.9 fL (ref 80.0–100.0)
Monocytes Absolute: 0.1 10*3/uL (ref 0.1–1.0)
Monocytes Relative: 8 %
Neutro Abs: 0.1 10*3/uL — CL (ref 1.7–7.7)
Neutrophils Relative %: 7 %
Platelets: 43 10*3/uL — ABNORMAL LOW (ref 150–400)
RBC: 2.58 MIL/uL — ABNORMAL LOW (ref 3.87–5.11)
RDW: 22.8 % — ABNORMAL HIGH (ref 11.5–15.5)
WBC: 1.7 10*3/uL — ABNORMAL LOW (ref 4.0–10.5)
nRBC: 0 % (ref 0.0–0.2)

## 2023-05-01 MED ORDER — SODIUM CHLORIDE 0.9 % IV SOLN: Freq: Once | INTRAVENOUS | Status: AC

## 2023-05-01 MED ORDER — PROCHLORPERAZINE MALEATE 10 MG PO TABS
10.0000 mg | ORAL_TABLET | Freq: Once | ORAL | Status: AC
  Administered 2023-05-01: 10 mg via ORAL
  Filled 2023-05-01: qty 1

## 2023-05-01 MED ORDER — SODIUM CHLORIDE 0.9% FLUSH
10.0000 mL | Freq: Once | INTRAVENOUS | Status: AC
  Administered 2023-05-01: 10 mL

## 2023-05-01 MED ORDER — FLUCONAZOLE 200 MG PO TABS: 200.0000 mg | ORAL_TABLET | Freq: Every day | ORAL | 0 refills | Status: DC

## 2023-05-01 MED ORDER — SODIUM CHLORIDE 0.9 % IV SOLN
20.0000 mg/m2 | Freq: Once | INTRAVENOUS | Status: AC
  Administered 2023-05-01: 40 mg via INTRAVENOUS
  Filled 2023-05-01: qty 8

## 2023-05-01 MED ORDER — HEPARIN SOD (PORK) LOCK FLUSH 100 UNIT/ML IV SOLN
500.0000 [IU] | Freq: Once | INTRAVENOUS | Status: AC
  Administered 2023-05-01: 500 [IU]

## 2023-05-01 MED ORDER — HEPARIN SOD (PORK) LOCK FLUSH 100 UNIT/ML IV SOLN
500.0000 [IU] | Freq: Once | INTRAVENOUS | Status: AC | PRN
  Administered 2023-05-01: 500 [IU]

## 2023-05-01 MED ORDER — SODIUM CHLORIDE 0.9% FLUSH
10.0000 mL | INTRAVENOUS | Status: DC | PRN
  Administered 2023-05-01: 10 mL

## 2023-05-01 NOTE — Assessment & Plan Note (Signed)
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   4 points - IPSS-R Score Intermediate risk Median survival - 3 yrs Median time to 25% AML evolution: 3.2 years   01/07/2023: Bone marrow aspirate: Hypercellular bone marrow 50% cellularity with dysplasia, flow cytometry: 5% CD34 positive blasts, cytogenetics: Normal MDS [employer] panel showed ASXL , CBL, IDH2 and SRSF2.    She was treated with azacitidine but unfortunately 3 days into treatment developed a rash likely secondary to azacitidine.  She is now on decitabine.  Dysphagia New onset, possibly secondary to candidiasophagitis. Swallow study showed no mechanical obstruction. -Start Fluconazole 1 tablet daily for 3 days.  Myelodysplastic Syndrome Hemoglobin 8 g/dL, platelets 16,109, WBC improved. Patient is  -Resume Decitabine injections, starting today for 3 days this week and 2 days next week. Continue CBC weekly and transfuse to maintain hb of 7-8 and plts of 20K  Pruritic Rash Improved with topical Benadryl cream. -Refill prescription for topical Benadryl cream.  General Health Maintenance -Follow-up after completion of Decitabine injections.

## 2023-05-01 NOTE — Telephone Encounter (Signed)
Proceed with treatment today despite abnormal lab values of WBC,AND and Plts per MD.

## 2023-05-01 NOTE — Patient Instructions (Signed)
[facility] - A DEPT OF [facility]  Discharge Instructions: Thank you for choosing [facility] to provide your oncology and hematology care.   If you have a lab appointment with the Cancer Center, please go directly to the Cancer Center and check in at the registration area.   Wear comfortable clothing and clothing appropriate for easy access to any Portacath or PICC line.   We strive to give you quality time with your provider. You may need to reschedule your appointment if you arrive late (15 or more minutes).  Arriving late affects you and other patients whose appointments are after yours.  Also, if you miss three or more appointments without notifying the office, you may be dismissed from the clinic at the provider's discretion.      For prescription refill requests, have your pharmacy contact our office and allow 72 hours for refills to be completed.    Today you received the following chemotherapy and/or immunotherapy agents: decitabine      To help prevent nausea and vomiting after your treatment, we encourage you to take your nausea medication as directed.  BELOW ARE SYMPTOMS THAT SHOULD BE REPORTED IMMEDIATELY: *FEVER GREATER THAN 100.4 F (38 C) OR HIGHER *CHILLS OR SWEATING *NAUSEA AND VOMITING THAT IS NOT CONTROLLED WITH YOUR NAUSEA MEDICATION *UNUSUAL SHORTNESS OF BREATH *UNUSUAL BRUISING OR BLEEDING *URINARY PROBLEMS (pain or burning when urinating, or frequent urination) *BOWEL PROBLEMS (unusual diarrhea, constipation, pain near the anus) TENDERNESS IN MOUTH AND THROAT WITH OR WITHOUT PRESENCE OF ULCERS (sore throat, sores in mouth, or a toothache) UNUSUAL RASH, SWELLING OR PAIN  UNUSUAL VAGINAL DISCHARGE OR ITCHING   Items with * indicate a potential emergency and should be followed up as soon as possible or go to the Emergency Department if any problems should occur.  Please show the CHEMOTHERAPY ALERT CARD or  IMMUNOTHERAPY ALERT CARD at check-in to the Emergency Department and triage nurse.  Should you have questions after your visit or need to cancel or reschedule your appointment, please contact [facility] - A DEPT OF [facility]  Dept: [phone]  and follow the prompts.  Office hours are 8:00 a.m. to 4:30 p.m. Monday - Friday. Please note that voicemails left after 4:00 p.m. may not be returned until the following business day.  We are closed weekends and major holidays. You have access to a nurse at all times for urgent questions. Please call the main number to the clinic Dept: [phone] and follow the prompts.   For any non-urgent questions, you may also contact your provider using MyChart. We now offer e-Visits for anyone 10 and older to request care online for non-urgent symptoms. For details visit [email or web address].   Also download the MyChart app! Go to the app store, search "MyChart", open the app, select [place], and log in with your MyChart username and password.

## 2023-05-01 NOTE — Progress Notes (Signed)
[facility] Cancer Follow up:    [name], [name], MD [address] [place] [address]   DIAGNOSIS: Myelodysplastic Syndrome  SUMMARY OF ONCOLOGIC HISTORY: Oncology History  MDS (myelodysplastic syndrome), high grade (HCC)  01/07/2023 Initial Biopsy   Bone marrow biopsy:cellular bone marrow with focal areas of hypercellularity (approximately up to  50%).  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  Blasts appear mildly mildly increased, approximately 5% to focally up to 10%.  Flow cytometric analysis reveals 5% CD34 positive blasts.  While the findings could be attributable to the patient's coexisting conditions, the possibility of a myeloid neoplasm such as myelodysplastic neoplasm with increased blasts cannot be entirely excluded. normal karyotype   01/29/2023 Initial Diagnosis   MDS (myelodysplastic syndrome), high grade (HCC)   02/04/2023 - 02/06/2023 Chemotherapy   Patient is on Treatment Plan : MYELODYSPLASIA  Azacitidine IV D1-5 q28d     03/04/2023 -  Chemotherapy   Patient is on Treatment Plan : MYELODYSPLASIA Decitabine D1-5 q28d       CURRENT THERAPY: Azacitidine  [name] [name] 77 y.o. female returns for follow-up after receiving azacitidine.  Unfortunately 2 to 3 days into the azacitidine she developed a rash and azacitidine was held.  She was switched to decitabine.  Discussed the use of AI scribe software for clinical note transcription with the patient, who gave verbal consent to proceed.  History of Present Illness    [name] [name] is here for follow-up.   Since her last visit here, she had a hospitalization since she was feeling very poorly and was severely neutropenic.  They apparently treated her for possible infection, no cultures were positive.  She had a headache during the hospitalization which has now resolved.  No concerns for bleeding.  She is okay with getting the decitabine today.  She continues to have a rash which is migratory  currently it is located on the back and legs and was hoping to get a refill for topical Benadryl. Rest of the pertinent 10 point ROS reviewed and negative  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) 04/26/2023   Hypokalemia 04/26/2023   Neutropenic fever (HCC) 04/25/2023   Hives 03/18/2023   MDS (myelodysplastic syndrome), high grade (HCC) 01/29/2023   Obesity 03/02/2021   Other long term (current) drug therapy 03/02/2021   Primary osteoarthritis 03/02/2021   SOB (shortness of breath) 12/08/2020   Pericardial effusion 12/08/2020   Dry eyes/dry mouth 05/25/2019   Bronchitis, mucopurulent recurrent (HCC) 02/26/2019   Moderate persistent asthma without complication 02/24/2019   Perennial and seasonal allergic rhinitis 02/24/2019   Seasonal allergic conjunctivitis 02/24/2019   Primary osteoarthritis of both knees 04/10/2016   Lung nodule 08/19/2014   HTN (hypertension) 08/07/2012   HLD (hyperlipidemia) 09/26/2011   Asthma 09/06/2010   Depression 09/06/2010   History of allergy 09/06/2010   Hypothyroidism 09/06/2010   Lower back pain 09/06/2010   Arthritis 09/06/2010   Pain in joint, pelvic region and thigh 09/06/2010   Peripheral neuropathy 09/06/2010   Psoriasis 09/06/2010   Therapeutic drug monitoring 09/06/2010   Tinnitus 09/06/2010   Vitamin B 12 deficiency 09/06/2010   Cervical spondylosis without myelopathy 04/24/2006   Tear of lateral cartilage or meniscus of knee, current 03/13/2006    is allergic to bee pollen, latex, pollen extract-tree extract [pollen extract], cat hair extract, dust mite extract, and molds & smuts.  MEDICAL HISTORY: Past Medical History:  Diagnosis Date   Asthma    Diverticulitis    Hypothyroid    Psoriatic arthritis (HCC)     SURGICAL HISTORY: Past Surgical History:  Procedure Laterality Date   COLONOSCOPY  08/2021   HIP SURGERY Bilateral    IR IMAGING GUIDED PORT  INSERTION  02/20/2023   TONSILLECTOMY     TUBAL LIGATION      SOCIAL HISTORY: Social History   Socioeconomic History   Marital status: Widowed    Spouse name: Not on file   Number of children: Not on file   Years of education: Not on file   Highest education level: Not on file  Occupational History   Not on file  Tobacco Use   Smoking status: Former    Current packs/day: 0.00    Average packs/day: 0.1 packs/day for 33.4 years (3.3 ttl pk-yrs)    Types: Cigarettes    Start date: 37    Quit date: 11/18/2000    Years since quitting: 22.4   Smokeless tobacco: Never   Tobacco comments:    3 cigarettes a day  Vaping Use   Vaping status: Never Used  Substance and Sexual Activity   Alcohol use: Never   Drug use: Never   Sexual activity: Not Currently  Other Topics Concern   Not on file  Social History Narrative   Lives with children   Social Determinants of Health   Financial Resource Strain: Not on file  Food Insecurity: No Food Insecurity (04/26/2023)   Hunger Vital Sign    Worried About Running Out of Food in the Last Year: Never true    Ran Out of Food in the Last Year: Never true  Transportation Needs: No Transportation Needs (04/26/2023)   PRAPARE - Administrator, Civil Service (Medical): No    Lack of Transportation (Non-Medical): No  Physical Activity: Not on file  Stress: Not on file  Social Connections: Not on file  Intimate Partner Violence: Not At Risk (04/26/2023)   Humiliation, Afraid, Rape, and Kick questionnaire    Fear of Current or Ex-Partner: No    Emotionally Abused: No    Physically Abused: No    Sexually Abused: No    FAMILY HISTORY: Family History  Problem Relation Age of Onset   Asthma Mother    Allergic rhinitis Mother    Rheum arthritis Mother    Allergic rhinitis Father    Alzheimer's disease Father    Eczema Brother    Allergic rhinitis Brother    Colon cancer Neg Hx    Esophageal cancer Neg Hx    Stomach cancer Neg Hx     Rectal cancer Neg Hx     Review of Systems  Constitutional:  Negative for appetite change, chills, fatigue, fever and unexpected weight change.  HENT:   Negative for hearing loss, lump/mass and trouble swallowing.   Eyes:  Negative for eye problems and icterus.  Respiratory:  Negative for chest tightness, cough and shortness of breath.   Cardiovascular:  Negative for chest pain, leg swelling and palpitations.  Gastrointestinal:  Negative for abdominal distention, abdominal pain, constipation, diarrhea, nausea and vomiting.  Endocrine: Negative for hot flashes.  Genitourinary:  Negative for difficulty urinating.   Musculoskeletal:  Negative for arthralgias.  Skin:  Negative for itching and rash.  Neurological:  Negative for dizziness, extremity weakness, headaches and numbness.  Hematological:  Negative for adenopathy. Does not bruise/bleed easily.  Psychiatric/Behavioral:  Negative for depression. The patient is not nervous/anxious.  PHYSICAL EXAMINATION     Vitals:   05/01/23 1159  BP: 119/60  Pulse: 94  Resp: 16  Temp: (!) 97.5 F (36.4 C)  SpO2: 100%    HEENT: Oropharynx without lesions. CHEST: Breath sounds clear. SKIN:  Rash stable Abdomen: Soft, nontender, nondistended No lower extremity edema, chronic venous stasis changes noted  LABORATORY DATA:  CBC    Component Value Date/Time   WBC 1.7 (L) 05/01/2023 1138   RBC 2.58 (L) 05/01/2023 1138   HGB 8.0 (L) 05/01/2023 1138   HGB 8.5 (L) 04/25/2023 1049   HCT 25.0 (L) 05/01/2023 1138   HCT 39.0 11/24/2022 1035   PLT 43 (L) 05/01/2023 1138   PLT 53 (L) 04/25/2023 1049   MCV 96.9 05/01/2023 1138   MCH 31.0 05/01/2023 1138   MCHC 32.0 05/01/2023 1138   RDW 22.8 (H) 05/01/2023 1138   LYMPHSABS PENDING 05/01/2023 1138   MONOABS PENDING 05/01/2023 1138   EOSABS PENDING 05/01/2023 1138   BASOSABS PENDING 05/01/2023 1138    CMP     Component Value Date/Time   NA 134 (L) 04/27/2023 0622   K 3.4  (L) 04/27/2023 0622   CL 104 04/27/2023 0622   CO2 23 04/27/2023 0622   GLUCOSE 100 (H) 04/27/2023 0622   BUN 8 04/27/2023 0622   CREATININE 0.36 (L) 04/27/2023 0622   CREATININE 0.45 04/25/2023 1049   CALCIUM 8.3 (L) 04/27/2023 0622   PROT 6.5 04/27/2023 0622   ALBUMIN 3.2 (L) 04/27/2023 0622   AST 12 (L) 04/27/2023 0622   AST 12 (L) 04/25/2023 1049   ALT 9 04/27/2023 0622   ALT 6 04/25/2023 1049   ALKPHOS 35 (L) 04/27/2023 0622   BILITOT 0.5 04/27/2023 0622   BILITOT 0.6 04/25/2023 1049   GFRNONAA >60 04/27/2023 0622   GFRNONAA >60 04/25/2023 1049    ASSESSMENT and THERAPY PLAN:   MDS (myelodysplastic syndrome), high grade (HCC) This is a very pleasant 77 year old female patient with MDS, ASXL1 mutation, CBL, 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   4 points - IPSS-R Score Intermediate risk Median survival - 3 yrs Median time to 25% AML evolution: 3.2 years   01/07/2023: Bone marrow aspirate: Hypercellular bone marrow 50% cellularity with dysplasia, flow cytometry: 5% CD34 positive blasts, cytogenetics: Normal MDS [employer] panel showed ASXL , CBL, IDH2 and SRSF2.    She was treated with azacitidine but unfortunately 3 days into treatment developed a rash likely secondary to azacitidine.  She is now on decitabine.  Dysphagia New onset, possibly secondary to candidiasophagitis. Swallow study showed no mechanical obstruction. -Start Fluconazole 1 tablet daily for 3 days.  Myelodysplastic Syndrome Hemoglobin 8 g/dL, platelets 57,846, WBC improved. Patient is  -Resume Decitabine injections, starting today for 3 days this week and 2 days next week. Continue CBC weekly and transfuse to maintain hb of 7-8 and plts of 20K  Pruritic Rash Improved with topical Benadryl cream. -Refill prescription for topical Benadryl cream.  General Health Maintenance -Follow-up after completion of Decitabine injections.      All questions were  answered. The patient knows to call the clinic with any problems, questions or concerns. We can certainly see the patient much sooner if necessary.  Total encounter time:30 minutes*in face-to-face visit time, chart review, lab review, care coordination, order entry, and documentation of the encounter time.    *Total Encounter Time as defined by the Centers for Medicare and Medicaid Services includes, in addition to the face-to-face time  of a patient visit (documented in the note above) non-face-to-face time: obtaining and reviewing outside history, ordering and reviewing medications, tests or procedures, care coordination (communications with other health care professionals or caregivers) and documentation in the medical record.

## 2023-05-02 ENCOUNTER — Inpatient Hospital Stay: Payer: Medicare Other

## 2023-05-02 VITALS — BP 113/62 | HR 84 | Temp 98.6°F | Resp 18 | Wt 184.0 lb

## 2023-05-02 DIAGNOSIS — D46Z Other myelodysplastic syndromes: Secondary | ICD-10-CM

## 2023-05-02 DIAGNOSIS — Z5111 Encounter for antineoplastic chemotherapy: Secondary | ICD-10-CM | POA: Diagnosis not present

## 2023-05-02 MED ORDER — HEPARIN SOD (PORK) LOCK FLUSH 100 UNIT/ML IV SOLN
500.0000 [IU] | Freq: Once | INTRAVENOUS | Status: AC | PRN
  Administered 2023-05-02: 500 [IU]

## 2023-05-02 MED ORDER — PROCHLORPERAZINE MALEATE 10 MG PO TABS
10.0000 mg | ORAL_TABLET | Freq: Once | ORAL | Status: AC
  Administered 2023-05-02: 10 mg via ORAL
  Filled 2023-05-02: qty 1

## 2023-05-02 MED ORDER — SODIUM CHLORIDE 0.9% FLUSH
10.0000 mL | INTRAVENOUS | Status: DC | PRN
  Administered 2023-05-02: 10 mL

## 2023-05-02 MED ORDER — SODIUM CHLORIDE 0.9 % IV SOLN: Freq: Once | INTRAVENOUS | Status: AC

## 2023-05-02 MED ORDER — SODIUM CHLORIDE 0.9 % IV SOLN
20.0000 mg/m2 | Freq: Once | INTRAVENOUS | Status: AC
  Administered 2023-05-02: 40 mg via INTRAVENOUS
  Filled 2023-05-02: qty 8

## 2023-05-02 NOTE — Patient Instructions (Signed)
[facility] - A DEPT OF [facility]  Discharge Instructions: Thank you for choosing [facility] to provide your oncology and hematology care.   If you have a lab appointment with the Cancer Center, please go directly to the Cancer Center and check in at the registration area.   Wear comfortable clothing and clothing appropriate for easy access to any Portacath or PICC line.   We strive to give you quality time with your provider. You may need to reschedule your appointment if you arrive late (15 or more minutes).  Arriving late affects you and other patients whose appointments are after yours.  Also, if you miss three or more appointments without notifying the office, you may be dismissed from the clinic at the provider's discretion.      For prescription refill requests, have your pharmacy contact our office and allow 72 hours for refills to be completed.    Today you received the following chemotherapy and/or immunotherapy agents: decitabine      To help prevent nausea and vomiting after your treatment, we encourage you to take your nausea medication as directed.  BELOW ARE SYMPTOMS THAT SHOULD BE REPORTED IMMEDIATELY: *FEVER GREATER THAN 100.4 F (38 C) OR HIGHER *CHILLS OR SWEATING *NAUSEA AND VOMITING THAT IS NOT CONTROLLED WITH YOUR NAUSEA MEDICATION *UNUSUAL SHORTNESS OF BREATH *UNUSUAL BRUISING OR BLEEDING *URINARY PROBLEMS (pain or burning when urinating, or frequent urination) *BOWEL PROBLEMS (unusual diarrhea, constipation, pain near the anus) TENDERNESS IN MOUTH AND THROAT WITH OR WITHOUT PRESENCE OF ULCERS (sore throat, sores in mouth, or a toothache) UNUSUAL RASH, SWELLING OR PAIN  UNUSUAL VAGINAL DISCHARGE OR ITCHING   Items with * indicate a potential emergency and should be followed up as soon as possible or go to the Emergency Department if any problems should occur.  Please show the CHEMOTHERAPY ALERT CARD or  IMMUNOTHERAPY ALERT CARD at check-in to the Emergency Department and triage nurse.  Should you have questions after your visit or need to cancel or reschedule your appointment, please contact [facility] - A DEPT OF [facility]  Dept: [phone]  and follow the prompts.  Office hours are 8:00 a.m. to 4:30 p.m. Monday - Friday. Please note that voicemails left after 4:00 p.m. may not be returned until the following business day.  We are closed weekends and major holidays. You have access to a nurse at all times for urgent questions. Please call the main number to the clinic Dept: [phone] and follow the prompts.   For any non-urgent questions, you may also contact your provider using MyChart. We now offer e-Visits for anyone 10 and older to request care online for non-urgent symptoms. For details visit [email or web address].   Also download the MyChart app! Go to the app store, search "MyChart", open the app, select [place], and log in with your MyChart username and password.

## 2023-05-03 ENCOUNTER — Inpatient Hospital Stay: Payer: Medicare Other

## 2023-05-03 ENCOUNTER — Other Ambulatory Visit: Payer: Medicare Other

## 2023-05-03 VITALS — BP 127/74 | HR 82 | Temp 97.7°F | Resp 18

## 2023-05-03 DIAGNOSIS — D46Z Other myelodysplastic syndromes: Secondary | ICD-10-CM

## 2023-05-03 DIAGNOSIS — Z5111 Encounter for antineoplastic chemotherapy: Secondary | ICD-10-CM | POA: Diagnosis not present

## 2023-05-03 MED ORDER — PROCHLORPERAZINE MALEATE 10 MG PO TABS
10.0000 mg | ORAL_TABLET | Freq: Once | ORAL | Status: AC
Start: 2023-05-03 — End: 2023-05-03
  Administered 2023-05-03: 10 mg via ORAL
  Filled 2023-05-03: qty 1

## 2023-05-03 MED ORDER — SODIUM CHLORIDE 0.9 % IV SOLN
20.0000 mg/m2 | Freq: Once | INTRAVENOUS | Status: AC
  Administered 2023-05-03: 40 mg via INTRAVENOUS
  Filled 2023-05-03: qty 8

## 2023-05-03 MED ORDER — HEPARIN SOD (PORK) LOCK FLUSH 100 UNIT/ML IV SOLN
500.0000 [IU] | Freq: Once | INTRAVENOUS | Status: AC | PRN
  Administered 2023-05-03: 500 [IU]

## 2023-05-03 MED ORDER — SODIUM CHLORIDE 0.9% FLUSH
10.0000 mL | INTRAVENOUS | Status: DC | PRN
  Administered 2023-05-03: 10 mL

## 2023-05-03 MED ORDER — SODIUM CHLORIDE 0.9 % IV SOLN: Freq: Once | INTRAVENOUS | Status: AC

## 2023-05-03 NOTE — Patient Instructions (Signed)
 [facility] - [facility] DE[facility]  Discharge Instructions: Thank you for choosing [facility] to provide your oncology and hematology care.   If you have a lab appointment with the Cancer Center, please go directly to the Cancer Center and check in at the registration area.   Wear comfortable clothing and clothing appropriate for easy access to any Portacath or PICC line.   We strive to give you quality time with your provider. You may need to reschedule your appointment if you arrive late (15 or more minutes).  Arriving late affects you and other patients whose appointments are after yours.  Also, if you miss three or more appointments without notifying the office, you may be dismissed from the clinic at the provider's discretion.      For prescription refill requests, have your pharmacy contact our office and allow 72 hours for refills to be completed.    Today you received the following chemotherapy and/or immunotherapy agents: decitabine      To help prevent nausea and vomiting after your treatment, we encourage you to take your nausea medication as directed.  BELOW ARE SYMPTOMS THAT SHOULD BE REPORTED IMMEDIATELY: *FEVER GREATER THAN 100.4 F (38 C) OR HIGHER *CHILLS OR SWEATING *NAUSEA AND VOMITING THAT IS NOT CONTROLLED WITH YOUR NAUSEA MEDICATION *UNUSUAL SHORTNESS OF BREATH *UNUSUAL BRUISING OR BLEEDING *URINARY PROBLEMS (pain or burning when urinating, or frequent urination) *BOWEL PROBLEMS (unusual diarrhea, constipation, pain near the anus) TENDERNESS IN MOUTH AND THROAT WITH OR WITHOUT PRESENCE OF ULCERS (sore throat, sores in mouth, or a toothache) UNUSUAL RASH, SWELLING OR PAIN  UNUSUAL VAGINAL DISCHARGE OR ITCHING   Items with * indicate a potential emergency and should be followed up as soon as possible or go to the Emergency Department if any problems should occur.  Please show the CHEMOTHERAPY ALERT CARD or  IMMUNOTHERAPY ALERT CARD at check-in to the Emergency Department and triage nurse.  Should you have questions after your visit or need to cancel or reschedule your appointment, please contact [facility] - A DEPT OF [facility]  Dept: [phone]  and follow the prompts.  Office hours are 8:00 a.m. to 4:30 p.m. Monday - Friday. Please note that voicemails left after 4:00 p.m. may not be returned until the following business day.  We are closed weekends and major holidays. You have access to a nurse at all times for urgent questions. Please call the main number to the clinic Dept: [phone] and follow the prompts.   For any non-urgent questions, you may also contact your provider using MyChart. We now offer e-Visits for anyone 10 and older to request care online for non-urgent symptoms. For details visit [email or web address].   Also download the MyChart app! Go to the app store, search "MyChart", open the app, select [place], and log in with your MyChart username and password.

## 2023-05-06 ENCOUNTER — Other Ambulatory Visit: Payer: Self-pay

## 2023-05-06 ENCOUNTER — Inpatient Hospital Stay: Payer: Medicare Other

## 2023-05-06 VITALS — BP 124/77 | HR 79 | Temp 98.0°F | Resp 16 | Wt 182.1 lb

## 2023-05-06 DIAGNOSIS — D46Z Other myelodysplastic syndromes: Secondary | ICD-10-CM

## 2023-05-06 DIAGNOSIS — Z5111 Encounter for antineoplastic chemotherapy: Secondary | ICD-10-CM | POA: Diagnosis not present

## 2023-05-06 MED ORDER — SODIUM CHLORIDE 0.9 % IV SOLN
20.0000 mg/m2 | Freq: Once | INTRAVENOUS | Status: AC
  Administered 2023-05-06: 40 mg via INTRAVENOUS
  Filled 2023-05-06: qty 8

## 2023-05-06 MED ORDER — HEPARIN SOD (PORK) LOCK FLUSH 100 UNIT/ML IV SOLN
500.0000 [IU] | Freq: Once | INTRAVENOUS | Status: AC | PRN
  Administered 2023-05-06: 500 [IU]

## 2023-05-06 MED ORDER — SODIUM CHLORIDE 0.9% FLUSH
10.0000 mL | INTRAVENOUS | Status: DC | PRN
Start: 2023-05-06 — End: 2023-05-06
  Administered 2023-05-06: 10 mL

## 2023-05-06 MED ORDER — SODIUM CHLORIDE 0.9 % IV SOLN: Freq: Once | INTRAVENOUS | Status: AC

## 2023-05-06 MED ORDER — PROCHLORPERAZINE MALEATE 10 MG PO TABS
10.0000 mg | ORAL_TABLET | Freq: Once | ORAL | Status: AC
  Administered 2023-05-06: 10 mg via ORAL
  Filled 2023-05-06: qty 1

## 2023-05-06 NOTE — Patient Instructions (Signed)
 [facility] - [facility] DE[facility]  Discharge Instructions: Thank you for choosing [facility] to provide your oncology and hematology care.   If you have a lab appointment with the Cancer Center, please go directly to the Cancer Center and check in at the registration area.   Wear comfortable clothing and clothing appropriate for easy access to any Portacath or PICC line.   We strive to give you quality time with your provider. You may need to reschedule your appointment if you arrive late (15 or more minutes).  Arriving late affects you and other patients whose appointments are after yours.  Also, if you miss three or more appointments without notifying the office, you may be dismissed from the clinic at the provider's discretion.      For prescription refill requests, have your pharmacy contact our office and allow 72 hours for refills to be completed.    Today you received the following chemotherapy and/or immunotherapy agents: decitabine      To help prevent nausea and vomiting after your treatment, we encourage you to take your nausea medication as directed.  BELOW ARE SYMPTOMS THAT SHOULD BE REPORTED IMMEDIATELY: *FEVER GREATER THAN 100.4 F (38 C) OR HIGHER *CHILLS OR SWEATING *NAUSEA AND VOMITING THAT IS NOT CONTROLLED WITH YOUR NAUSEA MEDICATION *UNUSUAL SHORTNESS OF BREATH *UNUSUAL BRUISING OR BLEEDING *URINARY PROBLEMS (pain or burning when urinating, or frequent urination) *BOWEL PROBLEMS (unusual diarrhea, constipation, pain near the anus) TENDERNESS IN MOUTH AND THROAT WITH OR WITHOUT PRESENCE OF ULCERS (sore throat, sores in mouth, or a toothache) UNUSUAL RASH, SWELLING OR PAIN  UNUSUAL VAGINAL DISCHARGE OR ITCHING   Items with * indicate a potential emergency and should be followed up as soon as possible or go to the Emergency Department if any problems should occur.  Please show the CHEMOTHERAPY ALERT CARD or  IMMUNOTHERAPY ALERT CARD at check-in to the Emergency Department and triage nurse.  Should you have questions after your visit or need to cancel or reschedule your appointment, please contact [facility] - A DEPT OF [facility]  Dept: [phone]  and follow the prompts.  Office hours are 8:00 a.m. to 4:30 p.m. Monday - Friday. Please note that voicemails left after 4:00 p.m. may not be returned until the following business day.  We are closed weekends and major holidays. You have access to a nurse at all times for urgent questions. Please call the main number to the clinic Dept: [phone] and follow the prompts.   For any non-urgent questions, you may also contact your provider using MyChart. We now offer e-Visits for anyone 10 and older to request care online for non-urgent symptoms. For details visit [email or web address].   Also download the MyChart app! Go to the app store, search "MyChart", open the app, select [place], and log in with your MyChart username and password.

## 2023-05-07 ENCOUNTER — Inpatient Hospital Stay: Payer: Medicare Other

## 2023-05-07 ENCOUNTER — Other Ambulatory Visit: Payer: Self-pay

## 2023-05-07 VITALS — BP 122/65 | HR 81 | Temp 98.6°F | Resp 21

## 2023-05-07 DIAGNOSIS — D46Z Other myelodysplastic syndromes: Secondary | ICD-10-CM

## 2023-05-07 DIAGNOSIS — Z5111 Encounter for antineoplastic chemotherapy: Secondary | ICD-10-CM | POA: Diagnosis not present

## 2023-05-07 MED ORDER — PROCHLORPERAZINE MALEATE 10 MG PO TABS
10.0000 mg | ORAL_TABLET | Freq: Once | ORAL | Status: AC
  Administered 2023-05-07: 10 mg via ORAL
  Filled 2023-05-07: qty 1

## 2023-05-07 MED ORDER — SODIUM CHLORIDE 0.9% FLUSH
10.0000 mL | INTRAVENOUS | Status: DC | PRN
  Administered 2023-05-07: 10 mL

## 2023-05-07 MED ORDER — SODIUM CHLORIDE 0.9 % IV SOLN
20.0000 mg/m2 | Freq: Once | INTRAVENOUS | Status: AC
  Administered 2023-05-07: 40 mg via INTRAVENOUS
  Filled 2023-05-07: qty 8

## 2023-05-07 MED ORDER — SODIUM CHLORIDE 0.9 % IV SOLN: Freq: Once | INTRAVENOUS | Status: AC

## 2023-05-07 MED ORDER — HEPARIN SOD (PORK) LOCK FLUSH 100 UNIT/ML IV SOLN
500.0000 [IU] | Freq: Once | INTRAVENOUS | Status: AC | PRN
  Administered 2023-05-07: 500 [IU]

## 2023-05-07 NOTE — Patient Instructions (Signed)
[facility]  Discharge Instructions: Thank you for choosing [facility] to provide your oncology and hematology care.   If you have a lab appointment with the Cancer Center, please go directly to the Cancer Center and check in at the registration area.   Wear comfortable clothing and clothing appropriate for easy access to any Portacath or PICC line.   We strive to give you quality time with your provider. You may need to reschedule your appointment if you arrive late (15 or more minutes).  Arriving late affects you and other patients whose appointments are after yours.  Also, if you miss three or more appointments without notifying the office, you may be dismissed from the clinic at the provider's discretion.      For prescription refill requests, have your pharmacy contact our office and allow 72 hours for refills to be completed.    Today you received the following chemotherapy and/or immunotherapy agents: Dacogen      To help prevent nausea and vomiting after your treatment, we encourage you to take your nausea medication as directed.  BELOW ARE SYMPTOMS THAT SHOULD BE REPORTED IMMEDIATELY: *FEVER GREATER THAN 100.4 F (38 C) OR HIGHER *CHILLS OR SWEATING *NAUSEA AND VOMITING THAT IS NOT CONTROLLED WITH YOUR NAUSEA MEDICATION *UNUSUAL SHORTNESS OF BREATH *UNUSUAL BRUISING OR BLEEDING *URINARY PROBLEMS (pain or burning when urinating, or frequent urination) *BOWEL PROBLEMS (unusual diarrhea, constipation, pain near the anus) TENDERNESS IN MOUTH AND THROAT WITH OR WITHOUT PRESENCE OF ULCERS (sore throat, sores in mouth, or a toothache) UNUSUAL RASH, SWELLING OR PAIN  UNUSUAL VAGINAL DISCHARGE OR ITCHING   Items with * indicate a potential emergency and should be followed up as soon as possible or go to the Emergency Department if any problems should occur.  Please show the CHEMOTHERAPY ALERT CARD or IMMUNOTHERAPY  ALERT CARD at check-in to the Emergency Department and triage nurse.  Should you have questions after your visit or need to cancel or reschedule your appointment, please contact [facility] - A DEPT OF [facility]  Dept: [phone]  and follow the prompts.  Office hours are 8:00 a.m. to 4:30 p.m. Monday - Friday. Please note that voicemails left after 4:00 p.m. may not be returned until the following business day.  We are closed weekends and major holidays. You have access to a nurse at all times for urgent questions. Please call the main number to the clinic Dept: [phone] and follow the prompts.   For any non-urgent questions, you may also contact your provider using MyChart. We now offer e-Visits for anyone 63 and older to request care online for non-urgent symptoms. For details visit [email or web address].   Also download the MyChart app! Go to the app store, search "MyChart", open the app, select [place], and log in with your MyChart username and password.

## 2023-05-09 ENCOUNTER — Encounter: Payer: Self-pay | Admitting: Hematology and Oncology

## 2023-05-09 NOTE — Telephone Encounter (Signed)
No entry 

## 2023-05-21 ENCOUNTER — Ambulatory Visit: Payer: Medicare Other | Admitting: Allergy & Immunology

## 2023-05-26 ENCOUNTER — Encounter: Payer: Self-pay | Admitting: Hematology and Oncology

## 2023-05-27 ENCOUNTER — Telehealth: Payer: Self-pay

## 2023-05-27 ENCOUNTER — Other Ambulatory Visit: Payer: Medicare Other

## 2023-05-27 ENCOUNTER — Ambulatory Visit: Payer: Medicare Other

## 2023-05-27 ENCOUNTER — Telehealth: Payer: Self-pay | Admitting: *Deleted

## 2023-05-27 ENCOUNTER — Ambulatory Visit: Payer: Medicare Other | Admitting: Hematology and Oncology

## 2023-05-27 NOTE — Telephone Encounter (Signed)
Contacted patient following Mychart message -  "For several days I have felt short of breath ( my inhaler helps) my chest also feels tight. My ankles have been swelling. I'm tired as usual but I have no fever. I have an appointment with Dr Dellis Anes (asthma/allergy) on Thursday. Should I also see Dr. Al Pimple? Thanks"    [name] [name] said that she sees Dr. Dellis Anes tomorrow at 1115 - the office moved the appt up from Thursday. She thinks chest tightness could be related to being short of breath. She is using inhaler 2 times a day. Once she uses it, it is easier to breathe and chest is not as tight. Following inhaler use, she has a brief period of productive cough with clear sputum. Inquired if she had a pulse oximeter at home - she said she might. If she has one, encouraged her to check her oxygen level when she was feeling short of breath and note measurement in order to share info w/MD.    Her ankles have been swelling approximately 7-10 days, she isn't sure when it started. Her right ankle is swelling more than her left. She is elevating legs when seated or when in bed and reports that the swelling improves and/or is not as bad. She reports that her ankles are normal/not swollen when she first gets out of bed in the morning.  She said she saw cardiologist in the fall, was checked out and told to return in 2 years for follow up.    She has appts at [facility] 05/31/23 w/NP and for infusion. Advised her that Dr. Al Pimple will be sent this information for review.  Advised her that if she has chest pain or increasing shortness of breath that does not stop with inhaler to call 911. She verbalized understanding of all information

## 2023-05-27 NOTE — Telephone Encounter (Deleted)
Contacted patient following Mychart message -  "For several days I have felt short of breath ( my inhaler helps) my chest also feels tight. My ankles have been swelling. I'm tired as usual but I have no fever. I have an appointment with Dr Dellis Anes (asthma/allergy) on Thursday. Should I also see Dr. Al Pimple? Thanks"   [name] [name] said that she sees Dr. Dellis Anes tomorrow at 1115 - the office moved the appt up from Thursday. She thinks chest tightness could be related to being short of breath. She is using inhaler 2 times a day. Once she uses it, it is easier to breathe and chest is not as tight. Following inhaler use, she has a brief period of productive cough with clear sputum. Inquired if she had a pulse oximeter at home - she said she might. If she has one, encouraged her to check her oxygen level when she was feeling short of breath and note measurement in order to share info w/MD.   Her ankles have been swelling approximately 7-10 days, she isn't sure when it started. Her right ankle is swelling more than her left. She is elevating legs when seated or when in bed and reports that the swelling improves and/or is not as bad. She reports that her ankles are normal/not swollen when she first gets out of bed in the morning.  She said she saw cardiologist in the fall, was checked out and told to return in 2 years for follow up.   Advised her that Dr. Al Pimple will be sent this information for review.  Advised her that if she has chest pain or increasing shortness of breath that does not stop with inhaler to call 911. She verbalized understanding of all information

## 2023-05-27 NOTE — Telephone Encounter (Signed)
I would really like to see Dr Dellis Anes before Thursday. I'm short of breath and my chest hurts. I look forward to hearing from you. Thanks    Called patient - DOB verified - advised patient if shortness of breath and chest discomfort worsens to call 911 or go to the nearest ER for further evaluation and Treatment.  Patient stated since the swelling in her feet have decreased - she is feeling a little better. Patient  stated she did see her Cardiologist about 3 wks ago - everything checked out fine.    Patient stated she has been having the following symptoms for about 1 - 2 weeks:   Cough on/off Shortness of breath Chest soreness/discomfort  Per 03/18/23 office visit:  Asthma Moderately well-controlled  Restart Breztri 2 puffs twice a day with a spacer to prevent cough or wheeze Continue albuterol 2 puffs once every 4 hours as needed for cough or wheeze  Patient stated her inhalers have been helping her some.  Patient verbalized understanding to all - appointment has been scheduled for 05/28/23 @ 11:15 am w/Dr. Dellis Anes- no further questions.  Forwarding message to provider as update.

## 2023-05-28 ENCOUNTER — Ambulatory Visit: Payer: Medicare Other

## 2023-05-28 ENCOUNTER — Other Ambulatory Visit: Payer: Self-pay | Admitting: *Deleted

## 2023-05-28 ENCOUNTER — Other Ambulatory Visit: Payer: Self-pay

## 2023-05-28 ENCOUNTER — Ambulatory Visit (INDEPENDENT_AMBULATORY_CARE_PROVIDER_SITE_OTHER): Payer: Medicare Other | Admitting: Allergy & Immunology

## 2023-05-28 ENCOUNTER — Encounter: Payer: Self-pay | Admitting: Allergy & Immunology

## 2023-05-28 ENCOUNTER — Encounter: Payer: Self-pay | Admitting: Hematology and Oncology

## 2023-05-28 ENCOUNTER — Ambulatory Visit: Payer: Medicare Other | Admitting: Allergy & Immunology

## 2023-05-28 ENCOUNTER — Ambulatory Visit
Admission: RE | Admit: 2023-05-28 | Discharge: 2023-05-28 | Disposition: A | Discharge status: Home / Self Care | Payer: Medicare Other | Source: Ambulatory Visit | Attending: Allergy & Immunology | Admitting: Allergy & Immunology

## 2023-05-28 VITALS — BP 118/60 | HR 85 | Temp 98.3°F | Resp 18

## 2023-05-28 DIAGNOSIS — R079 Chest pain, unspecified: Secondary | ICD-10-CM | POA: Diagnosis not present

## 2023-05-28 DIAGNOSIS — J4540 Moderate persistent asthma, uncomplicated: Secondary | ICD-10-CM | POA: Diagnosis not present

## 2023-05-28 DIAGNOSIS — J302 Other seasonal allergic rhinitis: Secondary | ICD-10-CM

## 2023-05-28 DIAGNOSIS — J3089 Other allergic rhinitis: Secondary | ICD-10-CM | POA: Diagnosis not present

## 2023-05-28 DIAGNOSIS — H1010 Acute atopic conjunctivitis, unspecified eye: Secondary | ICD-10-CM

## 2023-05-28 DIAGNOSIS — R0602 Shortness of breath: Secondary | ICD-10-CM

## 2023-05-28 LAB — D-DIMER, QUANTITATIVE: D-DIMER: 0.71 mg{FEU}/L — ABNORMAL HIGH (ref 0.00–0.49)

## 2023-05-28 MED ORDER — PREDNISONE 10 MG PO TABS: ORAL_TABLET | ORAL | 0 refills | Status: DC

## 2023-05-28 MED ORDER — MONTELUKAST SODIUM 10 MG PO TABS: ORAL_TABLET | ORAL | 1 refills | Status: DC

## 2023-05-28 MED ORDER — BREZTRI AEROSPHERE 160-9-4.8 MCG/ACT IN AERO: 2.0000 | INHALATION_SPRAY | Freq: Two times a day (BID) | RESPIRATORY_TRACT | 5 refills | Status: AC

## 2023-05-28 NOTE — Patient Instructions (Addendum)
1. Moderate persistent asthma, uncomplicated - Lung testing not done today. - We are going to get a D-dimer to make sure that you do not have a pulmonary embolism - I would like to get a chest X-ray to make sure that you do not have a pneumonia.  - If this is normal, I am going to have you start a prednisone burst (low dose burst).  - Sample of Breztri provided.  - Daily controller medication(s): Singulair 10mg  daily and Breztri 160/4.61mcg two puffs twice daily with spacer - Prior to physical activity: albuterol 2 puffs 10-15 minutes before physical activity. - Rescue medications: albuterol 4 puffs every 4-6 hours as needed - Asthma control goals:  * Full participation in all desired activities (may need albuterol before activity) * Albuterol use two time or less a week on average (not counting use with activity) * Cough interfering with sleep two time or less a month * Oral steroids no more than once a year * No hospitalizations  2. Seasonal and perennial allergic rhinitis - Continue with Claritin or Allegra once daily. - Continue with Singulair (montelukast) 10mg  daily - Continue with Dymista (fluticasone/azelastine) two sprays per nostril twice daily as needed.   3. Return in about 3 months (around 08/26/2023). You can have the follow up appointment with Dr. Dellis Anes or a Nurse Practicioner (our Nurse Practitioners are excellent and always have Physician oversight!).    Please inform us of any Emergency Department visits, hospitalizations, or changes in symptoms. Call us before going to the ED for breathing or allergy symptoms since we might be able to fit you in for a sick visit. Feel free to contact us anytime with any questions, problems, or concerns.  It was a pleasure to see you again today!  Websites that have reliable patient information: 1. American Academy of Asthma, Allergy, and Immunology: www.aaaai.org 2. Food Allergy Research and Education (FARE): foodallergy.org 3.  Mothers of Asthmatics: http://www.asthmacommunitynetwork.org 4. American College of Allergy, Asthma, and Immunology: www.acaai.org      "Like" [place] on Facebook and Instagram for our latest updates!      A healthy democracy works best when [employer] participate! Make sure you are registered to vote! If you have moved or changed any of your contact information, you will need to get this updated before voting! Scan the QR codes below to learn more!

## 2023-05-28 NOTE — Telephone Encounter (Signed)
I saw her this morning! Thanks!   Malachi Bonds, MD [facility]

## 2023-05-28 NOTE — Progress Notes (Signed)
FOLLOW UP  Date of Service/Encounter:  05/28/23   Assessment:   Moderate persistent asthma, uncomplicated - previously on Xolair, but now stable without it   Seasonal perennial allergic rhinitis   Tinnitus - followed by Dr. Marene Lenz     Complicated past medical history including psoriatic arthritis as well as diverticulitis - on Simponi   Adverse reaction to her chemotherapy (decitabine) - with improvement with the triamcinolone and prednisone   Shortness of breath - with mildly elevated D-dimer at 0.71    Plan/Recommendations:   1. Moderate persistent asthma, uncomplicated - Lung testing not done today. - We are going to get a D-dimer to make sure that you do not have a pulmonary embolism - I would like to get a chest X-ray to make sure that you do not have a pneumonia.  - If this is normal, I am going to have you start a prednisone burst (low dose burst).  - Sample of Breztri provided.  - Daily controller medication(s): Singulair 10mg  daily and Breztri 160/4.65mcg two puffs twice daily with spacer - Prior to physical activity: albuterol 2 puffs 10-15 minutes before physical activity. - Rescue medications: albuterol 4 puffs every 4-6 hours as needed - Asthma control goals:  * Full participation in all desired activities (may need albuterol before activity) * Albuterol use two time or less a week on average (not counting use with activity) * Cough interfering with sleep two time or less a month * Oral steroids no more than once a year * No hospitalizations  2. Seasonal and perennial allergic rhinitis - Continue with Claritin or Allegra once daily. - Continue with Singulair (montelukast) 10mg  daily - Continue with Dymista (fluticasone/azelastine) two sprays per nostril twice daily as needed.   3. Elevated D-dimer - I discussed with the patient and she does have a couple of risk factors for a falsely elevated number including arthritis as well as cancer. - It is not  very far outside of the normal range. - She does not want to go to the emergency room. - I recommended that she start the prednisone and we will call her tomorrow to see how she is feeling. - Patient in agreement with the plan.  4. Return in about 3 months (around 08/26/2023). You can have the follow up appointment with Dr. Dellis Anes or a Nurse Practicioner (our Nurse Practitioners are excellent and always have Physician oversight!).   Subjective:   [name] [name] is a 77 y.o. female presenting today for follow up of  Chief Complaint  Patient presents with   Asthma   Cough   Chest Pain    [name] [name] has a history of the following: 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) 04/26/2023   Hypokalemia 04/26/2023   Neutropenic fever (HCC) 04/25/2023   Hives 03/18/2023   MDS (myelodysplastic syndrome), high grade (HCC) 01/29/2023   Obesity 03/02/2021   Other long term (current) drug therapy 03/02/2021   Primary osteoarthritis 03/02/2021   SOB (shortness of breath) 12/08/2020   Pericardial effusion 12/08/2020   Dry eyes/dry mouth 05/25/2019   Bronchitis, mucopurulent recurrent (HCC) 02/26/2019   Moderate persistent asthma without complication 02/24/2019   Perennial and seasonal allergic rhinitis 02/24/2019   Seasonal allergic conjunctivitis 02/24/2019   Primary osteoarthritis of both knees 04/10/2016   Lung nodule 08/19/2014   HTN (hypertension) 08/07/2012   HLD (hyperlipidemia) 09/26/2011   Asthma 09/06/2010   Depression  09/06/2010   History of allergy 09/06/2010   Hypothyroidism 09/06/2010   Lower back pain 09/06/2010   Arthritis 09/06/2010   Pain in joint, pelvic region and thigh 09/06/2010   Peripheral neuropathy 09/06/2010   Psoriasis 09/06/2010   Therapeutic drug monitoring 09/06/2010   Tinnitus 09/06/2010   Vitamin B 12 deficiency 09/06/2010   Cervical spondylosis without  myelopathy 04/24/2006   Tear of lateral cartilage or meniscus of knee, current 03/13/2006    History obtained from: chart review and patient.  Discussed the use of AI scribe software for clinical note transcription with the patient and/or guardian, who gave verbal consent to proceed.  [name] [name] is a 77 y.o. female presenting for a sick visit.  She was last seen in September 2020 for by one of our nurse practitioners.  At that time, he was encouraged to restart the Breztri 2 puffs twice daily as well as albuterol as needed.  For her allergic rhinitis, she was continued on Flonase as well as an antihistamine.  She was also started on Pataday.  She was experiencing some hives as well secondary to starting her chemotherapy for myelodysplastic syndrome.  Since last visit, she has done largely well.    She reports that she has developed a recent sensation of shortness of breath. The patient reports that the dyspnea is activity-related and is associated with discomfort on the right side of the chest, which is exacerbated during both inhalation and exhalation. The patient denies any recent fever and has not undergone any recent chest imaging or breathing tests. The patient has no history of blood clots but reports a past experience of pleurisy, which the current symptoms seem to resemble.  She denies any history of blood clots at all.  This sensation has not really improved with the albuterol use, although she is not using it frequently.  The patient is currently on a chemotherapy regimen, receiving infusions every 28 days for a week at a time. The last infusion was reported to be due to start again on the upcoming Friday. The patient is unsure of the total number of chemotherapy courses required. The patient's history of thrombocytopenia was identified as an early manifestation of the current MDS, diagnosed following a bone marrow biopsy.  She has discussed treatment options including a bone marrow transplant,  but she does not want to proceed with this.  She has been given 12 to 18 months, but she is going to continue with chemotherapy. The patient has decided against a bone marrow transplant due to the significant impact on her quality of life.   The patient also reports a history of psoriatic arthritis, managed with Simponi infusions. The patient denies any adverse reactions to the Simponi infusions. The patient's MDS and psoriatic arthritis are not believed to be related.  The patient has recently moved to a new residence and has been managing her asthma symptoms with samples of Breztri. The patient reports liking the [place].  She is wondering if we can send in a prescription.  She would like another sample if we have 1. The patient also reports using a fluticasone/azelastine nasal spray, especially during pollen season.  Otherwise, there have been no changes to her past medical history, surgical history, family history, or social history.    Review of systems otherwise negative other than that mentioned in the HPI.    Objective:   Blood pressure 118/60, pulse 85, temperature 98.3 F (36.8 C), temperature source Temporal, resp. rate 18, SpO2 98%. There is no  height or weight on file to calculate BMI.    Physical Exam Vitals reviewed.  Constitutional:      Appearance: She is well-developed.     Comments: Pleasant.  Smiling.  HENT:     Head: Normocephalic and atraumatic.     Right Ear: Tympanic membrane, ear canal and external ear normal.     Left Ear: Tympanic membrane, ear canal and external ear normal.     Nose: No nasal deformity, septal deviation, mucosal edema or rhinorrhea.     Right Turbinates: Enlarged, swollen and pale.     Left Turbinates: Enlarged, swollen and pale.     Right Sinus: No maxillary sinus tenderness or frontal sinus tenderness.     Left Sinus: No maxillary sinus tenderness or frontal sinus tenderness.     Mouth/Throat:     Lips: Pink.     Mouth: Mucous  membranes are moist. Mucous membranes are not pale and not dry.     Pharynx: Uvula midline.     Comments: Cobblestoning in the posterior oropharynx.  Eyes:     General: Lids are normal. No allergic shiner.       Right eye: No discharge.        Left eye: No discharge.     Conjunctiva/sclera: Conjunctivae normal.     Right eye: Right conjunctiva is not injected. No chemosis.    Left eye: Left conjunctiva is not injected. No chemosis.    Pupils: Pupils are equal, round, and reactive to light.  Cardiovascular:     Rate and Rhythm: Normal rate and regular rhythm.     Heart sounds: Normal heart sounds.  Pulmonary:     Effort: Pulmonary effort is normal. No tachypnea, accessory muscle usage or respiratory distress.     Breath sounds: Normal breath sounds. No wheezing, rhonchi or rales.     Comments: Moving air well in all lung fields. No increased work of breathing noted.  She does have tenderness to palpation over the anterior right side of her chest. Chest:     Chest wall: No tenderness.  Lymphadenopathy:     Cervical: No cervical adenopathy.  Skin:    General: Skin is warm.     Capillary Refill: Capillary refill takes less than 2 seconds.     Coloration: Skin is not pale.     Findings: No abrasion, erythema, petechiae or rash. Rash is not papular, urticarial or vesicular.     Comments: No eczematous or urticarial lesions noted.   Neurological:     Mental Status: She is alert.  Psychiatric:        Behavior: Behavior is cooperative.      Diagnostic studies:  stat D-dimer sent (elevated  slightly at 0.71)  We did order a chest x-ray as well that showed no evidence of acute cardiopulmonary process.  CLINICAL DATA:  Shortness of breath. Patient undergoing chemotherapy for myelodysplastic syndrome.   EXAM: CHEST - 2 VIEW   COMPARISON:  Radiographs 10/10/2006.  Chest CT 11/29/2020.   FINDINGS: Right IJ Port-A-Cath extends to the mid SVC level. The heart size and mediastinal  contours are stable with aortic atherosclerosis. The lungs appear clear. There is no pleural effusion or pneumothorax. Exaggerated thoracic kyphosis and multilevel spondylosis appear unchanged.   IMPRESSION: No evidence of acute cardiopulmonary process. Stable chest.     Electronically Signed   By: Carey Bullocks M.D.   On: 05/28/2023 14:41         Malachi Bonds, MD  [facility]

## 2023-05-29 ENCOUNTER — Other Ambulatory Visit: Payer: Self-pay

## 2023-05-29 ENCOUNTER — Telehealth: Payer: Self-pay | Admitting: *Deleted

## 2023-05-29 ENCOUNTER — Ambulatory Visit: Payer: Medicare Other

## 2023-05-29 NOTE — Telephone Encounter (Signed)
Attempted to call pt on 12/10- no answer and per chart review noted pt was seen at her Asthma and Allergy office at this time.  Per follow up on 12/11- noted pt was prescribed prednisone with noted improvement.  Pt is scheduled for lab and visit tomorrow with this office.

## 2023-05-29 NOTE — Telephone Encounter (Signed)
-----   Message from Alfonse Spruce sent at 05/28/2023  3:13 PM EST ----- Can someone call the patient tomorrow to see how her shortness of breath is faring?

## 2023-05-29 NOTE — Telephone Encounter (Signed)
Thank you :)

## 2023-05-29 NOTE — Telephone Encounter (Signed)
Called and spoke with the patient, she stated that she is feeling some better and that her chest is no longer sore. I did advise to call if she needed anything. Patient verbalized understanding.

## 2023-05-30 ENCOUNTER — Ambulatory Visit: Payer: Medicare Other

## 2023-05-30 ENCOUNTER — Ambulatory Visit: Payer: Medicare Other | Admitting: Allergy & Immunology

## 2023-05-31 ENCOUNTER — Telehealth: Payer: Self-pay | Admitting: *Deleted

## 2023-05-31 ENCOUNTER — Inpatient Hospital Stay: Payer: Medicare Other

## 2023-05-31 ENCOUNTER — Ambulatory Visit: Payer: Medicare Other

## 2023-05-31 ENCOUNTER — Other Ambulatory Visit: Payer: Self-pay | Admitting: *Deleted

## 2023-05-31 ENCOUNTER — Inpatient Hospital Stay: Payer: Medicare Other | Attending: Hematology and Oncology

## 2023-05-31 ENCOUNTER — Encounter: Payer: Self-pay | Admitting: Adult Health

## 2023-05-31 ENCOUNTER — Inpatient Hospital Stay (HOSPITAL_BASED_OUTPATIENT_CLINIC_OR_DEPARTMENT_OTHER): Payer: Medicare Other | Attending: Hematology and Oncology | Admitting: Adult Health

## 2023-05-31 VITALS — BP 129/61 | HR 87 | Temp 97.7°F | Resp 16 | Wt 189.4 lb

## 2023-05-31 DIAGNOSIS — Z79899 Other long term (current) drug therapy: Secondary | ICD-10-CM | POA: Diagnosis not present

## 2023-05-31 DIAGNOSIS — Z840 Family history of diseases of the skin and subcutaneous tissue: Secondary | ICD-10-CM | POA: Insufficient documentation

## 2023-05-31 DIAGNOSIS — D469 Myelodysplastic syndrome, unspecified: Secondary | ICD-10-CM | POA: Diagnosis present

## 2023-05-31 DIAGNOSIS — Z87891 Personal history of nicotine dependence: Secondary | ICD-10-CM | POA: Insufficient documentation

## 2023-05-31 DIAGNOSIS — D46Z Other myelodysplastic syndromes: Secondary | ICD-10-CM

## 2023-05-31 DIAGNOSIS — D696 Thrombocytopenia, unspecified: Secondary | ICD-10-CM

## 2023-05-31 DIAGNOSIS — Z5111 Encounter for antineoplastic chemotherapy: Secondary | ICD-10-CM | POA: Insufficient documentation

## 2023-05-31 DIAGNOSIS — Z91030 Bee allergy status: Secondary | ICD-10-CM | POA: Insufficient documentation

## 2023-05-31 DIAGNOSIS — T451X5A Adverse effect of antineoplastic and immunosuppressive drugs, initial encounter: Secondary | ICD-10-CM | POA: Diagnosis not present

## 2023-05-31 DIAGNOSIS — R0602 Shortness of breath: Secondary | ICD-10-CM

## 2023-05-31 DIAGNOSIS — M25473 Effusion, unspecified ankle: Secondary | ICD-10-CM | POA: Insufficient documentation

## 2023-05-31 DIAGNOSIS — Z9089 Acquired absence of other organs: Secondary | ICD-10-CM | POA: Diagnosis not present

## 2023-05-31 DIAGNOSIS — E876 Hypokalemia: Secondary | ICD-10-CM | POA: Insufficient documentation

## 2023-05-31 DIAGNOSIS — Z9851 Tubal ligation status: Secondary | ICD-10-CM | POA: Insufficient documentation

## 2023-05-31 DIAGNOSIS — Z825 Family history of asthma and other chronic lower respiratory diseases: Secondary | ICD-10-CM | POA: Insufficient documentation

## 2023-05-31 DIAGNOSIS — E669 Obesity, unspecified: Secondary | ICD-10-CM | POA: Insufficient documentation

## 2023-05-31 DIAGNOSIS — Z818 Family history of other mental and behavioral disorders: Secondary | ICD-10-CM | POA: Diagnosis not present

## 2023-05-31 DIAGNOSIS — Z8261 Family history of arthritis: Secondary | ICD-10-CM | POA: Insufficient documentation

## 2023-05-31 DIAGNOSIS — D709 Neutropenia, unspecified: Secondary | ICD-10-CM | POA: Diagnosis not present

## 2023-05-31 DIAGNOSIS — R1310 Dysphagia, unspecified: Secondary | ICD-10-CM | POA: Insufficient documentation

## 2023-05-31 DIAGNOSIS — J4540 Moderate persistent asthma, uncomplicated: Secondary | ICD-10-CM | POA: Insufficient documentation

## 2023-05-31 DIAGNOSIS — D61810 Antineoplastic chemotherapy induced pancytopenia: Secondary | ICD-10-CM | POA: Insufficient documentation

## 2023-05-31 DIAGNOSIS — R5081 Fever presenting with conditions classified elsewhere: Secondary | ICD-10-CM | POA: Diagnosis not present

## 2023-05-31 DIAGNOSIS — R5383 Other fatigue: Secondary | ICD-10-CM | POA: Insufficient documentation

## 2023-05-31 LAB — CBC WITH DIFFERENTIAL (CANCER CENTER ONLY)
Abs Immature Granulocytes: 0.01 10*3/uL (ref 0.00–0.07)
Basophils Absolute: 0 10*3/uL (ref 0.0–0.1)
Basophils Relative: 0 %
Eosinophils Absolute: 0 10*3/uL (ref 0.0–0.5)
Eosinophils Relative: 1 %
HCT: 21.7 % — ABNORMAL LOW (ref 36.0–46.0)
Hemoglobin: 7.1 g/dL — ABNORMAL LOW (ref 12.0–15.0)
Immature Granulocytes: 1 %
Lymphocytes Relative: 89 %
Lymphs Abs: 1.9 10*3/uL (ref 0.7–4.0)
MCH: 35.9 pg — ABNORMAL HIGH (ref 26.0–34.0)
MCHC: 32.7 g/dL (ref 30.0–36.0)
MCV: 109.6 fL — ABNORMAL HIGH (ref 80.0–100.0)
Monocytes Absolute: 0.1 10*3/uL (ref 0.1–1.0)
Monocytes Relative: 7 %
Neutro Abs: 0.1 10*3/uL — CL (ref 1.7–7.7)
Neutrophils Relative %: 2 %
Platelet Count: 103 10*3/uL — ABNORMAL LOW (ref 150–400)
RBC: 1.98 MIL/uL — ABNORMAL LOW (ref 3.87–5.11)
RDW: 28.7 % — ABNORMAL HIGH (ref 11.5–15.5)
Smear Review: NORMAL
WBC Count: 2.1 10*3/uL — ABNORMAL LOW (ref 4.0–10.5)
nRBC: 1.9 % — ABNORMAL HIGH (ref 0.0–0.2)

## 2023-05-31 LAB — CMP (CANCER CENTER ONLY)
ALT: 6 U/L (ref 0–44)
AST: 12 U/L — ABNORMAL LOW (ref 15–41)
Albumin: 3.6 g/dL (ref 3.5–5.0)
Alkaline Phosphatase: 51 U/L (ref 38–126)
Anion gap: 3 — ABNORMAL LOW (ref 5–15)
BUN: 11 mg/dL (ref 8–23)
CO2: 28 mmol/L (ref 22–32)
Calcium: 8.7 mg/dL — ABNORMAL LOW (ref 8.9–10.3)
Chloride: 107 mmol/L (ref 98–111)
Creatinine: 0.37 mg/dL — ABNORMAL LOW (ref 0.44–1.00)
GFR, Estimated: >60 mL/min (ref >60)
Glucose, Bld: 85 mg/dL (ref 70–99)
Potassium: 3.9 mmol/L (ref 3.5–5.1)
Sodium: 138 mmol/L (ref 135–145)
Total Bilirubin: 0.4 mg/dL (ref <1.2)
Total Protein: 6.8 g/dL (ref 6.5–8.1)

## 2023-05-31 LAB — PREPARE RBC (CROSSMATCH)

## 2023-05-31 MED ORDER — SODIUM CHLORIDE 0.9 % IV SOLN: Freq: Once | INTRAVENOUS | Status: AC

## 2023-05-31 MED ORDER — PROCHLORPERAZINE MALEATE 10 MG PO TABS
10.0000 mg | ORAL_TABLET | Freq: Once | ORAL | Status: AC
  Administered 2023-05-31: 10 mg via ORAL
  Filled 2023-05-31: qty 1

## 2023-05-31 MED ORDER — SODIUM CHLORIDE 0.9 % IV SOLN
20.0000 mg/m2 | Freq: Once | INTRAVENOUS | Status: AC
  Administered 2023-05-31: 40 mg via INTRAVENOUS
  Filled 2023-05-31: qty 8

## 2023-05-31 MED ORDER — SODIUM CHLORIDE 0.9% FLUSH
10.0000 mL | Freq: Once | INTRAVENOUS | Status: AC
  Administered 2023-05-31: 10 mL

## 2023-05-31 MED ORDER — SODIUM CHLORIDE 0.9% FLUSH
10.0000 mL | INTRAVENOUS | Status: DC | PRN
  Administered 2023-05-31: 10 mL

## 2023-05-31 MED ORDER — HEPARIN SOD (PORK) LOCK FLUSH 100 UNIT/ML IV SOLN
500.0000 [IU] | Freq: Once | INTRAVENOUS | Status: AC | PRN
  Administered 2023-05-31: 500 [IU]

## 2023-05-31 NOTE — Progress Notes (Addendum)
Per. Cherie Ouch, NP okay for patient to receive treatment today with ANC 0.1 and hemoglobin 7.1. Type and screen drawn for patient to receive blood tomorrow.

## 2023-05-31 NOTE — Progress Notes (Unsigned)
[facility] Cancer Follow up:    [name], [name], MD [address] [place] [address]   DIAGNOSIS: MDS  SUMMARY OF ONCOLOGIC HISTORY: Oncology History  MDS (myelodysplastic syndrome), high grade (HCC)  01/07/2023 Initial Biopsy   Bone marrow biopsy:cellular bone marrow with focal areas of hypercellularity (approximately up to  50%).  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  Blasts appear mildly mildly increased, approximately 5% to focally up to 10%.  Flow cytometric analysis reveals 5% CD34 positive blasts.  While the findings could be attributable to the patient's coexisting conditions, the possibility of a myeloid neoplasm such as myelodysplastic neoplasm with increased blasts cannot be entirely excluded. normal karyotype   01/29/2023 Initial Diagnosis   MDS (myelodysplastic syndrome), high grade (HCC)   02/04/2023 - 02/06/2023 Chemotherapy   Patient is on Treatment Plan : MYELODYSPLASIA  Azacitidine IV D1-5 q28d     03/04/2023 -  Chemotherapy   Patient is on Treatment Plan : MYELODYSPLASIA Decitabine D1-5 q28d       CURRENT THERAPY: dAcogen  INTERVAL HISTORY: [name] [name] 77 y.o. female with a history of myelodysplastic syndrome (MDS), presents with shortness of breath and chest discomfort. The discomfort is described as a burning sensation upon inhalation, with increased discomfort when leaning back. The patient also reports becoming breathless after walking short distances. She denies any fever but does report a morning cough with clear expectoration.  The patient has recently seen an asthma specialist, who performed a chest x-ray and a D-dimer test. The chest x-ray was reported as clear, and the D-dimer was slightly elevated. The patient also reports some ankle swelling and has been wearing knee-high support hose to manage this.  The patient's recent lab results show a drop in hemoglobin to 7.1, the lowest it has been, and an increase in  platelets to 103. The patient has been prescribed fluconazole, but it is unclear if this is a current medication. The patient has also been prescribed a short course of prednisone by another provider.  The patient lives independently and has been dealing with a significant home repair issue, including a gas leak and carbon monoxide exposure. Despite these stressors, the patient appears to be managing well.   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) 04/26/2023   Hypokalemia 04/26/2023   Neutropenic fever (HCC) 04/25/2023   Hives 03/18/2023   MDS (myelodysplastic syndrome), high grade (HCC) 01/29/2023   Obesity 03/02/2021   Other long term (current) drug therapy 03/02/2021   Primary osteoarthritis 03/02/2021   SOB (shortness of breath) 12/08/2020   Pericardial effusion 12/08/2020   Dry eyes/dry mouth 05/25/2019   Bronchitis, mucopurulent recurrent (HCC) 02/26/2019   Moderate persistent asthma without complication 02/24/2019   Perennial and seasonal allergic rhinitis 02/24/2019   Seasonal allergic conjunctivitis 02/24/2019   Primary osteoarthritis of both knees 04/10/2016   Lung nodule 08/19/2014   HTN (hypertension) 08/07/2012   HLD (hyperlipidemia) 09/26/2011   Asthma 09/06/2010   Depression 09/06/2010   History of allergy 09/06/2010   Hypothyroidism 09/06/2010   Lower back pain 09/06/2010   Arthritis 09/06/2010   Pain in joint, pelvic region and thigh 09/06/2010   Peripheral neuropathy 09/06/2010   Psoriasis 09/06/2010   Therapeutic drug monitoring 09/06/2010   Tinnitus 09/06/2010   Vitamin B 12 deficiency 09/06/2010   Cervical spondylosis without myelopathy 04/24/2006   Tear of lateral cartilage or meniscus of knee, current 03/13/2006  is allergic to bee pollen, latex, pollen extract-tree extract [pollen extract], cat hair extract, dust mite extract, and molds & smuts.  MEDICAL HISTORY: Past  Medical History:  Diagnosis Date   Asthma    Diverticulitis    Hypothyroid    MDS (myelodysplastic syndrome) (HCC) 01/2023   Psoriatic arthritis (HCC)     SURGICAL HISTORY: Past Surgical History:  Procedure Laterality Date   COLONOSCOPY  08/2021   HIP SURGERY Bilateral    IR IMAGING GUIDED PORT INSERTION  02/20/2023   TONSILLECTOMY     TUBAL LIGATION      SOCIAL HISTORY: Social History   Socioeconomic History   Marital status: Widowed    Spouse name: Not on file   Number of children: Not on file   Years of education: Not on file   Highest education level: Not on file  Occupational History   Not on file  Tobacco Use   Smoking status: Former    Current packs/day: 0.00    Average packs/day: 0.1 packs/day for 33.4 years (3.3 ttl pk-yrs)    Types: Cigarettes    Start date: 51    Quit date: 11/18/2000    Years since quitting: 22.5   Smokeless tobacco: Never   Tobacco comments:    3 cigarettes a day  Vaping Use   Vaping status: Never Used  Substance and Sexual Activity   Alcohol use: Never   Drug use: Never   Sexual activity: Not Currently  Other Topics Concern   Not on file  Social History Narrative   Lives with children   Social Drivers of Health   Financial Resource Strain: Not on file  Food Insecurity: No Food Insecurity (04/26/2023)   Hunger Vital Sign    Worried About Running Out of Food in the Last Year: Never true    Ran Out of Food in the Last Year: Never true  Transportation Needs: No Transportation Needs (04/26/2023)   PRAPARE - Administrator, Civil Service (Medical): No    Lack of Transportation (Non-Medical): No  Physical Activity: Not on file  Stress: Not on file  Social Connections: Not on file  Intimate Partner Violence: Not At Risk (04/26/2023)   Humiliation, Afraid, Rape, and Kick questionnaire    Fear of Current or Ex-Partner: No    Emotionally Abused: No    Physically Abused: No    Sexually Abused: No    FAMILY  HISTORY: Family History  Problem Relation Age of Onset   Asthma Mother    Allergic rhinitis Mother    Rheum arthritis Mother    Allergic rhinitis Father    Alzheimer's disease Father    Eczema Brother    Allergic rhinitis Brother    Colon cancer Neg Hx    Esophageal cancer Neg Hx    Stomach cancer Neg Hx    Rectal cancer Neg Hx     Review of Systems  Constitutional:  Positive for fatigue. Negative for appetite change, chills, fever and unexpected weight change.  HENT:   Negative for hearing loss, lump/mass and trouble swallowing.   Eyes:  Negative for eye problems and icterus.  Respiratory:  Positive for shortness of breath. Negative for chest tightness, cough and wheezing.   Cardiovascular:  Negative for chest pain, leg swelling and palpitations.  Gastrointestinal:  Negative for abdominal distention, abdominal pain, constipation, diarrhea, nausea and vomiting.  Endocrine: Negative for hot flashes.  Genitourinary:  Negative for difficulty urinating.   Musculoskeletal:  Negative for arthralgias.  Skin:  Negative for itching and rash.  Neurological:  Negative for dizziness, extremity weakness, headaches and numbness.  Hematological:  Negative for adenopathy. Does not bruise/bleed easily.  Psychiatric/Behavioral:  Negative for depression. The patient is not nervous/anxious.       PHYSICAL EXAMINATION     Vitals:   05/31/23 0919  BP: 129/61  Pulse: 87  Resp: 16  Temp: 97.7 F (36.5 C)  SpO2: 99%    Physical Exam  LABORATORY DATA:  CBC    Component Value Date/Time   WBC 1.1 (L) 06/03/2023 1237   RBC 2.70 (L) 06/03/2023 1237   HGB 9.3 (L) 06/03/2023 1237   HGB 7.1 (L) 05/31/2023 0858   HCT 28.3 (L) 06/03/2023 1237   HCT 39.0 11/24/2022 1035   PLT 89 (L) 06/03/2023 1237   PLT 103 (L) 05/31/2023 0858   MCV 104.8 (H) 06/03/2023 1237   MCH 34.4 (H) 06/03/2023 1237   MCHC 32.9 06/03/2023 1237   RDW 28.2 (H) 06/03/2023 1237   LYMPHSABS 0.7 06/03/2023 1237    MONOABS 0.2 06/03/2023 1237   EOSABS 0.0 06/03/2023 1237   BASOSABS 0.0 06/03/2023 1237    CMP     Component Value Date/Time   NA 138 05/31/2023 0858   K 3.9 05/31/2023 0858   CL 107 05/31/2023 0858   CO2 28 05/31/2023 0858   GLUCOSE 85 05/31/2023 0858   BUN 11 05/31/2023 0858   CREATININE 0.37 (L) 05/31/2023 0858   CALCIUM 8.7 (L) 05/31/2023 0858   PROT 6.8 05/31/2023 0858   ALBUMIN 3.6 05/31/2023 0858   AST 12 (L) 05/31/2023 0858   ALT 6 05/31/2023 0858   ALKPHOS 51 05/31/2023 0858   BILITOT 0.4 05/31/2023 0858   GFRNONAA >60 05/31/2023 0858         ASSESSMENT and THERAPY PLAN:   MDS (myelodysplastic syndrome), high grade (HCC) MDS--on treatment with Decitabine  Anemia Hemoglobin decreased to 7.1, likely contributing to shortness of breath and discomfort on inspiration. No history of heart issues. -Transfuse 2 units of blood on 06/01/2023. -Check hemoglobin levels on 06/03/2023.  Shortness of Breath Mild discomfort on inspiration and shortness of breath on exertion. Chest X-ray clear. D-dimer slightly elevated. No fever or productive cough. -If shortness of breath worsens despite blood transfusion, proceed to emergency room for CT chest and further evaluation.  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 -Consult with pharmacist regarding potential need for preventative medications against specific types of pneumonia or fungal infections.  General Health Maintenance -Continue Prednisone 20mg  daily for 10-day course as prescribed by Dr. Dellis Anes. -Review medication list with pharmacist for potential adjustments. -Follow-up appointment on 06/03/2023.  All questions were answered. The patient knows to call the clinic with any problems, questions or concerns. We can certainly see the patient much sooner if necessary.  Total encounter time:30 minutes*in face-to-face visit time, chart review, lab review, care coordination, order  entry, and documentation of the encounter time.    Lillard Anes, NP 06/03/23 1:07 PM Medical Oncology and Hematology [facility] [address] [place], [place] [address] Tel. [phone]    Fax. [phone]  *Total Encounter Time as defined by the Centers for Medicare and Medicaid Services includes, in addition to the face-to-face time of a patient visit (documented in the note above) non-face-to-face time: obtaining and reviewing outside history, ordering and reviewing medications, tests or procedures, care coordination (communications with other health care professionals or caregivers) and documentation in the medical  record.

## 2023-05-31 NOTE — Patient Instructions (Signed)
 [facility] - A DEPT OF [facility]  Discharge Instructions: Thank you for choosing [facility] to provide your oncology and hematology care.   If you have a lab appointment with the Cancer Center, please go directly to the Cancer Center and check in at the registration area.   Wear comfortable clothing and clothing appropriate for easy access to any Portacath or PICC line.   We strive to give you quality time with your provider. You may need to reschedule your appointment if you arrive late (15 or more minutes).  Arriving late affects you and other patients whose appointments are after yours.  Also, if you miss three or more appointments without notifying the office, you may be dismissed from the clinic at the provider's discretion.      For prescription refill requests, have your pharmacy contact our office and allow 72 hours for refills to be completed.    Today you received the following chemotherapy and/or immunotherapy agents Dacogen      To help prevent nausea and vomiting after your treatment, we encourage you to take your nausea medication as directed.  BELOW ARE SYMPTOMS THAT SHOULD BE REPORTED IMMEDIATELY: *FEVER GREATER THAN 100.4 F (38 C) OR HIGHER *CHILLS OR SWEATING *NAUSEA AND VOMITING THAT IS NOT CONTROLLED WITH YOUR NAUSEA MEDICATION *UNUSUAL SHORTNESS OF BREATH *UNUSUAL BRUISING OR BLEEDING *URINARY PROBLEMS (pain or burning when urinating, or frequent urination) *BOWEL PROBLEMS (unusual diarrhea, constipation, pain near the anus) TENDERNESS IN MOUTH AND THROAT WITH OR WITHOUT PRESENCE OF ULCERS (sore throat, sores in mouth, or a toothache) UNUSUAL RASH, SWELLING OR PAIN  UNUSUAL VAGINAL DISCHARGE OR ITCHING   Items with * indicate a potential emergency and should be followed up as soon as possible or go to the Emergency Department if any problems should occur.  Please show the CHEMOTHERAPY ALERT CARD or IMMUNOTHERAPY  ALERT CARD at check-in to the Emergency Department and triage nurse.  Should you have questions after your visit or need to cancel or reschedule your appointment, please contact [facility] - A DEPT OF [facility]  Dept: [phone]  and follow the prompts.  Office hours are 8:00 a.m. to 4:30 p.m. Monday - Friday. Please note that voicemails left after 4:00 p.m. may not be returned until the following business day.  We are closed weekends and major holidays. You have access to a nurse at all times for urgent questions. Please call the main number to the clinic Dept: [phone] and follow the prompts.   For any non-urgent questions, you may also contact your provider using MyChart. We now offer e-Visits for anyone 23 and older to request care online for non-urgent symptoms. For details visit [email or web address].   Also download the MyChart app! Go to the app store, search "MyChart", open the app, select [facility], and log in with your MyChart username and password.

## 2023-05-31 NOTE — Telephone Encounter (Addendum)
Panic ANC level reported to provider seeing pt today along with update on pt's recent issue with heaviness in chest - seen by allergist earlier this week and put on prednisone post D dimer to r/o PE and CXR.

## 2023-06-01 ENCOUNTER — Inpatient Hospital Stay: Payer: Medicare Other

## 2023-06-01 VITALS — BP 118/65 | HR 63 | Temp 98.4°F | Resp 18

## 2023-06-01 DIAGNOSIS — Z5111 Encounter for antineoplastic chemotherapy: Secondary | ICD-10-CM | POA: Diagnosis not present

## 2023-06-01 DIAGNOSIS — D709 Neutropenia, unspecified: Secondary | ICD-10-CM

## 2023-06-01 DIAGNOSIS — R0602 Shortness of breath: Secondary | ICD-10-CM

## 2023-06-01 DIAGNOSIS — D46Z Other myelodysplastic syndromes: Secondary | ICD-10-CM

## 2023-06-01 DIAGNOSIS — D696 Thrombocytopenia, unspecified: Secondary | ICD-10-CM

## 2023-06-01 MED ORDER — HEPARIN SOD (PORK) LOCK FLUSH 100 UNIT/ML IV SOLN
250.0000 [IU] | INTRAVENOUS | Status: AC | PRN
  Administered 2023-06-01: 250 [IU]

## 2023-06-01 MED ORDER — FUROSEMIDE 10 MG/ML IJ SOLN
20.0000 mg | Freq: Once | INTRAMUSCULAR | Status: AC
  Administered 2023-06-01: 10 mg via INTRAVENOUS
  Filled 2023-06-01: qty 2

## 2023-06-01 MED ORDER — SODIUM CHLORIDE 0.9% FLUSH
10.0000 mL | INTRAVENOUS | Status: AC | PRN
  Administered 2023-06-01: 10 mL

## 2023-06-01 MED ORDER — ACETAMINOPHEN 325 MG PO TABS
650.0000 mg | ORAL_TABLET | Freq: Once | ORAL | Status: AC
  Administered 2023-06-01: 650 mg via ORAL
  Filled 2023-06-01: qty 2

## 2023-06-01 MED ORDER — DIPHENHYDRAMINE HCL 25 MG PO CAPS
25.0000 mg | ORAL_CAPSULE | Freq: Once | ORAL | Status: AC
  Administered 2023-06-01: 25 mg via ORAL
  Filled 2023-06-01: qty 1

## 2023-06-01 MED ORDER — SODIUM CHLORIDE 0.9% FLUSH
10.0000 mL | Freq: Once | INTRAVENOUS | Status: AC
  Administered 2023-06-01: 10 mL

## 2023-06-01 MED ORDER — SODIUM CHLORIDE 0.9% IV SOLUTION
250.0000 mL | INTRAVENOUS | Status: DC
  Administered 2023-06-01: 250 mL via INTRAVENOUS

## 2023-06-01 NOTE — Patient Instructions (Signed)

## 2023-06-03 ENCOUNTER — Inpatient Hospital Stay: Payer: Medicare Other

## 2023-06-03 ENCOUNTER — Ambulatory Visit (HOSPITAL_COMMUNITY)
Admission: RE | Admit: 2023-06-03 | Discharge: 2023-06-03 | Disposition: A | Discharge status: Home / Self Care | Payer: Medicare Other | Source: Ambulatory Visit | Attending: Adult Health | Admitting: Adult Health

## 2023-06-03 ENCOUNTER — Encounter (HOSPITAL_COMMUNITY): Payer: Self-pay

## 2023-06-03 ENCOUNTER — Other Ambulatory Visit: Payer: Self-pay | Admitting: *Deleted

## 2023-06-03 ENCOUNTER — Inpatient Hospital Stay (HOSPITAL_BASED_OUTPATIENT_CLINIC_OR_DEPARTMENT_OTHER): Payer: Medicare Other | Admitting: Adult Health

## 2023-06-03 ENCOUNTER — Encounter: Payer: Self-pay | Admitting: Adult Health

## 2023-06-03 ENCOUNTER — Other Ambulatory Visit: Payer: Self-pay | Admitting: Adult Health

## 2023-06-03 ENCOUNTER — Encounter: Payer: Self-pay | Admitting: Hematology and Oncology

## 2023-06-03 VITALS — BP 123/64 | HR 66 | Temp 98.0°F | Resp 18 | Wt 191.0 lb

## 2023-06-03 VITALS — BP 127/64 | HR 78 | Temp 98.2°F | Resp 16

## 2023-06-03 DIAGNOSIS — D46Z Other myelodysplastic syndromes: Secondary | ICD-10-CM

## 2023-06-03 DIAGNOSIS — R7989 Other specified abnormal findings of blood chemistry: Secondary | ICD-10-CM

## 2023-06-03 DIAGNOSIS — R0602 Shortness of breath: Secondary | ICD-10-CM | POA: Diagnosis present

## 2023-06-03 DIAGNOSIS — D709 Neutropenia, unspecified: Secondary | ICD-10-CM

## 2023-06-03 DIAGNOSIS — D696 Thrombocytopenia, unspecified: Secondary | ICD-10-CM

## 2023-06-03 DIAGNOSIS — Z5111 Encounter for antineoplastic chemotherapy: Secondary | ICD-10-CM | POA: Diagnosis not present

## 2023-06-03 LAB — CBC WITH DIFFERENTIAL/PLATELET
Abs Immature Granulocytes: 0.01 10*3/uL (ref 0.00–0.07)
Basophils Absolute: 0 10*3/uL (ref 0.0–0.1)
Basophils Relative: 0 %
Eosinophils Absolute: 0 10*3/uL (ref 0.0–0.5)
Eosinophils Relative: 1 %
HCT: 28.3 % — ABNORMAL LOW (ref 36.0–46.0)
Hemoglobin: 9.3 g/dL — ABNORMAL LOW (ref 12.0–15.0)
Immature Granulocytes: 1 %
Lymphocytes Relative: 57 %
Lymphs Abs: 0.7 10*3/uL (ref 0.7–4.0)
MCH: 34.4 pg — ABNORMAL HIGH (ref 26.0–34.0)
MCHC: 32.9 g/dL (ref 30.0–36.0)
MCV: 104.8 fL — ABNORMAL HIGH (ref 80.0–100.0)
Monocytes Absolute: 0.2 10*3/uL (ref 0.1–1.0)
Monocytes Relative: 18 %
Neutro Abs: 0.3 10*3/uL — CL (ref 1.7–7.7)
Neutrophils Relative %: 23 %
Platelets: 89 10*3/uL — ABNORMAL LOW (ref 150–400)
RBC: 2.7 MIL/uL — ABNORMAL LOW (ref 3.87–5.11)
RDW: 28.2 % — ABNORMAL HIGH (ref 11.5–15.5)
WBC: 1.1 10*3/uL — ABNORMAL LOW (ref 4.0–10.5)
nRBC: 2.6 % — ABNORMAL HIGH (ref 0.0–0.2)

## 2023-06-03 LAB — BPAM RBC
Blood Product Expiration Date: 202412202359
Blood Product Unit Number: 202412202359
ISSUE DATE / TIME: 202412140913
PRODUCT CODE: 202412140913
PRODUCT CODE: 202412202359
Unit Type and Rh: 9500
Unit Type and Rh: 202412202359
Unit Type and Rh: 9500
Unit Type and Rh: 9500

## 2023-06-03 LAB — TYPE AND SCREEN
ABO/RH(D): O POS
Antibody Screen: NEGATIVE
Unit division: 0
Unit division: 0

## 2023-06-03 MED ORDER — VORICONAZOLE 200 MG PO TABS: 200.0000 mg | ORAL_TABLET | Freq: Two times a day (BID) | ORAL | 1 refills | Status: DC

## 2023-06-03 MED ORDER — SODIUM CHLORIDE 0.9 % IV SOLN
20.0000 mg/m2 | Freq: Once | INTRAVENOUS | Status: AC
  Administered 2023-06-03: 40 mg via INTRAVENOUS
  Filled 2023-06-03: qty 8

## 2023-06-03 MED ORDER — PROCHLORPERAZINE MALEATE 10 MG PO TABS
10.0000 mg | ORAL_TABLET | Freq: Once | ORAL | Status: AC
  Administered 2023-06-03: 10 mg via ORAL
  Filled 2023-06-03: qty 1

## 2023-06-03 MED ORDER — SODIUM CHLORIDE 0.9% FLUSH
10.0000 mL | INTRAVENOUS | Status: DC | PRN
Start: 2023-06-03 — End: 2023-06-03
  Administered 2023-06-03 (×2): 10 mL

## 2023-06-03 MED ORDER — IOHEXOL 350 MG/ML SOLN
75.0000 mL | Freq: Once | INTRAVENOUS | Status: AC | PRN
  Administered 2023-06-03: 75 mL via INTRAVENOUS

## 2023-06-03 MED ORDER — HEPARIN SOD (PORK) LOCK FLUSH 100 UNIT/ML IV SOLN
INTRAVENOUS | Status: AC
  Filled 2023-06-03: qty 5

## 2023-06-03 MED ORDER — CIPROFLOXACIN HCL 500 MG PO TABS
500.0000 mg | ORAL_TABLET | Freq: Two times a day (BID) | ORAL | 1 refills | Status: DC
Start: 2023-06-03 — End: 2023-07-29

## 2023-06-03 MED ORDER — HEPARIN SOD (PORK) LOCK FLUSH 100 UNIT/ML IV SOLN
500.0000 [IU] | Freq: Once | INTRAVENOUS | Status: AC
  Administered 2023-06-03: 500 [IU] via INTRAVENOUS

## 2023-06-03 MED ORDER — SODIUM CHLORIDE 0.9 % IV SOLN
Freq: Once | INTRAVENOUS | Status: AC
Start: 2023-06-03 — End: 2023-06-03

## 2023-06-03 MED ORDER — HEPARIN SOD (PORK) LOCK FLUSH 100 UNIT/ML IV SOLN
500.0000 [IU] | Freq: Once | INTRAVENOUS | Status: AC | PRN
  Administered 2023-06-03: 500 [IU]

## 2023-06-03 NOTE — Progress Notes (Signed)
Ordering Cipro and Voriconazole after consultation with Dr. 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    Lillard Anes, NP 06/03/23 3:32 PM Medical Oncology and Hematology [facility] [address] [place], [place] [address] Tel. [phone]    Fax. [phone]

## 2023-06-03 NOTE — Assessment & Plan Note (Signed)
MDS--on treatment with Decitabine, will proceed today as scheduled  Anemia Hemoglobin 9.3 today, energy level improved.   Shortness of Breath Not improved with blood transfusion, mildly elevated d-dimer CT angiogram chest, PE protocol ordered  Red flags reviewed around when to go to ER  Neutropenia Chronic low white blood cell count since at least August 2024. -Reviewed with Dr. Al Pimple.  Awaiting recommendations from pharmacy and will prescribe prophylaxis.    Will continue decitabine as scheduled.

## 2023-06-03 NOTE — Assessment & Plan Note (Signed)
MDS--on treatment with Decitabine  Anemia Hemoglobin decreased to 7.1, likely contributing to shortness of breath and discomfort on inspiration. No history of heart issues. -Transfuse 2 units of blood on 06/01/2023. -Check hemoglobin levels on 06/03/2023.  Shortness of Breath Mild discomfort on inspiration and shortness of breath on exertion. Chest X-ray clear. D-dimer slightly elevated. No fever or productive cough. -If shortness of breath worsens despite blood transfusion, proceed to emergency room for CT chest and further evaluation.  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 -Consult with pharmacist regarding potential need for preventative medications against specific types of pneumonia or fungal infections.  General Health Maintenance -Continue Prednisone 20mg  daily for 10-day course as prescribed by Dr. Dellis Anes. -Review medication list with pharmacist for potential adjustments. -Follow-up appointment on 06/03/2023.

## 2023-06-03 NOTE — Progress Notes (Signed)
[facility] Cancer Follow up:    [name], [name], MD [address] [place] [address]   DIAGNOSIS:  Cancer Staging  No matching staging information was found for the patient.   SUMMARY OF ONCOLOGIC HISTORY: Oncology History  MDS (myelodysplastic syndrome), high grade (HCC)  01/07/2023 Initial Biopsy   Bone marrow biopsy:cellular bone marrow with focal areas of hypercellularity (approximately up to  50%).  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  Blasts appear mildly mildly increased, approximately 5% to focally up to 10%.  Flow cytometric analysis reveals 5% CD34 positive blasts.  While the findings could be attributable to the patient's coexisting conditions, the possibility of a myeloid neoplasm such as myelodysplastic neoplasm with increased blasts cannot be entirely excluded. normal karyotype   01/29/2023 Initial Diagnosis   MDS (myelodysplastic syndrome), high grade (HCC)   02/04/2023 - 02/06/2023 Chemotherapy   Patient is on Treatment Plan : MYELODYSPLASIA  Azacitidine IV D1-5 q28d     03/04/2023 -  Chemotherapy   Patient is on Treatment Plan : MYELODYSPLASIA Decitabine D1-5 q28d       CURRENT THERAPY: Decitabine  INTERVAL HISTORY: [name] [name] 77 y.o. female returns for follow-up of her MDS after receiving 2 units of blood on Saturday, 06/01/2023 and lasix in between.  Feeling more energy, still short of breath with no real difference.  She also describes a pleuritic chest pain that happens when she takes deep breaths.   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) 04/26/2023   Hypokalemia 04/26/2023   Neutropenic fever (HCC) 04/25/2023   Hives 03/18/2023   MDS (myelodysplastic syndrome), high grade (HCC) 01/29/2023   Obesity 03/02/2021   Other long term (current) drug therapy 03/02/2021   Primary osteoarthritis 03/02/2021   SOB (shortness of breath) 12/08/2020    Pericardial effusion 12/08/2020   Dry eyes/dry mouth 05/25/2019   Bronchitis, mucopurulent recurrent (HCC) 02/26/2019   Moderate persistent asthma without complication 02/24/2019   Perennial and seasonal allergic rhinitis 02/24/2019   Seasonal allergic conjunctivitis 02/24/2019   Primary osteoarthritis of both knees 04/10/2016   Lung nodule 08/19/2014   HTN (hypertension) 08/07/2012   HLD (hyperlipidemia) 09/26/2011   Asthma 09/06/2010   Depression 09/06/2010   History of allergy 09/06/2010   Hypothyroidism 09/06/2010   Lower back pain 09/06/2010   Arthritis 09/06/2010   Pain in joint, pelvic region and thigh 09/06/2010   Peripheral neuropathy 09/06/2010   Psoriasis 09/06/2010   Therapeutic drug monitoring 09/06/2010   Tinnitus 09/06/2010   Vitamin B 12 deficiency 09/06/2010   Cervical spondylosis without myelopathy 04/24/2006   Tear of lateral cartilage or meniscus of knee, current 03/13/2006    is allergic to bee pollen, latex, pollen extract-tree extract [pollen extract], cat hair extract, dust mite extract, and molds & smuts.  MEDICAL HISTORY: Past Medical History:  Diagnosis Date   Asthma    Diverticulitis    Hypothyroid    MDS (myelodysplastic syndrome) (HCC) 01/2023   Psoriatic arthritis (HCC)     SURGICAL HISTORY: Past Surgical History:  Procedure Laterality Date   COLONOSCOPY  08/2021   HIP SURGERY Bilateral    IR IMAGING GUIDED PORT INSERTION  02/20/2023   TONSILLECTOMY     TUBAL LIGATION      SOCIAL HISTORY: Social History   Socioeconomic History   Marital status: Widowed    Spouse name: Not on file   Number of children: Not  on file   Years of education: Not on file   Highest education level: Not on file  Occupational History   Not on file  Tobacco Use   Smoking status: Former    Current packs/day: 0.00    Average packs/day: 0.1 packs/day for 33.4 years (3.3 ttl pk-yrs)    Types: Cigarettes    Start date: 30    Quit date: 11/18/2000    Years  since quitting: 22.5   Smokeless tobacco: Never   Tobacco comments:    3 cigarettes a day  Vaping Use   Vaping status: Never Used  Substance and Sexual Activity   Alcohol use: Never   Drug use: Never   Sexual activity: Not Currently  Other Topics Concern   Not on file  Social History Narrative   Lives with children   Social Drivers of Health   Financial Resource Strain: Not on file  Food Insecurity: No Food Insecurity (04/26/2023)   Hunger Vital Sign    Worried About Running Out of Food in the Last Year: Never true    Ran Out of Food in the Last Year: Never true  Transportation Needs: No Transportation Needs (04/26/2023)   PRAPARE - Administrator, Civil Service (Medical): No    Lack of Transportation (Non-Medical): No  Physical Activity: Not on file  Stress: Not on file  Social Connections: Not on file  Intimate Partner Violence: Not At Risk (04/26/2023)   Humiliation, Afraid, Rape, and Kick questionnaire    Fear of Current or Ex-Partner: No    Emotionally Abused: No    Physically Abused: No    Sexually Abused: No    FAMILY HISTORY: Family History  Problem Relation Age of Onset   Asthma Mother    Allergic rhinitis Mother    Rheum arthritis Mother    Allergic rhinitis Father    Alzheimer's disease Father    Eczema Brother    Allergic rhinitis Brother    Colon cancer Neg Hx    Esophageal cancer Neg Hx    Stomach cancer Neg Hx    Rectal cancer Neg Hx     Review of Systems  Constitutional:  Negative for appetite change, chills, fatigue, fever and unexpected weight change.  HENT:   Negative for hearing loss, lump/mass and trouble swallowing.   Eyes:  Negative for eye problems and icterus.  Respiratory:  Positive for cough and shortness of breath. Negative for chest tightness.   Cardiovascular:  Negative for chest pain, leg swelling and palpitations.  Gastrointestinal:  Negative for abdominal distention, abdominal pain, constipation, diarrhea, nausea and  vomiting.  Endocrine: Negative for hot flashes.  Genitourinary:  Negative for difficulty urinating.   Musculoskeletal:  Negative for arthralgias.  Skin:  Negative for itching and rash.  Neurological:  Negative for dizziness, extremity weakness, headaches and numbness.  Hematological:  Negative for adenopathy. Does not bruise/bleed easily.  Psychiatric/Behavioral:  Negative for depression. The patient is not nervous/anxious.       PHYSICAL EXAMINATION    Vitals:   06/03/23 1302  BP: 123/64  Pulse: 66  Resp: 18  Temp: 98 F (36.7 C)  SpO2: 100%    Physical Exam Constitutional:      General: She is not in acute distress.    Appearance: Normal appearance. She is not toxic-appearing.  HENT:     Head: Normocephalic and atraumatic.     Mouth/Throat:     Mouth: Mucous membranes are moist.     Pharynx: Oropharynx  is clear. No oropharyngeal exudate or posterior oropharyngeal erythema.  Eyes:     General: No scleral icterus. Cardiovascular:     Rate and Rhythm: Normal rate and regular rhythm.     Pulses: Normal pulses.     Heart sounds: Normal heart sounds.  Pulmonary:     Effort: Pulmonary effort is normal.     Breath sounds: Normal breath sounds.  Abdominal:     General: Abdomen is flat. Bowel sounds are normal. There is no distension.     Palpations: Abdomen is soft.     Tenderness: There is no abdominal tenderness.  Musculoskeletal:        General: No swelling.     Cervical back: Neck supple.  Lymphadenopathy:     Cervical: No cervical adenopathy.  Skin:    General: Skin is warm and dry.     Findings: No rash.  Neurological:     General: No focal deficit present.     Mental Status: She is alert.  Psychiatric:        Mood and Affect: Mood normal.        Behavior: Behavior normal.     LABORATORY DATA:  CBC    Component Value Date/Time   WBC 1.1 (L) 06/03/2023 1237   RBC 2.70 (L) 06/03/2023 1237   HGB 9.3 (L) 06/03/2023 1237   HGB 7.1 (L) 05/31/2023 0858    HCT 28.3 (L) 06/03/2023 1237   HCT 39.0 11/24/2022 1035   PLT 89 (L) 06/03/2023 1237   PLT 103 (L) 05/31/2023 0858   MCV 104.8 (H) 06/03/2023 1237   MCH 34.4 (H) 06/03/2023 1237   MCHC 32.9 06/03/2023 1237   RDW 28.2 (H) 06/03/2023 1237   LYMPHSABS 0.7 06/03/2023 1237   MONOABS 0.2 06/03/2023 1237   EOSABS 0.0 06/03/2023 1237   BASOSABS 0.0 06/03/2023 1237    CMP     Component Value Date/Time   NA 138 05/31/2023 0858   K 3.9 05/31/2023 0858   CL 107 05/31/2023 0858   CO2 28 05/31/2023 0858   GLUCOSE 85 05/31/2023 0858   BUN 11 05/31/2023 0858   CREATININE 0.37 (L) 05/31/2023 0858   CALCIUM 8.7 (L) 05/31/2023 0858   PROT 6.8 05/31/2023 0858   ALBUMIN 3.6 05/31/2023 0858   AST 12 (L) 05/31/2023 0858   ALT 6 05/31/2023 0858   ALKPHOS 51 05/31/2023 0858   BILITOT 0.4 05/31/2023 0858   GFRNONAA >60 05/31/2023 0858     ASSESSMENT and THERAPY PLAN:   MDS (myelodysplastic syndrome), high grade (HCC) MDS--on treatment with Decitabine, will proceed today as scheduled  Anemia Hemoglobin 9.3 today, energy level improved.   Shortness of Breath Not improved with blood transfusion, mildly elevated d-dimer CT angiogram chest, PE protocol ordered  Red flags reviewed around when to go to ER  Neutropenia Chronic low white blood cell count since at least August 2024. -Reviewed with Dr. Al Pimple.  Awaiting recommendations from pharmacy and will prescribe prophylaxis.    Will continue decitabine as scheduled.    All questions were answered. The patient knows to call the clinic with any problems, questions or concerns. We can certainly see the patient much sooner if necessary.  Total encounter time:30 minutes*in face-to-face visit time, chart review, lab review, care coordination, order entry, and documentation of the encounter time.    Lillard Anes, NP 06/03/23 1:37 PM Medical Oncology and Hematology [facility] [address] [place], [place] [address] Tel.  [phone]    Fax.  [phone]  *Total Encounter Time as defined by the Centers for Medicare and Medicaid Services includes, in addition to the face-to-face time of a patient visit (documented in the note above) non-face-to-face time: obtaining and reviewing outside history, ordering and reviewing medications, tests or procedures, care coordination (communications with other health care professionals or caregivers) and documentation in the medical record.

## 2023-06-03 NOTE — Patient Instructions (Signed)
 [facility] - A DEPT OF [facility]  Discharge Instructions: Thank you for choosing [facility] to provide your oncology and hematology care.   If you have a lab appointment with the Cancer Center, please go directly to the Cancer Center and check in at the registration area.   Wear comfortable clothing and clothing appropriate for easy access to any Portacath or PICC line.   We strive to give you quality time with your provider. You may need to reschedule your appointment if you arrive late (15 or more minutes).  Arriving late affects you and other patients whose appointments are after yours.  Also, if you miss three or more appointments without notifying the office, you may be dismissed from the clinic at the provider's discretion.      For prescription refill requests, have your pharmacy contact our office and allow 72 hours for refills to be completed.    Today you received the following chemotherapy and/or immunotherapy agents Dacogen      To help prevent nausea and vomiting after your treatment, we encourage you to take your nausea medication as directed.  BELOW ARE SYMPTOMS THAT SHOULD BE REPORTED IMMEDIATELY: *FEVER GREATER THAN 100.4 F (38 C) OR HIGHER *CHILLS OR SWEATING *NAUSEA AND VOMITING THAT IS NOT CONTROLLED WITH YOUR NAUSEA MEDICATION *UNUSUAL SHORTNESS OF BREATH *UNUSUAL BRUISING OR BLEEDING *URINARY PROBLEMS (pain or burning when urinating, or frequent urination) *BOWEL PROBLEMS (unusual diarrhea, constipation, pain near the anus) TENDERNESS IN MOUTH AND THROAT WITH OR WITHOUT PRESENCE OF ULCERS (sore throat, sores in mouth, or a toothache) UNUSUAL RASH, SWELLING OR PAIN  UNUSUAL VAGINAL DISCHARGE OR ITCHING   Items with * indicate a potential emergency and should be followed up as soon as possible or go to the Emergency Department if any problems should occur.  Please show the CHEMOTHERAPY ALERT CARD or IMMUNOTHERAPY  ALERT CARD at check-in to the Emergency Department and triage nurse.  Should you have questions after your visit or need to cancel or reschedule your appointment, please contact [facility] - A DEPT OF [facility]  Dept: [phone]  and follow the prompts.  Office hours are 8:00 a.m. to 4:30 p.m. Monday - Friday. Please note that voicemails left after 4:00 p.m. may not be returned until the following business day.  We are closed weekends and major holidays. You have access to a nurse at all times for urgent questions. Please call the main number to the clinic Dept: [phone] and follow the prompts.   For any non-urgent questions, you may also contact your provider using MyChart. We now offer e-Visits for anyone 23 and older to request care online for non-urgent symptoms. For details visit [email or web address].   Also download the MyChart app! Go to the app store, search "MyChart", open the app, select [facility], and log in with your MyChart username and password.

## 2023-06-04 ENCOUNTER — Telehealth: Payer: Self-pay

## 2023-06-04 ENCOUNTER — Inpatient Hospital Stay: Payer: Medicare Other

## 2023-06-04 VITALS — BP 114/58 | HR 75 | Temp 98.1°F | Resp 16

## 2023-06-04 DIAGNOSIS — Z5111 Encounter for antineoplastic chemotherapy: Secondary | ICD-10-CM | POA: Diagnosis not present

## 2023-06-04 DIAGNOSIS — D46Z Other myelodysplastic syndromes: Secondary | ICD-10-CM

## 2023-06-04 MED ORDER — SODIUM CHLORIDE 0.9 % IV SOLN
20.0000 mg/m2 | Freq: Once | INTRAVENOUS | Status: AC
  Administered 2023-06-04: 40 mg via INTRAVENOUS
  Filled 2023-06-04: qty 8

## 2023-06-04 MED ORDER — SODIUM CHLORIDE 0.9% FLUSH
10.0000 mL | INTRAVENOUS | Status: DC | PRN
  Administered 2023-06-04: 10 mL

## 2023-06-04 MED ORDER — PROCHLORPERAZINE MALEATE 10 MG PO TABS
10.0000 mg | ORAL_TABLET | Freq: Once | ORAL | Status: AC
  Administered 2023-06-04: 10 mg via ORAL
  Filled 2023-06-04: qty 1

## 2023-06-04 MED ORDER — SODIUM CHLORIDE 0.9 % IV SOLN: Freq: Once | INTRAVENOUS | Status: AC

## 2023-06-04 MED ORDER — HEPARIN SOD (PORK) LOCK FLUSH 100 UNIT/ML IV SOLN
500.0000 [IU] | Freq: Once | INTRAVENOUS | Status: AC | PRN
Start: 2023-06-04 — End: 2023-06-04
  Administered 2023-06-04: 500 [IU]

## 2023-06-04 NOTE — Telephone Encounter (Addendum)
-----   Called pt per message below. Pt verbalized understanding.. Message from Noreene Filbert sent at 06/04/2023  8:54 AM EST ----- CT chest negative for blood clot in the lung or other abnormality.  Please let patien tknow.  Please also let her know that Dr. Al Pimple recommended Voriconazole (antifungal) and Cipro (antibiotic) for her to take since her WBC has been low for so long.  I sent those to her pharmacy late yesterday  Thanks, LC ----- Message ----- From: Interface, Rad Results In Sent: 06/03/2023   6:51 PM EST To: Loa Socks, NP

## 2023-06-04 NOTE — Patient Instructions (Signed)
 [facility] - A DEPT OF [facility]  Discharge Instructions: Thank you for choosing [facility] to provide your oncology and hematology care.   If you have a lab appointment with the Cancer Center, please go directly to the Cancer Center and check in at the registration area.   Wear comfortable clothing and clothing appropriate for easy access to any Portacath or PICC line.   We strive to give you quality time with your provider. You may need to reschedule your appointment if you arrive late (15 or more minutes).  Arriving late affects you and other patients whose appointments are after yours.  Also, if you miss three or more appointments without notifying the office, you may be dismissed from the clinic at the provider's discretion.      For prescription refill requests, have your pharmacy contact our office and allow 72 hours for refills to be completed.    Today you received the following chemotherapy and/or immunotherapy agents Dacogen      To help prevent nausea and vomiting after your treatment, we encourage you to take your nausea medication as directed.  BELOW ARE SYMPTOMS THAT SHOULD BE REPORTED IMMEDIATELY: *FEVER GREATER THAN 100.4 F (38 C) OR HIGHER *CHILLS OR SWEATING *NAUSEA AND VOMITING THAT IS NOT CONTROLLED WITH YOUR NAUSEA MEDICATION *UNUSUAL SHORTNESS OF BREATH *UNUSUAL BRUISING OR BLEEDING *URINARY PROBLEMS (pain or burning when urinating, or frequent urination) *BOWEL PROBLEMS (unusual diarrhea, constipation, pain near the anus) TENDERNESS IN MOUTH AND THROAT WITH OR WITHOUT PRESENCE OF ULCERS (sore throat, sores in mouth, or a toothache) UNUSUAL RASH, SWELLING OR PAIN  UNUSUAL VAGINAL DISCHARGE OR ITCHING   Items with * indicate a potential emergency and should be followed up as soon as possible or go to the Emergency Department if any problems should occur.  Please show the CHEMOTHERAPY ALERT CARD or IMMUNOTHERAPY  ALERT CARD at check-in to the Emergency Department and triage nurse.  Should you have questions after your visit or need to cancel or reschedule your appointment, please contact [facility] - A DEPT OF [facility]  Dept: [phone]  and follow the prompts.  Office hours are 8:00 a.m. to 4:30 p.m. Monday - Friday. Please note that voicemails left after 4:00 p.m. may not be returned until the following business day.  We are closed weekends and major holidays. You have access to a nurse at all times for urgent questions. Please call the main number to the clinic Dept: [phone] and follow the prompts.   For any non-urgent questions, you may also contact your provider using MyChart. We now offer e-Visits for anyone 23 and older to request care online for non-urgent symptoms. For details visit [email or web address].   Also download the MyChart app! Go to the app store, search "MyChart", open the app, select [facility], and log in with your MyChart username and password.

## 2023-06-05 ENCOUNTER — Inpatient Hospital Stay: Payer: Medicare Other

## 2023-06-05 VITALS — BP 125/71 | HR 75 | Temp 99.0°F | Resp 16

## 2023-06-05 DIAGNOSIS — D46Z Other myelodysplastic syndromes: Secondary | ICD-10-CM

## 2023-06-05 DIAGNOSIS — Z5111 Encounter for antineoplastic chemotherapy: Secondary | ICD-10-CM | POA: Diagnosis not present

## 2023-06-05 MED ORDER — PROCHLORPERAZINE MALEATE 10 MG PO TABS
10.0000 mg | ORAL_TABLET | Freq: Once | ORAL | Status: AC
  Administered 2023-06-05: 10 mg via ORAL
  Filled 2023-06-05: qty 1

## 2023-06-05 MED ORDER — SODIUM CHLORIDE 0.9 % IV SOLN
20.0000 mg/m2 | Freq: Once | INTRAVENOUS | Status: AC
  Administered 2023-06-05: 40 mg via INTRAVENOUS
  Filled 2023-06-05: qty 8

## 2023-06-05 MED ORDER — SODIUM CHLORIDE 0.9% FLUSH
10.0000 mL | INTRAVENOUS | Status: DC | PRN
  Administered 2023-06-05: 10 mL

## 2023-06-05 MED ORDER — HEPARIN SOD (PORK) LOCK FLUSH 100 UNIT/ML IV SOLN
500.0000 [IU] | Freq: Once | INTRAVENOUS | Status: AC | PRN
  Administered 2023-06-05: 500 [IU]

## 2023-06-05 MED ORDER — SODIUM CHLORIDE 0.9 % IV SOLN: Freq: Once | INTRAVENOUS | Status: AC

## 2023-06-06 ENCOUNTER — Inpatient Hospital Stay: Payer: Medicare Other

## 2023-06-06 ENCOUNTER — Encounter: Payer: Self-pay | Admitting: Adult Health

## 2023-06-06 VITALS — BP 131/64 | HR 74 | Temp 98.2°F | Resp 16

## 2023-06-06 DIAGNOSIS — Z5111 Encounter for antineoplastic chemotherapy: Secondary | ICD-10-CM | POA: Diagnosis not present

## 2023-06-06 DIAGNOSIS — D46Z Other myelodysplastic syndromes: Secondary | ICD-10-CM

## 2023-06-06 MED ORDER — SODIUM CHLORIDE 0.9 % IV SOLN
20.0000 mg/m2 | Freq: Once | INTRAVENOUS | Status: AC
  Administered 2023-06-06: 40 mg via INTRAVENOUS
  Filled 2023-06-06: qty 8

## 2023-06-06 MED ORDER — SODIUM CHLORIDE 0.9 % IV SOLN: Freq: Once | INTRAVENOUS | Status: AC

## 2023-06-06 MED ORDER — PROCHLORPERAZINE MALEATE 10 MG PO TABS
10.0000 mg | ORAL_TABLET | Freq: Once | ORAL | Status: AC
  Administered 2023-06-06: 10 mg via ORAL
  Filled 2023-06-06: qty 1

## 2023-06-06 MED ORDER — HEPARIN SOD (PORK) LOCK FLUSH 100 UNIT/ML IV SOLN
500.0000 [IU] | Freq: Once | INTRAVENOUS | Status: AC | PRN
Start: 2023-06-06 — End: 2023-06-06
  Administered 2023-06-06: 500 [IU]

## 2023-06-06 MED ORDER — SODIUM CHLORIDE 0.9% FLUSH
10.0000 mL | INTRAVENOUS | Status: DC | PRN
  Administered 2023-06-06: 10 mL

## 2023-06-06 NOTE — Patient Instructions (Signed)
 [facility] - A DEPT OF [facility]  Discharge Instructions: Thank you for choosing [facility] to provide your oncology and hematology care.   If you have a lab appointment with the Cancer Center, please go directly to the Cancer Center and check in at the registration area.   Wear comfortable clothing and clothing appropriate for easy access to any Portacath or PICC line.   We strive to give you quality time with your provider. You may need to reschedule your appointment if you arrive late (15 or more minutes).  Arriving late affects you and other patients whose appointments are after yours.  Also, if you miss three or more appointments without notifying the office, you may be dismissed from the clinic at the provider's discretion.      For prescription refill requests, have your pharmacy contact our office and allow 72 hours for refills to be completed.    Today you received the following chemotherapy and/or immunotherapy agents Dacogen      To help prevent nausea and vomiting after your treatment, we encourage you to take your nausea medication as directed.  BELOW ARE SYMPTOMS THAT SHOULD BE REPORTED IMMEDIATELY: *FEVER GREATER THAN 100.4 F (38 C) OR HIGHER *CHILLS OR SWEATING *NAUSEA AND VOMITING THAT IS NOT CONTROLLED WITH YOUR NAUSEA MEDICATION *UNUSUAL SHORTNESS OF BREATH *UNUSUAL BRUISING OR BLEEDING *URINARY PROBLEMS (pain or burning when urinating, or frequent urination) *BOWEL PROBLEMS (unusual diarrhea, constipation, pain near the anus) TENDERNESS IN MOUTH AND THROAT WITH OR WITHOUT PRESENCE OF ULCERS (sore throat, sores in mouth, or a toothache) UNUSUAL RASH, SWELLING OR PAIN  UNUSUAL VAGINAL DISCHARGE OR ITCHING   Items with * indicate a potential emergency and should be followed up as soon as possible or go to the Emergency Department if any problems should occur.  Please show the CHEMOTHERAPY ALERT CARD or IMMUNOTHERAPY  ALERT CARD at check-in to the Emergency Department and triage nurse.  Should you have questions after your visit or need to cancel or reschedule your appointment, please contact [facility] - A DEPT OF [facility]  Dept: [phone]  and follow the prompts.  Office hours are 8:00 a.m. to 4:30 p.m. Monday - Friday. Please note that voicemails left after 4:00 p.m. may not be returned until the following business day.  We are closed weekends and major holidays. You have access to a nurse at all times for urgent questions. Please call the main number to the clinic Dept: [phone] and follow the prompts.   For any non-urgent questions, you may also contact your provider using MyChart. We now offer e-Visits for anyone 23 and older to request care online for non-urgent symptoms. For details visit [email or web address].   Also download the MyChart app! Go to the app store, search "MyChart", open the app, select [facility], and log in with your MyChart username and password.

## 2023-06-13 ENCOUNTER — Encounter: Payer: Self-pay | Admitting: Hematology and Oncology

## 2023-06-25 ENCOUNTER — Encounter: Payer: Self-pay | Admitting: Hematology and Oncology

## 2023-06-25 ENCOUNTER — Telehealth: Payer: Self-pay | Admitting: *Deleted

## 2023-06-25 ENCOUNTER — Other Ambulatory Visit: Payer: Self-pay | Admitting: *Deleted

## 2023-06-25 NOTE — Telephone Encounter (Signed)
 There are no remaining days on her dacogen plan.  Dr. Al Pimple needs to add days and send a scheduling message. Lorayne Marek, RN

## 2023-06-25 NOTE — Telephone Encounter (Signed)
 Pt does not have appointments for next cycle of dacogen - d1-5 every 28 days- last given 12/16 - 20.  Per review - noted pt's treatment plan has no further dates.  This message will be forwarded to MD for review-

## 2023-06-26 ENCOUNTER — Other Ambulatory Visit: Payer: Self-pay | Admitting: Hematology and Oncology

## 2023-06-26 DIAGNOSIS — D46Z Other myelodysplastic syndromes: Secondary | ICD-10-CM

## 2023-07-01 ENCOUNTER — Inpatient Hospital Stay: Payer: Medicare Other | Attending: Hematology and Oncology

## 2023-07-01 ENCOUNTER — Encounter: Payer: Self-pay | Admitting: Hematology and Oncology

## 2023-07-01 ENCOUNTER — Telehealth: Payer: Self-pay | Admitting: *Deleted

## 2023-07-01 ENCOUNTER — Inpatient Hospital Stay (HOSPITAL_BASED_OUTPATIENT_CLINIC_OR_DEPARTMENT_OTHER): Payer: Medicare Other | Admitting: Hematology and Oncology

## 2023-07-01 ENCOUNTER — Inpatient Hospital Stay: Payer: Medicare Other

## 2023-07-01 VITALS — BP 120/58 | HR 79 | Temp 97.7°F | Resp 16 | Wt 189.8 lb

## 2023-07-01 VITALS — BP 114/59 | HR 64 | Resp 16

## 2023-07-01 DIAGNOSIS — Z5111 Encounter for antineoplastic chemotherapy: Secondary | ICD-10-CM | POA: Diagnosis present

## 2023-07-01 DIAGNOSIS — Z9851 Tubal ligation status: Secondary | ICD-10-CM | POA: Diagnosis not present

## 2023-07-01 DIAGNOSIS — R1310 Dysphagia, unspecified: Secondary | ICD-10-CM | POA: Insufficient documentation

## 2023-07-01 DIAGNOSIS — D469 Myelodysplastic syndrome, unspecified: Secondary | ICD-10-CM | POA: Insufficient documentation

## 2023-07-01 DIAGNOSIS — E876 Hypokalemia: Secondary | ICD-10-CM | POA: Diagnosis not present

## 2023-07-01 DIAGNOSIS — Z79899 Other long term (current) drug therapy: Secondary | ICD-10-CM | POA: Diagnosis not present

## 2023-07-01 DIAGNOSIS — J4540 Moderate persistent asthma, uncomplicated: Secondary | ICD-10-CM | POA: Diagnosis not present

## 2023-07-01 DIAGNOSIS — D46Z Other myelodysplastic syndromes: Secondary | ICD-10-CM

## 2023-07-01 DIAGNOSIS — D709 Neutropenia, unspecified: Secondary | ICD-10-CM | POA: Diagnosis not present

## 2023-07-01 DIAGNOSIS — D696 Thrombocytopenia, unspecified: Secondary | ICD-10-CM | POA: Insufficient documentation

## 2023-07-01 DIAGNOSIS — T451X5A Adverse effect of antineoplastic and immunosuppressive drugs, initial encounter: Secondary | ICD-10-CM | POA: Insufficient documentation

## 2023-07-01 DIAGNOSIS — Z9089 Acquired absence of other organs: Secondary | ICD-10-CM | POA: Insufficient documentation

## 2023-07-01 DIAGNOSIS — R5081 Fever presenting with conditions classified elsewhere: Secondary | ICD-10-CM | POA: Insufficient documentation

## 2023-07-01 DIAGNOSIS — Z8261 Family history of arthritis: Secondary | ICD-10-CM | POA: Diagnosis not present

## 2023-07-01 DIAGNOSIS — Z840 Family history of diseases of the skin and subcutaneous tissue: Secondary | ICD-10-CM | POA: Diagnosis not present

## 2023-07-01 DIAGNOSIS — Z818 Family history of other mental and behavioral disorders: Secondary | ICD-10-CM | POA: Diagnosis not present

## 2023-07-01 DIAGNOSIS — D61810 Antineoplastic chemotherapy induced pancytopenia: Secondary | ICD-10-CM | POA: Insufficient documentation

## 2023-07-01 DIAGNOSIS — R21 Rash and other nonspecific skin eruption: Secondary | ICD-10-CM | POA: Insufficient documentation

## 2023-07-01 DIAGNOSIS — Z87891 Personal history of nicotine dependence: Secondary | ICD-10-CM | POA: Insufficient documentation

## 2023-07-01 DIAGNOSIS — Z91030 Bee allergy status: Secondary | ICD-10-CM | POA: Diagnosis not present

## 2023-07-01 DIAGNOSIS — K5900 Constipation, unspecified: Secondary | ICD-10-CM | POA: Insufficient documentation

## 2023-07-01 DIAGNOSIS — Z825 Family history of asthma and other chronic lower respiratory diseases: Secondary | ICD-10-CM | POA: Diagnosis not present

## 2023-07-01 LAB — CBC WITH DIFFERENTIAL (CANCER CENTER ONLY)
Abs Immature Granulocytes: 0.01 10*3/uL (ref 0.00–0.07)
Basophils Absolute: 0 10*3/uL (ref 0.0–0.1)
Basophils Relative: 1 %
Eosinophils Absolute: 0 10*3/uL (ref 0.0–0.5)
Eosinophils Relative: 1 %
HCT: 30.9 % — ABNORMAL LOW (ref 36.0–46.0)
Hemoglobin: 10.3 g/dL — ABNORMAL LOW (ref 12.0–15.0)
Immature Granulocytes: 0 %
Lymphocytes Relative: 82 %
Lymphs Abs: 2.1 10*3/uL (ref 0.7–4.0)
MCH: 35.8 pg — ABNORMAL HIGH (ref 26.0–34.0)
MCHC: 33.3 g/dL (ref 30.0–36.0)
MCV: 107.3 fL — ABNORMAL HIGH (ref 80.0–100.0)
Monocytes Absolute: 0.3 10*3/uL (ref 0.1–1.0)
Monocytes Relative: 12 %
Neutro Abs: 0.1 10*3/uL — CL (ref 1.7–7.7)
Neutrophils Relative %: 4 %
Platelet Count: 182 10*3/uL (ref 150–400)
RBC: 2.88 MIL/uL — ABNORMAL LOW (ref 3.87–5.11)
RDW: 23.2 % — ABNORMAL HIGH (ref 11.5–15.5)
WBC Count: 2.7 10*3/uL — ABNORMAL LOW (ref 4.0–10.5)
nRBC: 0 % (ref 0.0–0.2)

## 2023-07-01 LAB — CMP (CANCER CENTER ONLY)
ALT: 9 U/L (ref 0–44)
AST: 18 U/L (ref 15–41)
Albumin: 4.1 g/dL (ref 3.5–5.0)
Alkaline Phosphatase: 53 U/L (ref 38–126)
Anion gap: 7 (ref 5–15)
BUN: 18 mg/dL (ref 8–23)
CO2: 27 mmol/L (ref 22–32)
Calcium: 9.1 mg/dL (ref 8.9–10.3)
Chloride: 106 mmol/L (ref 98–111)
Creatinine: 0.5 mg/dL (ref 0.44–1.00)
GFR, Estimated: >60 mL/min (ref >60)
Glucose, Bld: 82 mg/dL (ref 70–99)
Potassium: 4 mmol/L (ref 3.5–5.1)
Sodium: 140 mmol/L (ref 135–145)
Total Bilirubin: 0.3 mg/dL (ref 0.0–1.2)
Total Protein: 7.5 g/dL (ref 6.5–8.1)

## 2023-07-01 MED ORDER — SODIUM CHLORIDE 0.9 % IV SOLN
20.0000 mg/m2 | Freq: Once | INTRAVENOUS | Status: AC
Start: 2023-07-01 — End: 2023-07-01
  Administered 2023-07-01: 40 mg via INTRAVENOUS
  Filled 2023-07-01: qty 8

## 2023-07-01 MED ORDER — PROCHLORPERAZINE MALEATE 10 MG PO TABS
10.0000 mg | ORAL_TABLET | Freq: Once | ORAL | Status: AC
  Administered 2023-07-01: 10 mg via ORAL
  Filled 2023-07-01: qty 1

## 2023-07-01 MED ORDER — SODIUM CHLORIDE 0.9 % IV SOLN: Freq: Once | INTRAVENOUS | Status: AC

## 2023-07-01 MED ORDER — HEPARIN SOD (PORK) LOCK FLUSH 100 UNIT/ML IV SOLN
500.0000 [IU] | Freq: Once | INTRAVENOUS | Status: AC | PRN
  Administered 2023-07-01: 500 [IU]

## 2023-07-01 MED ORDER — SODIUM CHLORIDE 0.9% FLUSH
10.0000 mL | INTRAVENOUS | Status: DC | PRN
Start: 2023-07-01 — End: 2023-07-01
  Administered 2023-07-01: 10 mL

## 2023-07-01 NOTE — Telephone Encounter (Signed)
 Per MD ok to proceed with chemo plan per ANC of 0.1.

## 2023-07-01 NOTE — Assessment & Plan Note (Signed)
 Myelodysplastic Syndrome Improved blood counts with Decitabine  treatment. No significant side effects reported except for a rash initially. Patient requires blood transfusion approximately once a month. -Continue Decitabine  treatment for two more cycles. -Plan for bone marrow biopsy in late February or early March to assess disease status. -If remission is achieved, continue Decitabine  as maintenance therapy.  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 Rheumatologist has concerns about continuing Simponi Aria infusions due to antibiotic use. -Wait for differential blood count results to decide on continuation of antibiotic and antifungal therapy. -Communicate with rheumatologist to clarify that antibiotic use is prophylactic and not for active infection.  Constipation Patient reports constipation and uses Miralax  for relief. -Continue current management with Miralax .  Follow-up Plan for next visit in one month.

## 2023-07-01 NOTE — Progress Notes (Signed)
 [facility] Cancer Follow up:    [name] [name], [name] [name] ORN, MD [address] [place] [address]   DIAGNOSIS: Myelodysplastic Syndrome  SUMMARY OF ONCOLOGIC HISTORY: Oncology History  MDS (myelodysplastic syndrome), high grade (HCC)  01/07/2023 Initial Biopsy   Bone marrow biopsy:cellular bone marrow with focal areas of hypercellularity (approximately up to  50%).  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  Blasts appear mildly mildly increased, approximately 5% to focally up to 10%.  Flow cytometric analysis reveals 5% CD34 positive blasts.  While the findings could be attributable to the patient's coexisting conditions, the possibility of a myeloid neoplasm such as myelodysplastic neoplasm with increased blasts cannot be entirely excluded. normal karyotype   01/29/2023 Initial Diagnosis   MDS (myelodysplastic syndrome), high grade (HCC)   02/04/2023 - 02/06/2023 Chemotherapy   Patient is on Treatment Plan : MYELODYSPLASIA  Azacitidine  IV D1-5 q28d     03/04/2023 -  Chemotherapy   Patient is on Treatment Plan : MYELODYSPLASIA Decitabine  D1-5 q28d       CURRENT THERAPY: Azacitidine   [name] [name] [name] [name] 78 y.o. female returns for follow-up while on decitabine .  Discussed the use of AI scribe software for clinical note transcription with the patient, who gave verbal consent to proceed.  History of Present Illness    [name] [name] is here for follow-up.    The patient, with a history of myelodysplastic syndrome, presents for a follow-up after a blood transfusion on December 14th. She reports a 'downslide' in energy levels but overall feels the transfusion was a 'blessing.' She has been on a regimen of decitabine  for four cycles and has tolerated it well, with the exception of a rash at the beginning of treatment.  The patient also has arthritis, for which she receives infusions of Simponi Aria. However, her rheumatologist has expressed reluctance to continue these infusions  due to the patient's concurrent antibiotic prophylaxis. The patient reports that her mobility has been affected by the lack of arthritis treatment.  In addition, the patient has been experiencing constipation, which she manages with Miralax . She also reports frequent bruising, particularly on her forearms.  Rest of the pertinent 10 point ROS reviewed and neg.  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) 04/26/2023   Hypokalemia 04/26/2023   Neutropenic fever (HCC) 04/25/2023   Hives 03/18/2023   MDS (myelodysplastic syndrome), high grade (HCC) 01/29/2023   Obesity 03/02/2021   Other long term (current) drug therapy 03/02/2021   Primary osteoarthritis 03/02/2021   SOB (shortness of breath) 12/08/2020   Pericardial effusion 12/08/2020   Dry eyes/dry mouth 05/25/2019   Bronchitis, mucopurulent recurrent (HCC) 02/26/2019   Moderate persistent asthma without complication 02/24/2019   Perennial and seasonal allergic rhinitis 02/24/2019   Seasonal allergic conjunctivitis 02/24/2019   Primary osteoarthritis of both knees 04/10/2016   Lung nodule 08/19/2014   HTN (hypertension) 08/07/2012   HLD (hyperlipidemia) 09/26/2011   Asthma 09/06/2010   Depression 09/06/2010   History of allergy 09/06/2010   Hypothyroidism 09/06/2010   Lower back pain 09/06/2010   Arthritis 09/06/2010   Pain in joint, pelvic region and thigh 09/06/2010   Peripheral neuropathy 09/06/2010   Psoriasis 09/06/2010   Therapeutic drug monitoring 09/06/2010   Tinnitus 09/06/2010   Vitamin B 12 deficiency 09/06/2010   Cervical spondylosis without myelopathy 04/24/2006   Tear of lateral cartilage or meniscus of knee, current 03/13/2006    is allergic to bee  pollen, latex, pollen extract-tree extract [pollen extract], cat hair extract, dust mite extract, and molds & smuts.  MEDICAL HISTORY: Past Medical History:  Diagnosis Date   Asthma     Diverticulitis    Hypothyroid    MDS (myelodysplastic syndrome) (HCC) 01/2023   Psoriatic arthritis (HCC)     SURGICAL HISTORY: Past Surgical History:  Procedure Laterality Date   COLONOSCOPY  08/2021   HIP SURGERY Bilateral    IR IMAGING GUIDED PORT INSERTION  02/20/2023   TONSILLECTOMY     TUBAL LIGATION      SOCIAL HISTORY: Social History   Socioeconomic History   Marital status: Widowed    Spouse name: Not on file   Number of children: Not on file   Years of education: Not on file   Highest education level: Not on file  Occupational History   Not on file  Tobacco Use   Smoking status: Former    Current packs/day: 0.00    Average packs/day: 0.1 packs/day for 33.4 years (3.3 ttl pk-yrs)    Types: Cigarettes    Start date: 47    Quit date: 11/18/2000    Years since quitting: 22.6   Smokeless tobacco: Never   Tobacco comments:    3 cigarettes a day  Vaping Use   Vaping status: Never Used  Substance and Sexual Activity   Alcohol use: Never   Drug use: Never   Sexual activity: Not Currently  Other Topics Concern   Not on file  Social History Narrative   Lives with children   Social Drivers of Health   Financial Resource Strain: Not on file  Food Insecurity: No Food Insecurity (04/26/2023)   Hunger Vital Sign    Worried About Running Out of Food in the Last Year: Never true    Ran Out of Food in the Last Year: Never true  Transportation Needs: No Transportation Needs (04/26/2023)   PRAPARE - Administrator, Civil Service (Medical): No    Lack of Transportation (Non-Medical): No  Physical Activity: Not on file  Stress: Not on file  Social Connections: Not on file  Intimate Partner Violence: Not At Risk (04/26/2023)   Humiliation, Afraid, Rape, and Kick questionnaire    Fear of Current or Ex-Partner: No    Emotionally Abused: No    Physically Abused: No    Sexually Abused: No    FAMILY HISTORY: Family History  Problem Relation Age of Onset    Asthma Mother    Allergic rhinitis Mother    Rheum arthritis Mother    Allergic rhinitis Father    Alzheimer's disease Father    Eczema Brother    Allergic rhinitis Brother    Colon cancer Neg Hx    Esophageal cancer Neg Hx    Stomach cancer Neg Hx    Rectal cancer Neg Hx     Review of Systems  Constitutional:  Negative for appetite change, chills, fatigue, fever and unexpected weight change.  HENT:   Negative for hearing loss, lump/mass and trouble swallowing.   Eyes:  Negative for eye problems and icterus.  Respiratory:  Negative for chest tightness, cough and shortness of breath.   Cardiovascular:  Negative for chest pain, leg swelling and palpitations.  Gastrointestinal:  Negative for abdominal distention, abdominal pain, constipation, diarrhea, nausea and vomiting.  Endocrine: Negative for hot flashes.  Genitourinary:  Negative for difficulty urinating.   Musculoskeletal:  Negative for arthralgias.  Skin:  Negative for itching and rash.  Neurological:  Negative for dizziness, extremity weakness, headaches and numbness.  Hematological:  Negative for adenopathy. Does not bruise/bleed easily.  Psychiatric/Behavioral:  Negative for depression. The patient is not nervous/anxious.       PHYSICAL EXAMINATION     Vitals:   07/01/23 0823  BP: (!) 120/58  Pulse: 79  Resp: 16  Temp: 97.7 F (36.5 C)  SpO2: 96%    HEENT: Oropharynx without lesions. CHEST: Breath sounds clear. Heart: Rate and rhythm regular. Systolic murmur heard. SKIN:  Rash stable Abdomen: Soft, nontender, nondistended No lower extremity edema, chronic venous stasis changes noted  LABORATORY DATA:  CBC    Component Value Date/Time   WBC 2.7 (L) 07/01/2023 0732   WBC 1.1 (L) 06/03/2023 1237   RBC 2.88 (L) 07/01/2023 0732   HGB 10.3 (L) 07/01/2023 0732   HCT 30.9 (L) 07/01/2023 0732   HCT 39.0 11/24/2022 1035   PLT 182 07/01/2023 0732   MCV 107.3 (H) 07/01/2023 0732   MCH 35.8 (H) 07/01/2023  0732   MCHC 33.3 07/01/2023 0732   RDW 23.2 (H) 07/01/2023 0732   LYMPHSABS PENDING 07/01/2023 0732   MONOABS PENDING 07/01/2023 0732   EOSABS PENDING 07/01/2023 0732   BASOSABS PENDING 07/01/2023 0732    CMP     Component Value Date/Time   NA 140 07/01/2023 0732   K 4.0 07/01/2023 0732   CL 106 07/01/2023 0732   CO2 27 07/01/2023 0732   GLUCOSE 82 07/01/2023 0732   BUN 18 07/01/2023 0732   CREATININE 0.50 07/01/2023 0732   CALCIUM 9.1 07/01/2023 0732   PROT 7.5 07/01/2023 0732   ALBUMIN 4.1 07/01/2023 0732   AST 18 07/01/2023 0732   ALT 9 07/01/2023 0732   ALKPHOS 53 07/01/2023 0732   BILITOT 0.3 07/01/2023 0732   GFRNONAA >60 07/01/2023 0732    ASSESSMENT and THERAPY PLAN:   MDS (myelodysplastic syndrome), high grade (HCC) Myelodysplastic Syndrome Improved blood counts with Decitabine  treatment. No significant side effects reported except for a rash initially. Patient requires blood transfusion approximately once a month. -Continue Decitabine  treatment for two more cycles. -Plan for bone marrow biopsy in late February or early March to assess disease status. -If remission is achieved, continue Decitabine  as maintenance therapy.  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 Rheumatologist has concerns about continuing Simponi Aria infusions due to antibiotic use. -Wait for differential blood count results to decide on continuation of antibiotic and antifungal therapy. -Communicate with rheumatologist to clarify that antibiotic use is prophylactic and not for active infection.  Constipation Patient reports constipation and uses Miralax  for relief. -Continue current management with Miralax .  Follow-up Plan for next visit in one month.      All questions were answered. The patient knows to call the clinic with any problems, questions or concerns. We can certainly see the patient much  sooner if necessary.  Total encounter time:30 minutes*in face-to-face visit time, chart review, lab review, care coordination, order entry, and documentation of the encounter time.    *Total Encounter Time as defined by the Centers for Medicare and Medicaid Services includes, in addition to the face-to-face time of a patient visit (documented in the note above) non-face-to-face time: obtaining and reviewing outside history, ordering and reviewing medications, tests or procedures, care coordination (communications with other health care professionals or caregivers) and documentation in the medical record.

## 2023-07-01 NOTE — Telephone Encounter (Signed)
----   Message ----- From: Loretha Ash, MD Sent: 07/01/2023   8:41 AM EST To: Berwyn SHAUNNA Overly, RN  Ms [facility] says that the rheumatologist Dr Ishmael is reluctant to give Symphoria ani, one of her infusions because she is on abx. She is on prophylaxis if they need to know.  Thanks,   This message will be forwarded to Dr Jon Learn Rheumatology

## 2023-07-01 NOTE — Patient Instructions (Addendum)
 [facility] - A DEPT OF [facility]  Discharge Instructions: Thank you for choosing [facility] to provide your oncology and hematology care.   If you have a lab appointment with the Cancer Center, please go directly to the Cancer Center and check in at the registration area.   Wear comfortable clothing and clothing appropriate for easy access to any Portacath or PICC line.   We strive to give you quality time with your provider. You may need to reschedule your appointment if you arrive late (15 or more minutes).  Arriving late affects you and other patients whose appointments are after yours.  Also, if you miss three or more appointments without notifying the office, you may be dismissed from the clinic at the provider's discretion.      For prescription refill requests, have your pharmacy contact our office and allow 72 hours for refills to be completed.    Today you received the following chemotherapy and/or immunotherapy agents: Dacogen      To help prevent nausea and vomiting after your treatment, we encourage you to take your nausea medication as directed.  BELOW ARE SYMPTOMS THAT SHOULD BE REPORTED IMMEDIATELY: *FEVER GREATER THAN 100.4 F (38 C) OR HIGHER *CHILLS OR SWEATING *NAUSEA AND VOMITING THAT IS NOT CONTROLLED WITH YOUR NAUSEA MEDICATION *UNUSUAL SHORTNESS OF BREATH *UNUSUAL BRUISING OR BLEEDING *URINARY PROBLEMS (pain or burning when urinating, or frequent urination) *BOWEL PROBLEMS (unusual diarrhea, constipation, pain near the anus) TENDERNESS IN MOUTH AND THROAT WITH OR WITHOUT PRESENCE OF ULCERS (sore throat, sores in mouth, or a toothache) UNUSUAL RASH, SWELLING OR PAIN  UNUSUAL VAGINAL DISCHARGE OR ITCHING   Items with * indicate a potential emergency and should be followed up as soon as possible or go to the Emergency Department if any problems should occur.  Please show the CHEMOTHERAPY ALERT CARD or IMMUNOTHERAPY  ALERT CARD at check-in to the Emergency Department and triage nurse.  Should you have questions after your visit or need to cancel or reschedule your appointment, please contact [facility] - A DEPT OF [facility]  Dept: [phone]  and follow the prompts.  Office hours are 8:00 a.m. to 4:30 p.m. Monday - Friday. Please note that voicemails left after 4:00 p.m. may not be returned until the following business day.  We are closed weekends and major holidays. You have access to a nurse at all times for urgent questions. Please call the main number to the clinic Dept: [phone] and follow the prompts.   For any non-urgent questions, you may also contact your provider using MyChart. We now offer e-Visits for anyone 15 and older to request care online for non-urgent symptoms. For details visit [email or web address].   Also download the MyChart app! Go to the app store, search "MyChart", open the app, select [place], and log in with your MyChart username and password.  Neutropenia Neutropenia is a condition that occurs when you have low levels of neutrophils. Neutrophils are a type of white blood cells. They are made in the spongy center of bones (bone marrow). They fight infections. Neutrophils are your body's main defense against infections. The fewer neutrophils you have and the longer your body remains without them, the greater your risk of getting a severe infection. What are the causes? This condition can occur if your body uses up or destroys neutrophils faster than your bone marrow can make them. Neutropenia may  be caused by: A bacterial or fungal infection. Allergic disorders. Reactions to some medicines. An autoimmune disease. An enlarged spleen. This condition can also occur if your bone marrow does not produce enough neutrophils. This problem may be caused by: Cancer. Cancer treatments, such as radiation or chemotherapy. Viral  infections. Medicines, such as phenytoin. Vitamin B12 deficiency. Diseases of the bone marrow. Environmental toxins, such as insecticides. What are the signs or symptoms? This condition does not usually cause symptoms. If symptoms are present, they are usually caused by an underlying infection. Symptoms of an infection may include: Fever. Chills. Swollen glands. Mouth ulcers. Cough. Rash or skin infection. Skin may be red, swollen, or painful. Abdominal or rectal pain. Frequent urination or pain or burning with urination. Because neutropenia weakens the immune system, symptoms of infection may be reduced. It is important to be aware of any changes in your body and talk to your health care provider. How is this diagnosed? This condition is diagnosed based on your medical history and a physical exam. Tests will also be done, such as: A complete blood count (CBC). Bone marrow biopsy. This is collecting a sample of bone marrow for testing. A chest X-ray. A urine culture. A blood culture. How is this treated? Treatment depends on the underlying cause and severity of your condition. Mild neutropenia may not require treatment. Treatment may include medicines, such as: Antibiotic medicine given through an IV. Antiviral medicines. Antifungal medicines. A medicine to increase production of neutrophils (colony-stimulating factor). You may get this medicine through an IV or by injection. Steroids given through an IV. If an underlying condition is causing neutropenia, you may need treatment for that condition. If medicines or cancer treatments are causing neutropenia, your health care provider may have you stop the medicines or treatment. Follow these instructions at home: Medicines  Take over-the-counter and prescription medicines only as told by your health care provider. Get an annual flu shot. Ask your health care provider whether you or anyone you live with needs any other  vaccines. Eating and drinking Do not share food utensils. Do not eat unpasteurized foods. Do not eat raw or undercooked meat, eggs, or seafood. Do not eat unwashed, raw fruits or vegetables. Lifestyle Avoid exposure to groups of people or children. Avoid being around people who are sick. Avoid being around live plants or fresh flowers. Avoid being around dirt or dust, such as in construction areas or gardens. Wear gloves if you are going to do yard work or gardening. Do not provide direct care for pets. Avoid animal droppings. Do not clean litter boxes and bird cages. Do not have sex unless your health care provider has approved. Hygiene  Bathe daily. Clean the area between the genitals and the anus (perineal area) after you urinate or have a bowel movement. If you are female, wipe from front to back. Get regular dental care and brush your teeth with a soft toothbrush before and after meals. Do not use a regular razor. Use an electric razor to remove hair. Wash your hands often with soap and water for at least 20 seconds. Make sure others who come in contact with you also wash their hands. If soap and water are not available, use hand sanitizer. General instructions Take steps to reduce your risk of injury or infection. Follow any precautions as told by your health care provider. Take actions to avoid cuts and burns. For example: Be cautious when you use knives. Always cut away from yourself. Keep knives in  protective sheaths or guards when not in use. Use oven mitts when you cook with a hot stove, oven, or grill. Stand a safe distance away from open fires. Do not use tampons, enemas, or rectal suppositories unless your health care provider has approved. Keep all follow-up visits. This is important. Contact a health care provider if: You have a cough. You have a sore throat. You develop sores in your mouth or anus. You have a warm, red, or tender area on your skin. You have red  streaks on the skin. You develop a rash. You have swollen lymph nodes. You have frequent or painful urination. You have vaginal discharge or itching. Get help right away if: You have a fever. You have chills or shaking. You have nausea or vomiting. You have a lot of fatigue. You have shortness of breath. Summary Neutropenia is a condition that occurs when you have a lower-than-normal level of a type of white blood cell (neutrophils) in your body. This condition can occur if your body uses up or destroys neutrophils faster than your bone marrow can make them. Treatment depends on the underlying cause and severity of your condition. Mild neutropenia may not require treatment. Follow any precautions as told by your health care provider to reduce your risk for injury or infection. This information is not intended to replace advice given to you by your health care provider. Make sure you discuss any questions you have with your health care provider. Document Revised: 11/30/2020 Document Reviewed: 11/30/2020 Elsevier Patient Education  2024 [employer].

## 2023-07-02 ENCOUNTER — Inpatient Hospital Stay: Payer: Medicare Other

## 2023-07-02 VITALS — BP 112/68 | HR 68 | Temp 98.2°F | Resp 17

## 2023-07-02 DIAGNOSIS — Z5111 Encounter for antineoplastic chemotherapy: Secondary | ICD-10-CM | POA: Diagnosis not present

## 2023-07-02 DIAGNOSIS — D46Z Other myelodysplastic syndromes: Secondary | ICD-10-CM

## 2023-07-02 MED ORDER — SODIUM CHLORIDE 0.9 % IV SOLN: Freq: Once | INTRAVENOUS | Status: AC

## 2023-07-02 MED ORDER — PROCHLORPERAZINE MALEATE 10 MG PO TABS
10.0000 mg | ORAL_TABLET | Freq: Once | ORAL | Status: AC
  Administered 2023-07-02: 10 mg via ORAL
  Filled 2023-07-02: qty 1

## 2023-07-02 MED ORDER — DECITABINE CHEMO INJECTION 50 MG
20.0000 mg/m2 | Freq: Once | INTRAVENOUS | Status: AC
  Administered 2023-07-02: 40 mg via INTRAVENOUS
  Filled 2023-07-02: qty 8

## 2023-07-03 ENCOUNTER — Inpatient Hospital Stay: Payer: Medicare Other

## 2023-07-03 VITALS — BP 128/72 | HR 66 | Temp 98.5°F | Resp 16

## 2023-07-03 DIAGNOSIS — D46Z Other myelodysplastic syndromes: Secondary | ICD-10-CM

## 2023-07-03 DIAGNOSIS — Z5111 Encounter for antineoplastic chemotherapy: Secondary | ICD-10-CM | POA: Diagnosis not present

## 2023-07-03 MED ORDER — SODIUM CHLORIDE 0.9 % IV SOLN
20.0000 mg/m2 | Freq: Once | INTRAVENOUS | Status: AC
  Administered 2023-07-03: 40 mg via INTRAVENOUS
  Filled 2023-07-03: qty 8

## 2023-07-03 MED ORDER — SODIUM CHLORIDE 0.9 % IV SOLN: Freq: Once | INTRAVENOUS | Status: AC

## 2023-07-03 MED ORDER — HEPARIN SOD (PORK) LOCK FLUSH 100 UNIT/ML IV SOLN
500.0000 [IU] | Freq: Once | INTRAVENOUS | Status: AC | PRN
  Administered 2023-07-03: 500 [IU]

## 2023-07-03 MED ORDER — PROCHLORPERAZINE MALEATE 10 MG PO TABS
10.0000 mg | ORAL_TABLET | Freq: Once | ORAL | Status: AC
  Administered 2023-07-03: 10 mg via ORAL
  Filled 2023-07-03: qty 1

## 2023-07-03 MED ORDER — SODIUM CHLORIDE 0.9% FLUSH
10.0000 mL | INTRAVENOUS | Status: DC | PRN
  Administered 2023-07-03: 10 mL

## 2023-07-03 NOTE — Patient Instructions (Signed)
 [facility]  Discharge Instructions: Thank you for choosing [facility] to provide your oncology and hematology care.   If you have a lab appointment with the Cancer Center, please go directly to the Cancer Center and check in at the registration area.   Wear comfortable clothing and clothing appropriate for easy access to any Portacath or PICC line.   We strive to give you quality time with your provider. You may need to reschedule your appointment if you arrive late (15 or more minutes).  Arriving late affects you and other patients whose appointments are after yours.  Also, if you miss three or more appointments without notifying the office, you may be dismissed from the clinic at the provider's discretion.      For prescription refill requests, have your pharmacy contact our office and allow 72 hours for refills to be completed.    Today you received the following chemotherapy and/or immunotherapy agents: Dacogen      To help prevent nausea and vomiting after your treatment, we encourage you to take your nausea medication as directed.  BELOW ARE SYMPTOMS THAT SHOULD BE REPORTED IMMEDIATELY: *FEVER GREATER THAN 100.4 F (38 C) OR HIGHER *CHILLS OR SWEATING *NAUSEA AND VOMITING THAT IS NOT CONTROLLED WITH YOUR NAUSEA MEDICATION *UNUSUAL SHORTNESS OF BREATH *UNUSUAL BRUISING OR BLEEDING *URINARY PROBLEMS (pain or burning when urinating, or frequent urination) *BOWEL PROBLEMS (unusual diarrhea, constipation, pain near the anus) TENDERNESS IN MOUTH AND THROAT WITH OR WITHOUT PRESENCE OF ULCERS (sore throat, sores in mouth, or a toothache) UNUSUAL RASH, SWELLING OR PAIN  UNUSUAL VAGINAL DISCHARGE OR ITCHING   Items with * indicate a potential emergency and should be followed up as soon as possible or go to the Emergency Department if any problems should occur.  Please show the CHEMOTHERAPY ALERT CARD or IMMUNOTHERAPY  ALERT CARD at check-in to the Emergency Department and triage nurse.  Should you have questions after your visit or need to cancel or reschedule your appointment, please contact [facility]  Dept: [phone]  and follow the prompts.  Office hours are 8:00 a.m. to 4:30 p.m. Monday - Friday. Please note that voicemails left after 4:00 p.m. may not be returned until the following business day.  We are closed weekends and major holidays. You have access to a nurse at all times for urgent questions. Please call the main number to the clinic Dept: [phone] and follow the prompts.   For any non-urgent questions, you may also contact your provider using MyChart. We now offer e-Visits for anyone 15 and older to request care online for non-urgent symptoms. For details visit [email or web address].   Also download the MyChart app! Go to the app store, search "MyChart", open the app, select [place], and log in with your MyChart username and password.  Neutropenia Neutropenia is a condition that occurs when you have low levels of neutrophils. Neutrophils are a type of white blood cells. They are made in the spongy center of bones (bone marrow). They fight infections. Neutrophils are your body's main defense against infections. The fewer neutrophils you have and the longer your body remains without them, the greater your risk of getting a severe infection. What are the causes? This condition can occur if your body uses up or destroys neutrophils faster than your bone marrow can make them. Neutropenia may  be caused by: A bacterial or fungal infection. Allergic disorders. Reactions to some medicines. An autoimmune disease. An enlarged spleen. This condition can also occur if your bone marrow does not produce enough neutrophils. This problem may be caused by: Cancer. Cancer treatments, such as radiation or chemotherapy. Viral  infections. Medicines, such as phenytoin. Vitamin B12 deficiency. Diseases of the bone marrow. Environmental toxins, such as insecticides. What are the signs or symptoms? This condition does not usually cause symptoms. If symptoms are present, they are usually caused by an underlying infection. Symptoms of an infection may include: Fever. Chills. Swollen glands. Mouth ulcers. Cough. Rash or skin infection. Skin may be red, swollen, or painful. Abdominal or rectal pain. Frequent urination or pain or burning with urination. Because neutropenia weakens the immune system, symptoms of infection may be reduced. It is important to be aware of any changes in your body and talk to your health care provider. How is this diagnosed? This condition is diagnosed based on your medical history and a physical exam. Tests will also be done, such as: A complete blood count (CBC). Bone marrow biopsy. This is collecting a sample of bone marrow for testing. A chest X-ray. A urine culture. A blood culture. How is this treated? Treatment depends on the underlying cause and severity of your condition. Mild neutropenia may not require treatment. Treatment may include medicines, such as: Antibiotic medicine given through an IV. Antiviral medicines. Antifungal medicines. A medicine to increase production of neutrophils (colony-stimulating factor). You may get this medicine through an IV or by injection. Steroids given through an IV. If an underlying condition is causing neutropenia, you may need treatment for that condition. If medicines or cancer treatments are causing neutropenia, your health care provider may have you stop the medicines or treatment. Follow these instructions at home: Medicines  Take over-the-counter and prescription medicines only as told by your health care provider. Get an annual flu shot. Ask your health care provider whether you or anyone you live with needs any other  vaccines. Eating and drinking Do not share food utensils. Do not eat unpasteurized foods. Do not eat raw or undercooked meat, eggs, or seafood. Do not eat unwashed, raw fruits or vegetables. Lifestyle Avoid exposure to groups of people or children. Avoid being around people who are sick. Avoid being around live plants or fresh flowers. Avoid being around dirt or dust, such as in construction areas or gardens. Wear gloves if you are going to do yard work or gardening. Do not provide direct care for pets. Avoid animal droppings. Do not clean litter boxes and bird cages. Do not have sex unless your health care provider has approved. Hygiene  Bathe daily. Clean the area between the genitals and the anus (perineal area) after you urinate or have a bowel movement. If you are female, wipe from front to back. Get regular dental care and brush your teeth with a soft toothbrush before and after meals. Do not use a regular razor. Use an electric razor to remove hair. Wash your hands often with soap and water for at least 20 seconds. Make sure others who come in contact with you also wash their hands. If soap and water are not available, use hand sanitizer. General instructions Take steps to reduce your risk of injury or infection. Follow any precautions as told by your health care provider. Take actions to avoid cuts and burns. For example: Be cautious when you use knives. Always cut away from yourself. Keep knives in  protective sheaths or guards when not in use. Use oven mitts when you cook with a hot stove, oven, or grill. Stand a safe distance away from open fires. Do not use tampons, enemas, or rectal suppositories unless your health care provider has approved. Keep all follow-up visits. This is important. Contact a health care provider if: You have a cough. You have a sore throat. You develop sores in your mouth or anus. You have a warm, red, or tender area on your skin. You have red  streaks on the skin. You develop a rash. You have swollen lymph nodes. You have frequent or painful urination. You have vaginal discharge or itching. Get help right away if: You have a fever. You have chills or shaking. You have nausea or vomiting. You have a lot of fatigue. You have shortness of breath. Summary Neutropenia is a condition that occurs when you have a lower-than-normal level of a type of white blood cell (neutrophils) in your body. This condition can occur if your body uses up or destroys neutrophils faster than your bone marrow can make them. Treatment depends on the underlying cause and severity of your condition. Mild neutropenia may not require treatment. Follow any precautions as told by your health care provider to reduce your risk for injury or infection. This information is not intended to replace advice given to you by your health care provider. Make sure you discuss any questions you have with your health care provider. Document Revised: 11/30/2020 Document Reviewed: 11/30/2020 Elsevier Patient Education  2024 [employer].

## 2023-07-04 ENCOUNTER — Inpatient Hospital Stay: Payer: Medicare Other

## 2023-07-04 VITALS — BP 129/66 | HR 71 | Temp 98.9°F | Resp 15 | Wt 192.1 lb

## 2023-07-04 DIAGNOSIS — D46Z Other myelodysplastic syndromes: Secondary | ICD-10-CM

## 2023-07-04 DIAGNOSIS — Z5111 Encounter for antineoplastic chemotherapy: Secondary | ICD-10-CM | POA: Diagnosis not present

## 2023-07-04 MED ORDER — HEPARIN SOD (PORK) LOCK FLUSH 100 UNIT/ML IV SOLN
500.0000 [IU] | Freq: Once | INTRAVENOUS | Status: AC | PRN
  Administered 2023-07-04: 500 [IU]

## 2023-07-04 MED ORDER — SODIUM CHLORIDE 0.9 % IV SOLN
20.0000 mg/m2 | Freq: Once | INTRAVENOUS | Status: AC
  Administered 2023-07-04: 40 mg via INTRAVENOUS
  Filled 2023-07-04: qty 8

## 2023-07-04 MED ORDER — SODIUM CHLORIDE 0.9% FLUSH
10.0000 mL | INTRAVENOUS | Status: DC | PRN
  Administered 2023-07-04: 10 mL

## 2023-07-04 MED ORDER — SODIUM CHLORIDE 0.9 % IV SOLN: Freq: Once | INTRAVENOUS | Status: AC

## 2023-07-04 MED ORDER — PROCHLORPERAZINE MALEATE 10 MG PO TABS
10.0000 mg | ORAL_TABLET | Freq: Once | ORAL | Status: AC
Start: 2023-07-04 — End: 2023-07-04
  Administered 2023-07-04: 10 mg via ORAL
  Filled 2023-07-04: qty 1

## 2023-07-04 NOTE — Patient Instructions (Signed)
[facility]  Discharge Instructions: Thank you for choosing [facility] to provide your oncology and hematology care.   If you have a lab appointment with the Cancer Center, please go directly to the Cancer Center and check in at the registration area.   Wear comfortable clothing and clothing appropriate for easy access to any Portacath or PICC line.   We strive to give you quality time with your provider. You may need to reschedule your appointment if you arrive late (15 or more minutes).  Arriving late affects you and other patients whose appointments are after yours.  Also, if you miss three or more appointments without notifying the office, you may be dismissed from the clinic at the provider's discretion.      For prescription refill requests, have your pharmacy contact our office and allow 72 hours for refills to be completed.    Today you received the following chemotherapy and/or immunotherapy agents: Dacogen      To help prevent nausea and vomiting after your treatment, we encourage you to take your nausea medication as directed.  BELOW ARE SYMPTOMS THAT SHOULD BE REPORTED IMMEDIATELY: *FEVER GREATER THAN 100.4 F (38 C) OR HIGHER *CHILLS OR SWEATING *NAUSEA AND VOMITING THAT IS NOT CONTROLLED WITH YOUR NAUSEA MEDICATION *UNUSUAL SHORTNESS OF BREATH *UNUSUAL BRUISING OR BLEEDING *URINARY PROBLEMS (pain or burning when urinating, or frequent urination) *BOWEL PROBLEMS (unusual diarrhea, constipation, pain near the anus) TENDERNESS IN MOUTH AND THROAT WITH OR WITHOUT PRESENCE OF ULCERS (sore throat, sores in mouth, or a toothache) UNUSUAL RASH, SWELLING OR PAIN  UNUSUAL VAGINAL DISCHARGE OR ITCHING   Items with * indicate a potential emergency and should be followed up as soon as possible or go to the Emergency Department if any problems should occur.  Please show the CHEMOTHERAPY ALERT CARD or IMMUNOTHERAPY  ALERT CARD at check-in to the Emergency Department and triage nurse.  Should you have questions after your visit or need to cancel or reschedule your appointment, please contact [facility]  Dept: [phone]  and follow the prompts.  Office hours are 8:00 a.m. to 4:30 p.m. Monday - Friday. Please note that voicemails left after 4:00 p.m. may not be returned until the following business day.  We are closed weekends and major holidays. You have access to a nurse at all times for urgent questions. Please call the main number to the clinic Dept: [phone] and follow the prompts.   For any non-urgent questions, you may also contact your provider using MyChart. We now offer e-Visits for anyone 15 and older to request care online for non-urgent symptoms. For details visit [email or web address].   Also download the MyChart app! Go to the app store, search "MyChart", open the app, select [facility], and log in with your MyChart username and password.  Neutropenia Neutropenia is a condition that occurs when you have low levels of neutrophils. Neutrophils are a type of white blood cells. They are made in the spongy center of bones (bone marrow). They fight infections. Neutrophils are your body's main defense against infections. The fewer neutrophils you have and the longer your body remains without them, the greater your risk of getting a severe infection. What are the causes? This condition can occur if your body uses up or destroys neutrophils faster than your bone marrow can make them. Neutropenia may  be caused by: A bacterial or fungal infection. Allergic disorders. Reactions to some medicines. An autoimmune disease. An enlarged spleen. This condition can also occur if your bone marrow does not produce enough neutrophils. This problem may be caused by: Cancer. Cancer treatments, such as radiation or chemotherapy. Viral  infections. Medicines, such as phenytoin. Vitamin B12 deficiency. Diseases of the bone marrow. Environmental toxins, such as insecticides. What are the signs or symptoms? This condition does not usually cause symptoms. If symptoms are present, they are usually caused by an underlying infection. Symptoms of an infection may include: Fever. Chills. Swollen glands. Mouth ulcers. Cough. Rash or skin infection. Skin may be red, swollen, or painful. Abdominal or rectal pain. Frequent urination or pain or burning with urination. Because neutropenia weakens the immune system, symptoms of infection may be reduced. It is important to be aware of any changes in your body and talk to your health care provider. How is this diagnosed? This condition is diagnosed based on your medical history and a physical exam. Tests will also be done, such as: A complete blood count (CBC). Bone marrow biopsy. This is collecting a sample of bone marrow for testing. A chest X-ray. A urine culture. A blood culture. How is this treated? Treatment depends on the underlying cause and severity of your condition. Mild neutropenia may not require treatment. Treatment may include medicines, such as: Antibiotic medicine given through an IV. Antiviral medicines. Antifungal medicines. A medicine to increase production of neutrophils (colony-stimulating factor). You may get this medicine through an IV or by injection. Steroids given through an IV. If an underlying condition is causing neutropenia, you may need treatment for that condition. If medicines or cancer treatments are causing neutropenia, your health care provider may have you stop the medicines or treatment. Follow these instructions at home: Medicines  Take over-the-counter and prescription medicines only as told by your health care provider. Get an annual flu shot. Ask your health care provider whether you or anyone you live with needs any other  vaccines. Eating and drinking Do not share food utensils. Do not eat unpasteurized foods. Do not eat raw or undercooked meat, eggs, or seafood. Do not eat unwashed, raw fruits or vegetables. Lifestyle Avoid exposure to groups of people or children. Avoid being around people who are sick. Avoid being around live plants or fresh flowers. Avoid being around dirt or dust, such as in construction areas or gardens. Wear gloves if you are going to do yard work or gardening. Do not provide direct care for pets. Avoid animal droppings. Do not clean litter boxes and bird cages. Do not have sex unless your health care provider has approved. Hygiene  Bathe daily. Clean the area between the genitals and the anus (perineal area) after you urinate or have a bowel movement. If you are female, wipe from front to back. Get regular dental care and brush your teeth with a soft toothbrush before and after meals. Do not use a regular razor. Use an electric razor to remove hair. Wash your hands often with soap and water for at least 20 seconds. Make sure others who come in contact with you also wash their hands. If soap and water are not available, use hand sanitizer. General instructions Take steps to reduce your risk of injury or infection. Follow any precautions as told by your health care provider. Take actions to avoid cuts and burns. For example: Be cautious when you use knives. Always cut away from yourself. Keep knives in  protective sheaths or guards when not in use. Use oven mitts when you cook with a hot stove, oven, or grill. Stand a safe distance away from open fires. Do not use tampons, enemas, or rectal suppositories unless your health care provider has approved. Keep all follow-up visits. This is important. Contact a health care provider if: You have a cough. You have a sore throat. You develop sores in your mouth or anus. You have a warm, red, or tender area on your skin. You have red  streaks on the skin. You develop a rash. You have swollen lymph nodes. You have frequent or painful urination. You have vaginal discharge or itching. Get help right away if: You have a fever. You have chills or shaking. You have nausea or vomiting. You have a lot of fatigue. You have shortness of breath. Summary Neutropenia is a condition that occurs when you have a lower-than-normal level of a type of white blood cell (neutrophils) in your body. This condition can occur if your body uses up or destroys neutrophils faster than your bone marrow can make them. Treatment depends on the underlying cause and severity of your condition. Mild neutropenia may not require treatment. Follow any precautions as told by your health care provider to reduce your risk for injury or infection. This information is not intended to replace advice given to you by your health care provider. Make sure you discuss any questions you have with your health care provider. Document Revised: 11/30/2020 Document Reviewed: 11/30/2020 Elsevier Patient Education  2024 [employer].

## 2023-07-05 ENCOUNTER — Inpatient Hospital Stay: Payer: Medicare Other

## 2023-07-05 VITALS — BP 128/68 | HR 72 | Temp 98.2°F | Resp 18

## 2023-07-05 DIAGNOSIS — Z5111 Encounter for antineoplastic chemotherapy: Secondary | ICD-10-CM | POA: Diagnosis not present

## 2023-07-05 DIAGNOSIS — D46Z Other myelodysplastic syndromes: Secondary | ICD-10-CM

## 2023-07-05 MED ORDER — SODIUM CHLORIDE 0.9 % IV SOLN: Freq: Once | INTRAVENOUS | Status: AC

## 2023-07-05 MED ORDER — SODIUM CHLORIDE 0.9 % IV SOLN
20.0000 mg/m2 | Freq: Once | INTRAVENOUS | Status: AC
  Administered 2023-07-05: 40 mg via INTRAVENOUS
  Filled 2023-07-05: qty 8

## 2023-07-05 MED ORDER — PROCHLORPERAZINE MALEATE 10 MG PO TABS
10.0000 mg | ORAL_TABLET | Freq: Once | ORAL | Status: AC
Start: 2023-07-05 — End: 2023-07-05
  Administered 2023-07-05: 10 mg via ORAL

## 2023-07-12 ENCOUNTER — Encounter: Payer: Self-pay | Admitting: Hematology and Oncology

## 2023-07-24 ENCOUNTER — Encounter: Payer: Self-pay | Admitting: Hematology and Oncology

## 2023-07-29 ENCOUNTER — Encounter: Payer: Self-pay | Admitting: Hematology and Oncology

## 2023-07-29 ENCOUNTER — Telehealth: Payer: Self-pay | Admitting: Pharmacy Technician

## 2023-07-29 ENCOUNTER — Inpatient Hospital Stay: Payer: Medicare Other | Attending: Hematology and Oncology

## 2023-07-29 ENCOUNTER — Other Ambulatory Visit (HOSPITAL_COMMUNITY): Payer: Self-pay

## 2023-07-29 ENCOUNTER — Inpatient Hospital Stay: Payer: Medicare Other

## 2023-07-29 ENCOUNTER — Telehealth: Payer: Self-pay

## 2023-07-29 ENCOUNTER — Inpatient Hospital Stay (HOSPITAL_BASED_OUTPATIENT_CLINIC_OR_DEPARTMENT_OTHER): Payer: Medicare Other | Admitting: Hematology and Oncology

## 2023-07-29 VITALS — BP 142/64 | HR 75 | Temp 97.3°F | Resp 16 | Wt 194.9 lb

## 2023-07-29 DIAGNOSIS — Z9089 Acquired absence of other organs: Secondary | ICD-10-CM | POA: Diagnosis not present

## 2023-07-29 DIAGNOSIS — Z8261 Family history of arthritis: Secondary | ICD-10-CM | POA: Insufficient documentation

## 2023-07-29 DIAGNOSIS — D469 Myelodysplastic syndrome, unspecified: Secondary | ICD-10-CM | POA: Diagnosis present

## 2023-07-29 DIAGNOSIS — Z840 Family history of diseases of the skin and subcutaneous tissue: Secondary | ICD-10-CM | POA: Insufficient documentation

## 2023-07-29 DIAGNOSIS — Z818 Family history of other mental and behavioral disorders: Secondary | ICD-10-CM | POA: Diagnosis not present

## 2023-07-29 DIAGNOSIS — D696 Thrombocytopenia, unspecified: Secondary | ICD-10-CM | POA: Diagnosis present

## 2023-07-29 DIAGNOSIS — E039 Hypothyroidism, unspecified: Secondary | ICD-10-CM | POA: Diagnosis not present

## 2023-07-29 DIAGNOSIS — Z79899 Other long term (current) drug therapy: Secondary | ICD-10-CM | POA: Diagnosis not present

## 2023-07-29 DIAGNOSIS — Z5111 Encounter for antineoplastic chemotherapy: Secondary | ICD-10-CM | POA: Diagnosis present

## 2023-07-29 DIAGNOSIS — D46Z Other myelodysplastic syndromes: Secondary | ICD-10-CM

## 2023-07-29 DIAGNOSIS — Z91030 Bee allergy status: Secondary | ICD-10-CM | POA: Insufficient documentation

## 2023-07-29 DIAGNOSIS — Z87891 Personal history of nicotine dependence: Secondary | ICD-10-CM | POA: Insufficient documentation

## 2023-07-29 DIAGNOSIS — J4540 Moderate persistent asthma, uncomplicated: Secondary | ICD-10-CM | POA: Diagnosis not present

## 2023-07-29 DIAGNOSIS — Z825 Family history of asthma and other chronic lower respiratory diseases: Secondary | ICD-10-CM | POA: Insufficient documentation

## 2023-07-29 LAB — CMP (CANCER CENTER ONLY)
ALT: 9 U/L (ref 0–44)
AST: 14 U/L — ABNORMAL LOW (ref 15–41)
Albumin: 4 g/dL (ref 3.5–5.0)
Alkaline Phosphatase: 48 U/L (ref 38–126)
Anion gap: 4 — ABNORMAL LOW (ref 5–15)
BUN: 15 mg/dL (ref 8–23)
CO2: 26 mmol/L (ref 22–32)
Calcium: 9.1 mg/dL (ref 8.9–10.3)
Chloride: 106 mmol/L (ref 98–111)
Creatinine: 0.4 mg/dL — ABNORMAL LOW (ref 0.44–1.00)
GFR, Estimated: >60 mL/min (ref >60)
Glucose, Bld: 106 mg/dL — ABNORMAL HIGH (ref 70–99)
Potassium: 4 mmol/L (ref 3.5–5.1)
Sodium: 136 mmol/L (ref 135–145)
Total Bilirubin: 0.3 mg/dL (ref 0.0–1.2)
Total Protein: 7.1 g/dL (ref 6.5–8.1)

## 2023-07-29 LAB — CBC WITH DIFFERENTIAL (CANCER CENTER ONLY)
Abs Immature Granulocytes: 0.01 10*3/uL (ref 0.00–0.07)
Basophils Absolute: 0 10*3/uL (ref 0.0–0.1)
Basophils Relative: 1 %
Eosinophils Absolute: 0 10*3/uL (ref 0.0–0.5)
Eosinophils Relative: 0 %
HCT: 31.1 % — ABNORMAL LOW (ref 36.0–46.0)
Hemoglobin: 10.3 g/dL — ABNORMAL LOW (ref 12.0–15.0)
Immature Granulocytes: 1 %
Lymphocytes Relative: 79 %
Lymphs Abs: 0.8 10*3/uL (ref 0.7–4.0)
MCH: 37.2 pg — ABNORMAL HIGH (ref 26.0–34.0)
MCHC: 33.1 g/dL (ref 30.0–36.0)
MCV: 112.3 fL — ABNORMAL HIGH (ref 80.0–100.0)
Monocytes Absolute: 0.1 10*3/uL (ref 0.1–1.0)
Monocytes Relative: 10 %
Neutro Abs: 0.1 10*3/uL — CL (ref 1.7–7.7)
Neutrophils Relative %: 9 %
Platelet Count: 157 10*3/uL (ref 150–400)
RBC: 2.77 MIL/uL — ABNORMAL LOW (ref 3.87–5.11)
RDW: 19.8 % — ABNORMAL HIGH (ref 11.5–15.5)
WBC Count: 1 10*3/uL — ABNORMAL LOW (ref 4.0–10.5)
nRBC: 0 % (ref 0.0–0.2)

## 2023-07-29 MED ORDER — SODIUM CHLORIDE 0.9 % IV SOLN
20.0000 mg/m2 | Freq: Once | INTRAVENOUS | Status: AC
  Administered 2023-07-29: 40 mg via INTRAVENOUS
  Filled 2023-07-29: qty 8

## 2023-07-29 MED ORDER — HEPARIN SOD (PORK) LOCK FLUSH 100 UNIT/ML IV SOLN
500.0000 [IU] | Freq: Once | INTRAVENOUS | Status: AC | PRN
  Administered 2023-07-29: 500 [IU]

## 2023-07-29 MED ORDER — ENASIDENIB MESYLATE 100 MG PO TABS
100.0000 mg | ORAL_TABLET | Freq: Every day | ORAL | 3 refills | Status: DC
Start: 2023-07-29 — End: 2023-09-11
  Filled 2023-07-31: qty 30, 30d supply, fill #0
  Filled 2023-08-19: qty 30, 30d supply, fill #1

## 2023-07-29 MED ORDER — SODIUM CHLORIDE 0.9 % IV SOLN: Freq: Once | INTRAVENOUS | Status: AC

## 2023-07-29 MED ORDER — CIPROFLOXACIN HCL 500 MG PO TABS
500.0000 mg | ORAL_TABLET | Freq: Two times a day (BID) | ORAL | 1 refills | Status: DC
  Filled 2023-07-29 – 2023-08-01 (×2): qty 60, 30d supply, fill #0

## 2023-07-29 MED ORDER — VORICONAZOLE 200 MG PO TABS
200.0000 mg | ORAL_TABLET | Freq: Two times a day (BID) | ORAL | 1 refills | Status: DC
  Filled 2023-07-29 – 2023-08-01 (×2): qty 60, 30d supply, fill #0

## 2023-07-29 MED ORDER — SODIUM CHLORIDE 0.9% FLUSH
10.0000 mL | Freq: Once | INTRAVENOUS | Status: AC
  Administered 2023-07-29: 10 mL

## 2023-07-29 MED ORDER — SODIUM CHLORIDE 0.9% FLUSH
10.0000 mL | INTRAVENOUS | Status: DC | PRN
  Administered 2023-07-29: 10 mL

## 2023-07-29 MED ORDER — PROCHLORPERAZINE MALEATE 10 MG PO TABS
10.0000 mg | ORAL_TABLET | Freq: Once | ORAL | Status: AC
  Administered 2023-07-29: 10 mg via ORAL
  Filled 2023-07-29: qty 1

## 2023-07-29 NOTE — Patient Instructions (Signed)
 [facility]  Discharge Instructions: Thank you for choosing [facility] to provide your oncology and hematology care.   If you have a lab appointment with the Cancer Center, please go directly to the Cancer Center and check in at the registration area.   Wear comfortable clothing and clothing appropriate for easy access to any Portacath or PICC line.   We strive to give you quality time with your provider. You may need to reschedule your appointment if you arrive late (15 or more minutes).  Arriving late affects you and other patients whose appointments are after yours.  Also, if you miss three or more appointments without notifying the office, you may be dismissed from the clinic at the provider's discretion.      For prescription refill requests, have your pharmacy contact our office and allow 72 hours for refills to be completed.    Today you received the following chemotherapy and/or immunotherapy agents: Decitabine.       To help prevent nausea and vomiting after your treatment, we encourage you to take your nausea medication as directed.  BELOW ARE SYMPTOMS THAT SHOULD BE REPORTED IMMEDIATELY: *FEVER GREATER THAN 100.4 F (38 C) OR HIGHER *CHILLS OR SWEATING *NAUSEA AND VOMITING THAT IS NOT CONTROLLED WITH YOUR NAUSEA MEDICATION *UNUSUAL SHORTNESS OF BREATH *UNUSUAL BRUISING OR BLEEDING *URINARY PROBLEMS (pain or burning when urinating, or frequent urination) *BOWEL PROBLEMS (unusual diarrhea, constipation, pain near the anus) TENDERNESS IN MOUTH AND THROAT WITH OR WITHOUT PRESENCE OF ULCERS (sore throat, sores in mouth, or a toothache) UNUSUAL RASH, SWELLING OR PAIN  UNUSUAL VAGINAL DISCHARGE OR ITCHING   Items with * indicate a potential emergency and should be followed up as soon as possible or go to the Emergency Department if any problems should occur.  Please show the CHEMOTHERAPY ALERT CARD or  IMMUNOTHERAPY ALERT CARD at check-in to the Emergency Department and triage nurse.  Should you have questions after your visit or need to cancel or reschedule your appointment, please contact [facility]  Dept: [phone]  and follow the prompts.  Office hours are 8:00 a.m. to 4:30 p.m. Monday - Friday. Please note that voicemails left after 4:00 p.m. may not be returned until the following business day.  We are closed weekends and major holidays. You have access to a nurse at all times for urgent questions. Please call the main number to the clinic Dept: [phone] and follow the prompts.   For any non-urgent questions, you may also contact your provider using MyChart. We now offer e-Visits for anyone 43 and older to request care online for non-urgent symptoms. For details visit [email or web address].   Also download the MyChart app! Go to the app store, search "MyChart", open the app, select [facility], and log in with your MyChart username and password.

## 2023-07-29 NOTE — Progress Notes (Signed)
 [facility] Cancer Follow up:    [name], [name], MD [address] [place] [address]   DIAGNOSIS: Myelodysplastic Syndrome  SUMMARY OF ONCOLOGIC HISTORY: Oncology History  MDS (myelodysplastic syndrome), high grade (HCC)  01/07/2023 Initial Biopsy   Bone marrow biopsy:cellular bone marrow with focal areas of hypercellularity (approximately up to  50%).  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  Blasts appear mildly mildly increased, approximately 5% to focally up to 10%.  Flow cytometric analysis reveals 5% CD34 positive blasts.  While the findings could be attributable to the patient's coexisting conditions, the possibility of a myeloid neoplasm such as myelodysplastic neoplasm with increased blasts cannot be entirely excluded. normal karyotype   01/29/2023 Initial Diagnosis   MDS (myelodysplastic syndrome), high grade (HCC)   02/04/2023 - 02/06/2023 Chemotherapy   Patient is on Treatment Plan : MYELODYSPLASIA  Azacitidine  IV D1-5 q28d     03/04/2023 -  Chemotherapy   Patient is on Treatment Plan : MYELODYSPLASIA Decitabine  D1-5 q28d       CURRENT THERAPY: Azacitidine   [name] [name] 78 y.o. female returns for follow-up while on decitabine .  Discussed the use of AI scribe software for clinical note transcription with the patient, who gave verbal consent to proceed.  History of Present Illness    Discussed the use of AI scribe software for clinical note transcription with the patient, who gave verbal consent to proceed.  History of Present Illness     [name] [name] is a 78 year old female with myelodysplastic syndrome who presents for a follow-up regarding her treatment options.  She has myelodysplastic syndrome and has been previously treated with decitabine , resulting in improved hemoglobin levels from 7.8 to 10.3 and platelet counts from 30-40,000 to 157,000. Her energy levels have improved, and she no longer requires frequent  transfusions.  She is considering transitioning from infusion therapy to oral medication. She has a cousin who works in [employer] and they recommended enasidenib or venetoclax. She is hoping we can do this for her locally. She otherwise has no ongoing complaints for me today.  Rest of the pertinent 10 point ROS reviewed and neg.  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) 04/26/2023   Hypokalemia 04/26/2023   Neutropenic fever (HCC) 04/25/2023   Hives 03/18/2023   MDS (myelodysplastic syndrome), high grade (HCC) 01/29/2023   Obesity 03/02/2021   Other long term (current) drug therapy 03/02/2021   Primary osteoarthritis 03/02/2021   SOB (shortness of breath) 12/08/2020   Pericardial effusion 12/08/2020   Dry eyes/dry mouth 05/25/2019   Bronchitis, mucopurulent recurrent (HCC) 02/26/2019   Moderate persistent asthma without complication 02/24/2019   Perennial and seasonal allergic rhinitis 02/24/2019   Seasonal allergic conjunctivitis 02/24/2019   Primary osteoarthritis of both knees 04/10/2016   Lung nodule 08/19/2014   HTN (hypertension) 08/07/2012   HLD (hyperlipidemia) 09/26/2011   Asthma 09/06/2010   Depression 09/06/2010   History of allergy 09/06/2010   Hypothyroidism 09/06/2010   Lower back pain 09/06/2010   Arthritis 09/06/2010   Pain in joint, pelvic region and thigh 09/06/2010   Peripheral neuropathy 09/06/2010   Psoriasis 09/06/2010   Therapeutic drug monitoring 09/06/2010   Tinnitus 09/06/2010   Vitamin B 12 deficiency 09/06/2010   Cervical spondylosis without myelopathy 04/24/2006   Tear of lateral cartilage or meniscus of knee, current 03/13/2006    is allergic to bee pollen, latex, pollen extract-tree extract [  pollen extract], cat dander, dust mite extract, and molds & smuts.  MEDICAL HISTORY: Past Medical History:  Diagnosis Date   Asthma    Diverticulitis    Hypothyroid    MDS  (myelodysplastic syndrome) (HCC) 01/2023   Psoriatic arthritis (HCC)     SURGICAL HISTORY: Past Surgical History:  Procedure Laterality Date   COLONOSCOPY  08/2021   HIP SURGERY Bilateral    IR IMAGING GUIDED PORT INSERTION  02/20/2023   TONSILLECTOMY     TUBAL LIGATION      SOCIAL HISTORY: Social History   Socioeconomic History   Marital status: Widowed    Spouse name: Not on file   Number of children: Not on file   Years of education: Not on file   Highest education level: Not on file  Occupational History   Not on file  Tobacco Use   Smoking status: Former    Current packs/day: 0.00    Average packs/day: 0.1 packs/day for 33.4 years (3.3 ttl pk-yrs)    Types: Cigarettes    Start date: 62    Quit date: 11/18/2000    Years since quitting: 22.7   Smokeless tobacco: Never   Tobacco comments:    3 cigarettes a day  Vaping Use   Vaping status: Never Used  Substance and Sexual Activity   Alcohol use: Never   Drug use: Never   Sexual activity: Not Currently  Other Topics Concern   Not on file  Social History Narrative   Lives with children   Social Drivers of Health   Financial Resource Strain: Not on file  Food Insecurity: No Food Insecurity (04/26/2023)   Hunger Vital Sign    Worried About Running Out of Food in the Last Year: Never true    Ran Out of Food in the Last Year: Never true  Transportation Needs: No Transportation Needs (04/26/2023)   PRAPARE - Administrator, Civil Service (Medical): No    Lack of Transportation (Non-Medical): No  Physical Activity: Not on file  Stress: Not on file  Social Connections: Not on file  Intimate Partner Violence: Not At Risk (04/26/2023)   Humiliation, Afraid, Rape, and Kick questionnaire    Fear of Current or Ex-Partner: No    Emotionally Abused: No    Physically Abused: No    Sexually Abused: No    FAMILY HISTORY: Family History  Problem Relation Age of Onset   Asthma Mother    Allergic rhinitis  Mother    Rheum arthritis Mother    Allergic rhinitis Father    Alzheimer's disease Father    Eczema Brother    Allergic rhinitis Brother    Colon cancer Neg Hx    Esophageal cancer Neg Hx    Stomach cancer Neg Hx    Rectal cancer Neg Hx     Review of Systems  Constitutional:  Negative for appetite change, chills, fatigue, fever and unexpected weight change.  HENT:   Negative for hearing loss, lump/mass and trouble swallowing.   Eyes:  Negative for eye problems and icterus.  Respiratory:  Negative for chest tightness, cough and shortness of breath.   Cardiovascular:  Negative for chest pain, leg swelling and palpitations.  Gastrointestinal:  Negative for abdominal distention, abdominal pain, constipation, diarrhea, nausea and vomiting.  Endocrine: Negative for hot flashes.  Genitourinary:  Negative for difficulty urinating.   Musculoskeletal:  Negative for arthralgias.  Skin:  Negative for itching and rash.  Neurological:  Negative for dizziness, extremity weakness,  headaches and numbness.  Hematological:  Negative for adenopathy. Does not bruise/bleed easily.  Psychiatric/Behavioral:  Negative for depression. The patient is not nervous/anxious.       PHYSICAL EXAMINATION     Vitals:   07/29/23 1252  BP: (!) 142/64  Pulse: 75  Resp: 16  Temp: (!) 97.3 F (36.3 C)  SpO2: 97%    No distress, she appears well today and CHEST: Breath sounds clear. Heart: Rate and rhythm regular.  Abdomen: Soft, nontender, nondistended No lower extremity edema, chronic venous stasis changes noted  LABORATORY DATA:  CBC    Component Value Date/Time   WBC 1.0 (L) 07/29/2023 1227   WBC 1.1 (L) 06/03/2023 1237   RBC 2.77 (L) 07/29/2023 1227   HGB 10.3 (L) 07/29/2023 1227   HCT 31.1 (L) 07/29/2023 1227   HCT 39.0 11/24/2022 1035   PLT 157 07/29/2023 1227   MCV 112.3 (H) 07/29/2023 1227   MCH 37.2 (H) 07/29/2023 1227   MCHC 33.1 07/29/2023 1227   RDW 19.8 (H) 07/29/2023 1227    LYMPHSABS 0.8 07/29/2023 1227   MONOABS 0.1 07/29/2023 1227   EOSABS 0.0 07/29/2023 1227   BASOSABS 0.0 07/29/2023 1227    CMP     Component Value Date/Time   NA 136 07/29/2023 1227   K 4.0 07/29/2023 1227   CL 106 07/29/2023 1227   CO2 26 07/29/2023 1227   GLUCOSE 106 (H) 07/29/2023 1227   BUN 15 07/29/2023 1227   CREATININE 0.40 (L) 07/29/2023 1227   CALCIUM 9.1 07/29/2023 1227   PROT 7.1 07/29/2023 1227   ALBUMIN 4.0 07/29/2023 1227   AST 14 (L) 07/29/2023 1227   ALT 9 07/29/2023 1227   ALKPHOS 48 07/29/2023 1227   BILITOT 0.3 07/29/2023 1227   GFRNONAA >60 07/29/2023 1227    ASSESSMENT and THERAPY PLAN:   MDS (myelodysplastic syndrome), high grade (HCC) Myelodysplastic Syndrome Patient has shown clinical and lab response to decitabine . Discussed potential switch to oral therapy with enasidenib, an IDH2 inhibitor, based on MDACC recommendations. Noted potential side effects including differentiation syndrome and gastrointestinal symptoms. -Submit prescription for enasidenib and await insurance approval. -Continue decitabine  infusion this week. -Plan to start enasidenib once received, with monitoring for response and side effects. -Postpone bone marrow biopsy until signs of non-response or progression are observed.  Infection Prophylaxis Patient is nearing the end of her current supply of voriconazole  and ciprofloxacin . -Continue current prophylactic regimen as needed.  Port Maintenance Patient's port remains in place and is being flushed regularly. -Continue regular port flushes every 6-8 weeks.  Follow-up Plan to monitor patient's response to new therapy and adjust treatment as necessary. Open to further consultation with MD Alva Jewels doctor as needed.     All questions were answered. The patient knows to call the clinic with any problems, questions or concerns. We can certainly see the patient much sooner if necessary.  Total encounter time:30 minutes*in  face-to-face visit time, chart review, lab review, care coordination, order entry, and documentation of the encounter time.    *Total Encounter Time as defined by the Centers for Medicare and Medicaid Services includes, in addition to the face-to-face time of a patient visit (documented in the note above) non-face-to-face time: obtaining and reviewing outside history, ordering and reviewing medications, tests or procedures, care coordination (communications with other health care professionals or caregivers) and documentation in the medical record.

## 2023-07-29 NOTE — Progress Notes (Signed)
 Ok to tx with anc = 100 per Dr Arno Bibles

## 2023-07-29 NOTE — Assessment & Plan Note (Signed)
 Myelodysplastic Syndrome Patient has shown clinical and lab response to decitabine . Discussed potential switch to oral therapy with enasidenib, an IDH2 inhibitor, based on MDACC recommendations. Noted potential side effects including differentiation syndrome and gastrointestinal symptoms. -Submit prescription for enasidenib and await insurance approval. -Continue decitabine  infusion this week. -Plan to start enasidenib once received, with monitoring for response and side effects. -Postpone bone marrow biopsy until signs of non-response or progression are observed.  Infection Prophylaxis Patient is nearing the end of her current supply of voriconazole  and ciprofloxacin . -Continue current prophylactic regimen as needed.  Port Maintenance Patient's port remains in place and is being flushed regularly. -Continue regular port flushes every 6-8 weeks.  Follow-up Plan to monitor patient's response to new therapy and adjust treatment as necessary. Open to further consultation with MD Alva Jewels doctor as needed.

## 2023-07-29 NOTE — Telephone Encounter (Signed)
 Oral Oncology Patient Advocate Encounter  After completing a benefits investigation, prior authorization for Idhifa  is not required at this time through [facility].  Patient's copay is $1,226.34.     Paulette Borrow, CPhT-Adv Oncology Pharmacy Patient Advocate [facility] Direct Number: [phone]  Fax: [phone]

## 2023-07-29 NOTE — Telephone Encounter (Signed)
 CRITICAL VALUE STICKER  CRITICAL VALUE:  ANC 0.1    WBC 1.0  RECEIVER (on-site recipient of call): Arbie Knock, LPN  DATE & TIME NOTIFIED: 07/1023  02:01  MESSENGER (representative from lab):  Trenia Fritter  MD NOTIFIED: Murleen Arms, MD  TIME OF NOTIFICATION:02:01  RESPONSE:

## 2023-07-30 ENCOUNTER — Inpatient Hospital Stay: Payer: Medicare Other

## 2023-07-30 ENCOUNTER — Ambulatory Visit: Payer: Medicare Other

## 2023-07-30 ENCOUNTER — Ambulatory Visit: Payer: Medicare Other | Admitting: Hematology and Oncology

## 2023-07-30 ENCOUNTER — Other Ambulatory Visit: Payer: Medicare Other

## 2023-07-30 ENCOUNTER — Other Ambulatory Visit (HOSPITAL_COMMUNITY): Payer: Self-pay

## 2023-07-30 ENCOUNTER — Telehealth: Payer: Self-pay | Admitting: Hematology and Oncology

## 2023-07-30 NOTE — Telephone Encounter (Signed)
Oral Oncology Patient Advocate Encounter  No grant funding is available for Idhifa when used for MDS. I have left a vm with patient to discuss possibly applying for patient assistance.   [employer] is confirming if patient will be able to utilize assistance for a non FDA use. Cas# [id number]  Jinger Neighbors, CPhT-Adv Oncology Pharmacy Patient Advocate [facility] Direct Number: [phone]  Fax: [phone]

## 2023-07-30 NOTE — Telephone Encounter (Signed)
Left patient a vm regarding upcoming appointment

## 2023-07-31 ENCOUNTER — Inpatient Hospital Stay: Payer: Medicare Other

## 2023-07-31 ENCOUNTER — Ambulatory Visit: Payer: Medicare Other | Admitting: Pharmacist

## 2023-07-31 ENCOUNTER — Other Ambulatory Visit: Payer: Self-pay | Admitting: Pharmacy Technician

## 2023-07-31 ENCOUNTER — Encounter (HOSPITAL_COMMUNITY): Payer: Self-pay

## 2023-07-31 ENCOUNTER — Other Ambulatory Visit: Payer: Self-pay

## 2023-07-31 ENCOUNTER — Other Ambulatory Visit (HOSPITAL_COMMUNITY): Payer: Self-pay

## 2023-07-31 DIAGNOSIS — D46Z Other myelodysplastic syndromes: Secondary | ICD-10-CM

## 2023-07-31 NOTE — Progress Notes (Signed)
Patient counseled in clinic telephone visit note on 07/31/23

## 2023-07-31 NOTE — Progress Notes (Signed)
[facility]       Telephone: [phone]?Fax: [phone]   Oncology Clinical Pharmacist Practitioner Initial Assessment  [name] [name] is a 78 y.o. female with a diagnosis of MDS. They were contacted today via telephone visit.  Indication/Regimen Enasidenib [facility]) is being used appropriately for treatment of MDS by Dr. Rachel Moulds.      Wt Readings from Last 1 Encounters:  07/29/23 194 lb 14.4 oz (88.4 kg)    Estimated body surface area is 2.06 meters squared as calculated from the following:   Height as of 04/25/23: 5\' 8"  (1.727 m).   Weight as of 07/29/23: 194 lb 14.4 oz (88.4 kg).  The dosing regimen is 100 mg by mouth daily. This is being given  monotherapy. It is planned to continue until disease progression or unacceptable toxicity. Prescription dose and frequency assessed for appropriateness.  Patient has agreed to treatment which is documented in physician note on 07/29/23. Counseled patient on administration, dosing, side effects, monitoring, drug-food interactions, safe handling, storage, and disposal.  Dose Modifications None at this time   Access Assessment RICKELL WIEHE will be receiving enasidenib through [facility] Concerns: none Start date if known: TBD  Adherence Assessment Reviewed importance on keeping a med schedule and plan for any missed doses Barriers to adherence identified? No  Allergies Allergies  Allergen Reactions   Bee Pollen Shortness Of Breath and Other (See Comments)    Wheezing, also   Latex Itching and Rash   Pollen Extract-Tree Extract [Pollen Extract] Shortness Of Breath, Itching and Other (See Comments)    Wheezing, also   Cat Dander Itching and Other (See Comments)    Itchy eyes, runny nose, sneezing   Dust Mite Extract Itching and Other (See Comments)    Itchy eyes, runny nose, sneezing   Molds & Smuts Itching and Other (See Comments)    Itchy eyes, runny nose,  sneezing    Vitals    07/29/2023   12:52 PM 07/05/2023   12:44 PM 07/04/2023    2:27 PM  Oncology Vitals  Weight 88.406 kg  87.145 kg  Weight (lbs) 194 lbs 14 oz  192 lbs 2 oz  BMI 29.63 kg/m2  29.21 kg/m2  Temp 97.3 F (36.3 C) 98.2 F (36.8 C) 98.9 F (37.2 C)  Pulse Rate 75 72 71  BP 142/64 128/68 129/66  Resp 16 18 15   SpO2 97 % 100 % 98 %  BSA (m2) 2.06 m2  2.04 m2     Laboratory Data    Latest Ref Rng & Units 07/29/2023   12:27 PM 07/01/2023    7:32 AM 06/03/2023   12:37 PM  CBC EXTENDED  WBC 4.0 - 10.5 K/uL 1.0  2.7  1.1   RBC 3.87 - 5.11 MIL/uL 2.77  2.88  2.70   Hemoglobin 12.0 - 15.0 g/dL 30.8  65.7  9.3   HCT 84.6 - 46.0 % 31.1  30.9  28.3   Platelets 150 - 400 K/uL 157  182  89   NEUT# 1.7 - 7.7 K/uL 0.1  0.1  0.3   Lymph# 0.7 - 4.0 K/uL 0.8  2.1  0.7        Latest Ref Rng & Units 07/29/2023   12:27 PM 07/01/2023    7:32 AM 05/31/2023    8:58 AM  CMP  Glucose 70 - 99 mg/dL 962  82  85   BUN 8 - 23 mg/dL 15  18  11   Creatinine 0.44 - 1.00 mg/dL 1.61  0.96  0.45   Sodium 135 - 145 mmol/L 136  140  138   Potassium 3.5 - 5.1 mmol/L 4.0  4.0  3.9   Chloride 98 - 111 mmol/L 106  106  107   CO2 22 - 32 mmol/L 26  27  28    Calcium 8.9 - 10.3 mg/dL 9.1  9.1  8.7   Total Protein 6.5 - 8.1 g/dL 7.1  7.5  6.8   Total Bilirubin 0.0 - 1.2 mg/dL 0.3  0.3  0.4   Alkaline Phos 38 - 126 U/L 48  53  51   AST 15 - 41 U/L 14  18  12    ALT 0 - 44 U/L 9  9  6     Lab Results  Component Value Date   MG 2.2 04/27/2023   MG 2.0 04/25/2023   No results found for: "CA2729"   Contraindications Contraindications were reviewed? Yes Contraindications to therapy were identified? No   Safety Precautions The following safety precautions for the use of enasidenib were reviewed:  Fever: reviewed the importance of having a thermometer and the Centers for Disease Control and Prevention (CDC) definition of fever which is 100.98F (38C) or higher. Patient should call 24/7 triage  at [phone] if experiencing a fever or any other symptoms Changes in electrolytes and other laboratory values Changes in liver function Decreased appetite or weight loss Diarrhea (loose and/or urgent bowel movements) Nausea or vomiting Differentiation syndrome Take with or without food Missed doses Handing body fluids and waste Pregnancy, sexual activity, and contraception Storage and handling  Medication Reconciliation Current Outpatient Medications  Medication Sig Dispense Refill   acetaminophen (TYLENOL) 325 MG tablet Take 325-650 mg by mouth every 6 (six) hours as needed for mild pain (pain score 1-3), headache or fever.     albuterol (VENTOLIN HFA) 108 (90 Base) MCG/ACT inhaler Inhale 2 puffs into the lungs every 6 (six) hours as needed for wheezing or shortness of breath. 8 g 6   Azelastine-Fluticasone 137-50 MCG/ACT SUSP Place 1 spray into the nose 2 (two) times daily as needed. (Patient taking differently: Place 1 spray into both nostrils 2 (two) times daily as needed (for rhinitis).) 23 g 5   b complex vitamins tablet Take 1 tablet by mouth daily.     Cholecalciferol (VITAMIN D3) 25 MCG (1000 UT) CAPS Take 1,000 Units by mouth daily.     ciprofloxacin (CIPRO) 500 MG tablet Take 1 tablet (500 mg total) by mouth 2 (two) times daily. 60 tablet 1   famotidine (PEPCID) 40 MG tablet Take 1 tablet (40 mg total) by mouth daily. 30 tablet 5   gabapentin (NEURONTIN) 800 MG tablet Take 800 mg by mouth in the morning and at bedtime.     Golimumab (SIMPONI ARIA IV) Inject 206.4 mg into the vein See admin instructions. Inject 206.4 mg into the vein every 8 weeks     ketoconazole (NIZORAL) 2 % cream Apply to bottoms of feet for 2-3 weeks 60 g 2   levothyroxine (SYNTHROID) 100 MCG tablet Take 100 mcg by mouth daily before breakfast.     Magnesium 250 MG TABS Take 250 mg by mouth daily.     montelukast (SINGULAIR) 10 MG tablet TAKE 1 TABLET(10 MG) BY MOUTH AT BEDTIME 90 tablet 1   UNABLE  TO FIND Med Name: CBD Gummy in morning     UNABLE TO FIND Med Name: CBD gelcap, takes  every evening     voriconazole (VFEND) 200 MG tablet Take 1 tablet (200 mg total) by mouth 2 (two) times daily. 60 tablet 1   enasidenib mesylate (IDHIFA) 100 MG tablet Take 1 tablet (100 mg total) by mouth daily. (Patient not taking: Reported on 07/31/2023) 30 tablet 3   EPINEPHrine 0.3 mg/0.3 mL IJ SOAJ injection Inject 0.3 mg into the muscle as needed for anaphylaxis. (Patient not taking: Reported on 07/31/2023) 2 each 2   hydrOXYzine (VISTARIL) 25 MG capsule Take 1 capsule (25 mg total) by mouth 2 (two) times daily as needed. (Patient not taking: Reported on 07/31/2023) 30 capsule 0   ondansetron (ZOFRAN) 8 MG tablet Take 8 mg by mouth every 8 (eight) hours as needed for nausea or vomiting. (Patient not taking: Reported on 07/31/2023)     pantoprazole (PROTONIX) 40 MG tablet TAKE 1 TABLET(40 MG) BY MOUTH TWICE DAILY BEFORE A MEAL (Patient not taking: Reported on 07/31/2023) 180 tablet 0   triamcinolone ointment (KENALOG) 0.1 % Apply 1 Application topically 2 (two) times daily. (Patient not taking: Reported on 07/31/2023) 80 g 2   No current facility-administered medications for this visit.   Facility-Administered Medications Ordered in Other Visits  Medication Dose Route Frequency Provider Last Rate Last Admin   0.9 %  sodium chloride infusion (Manually program via Guardrails IV Fluids)  250 mL Intravenous Continuous Rachel Moulds, MD   Stopped at 04/17/23 1657    Medication reconciliation is based on the patient's most recent medication list in the electronic medical record (EMR) including herbal products and OTC medications.   The patient's medication list was reviewed today with the patient? Yes   Drug-drug interactions (DDIs) DDIs were evaluated? Yes Significant DDIs identified?  Voriconazole flagged for topical agents  Drug-Food Interactions Drug-food interactions were evaluated? Yes Drug-food  interactions identified? No   Follow-up Plan  Patient education handout given to patient Will forward note from today to Dr. Al Pimple about start date. Patient states she is not doing decitabine any longer after 07/29/23 administration. Will need to be removed from treatment plan. Start enasidnenib 100 mg by mouth daily once given okay from Dr. Al Pimple Side effects reviewed Will used loperamide PRN for loose stool and ondansetron PRN for nausea Can follow up with clinical pharmacy as needed going forward   [name] participated in the discussion, expressed understanding, and voiced agreement with the above plan. All questions were answered to their satisfaction. The patient was advised to contact the clinic at [phone] with any questions or concerns prior to their return visit.   I spent 30 minutes assessing the patient.  Prynce Jacober A. Odetta Pink, PharmD, BCOP, CPP  Anselm Lis, RPH-CPP, 07/31/2023 3:48 PM  **Disclaimer: This note was dictated with voice recognition software. Similar sounding words can inadvertently be transcribed and this note may contain transcription errors which may not have been corrected upon publication of note.**

## 2023-07-31 NOTE — Progress Notes (Signed)
Specialty Pharmacy Initial Fill Coordination Note  [name] [name] is a 78 y.o. female contacted today regarding refills of specialty medication(s) Enasidenib Mesylate (IDHIFA) .  Patient requested Delivery  on 08/02/23  to verified address [address]   [place] [address]-   Medication will be filled on 08/01/23.   Patient is aware of $1,158.46 copayment.

## 2023-08-01 ENCOUNTER — Other Ambulatory Visit (HOSPITAL_COMMUNITY): Payer: Self-pay

## 2023-08-01 ENCOUNTER — Other Ambulatory Visit: Payer: Self-pay

## 2023-08-01 ENCOUNTER — Ambulatory Visit: Payer: Medicare Other

## 2023-08-01 ENCOUNTER — Other Ambulatory Visit: Payer: Self-pay | Admitting: Hematology and Oncology

## 2023-08-02 ENCOUNTER — Encounter: Payer: Self-pay | Admitting: Hematology and Oncology

## 2023-08-02 ENCOUNTER — Other Ambulatory Visit: Payer: Self-pay

## 2023-08-02 ENCOUNTER — Ambulatory Visit: Payer: Medicare Other

## 2023-08-02 ENCOUNTER — Other Ambulatory Visit (HOSPITAL_COMMUNITY): Payer: Self-pay

## 2023-08-03 ENCOUNTER — Other Ambulatory Visit (HOSPITAL_COMMUNITY): Payer: Self-pay

## 2023-08-06 ENCOUNTER — Ambulatory Visit: Payer: Medicare Other | Admitting: Pharmacist

## 2023-08-08 ENCOUNTER — Encounter: Payer: Self-pay | Admitting: Hematology and Oncology

## 2023-08-08 ENCOUNTER — Telehealth: Payer: Self-pay | Admitting: *Deleted

## 2023-08-08 MED ORDER — OSELTAMIVIR PHOSPHATE 75 MG PO CAPS: ORAL_CAPSULE | ORAL | 0 refills | Status: DC

## 2023-08-08 NOTE — Telephone Encounter (Signed)
 This RN sent prescription for Tamiflu per My Chart message.

## 2023-08-12 ENCOUNTER — Encounter: Payer: Self-pay | Admitting: Hematology and Oncology

## 2023-08-13 ENCOUNTER — Ambulatory Visit (HOSPITAL_COMMUNITY)
Admission: RE | Admit: 2023-08-13 | Discharge: 2023-08-13 | Disposition: A | Discharge status: Home / Self Care | Payer: Medicare Other | Source: Ambulatory Visit | Attending: Hematology and Oncology | Admitting: Hematology and Oncology

## 2023-08-13 ENCOUNTER — Telehealth: Payer: Self-pay | Admitting: *Deleted

## 2023-08-13 ENCOUNTER — Other Ambulatory Visit: Payer: Self-pay | Admitting: *Deleted

## 2023-08-13 ENCOUNTER — Encounter: Payer: Self-pay | Admitting: *Deleted

## 2023-08-13 DIAGNOSIS — D696 Thrombocytopenia, unspecified: Secondary | ICD-10-CM

## 2023-08-13 DIAGNOSIS — K5900 Constipation, unspecified: Secondary | ICD-10-CM | POA: Insufficient documentation

## 2023-08-13 DIAGNOSIS — D61818 Other pancytopenia: Secondary | ICD-10-CM | POA: Insufficient documentation

## 2023-08-13 DIAGNOSIS — D46Z Other myelodysplastic syndromes: Secondary | ICD-10-CM | POA: Diagnosis present

## 2023-08-13 NOTE — Telephone Encounter (Signed)
 Per My Chart -  " I haven't had a bowel moment in a week. My stomach is bloated and cramping. I also have really bad hemorrhoids. I need help, this is miserable. Please advise. I've been taking Miralax with no results. I don't have a gastroenterologist. Dr Orvan Falconer left [place] "  Pt states she has passed a very small amount of very hard small stool - and passing " a little gas not a lot"  Pt  is taking 1 dose of miralax every am.  Per review of above with MD and due to concern for pancytopenia - need to obtain a KUB for evaluation.  This RN called pt and discussed above with instructions of where to go for abd xray as well as come to this office post xray and request to speak to this RN.

## 2023-08-19 ENCOUNTER — Other Ambulatory Visit: Payer: Self-pay

## 2023-08-19 ENCOUNTER — Other Ambulatory Visit (HOSPITAL_COMMUNITY): Payer: Self-pay

## 2023-08-19 NOTE — Progress Notes (Signed)
 Specialty Pharmacy Refill Coordination Note  [name] [name] is a 79 y.o. female contacted today regarding refills of specialty medication(s) Enasidenib Mesylate [facility])   Patient requested Delivery   Delivery date: 08/29/23   Verified address: [address]   [place] [address]-   Medication will be filled on 08/28/23.   Copay should be $0.00. Please confirm with patient before shipping.

## 2023-08-19 NOTE — Progress Notes (Signed)
 Specialty Pharmacy Ongoing Clinical Assessment Note  [name] [name] is a 78 y.o. female who is being followed by the specialty pharmacy service for RxSp Oncology   Patient's specialty medication(s) reviewed today: Enasidenib Mesylate (IDHIFA)   Missed doses in the last 4 weeks: 0   Patient/Caregiver did not have any additional questions or concerns.   Therapeutic benefit summary: Unable to assess   Adverse events/side effects summary: Experienced adverse events/side effects (Tolerable fatigue & nausea)   Patient's therapy is appropriate to: Continue    Goals Addressed             This Visit's Progress    Stabilization of disease       Patient is unable to be assessed as therapy was recently initiated. Patient will be evaluated at upcoming provider appointment to assess progress         Follow up:  3 months  Bobette Mo Specialty Pharmacist

## 2023-08-20 ENCOUNTER — Other Ambulatory Visit: Payer: Self-pay

## 2023-08-21 ENCOUNTER — Inpatient Hospital Stay

## 2023-08-21 ENCOUNTER — Inpatient Hospital Stay: Payer: Medicare Other

## 2023-08-21 ENCOUNTER — Inpatient Hospital Stay: Payer: Medicare Other | Attending: Hematology and Oncology | Admitting: Hematology and Oncology

## 2023-08-21 VITALS — BP 139/69 | HR 79 | Temp 97.5°F | Resp 16 | Wt 191.6 lb

## 2023-08-21 DIAGNOSIS — J4540 Moderate persistent asthma, uncomplicated: Secondary | ICD-10-CM | POA: Diagnosis not present

## 2023-08-21 DIAGNOSIS — Z840 Family history of diseases of the skin and subcutaneous tissue: Secondary | ICD-10-CM | POA: Diagnosis not present

## 2023-08-21 DIAGNOSIS — Z825 Family history of asthma and other chronic lower respiratory diseases: Secondary | ICD-10-CM | POA: Diagnosis not present

## 2023-08-21 DIAGNOSIS — K5900 Constipation, unspecified: Secondary | ICD-10-CM | POA: Diagnosis not present

## 2023-08-21 DIAGNOSIS — Z8261 Family history of arthritis: Secondary | ICD-10-CM | POA: Diagnosis not present

## 2023-08-21 DIAGNOSIS — R519 Headache, unspecified: Secondary | ICD-10-CM | POA: Insufficient documentation

## 2023-08-21 DIAGNOSIS — Z87891 Personal history of nicotine dependence: Secondary | ICD-10-CM | POA: Insufficient documentation

## 2023-08-21 DIAGNOSIS — D696 Thrombocytopenia, unspecified: Secondary | ICD-10-CM | POA: Insufficient documentation

## 2023-08-21 DIAGNOSIS — Z818 Family history of other mental and behavioral disorders: Secondary | ICD-10-CM | POA: Diagnosis not present

## 2023-08-21 DIAGNOSIS — Z79899 Other long term (current) drug therapy: Secondary | ICD-10-CM | POA: Diagnosis not present

## 2023-08-21 DIAGNOSIS — R197 Diarrhea, unspecified: Secondary | ICD-10-CM | POA: Insufficient documentation

## 2023-08-21 DIAGNOSIS — Z9089 Acquired absence of other organs: Secondary | ICD-10-CM | POA: Diagnosis not present

## 2023-08-21 DIAGNOSIS — R110 Nausea: Secondary | ICD-10-CM | POA: Diagnosis not present

## 2023-08-21 DIAGNOSIS — D46Z Other myelodysplastic syndromes: Secondary | ICD-10-CM | POA: Diagnosis not present

## 2023-08-21 DIAGNOSIS — D469 Myelodysplastic syndrome, unspecified: Secondary | ICD-10-CM | POA: Diagnosis present

## 2023-08-21 DIAGNOSIS — E039 Hypothyroidism, unspecified: Secondary | ICD-10-CM | POA: Diagnosis not present

## 2023-08-21 LAB — CMP (CANCER CENTER ONLY)
ALT: 11 U/L (ref 0–44)
AST: 16 U/L (ref 15–41)
Albumin: 3.8 g/dL (ref 3.5–5.0)
Alkaline Phosphatase: 38 U/L (ref 38–126)
Anion gap: 7 (ref 5–15)
BUN: 17 mg/dL (ref 8–23)
CO2: 25 mmol/L (ref 22–32)
Calcium: 8.4 mg/dL — ABNORMAL LOW (ref 8.9–10.3)
Chloride: 105 mmol/L (ref 98–111)
Creatinine: 0.53 mg/dL (ref 0.44–1.00)
GFR, Estimated: >60 mL/min (ref >60)
Glucose, Bld: 92 mg/dL (ref 70–99)
Potassium: 3.9 mmol/L (ref 3.5–5.1)
Sodium: 137 mmol/L (ref 135–145)
Total Bilirubin: 0.6 mg/dL (ref 0.0–1.2)
Total Protein: 6.7 g/dL (ref 6.5–8.1)

## 2023-08-21 LAB — CBC WITH DIFFERENTIAL/PLATELET
Abs Immature Granulocytes: 0.05 10*3/uL (ref 0.00–0.07)
Basophils Absolute: 0 10*3/uL (ref 0.0–0.1)
Basophils Relative: 1 %
Eosinophils Absolute: 0 10*3/uL (ref 0.0–0.5)
Eosinophils Relative: 0 %
HCT: 34.6 % — ABNORMAL LOW (ref 36.0–46.0)
Hemoglobin: 11.4 g/dL — ABNORMAL LOW (ref 12.0–15.0)
Immature Granulocytes: 1 %
Lymphocytes Relative: 50 %
Lymphs Abs: 1.8 10*3/uL (ref 0.7–4.0)
MCH: 36.7 pg — ABNORMAL HIGH (ref 26.0–34.0)
MCHC: 32.9 g/dL (ref 30.0–36.0)
MCV: 111.3 fL — ABNORMAL HIGH (ref 80.0–100.0)
Monocytes Absolute: 0.3 10*3/uL (ref 0.1–1.0)
Monocytes Relative: 8 %
Neutro Abs: 1.5 10*3/uL — ABNORMAL LOW (ref 1.7–7.7)
Neutrophils Relative %: 40 %
Platelets: 94 10*3/uL — ABNORMAL LOW (ref 150–400)
RBC: 3.11 MIL/uL — ABNORMAL LOW (ref 3.87–5.11)
RDW: 18.6 % — ABNORMAL HIGH (ref 11.5–15.5)
WBC: 3.7 10*3/uL — ABNORMAL LOW (ref 4.0–10.5)
nRBC: 0 % (ref 0.0–0.2)

## 2023-08-21 MED ORDER — PROCHLORPERAZINE MALEATE 5 MG PO TABS: 5.0000 mg | ORAL_TABLET | Freq: Four times a day (QID) | ORAL | 1 refills | Status: DC | PRN

## 2023-08-21 MED ORDER — SODIUM CHLORIDE 0.9% FLUSH
10.0000 mL | Freq: Once | INTRAVENOUS | Status: AC
  Administered 2023-08-21: 10 mL

## 2023-08-21 MED ORDER — HEPARIN SOD (PORK) LOCK FLUSH 100 UNIT/ML IV SOLN
500.0000 [IU] | Freq: Once | INTRAVENOUS | Status: AC
  Administered 2023-08-21: 500 [IU]

## 2023-08-21 NOTE — Assessment & Plan Note (Addendum)
 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   4 points - IPSS-R Score Intermediate risk Median survival - 3 yrs Median time to 25% AML evolution: 3.2 years   01/07/2023: Bone marrow aspirate: Hypercellular bone marrow 50% cellularity with dysplasia, flow cytometry: 5% CD34 positive blasts, cytogenetics: Normal MDS [employer] panel showed ASXL , CBL, IDH2 and SRSF2.    She was treated with azacitidine but unfortunately 3 days into treatment developed a rash likely secondary to azacitidine.  She then was started on decitabine. Patient has shown clinical and lab response to decitabine. Discussed potential switch to oral therapy with enasidenib, an IDH2 inhibitor, based on MDACC recommendations. Noted potential side effects including differentiation syndrome and gastrointestinal symptoms. No evidence of differentiation syndrome.  Cancer Treatment Side Effects Nausea and headache reported with new medication. Blood work shows improvement in white count, hemoglobin, and platelet count. -Discontinue Ciprofloxacin and Voriconazole as blood counts have normalized. -Replace Zofran with Compazine 5mg  as needed for nausea to potentially reduce constipation and headache. -Check in with patient regarding efficacy of Compazine for nausea.  Constipation Managed with daily Miralax, patient reports regular bowel movements. -Continue Miralax daily.  Port Maintenance Port flushes occurring every few weeks. -Advise to flush port every 6-8 weeks.  Follow-up in 4 weeks to monitor response to medication changes and overall health status.

## 2023-08-21 NOTE — Progress Notes (Signed)
 [facility] Cancer Follow up:    [name] [name], [name], MD [address] [place] [address]   DIAGNOSIS: Myelodysplastic Syndrome  SUMMARY OF ONCOLOGIC HISTORY: Oncology History  MDS (myelodysplastic syndrome), high grade (HCC)  01/07/2023 Initial Biopsy   Bone marrow biopsy:cellular bone marrow with focal areas of hypercellularity (approximately up to  50%).  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  Blasts appear mildly mildly increased, approximately 5% to focally up to 10%.  Flow cytometric analysis reveals 5% CD34 positive blasts.  While the findings could be attributable to the patient's coexisting conditions, the possibility of a myeloid neoplasm such as myelodysplastic neoplasm with increased blasts cannot be entirely excluded. normal karyotype   01/29/2023 Initial Diagnosis   MDS (myelodysplastic syndrome), high grade (HCC)   02/04/2023 - 02/06/2023 Chemotherapy   Patient is on Treatment Plan : MYELODYSPLASIA  Azacitidine IV D1-5 q28d     03/04/2023 - 07/29/2023 Chemotherapy   Patient is on Treatment Plan : MYELODYSPLASIA Decitabine D1-5 q28d       CURRENT THERAPY: Azacitidine  [name] [name] 78 y.o. female returns for follow-up while on enasidenib.  Discussed the use of AI scribe software for clinical note transcription with the patient, who gave verbal consent to proceed.  History of Present Illness    Discussed the use of AI scribe software for clinical note transcription with the patient, who gave verbal consent to proceed.  History of Present Illness     [name] [name] is a 78 year old female with myelodysplastic syndrome who  is now on enasidenib who is here for follow up  She is currently on a medication regimen that includes Simponi Aria and Idhifa.  She experiences nausea and a dull headache, often waking up with these symptoms. Ondansetron was initially prescribed for nausea, but she is concerned it may worsen her constipation.  She has tolerated Compazine well in the past without issues of constipation.  She has a history of constipation, which she manages with Miralax. Initially, she took Miralax twice daily after a bowel clean-out, but she has since reduced the dose to once daily, which she reports is effective. She confirms having regular bowel movements, with the last one occurring today.  Her blood counts have improved, with a white blood cell count of 3,700 and hemoglobin of 11.4. Platelet count is slightly low but not dangerously so.  Rest of the pertinent 10 point ROS reviewed and neg.  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) 04/26/2023   Hypokalemia 04/26/2023   Neutropenic fever (HCC) 04/25/2023   Hives 03/18/2023   MDS (myelodysplastic syndrome), high grade (HCC) 01/29/2023   Obesity 03/02/2021   Other long term (current) drug therapy 03/02/2021   Primary osteoarthritis 03/02/2021   SOB (shortness of breath) 12/08/2020   Pericardial effusion 12/08/2020   Dry eyes/dry mouth 05/25/2019   Bronchitis, mucopurulent recurrent (HCC) 02/26/2019   Moderate persistent asthma without complication 02/24/2019   Perennial and seasonal allergic rhinitis 02/24/2019   Seasonal allergic conjunctivitis 02/24/2019   Primary osteoarthritis of both knees 04/10/2016   Lung nodule 08/19/2014   HTN (hypertension) 08/07/2012   HLD (hyperlipidemia) 09/26/2011   Asthma 09/06/2010   Depression 09/06/2010   History of allergy 09/06/2010   Hypothyroidism 09/06/2010   Lower back pain 09/06/2010   Arthritis 09/06/2010   Pain in joint, pelvic region and thigh 09/06/2010   Peripheral neuropathy 09/06/2010  Psoriasis 09/06/2010   Therapeutic drug monitoring 09/06/2010   Tinnitus 09/06/2010   Vitamin B 12 deficiency 09/06/2010   Cervical spondylosis without myelopathy 04/24/2006   Tear of lateral cartilage or meniscus of knee, current 03/13/2006     is allergic to bee pollen, latex, pollen extract-tree extract [pollen extract], cat dander, dust mite extract, and molds & smuts.  MEDICAL HISTORY: Past Medical History:  Diagnosis Date   Asthma    Diverticulitis    Hypothyroid    MDS (myelodysplastic syndrome) (HCC) 01/2023   Psoriatic arthritis (HCC)     SURGICAL HISTORY: Past Surgical History:  Procedure Laterality Date   COLONOSCOPY  08/2021   HIP SURGERY Bilateral    IR IMAGING GUIDED PORT INSERTION  02/20/2023   TONSILLECTOMY     TUBAL LIGATION      SOCIAL HISTORY: Social History   Socioeconomic History   Marital status: Widowed    Spouse name: Not on file   Number of children: Not on file   Years of education: Not on file   Highest education level: Not on file  Occupational History   Not on file  Tobacco Use   Smoking status: Former    Current packs/day: 0.00    Average packs/day: 0.1 packs/day for 33.4 years (3.3 ttl pk-yrs)    Types: Cigarettes    Start date: 28    Quit date: 11/18/2000    Years since quitting: 22.7   Smokeless tobacco: Never   Tobacco comments:    3 cigarettes a day  Vaping Use   Vaping status: Never Used  Substance and Sexual Activity   Alcohol use: Never   Drug use: Never   Sexual activity: Not Currently  Other Topics Concern   Not on file  Social History Narrative   Lives with children   Social Drivers of Health   Financial Resource Strain: Not on file  Food Insecurity: No Food Insecurity (04/26/2023)   Hunger Vital Sign    Worried About Running Out of Food in the Last Year: Never true    Ran Out of Food in the Last Year: Never true  Transportation Needs: No Transportation Needs (04/26/2023)   PRAPARE - Administrator, Civil Service (Medical): No    Lack of Transportation (Non-Medical): No  Physical Activity: Not on file  Stress: Not on file  Social Connections: Not on file  Intimate Partner Violence: Not At Risk (04/26/2023)   Humiliation, Afraid, Rape,  and Kick questionnaire    Fear of Current or Ex-Partner: No    Emotionally Abused: No    Physically Abused: No    Sexually Abused: No    FAMILY HISTORY: Family History  Problem Relation Age of Onset   Asthma Mother    Allergic rhinitis Mother    Rheum arthritis Mother    Allergic rhinitis Father    Alzheimer's disease Father    Eczema Brother    Allergic rhinitis Brother    Colon cancer Neg Hx    Esophageal cancer Neg Hx    Stomach cancer Neg Hx    Rectal cancer Neg Hx     Review of Systems  Constitutional:  Negative for appetite change, chills, fatigue, fever and unexpected weight change.  HENT:   Negative for hearing loss, lump/mass and trouble swallowing.   Eyes:  Negative for eye problems and icterus.  Respiratory:  Negative for chest tightness, cough and shortness of breath.   Cardiovascular:  Negative for chest pain, leg swelling and palpitations.  Gastrointestinal:  Negative for abdominal distention, abdominal pain, constipation, diarrhea, nausea and vomiting.  Endocrine: Negative for hot flashes.  Genitourinary:  Negative for difficulty urinating.   Musculoskeletal:  Negative for arthralgias.  Skin:  Negative for itching and rash.  Neurological:  Negative for dizziness, extremity weakness, headaches and numbness.  Hematological:  Negative for adenopathy. Does not bruise/bleed easily.  Psychiatric/Behavioral:  Negative for depression. The patient is not nervous/anxious.       PHYSICAL EXAMINATION     Vitals:   08/21/23 1354  BP: 139/69  Pulse: 79  Resp: 16  Temp: (!) 97.5 F (36.4 C)  SpO2: 99%    No distress, she appears well today and CHEST: Breath sounds clear. Heart: Rate and rhythm regular.  Abdomen: Soft, nontender, nondistended No lower extremity edema, chronic venous stasis changes noted  LABORATORY DATA:  CBC    Component Value Date/Time   WBC 3.7 (L) 08/21/2023 1326   RBC 3.11 (L) 08/21/2023 1326   HGB 11.4 (L) 08/21/2023 1326    HGB 10.3 (L) 07/29/2023 1227   HCT 34.6 (L) 08/21/2023 1326   HCT 39.0 11/24/2022 1035   PLT 94 (L) 08/21/2023 1326   PLT 157 07/29/2023 1227   MCV 111.3 (H) 08/21/2023 1326   MCH 36.7 (H) 08/21/2023 1326   MCHC 32.9 08/21/2023 1326   RDW 18.6 (H) 08/21/2023 1326   LYMPHSABS 1.8 08/21/2023 1326   MONOABS 0.3 08/21/2023 1326   EOSABS 0.0 08/21/2023 1326   BASOSABS 0.0 08/21/2023 1326    CMP     Component Value Date/Time   NA 137 08/21/2023 1326   K 3.9 08/21/2023 1326   CL 105 08/21/2023 1326   CO2 25 08/21/2023 1326   GLUCOSE 92 08/21/2023 1326   BUN 17 08/21/2023 1326   CREATININE 0.53 08/21/2023 1326   CALCIUM 8.4 (L) 08/21/2023 1326   PROT 6.7 08/21/2023 1326   ALBUMIN 3.8 08/21/2023 1326   AST 16 08/21/2023 1326   ALT 11 08/21/2023 1326   ALKPHOS 38 08/21/2023 1326   BILITOT 0.6 08/21/2023 1326   GFRNONAA >60 08/21/2023 1326    ASSESSMENT and THERAPY PLAN:   MDS (myelodysplastic syndrome), high grade (HCC) Myelodysplastic Syndrome  MDS (myelodysplastic syndrome), high grade (HCC) This is a very pleasant 78 year old female patient with MDS, ASXL1 mutation, CBL, 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   4 points - IPSS-R Score Intermediate risk Median survival - 3 yrs Median time to 25% AML evolution: 3.2 years   01/07/2023: Bone marrow aspirate: Hypercellular bone marrow 50% cellularity with dysplasia, flow cytometry: 5% CD34 positive blasts, cytogenetics: Normal MDS [employer] panel showed ASXL , CBL, IDH2 and SRSF2.    She was treated with azacitidine but unfortunately 3 days into treatment developed a rash likely secondary to azacitidine.  She then was started on decitabine. Patient has shown clinical and lab response to decitabine. Discussed potential switch to oral therapy with enasidenib, an IDH2 inhibitor, based on MDACC recommendations. Noted potential side effects including differentiation syndrome and gastrointestinal  symptoms. No evidence of differentiation syndrome.  Cancer Treatment Side Effects Nausea and headache reported with new medication. Blood work shows improvement in white count, hemoglobin, and platelet count. -Discontinue Ciprofloxacin and Voriconazole as blood counts have normalized. -Replace Zofran with Compazine 5mg  as needed for nausea to potentially reduce constipation and headache. -Check in with patient regarding efficacy of Compazine for nausea.  Constipation Managed with daily Miralax, patient reports regular bowel movements. -Continue  Miralax daily.  Port Maintenance Port flushes occurring every few weeks. -Advise to flush port every 6-8 weeks.  Follow-up in 4 weeks to monitor response to medication changes and overall health status.      All questions were answered. The patient knows to call the clinic with any problems, questions or concerns. We can certainly see the patient much sooner if necessary.  Total encounter time:30 minutes*in face-to-face visit time, chart review, lab review, care coordination, order entry, and documentation of the encounter time.    *Total Encounter Time as defined by the Centers for Medicare and Medicaid Services includes, in addition to the face-to-face time of a patient visit (documented in the note above) non-face-to-face time: obtaining and reviewing outside history, ordering and reviewing medications, tests or procedures, care coordination (communications with other health care professionals or caregivers) and documentation in the medical record.

## 2023-08-27 ENCOUNTER — Ambulatory Visit: Payer: Medicare Other | Admitting: Allergy & Immunology

## 2023-09-08 ENCOUNTER — Encounter: Payer: Self-pay | Admitting: Hematology and Oncology

## 2023-09-11 ENCOUNTER — Other Ambulatory Visit: Payer: Self-pay | Admitting: *Deleted

## 2023-09-11 ENCOUNTER — Encounter: Payer: Self-pay | Admitting: Hematology and Oncology

## 2023-09-11 ENCOUNTER — Inpatient Hospital Stay (HOSPITAL_BASED_OUTPATIENT_CLINIC_OR_DEPARTMENT_OTHER): Admitting: Hematology and Oncology

## 2023-09-11 ENCOUNTER — Other Ambulatory Visit: Payer: Self-pay

## 2023-09-11 ENCOUNTER — Other Ambulatory Visit: Payer: Self-pay | Admitting: Pharmacy Technician

## 2023-09-11 ENCOUNTER — Inpatient Hospital Stay

## 2023-09-11 ENCOUNTER — Other Ambulatory Visit (HOSPITAL_COMMUNITY): Payer: Self-pay

## 2023-09-11 VITALS — BP 134/62 | HR 79 | Temp 97.2°F | Resp 16 | Wt 193.4 lb

## 2023-09-11 DIAGNOSIS — D61818 Other pancytopenia: Secondary | ICD-10-CM

## 2023-09-11 DIAGNOSIS — K5900 Constipation, unspecified: Secondary | ICD-10-CM

## 2023-09-11 DIAGNOSIS — D46Z Other myelodysplastic syndromes: Secondary | ICD-10-CM

## 2023-09-11 DIAGNOSIS — D696 Thrombocytopenia, unspecified: Secondary | ICD-10-CM

## 2023-09-11 DIAGNOSIS — R197 Diarrhea, unspecified: Secondary | ICD-10-CM

## 2023-09-11 LAB — CMP (CANCER CENTER ONLY)
ALT: 9 U/L (ref 0–44)
AST: 15 U/L (ref 15–41)
Albumin: 3.9 g/dL (ref 3.5–5.0)
Alkaline Phosphatase: 44 U/L (ref 38–126)
Anion gap: 9 (ref 5–15)
BUN: 10 mg/dL (ref 8–23)
CO2: 26 mmol/L (ref 22–32)
Calcium: 8.8 mg/dL — ABNORMAL LOW (ref 8.9–10.3)
Chloride: 104 mmol/L (ref 98–111)
Creatinine: 0.47 mg/dL (ref 0.44–1.00)
GFR, Estimated: >60 mL/min (ref >60)
Glucose, Bld: 99 mg/dL (ref 70–99)
Potassium: 4 mmol/L (ref 3.5–5.1)
Sodium: 139 mmol/L (ref 135–145)
Total Bilirubin: 0.7 mg/dL (ref 0.0–1.2)
Total Protein: 6.7 g/dL (ref 6.5–8.1)

## 2023-09-11 LAB — CBC WITH DIFFERENTIAL/PLATELET
Abs Immature Granulocytes: 0.01 10*3/uL (ref 0.00–0.07)
Basophils Absolute: 0 10*3/uL (ref 0.0–0.1)
Basophils Relative: 1 %
Eosinophils Absolute: 0 10*3/uL (ref 0.0–0.5)
Eosinophils Relative: 1 %
HCT: 36.4 % (ref 36.0–46.0)
Hemoglobin: 12 g/dL (ref 12.0–15.0)
Immature Granulocytes: 0 %
Lymphocytes Relative: 42 %
Lymphs Abs: 1.7 10*3/uL (ref 0.7–4.0)
MCH: 36.7 pg — ABNORMAL HIGH (ref 26.0–34.0)
MCHC: 33 g/dL (ref 30.0–36.0)
MCV: 111.3 fL — ABNORMAL HIGH (ref 80.0–100.0)
Monocytes Absolute: 0.4 10*3/uL (ref 0.1–1.0)
Monocytes Relative: 10 %
Neutro Abs: 1.9 10*3/uL (ref 1.7–7.7)
Neutrophils Relative %: 46 %
Platelets: 179 10*3/uL (ref 150–400)
RBC: 3.27 MIL/uL — ABNORMAL LOW (ref 3.87–5.11)
RDW: 17.5 % — ABNORMAL HIGH (ref 11.5–15.5)
WBC: 4 10*3/uL (ref 4.0–10.5)
nRBC: 0 % (ref 0.0–0.2)

## 2023-09-11 MED ORDER — ENASIDENIB MESYLATE 50 MG PO TABS
50.0000 mg | ORAL_TABLET | Freq: Every day | ORAL | 3 refills | Status: DC
  Filled 2023-09-11 (×2): qty 30, 30d supply, fill #0
  Filled 2023-10-03 (×2): qty 30, 30d supply, fill #1
  Filled 2023-11-05: qty 30, 30d supply, fill #2
  Filled 2023-12-02: qty 30, 30d supply, fill #3

## 2023-09-11 MED ORDER — HEPARIN SOD (PORK) LOCK FLUSH 100 UNIT/ML IV SOLN
250.0000 [IU] | Freq: Once | INTRAVENOUS | Status: AC
  Administered 2023-09-11: 250 [IU]

## 2023-09-11 MED ORDER — SODIUM CHLORIDE 0.9% FLUSH
10.0000 mL | Freq: Once | INTRAVENOUS | Status: AC
  Administered 2023-09-11: 10 mL

## 2023-09-11 NOTE — Progress Notes (Signed)
 Specialty Pharmacy Refill Coordination Note  [name] [name] is a 78 y.o. female contacted today regarding refills of specialty medication(s) Enasidenib Mesylate [facility])   Patient requested Delivery   Delivery date: 09/16/23   Verified address: Patient address [address]   [place]   Medication will be filled on 09/13/23.  Spoke with Daughter & aware we have to order medication. She will also verify patient phone number looks like we got a message not in service. Message sent to [facility] to order as well.

## 2023-09-11 NOTE — Assessment & Plan Note (Signed)
  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   4 points - IPSS-R Score Intermediate risk Median survival - 3 yrs Median time to 25% AML evolution: 3.2 years   01/07/2023: Bone marrow aspirate: Hypercellular bone marrow 50% cellularity with dysplasia, flow cytometry: 5% CD34 positive blasts, cytogenetics: Normal MDS [employer] panel showed ASXL , CBL, IDH2 and SRSF2.    She was treated with azacitidine but unfortunately 3 days into treatment developed a rash likely secondary to azacitidine.  She then was started on decitabine. Patient has shown clinical and lab response to decitabine. Discussed potential switch to oral therapy with enasidenib, an IDH2 inhibitor, based on MDACC recommendations.   Myelodysplastic syndrome Current medication normalized blood counts but causes significant gastrointestinal side effects, impacting quality of life. Discussed dose reduction to 50 mg to mitigate side effects. Venetoclax considered if reduced dose is intolerable.  - Reduce cancer medication dose to 50 mg daily. - Allow four-day medication break for her birthday. - Prescribe Zofran 30 minutes before cancer pill for nausea. - Consider Imodium (2 mg) for diarrhea management as needed. - Monitor blood counts and side effects post-dose adjustment. - Consult pharmacist about cutting medication tablets.  Diarrhea Diarrhea likely a side effect of cancer medication. Simethicone provided some relief. - Use Imodium (2 mg) as needed for diarrhea, especially on days with planned activities. - Monitor bowel habits during medication break.  Nausea Nausea managed with Zofran and Dramamine. Emphasized continuing anti-nausea medication. - Continue Zofran 30 minutes before cancer pill. - Consider increasing Zofran frequency if nausea  persists.  Headache Headaches potentially related to cancer medication or dehydration. - Use Tylenol as needed for headache relief.  Goals of Care Discussed quality of life concerns and life expectancy with current treatment. She prefers quality of life in treatment decisions. - Discuss treatment options and quality of life considerations with her. - Consider her preferences in future treatment decisions.

## 2023-09-11 NOTE — Progress Notes (Signed)
 [facility] Cancer Follow up:    [name], [name], MD [address] [place] [address]   DIAGNOSIS: Myelodysplastic Syndrome  SUMMARY OF ONCOLOGIC HISTORY: Oncology History  MDS (myelodysplastic syndrome), high grade (HCC)  01/07/2023 Initial Biopsy   Bone marrow biopsy:cellular bone marrow with focal areas of hypercellularity (approximately up to  50%).  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  Blasts appear mildly mildly increased, approximately 5% to focally up to 10%.  Flow cytometric analysis reveals 5% CD34 positive blasts.  While the findings could be attributable to the patient's coexisting conditions, the possibility of a myeloid neoplasm such as myelodysplastic neoplasm with increased blasts cannot be entirely excluded. normal karyotype   01/29/2023 Initial Diagnosis   MDS (myelodysplastic syndrome), high grade (HCC)   02/04/2023 - 02/06/2023 Chemotherapy   Patient is on Treatment Plan : MYELODYSPLASIA  Azacitidine IV D1-5 q28d     03/04/2023 - 07/29/2023 Chemotherapy   Patient is on Treatment Plan : MYELODYSPLASIA Decitabine D1-5 q28d       CURRENT THERAPY: Azacitidine  [name] [name] 78 y.o. female returns for follow-up while on enasidenib.  History of Present Illness    Discussed the use of AI scribe software for clinical note transcription with the patient, who gave verbal consent to proceed.  History of Present Illness    [name] [name] is a 78 year old female with myelodysplasia who presents with gastrointestinal side effects from cancer medication. She is accompanied by Val, a caregiver.  She is currently on a cancer medication for myelodysplasia, taking 100 mg daily. She has a history of using infusions for her condition, which improved her blood counts without significant side effects. However, the current medication has been challenging due to its gastrointestinal side effects.  She has been experiencing gastrointestinal  issues, including nausea and diarrhea, for over a week. Initially, she had constipation, which resolved after taking laxatives, but this was followed by diarrhea. She has diarrhea approximately five times a day, with stools described as 'puddingly.' She has not taken Miralax or any laxatives for over a week. Nausea is a known side effect of the medication. She takes Zofran 30 minutes before her cancer medication, a half dose of Dramamine, and a calcium pill daily. She drinks a significant amount of water daily, including 16 ounces after taking her cancer medication and 8 ounces in the morning. No fevers or chills.  She reports frequent headaches, which she manages with Tylenol. She experiences lightheadedness occasionally.  She has requested physical therapy to improve her strength and balance, which she attends three times a week. She reports increased use of her inhaler due to allergies.  Rest of the pertinent 10 point ROS reviewed and neg.  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) 04/26/2023   Hypokalemia 04/26/2023   Neutropenic fever (HCC) 04/25/2023   Hives 03/18/2023   MDS (myelodysplastic syndrome), high grade (HCC) 01/29/2023   Obesity 03/02/2021   Other long term (current) drug therapy 03/02/2021   Primary osteoarthritis 03/02/2021   SOB (shortness of breath) 12/08/2020   Pericardial effusion 12/08/2020   Dry eyes/dry mouth 05/25/2019   Bronchitis, mucopurulent recurrent (HCC) 02/26/2019   Moderate persistent asthma without complication 02/24/2019   Perennial and seasonal allergic rhinitis 02/24/2019   Seasonal allergic conjunctivitis 02/24/2019   Primary osteoarthritis of both knees 04/10/2016   Lung nodule 08/19/2014   HTN (hypertension) 08/07/2012  HLD (hyperlipidemia) 09/26/2011   Asthma 09/06/2010   Depression 09/06/2010   History of allergy 09/06/2010   Hypothyroidism 09/06/2010   Lower back  pain 09/06/2010   Arthritis 09/06/2010   Pain in joint, pelvic region and thigh 09/06/2010   Peripheral neuropathy 09/06/2010   Psoriasis 09/06/2010   Therapeutic drug monitoring 09/06/2010   Tinnitus 09/06/2010   Vitamin B 12 deficiency 09/06/2010   Cervical spondylosis without myelopathy 04/24/2006   Tear of lateral cartilage or meniscus of knee, current 03/13/2006    is allergic to bee pollen, latex, pollen extract-tree extract [pollen extract], cat dander, dust mite extract, and molds & smuts.  MEDICAL HISTORY: Past Medical History:  Diagnosis Date   Asthma    Diverticulitis    Hypothyroid    MDS (myelodysplastic syndrome) (HCC) 01/2023   Psoriatic arthritis (HCC)     SURGICAL HISTORY: Past Surgical History:  Procedure Laterality Date   COLONOSCOPY  08/2021   HIP SURGERY Bilateral    IR IMAGING GUIDED PORT INSERTION  02/20/2023   TONSILLECTOMY     TUBAL LIGATION      SOCIAL HISTORY: Social History   Socioeconomic History   Marital status: Widowed    Spouse name: Not on file   Number of children: Not on file   Years of education: Not on file   Highest education level: Not on file  Occupational History   Not on file  Tobacco Use   Smoking status: Former    Current packs/day: 0.00    Average packs/day: 0.1 packs/day for 33.4 years (3.3 ttl pk-yrs)    Types: Cigarettes    Start date: 32    Quit date: 11/18/2000    Years since quitting: 22.8   Smokeless tobacco: Never   Tobacco comments:    3 cigarettes a day  Vaping Use   Vaping status: Never Used  Substance and Sexual Activity   Alcohol use: Never   Drug use: Never   Sexual activity: Not Currently  Other Topics Concern   Not on file  Social History Narrative   Lives with children   Social Drivers of Health   Financial Resource Strain: Not on file  Food Insecurity: No Food Insecurity (04/26/2023)   Hunger Vital Sign    Worried About Running Out of Food in the Last Year: Never true    Ran Out of  Food in the Last Year: Never true  Transportation Needs: No Transportation Needs (04/26/2023)   PRAPARE - Administrator, Civil Service (Medical): No    Lack of Transportation (Non-Medical): No  Physical Activity: Not on file  Stress: Not on file  Social Connections: Not on file  Intimate Partner Violence: Not At Risk (04/26/2023)   Humiliation, Afraid, Rape, and Kick questionnaire    Fear of Current or Ex-Partner: No    Emotionally Abused: No    Physically Abused: No    Sexually Abused: No    FAMILY HISTORY: Family History  Problem Relation Age of Onset   Asthma Mother    Allergic rhinitis Mother    Rheum arthritis Mother    Allergic rhinitis Father    Alzheimer's disease Father    Eczema Brother    Allergic rhinitis Brother    Colon cancer Neg Hx    Esophageal cancer Neg Hx    Stomach cancer Neg Hx    Rectal cancer Neg Hx     Review of Systems  Constitutional:  Negative for appetite change, chills, fatigue, fever and unexpected  weight change.  HENT:   Negative for hearing loss, lump/mass and trouble swallowing.   Eyes:  Negative for eye problems and icterus.  Respiratory:  Negative for chest tightness, cough and shortness of breath.   Cardiovascular:  Negative for chest pain, leg swelling and palpitations.  Gastrointestinal:  Negative for abdominal distention, abdominal pain, constipation, diarrhea, nausea and vomiting.  Endocrine: Negative for hot flashes.  Genitourinary:  Negative for difficulty urinating.   Musculoskeletal:  Negative for arthralgias.  Skin:  Negative for itching and rash.  Neurological:  Negative for dizziness, extremity weakness, headaches and numbness.  Hematological:  Negative for adenopathy. Does not bruise/bleed easily.  Psychiatric/Behavioral:  Negative for depression. The patient is not nervous/anxious.       PHYSICAL EXAMINATION     Vitals:   09/11/23 1240  BP: 134/62  Pulse: 79  Resp: 16  Temp: (!) 97.2 F (36.2 C)   SpO2: 97%    She is frustrated and tearful CHEST: Breath sounds clear. Heart: Rate and rhythm regular.  Abdomen: Soft, nontender, nondistended No lower extremity edema, chronic venous stasis changes noted  LABORATORY DATA:  CBC    Component Value Date/Time   WBC 4.0 09/11/2023 1205   RBC 3.27 (L) 09/11/2023 1205   HGB 12.0 09/11/2023 1205   HGB 10.3 (L) 07/29/2023 1227   HCT 36.4 09/11/2023 1205   HCT 39.0 11/24/2022 1035   PLT 179 09/11/2023 1205   PLT 157 07/29/2023 1227   MCV 111.3 (H) 09/11/2023 1205   MCH 36.7 (H) 09/11/2023 1205   MCHC 33.0 09/11/2023 1205   RDW 17.5 (H) 09/11/2023 1205   LYMPHSABS 1.7 09/11/2023 1205   MONOABS 0.4 09/11/2023 1205   EOSABS 0.0 09/11/2023 1205   BASOSABS 0.0 09/11/2023 1205    CMP     Component Value Date/Time   NA 139 09/11/2023 1209   K 4.0 09/11/2023 1209   CL 104 09/11/2023 1209   CO2 26 09/11/2023 1209   GLUCOSE 99 09/11/2023 1209   BUN 10 09/11/2023 1209   CREATININE 0.47 09/11/2023 1209   CALCIUM 8.8 (L) 09/11/2023 1209   PROT 6.7 09/11/2023 1209   ALBUMIN 3.9 09/11/2023 1209   AST 15 09/11/2023 1209   ALT 9 09/11/2023 1209   ALKPHOS 44 09/11/2023 1209   BILITOT 0.7 09/11/2023 1209   GFRNONAA >60 09/11/2023 1209    ASSESSMENT and THERAPY PLAN:   MDS (myelodysplastic syndrome), high grade (HCC)  MDS (myelodysplastic syndrome), high grade (HCC) This is a very pleasant 78 year old female patient with MDS, ASXL1 mutation, CBL, 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   4 points - IPSS-R Score Intermediate risk Median survival - 3 yrs Median time to 25% AML evolution: 3.2 years   01/07/2023: Bone marrow aspirate: Hypercellular bone marrow 50% cellularity with dysplasia, flow cytometry: 5% CD34 positive blasts, cytogenetics: Normal MDS [employer] panel showed ASXL , CBL, IDH2 and SRSF2.    She was treated with azacitidine but unfortunately 3 days into treatment developed a  rash likely secondary to azacitidine.  She then was started on decitabine. Patient has shown clinical and lab response to decitabine. Discussed potential switch to oral therapy with enasidenib, an IDH2 inhibitor, based on MDACC recommendations.   Myelodysplastic syndrome Current medication normalized blood counts but causes significant gastrointestinal side effects, impacting quality of life. Discussed dose reduction to 50 mg to mitigate side effects. Venetoclax considered if reduced dose is intolerable.  - Reduce cancer medication dose to  50 mg daily. - Allow four-day medication break for her birthday. - Prescribe Zofran 30 minutes before cancer pill for nausea. - Consider Imodium (2 mg) for diarrhea management as needed. - Monitor blood counts and side effects post-dose adjustment. - Consult pharmacist about cutting medication tablets.  Diarrhea Diarrhea likely a side effect of cancer medication. Simethicone provided some relief. - Use Imodium (2 mg) as needed for diarrhea, especially on days with planned activities. - Monitor bowel habits during medication break.  Nausea Nausea managed with Zofran and Dramamine. Emphasized continuing anti-nausea medication. - Continue Zofran 30 minutes before cancer pill. - Consider increasing Zofran frequency if nausea persists.  Headache Headaches potentially related to cancer medication or dehydration. - Use Tylenol as needed for headache relief.  Goals of Care Discussed quality of life concerns and life expectancy with current treatment. She prefers quality of life in treatment decisions. - Discuss treatment options and quality of life considerations with her. - Consider her preferences in future treatment decisions.     All questions were answered. The patient knows to call the clinic with any problems, questions or concerns. We can certainly see the patient much sooner if necessary.  Total encounter time:40 minutes*in face-to-face visit  time, chart review, lab review, care coordination, order entry, and documentation of the encounter time.    *Total Encounter Time as defined by the Centers for Medicare and Medicaid Services includes, in addition to the face-to-face time of a patient visit (documented in the note above) non-face-to-face time: obtaining and reviewing outside history, ordering and reviewing medications, tests or procedures, care coordination (communications with other health care professionals or caregivers) and documentation in the medical record.

## 2023-09-12 ENCOUNTER — Other Ambulatory Visit (HOSPITAL_COMMUNITY): Payer: Self-pay

## 2023-09-12 ENCOUNTER — Telehealth: Payer: Self-pay | Admitting: Hematology and Oncology

## 2023-09-12 NOTE — Telephone Encounter (Signed)
 Spoke with patient confirming upcoming appointment

## 2023-09-13 ENCOUNTER — Other Ambulatory Visit: Payer: Self-pay

## 2023-09-13 ENCOUNTER — Other Ambulatory Visit (HOSPITAL_COMMUNITY): Payer: Self-pay

## 2023-09-15 ENCOUNTER — Encounter: Payer: Self-pay | Admitting: Allergy & Immunology

## 2023-09-16 MED ORDER — AZELASTINE-FLUTICASONE 137-50 MCG/ACT NA SUSP: 1.0000 | Freq: Two times a day (BID) | NASAL | 5 refills | Status: DC | PRN

## 2023-09-17 ENCOUNTER — Encounter: Payer: Self-pay | Admitting: Hematology and Oncology

## 2023-09-18 ENCOUNTER — Other Ambulatory Visit: Payer: Self-pay | Admitting: *Deleted

## 2023-09-18 ENCOUNTER — Ambulatory Visit: Admitting: Hematology and Oncology

## 2023-09-18 ENCOUNTER — Other Ambulatory Visit

## 2023-09-20 ENCOUNTER — Other Ambulatory Visit (HOSPITAL_COMMUNITY): Payer: Self-pay

## 2023-10-03 ENCOUNTER — Other Ambulatory Visit (HOSPITAL_COMMUNITY): Payer: Self-pay

## 2023-10-03 ENCOUNTER — Other Ambulatory Visit: Payer: Self-pay

## 2023-10-03 NOTE — Progress Notes (Signed)
 Specialty Pharmacy Refill Coordination Note  [name] [name] is a 78 y.o. female contacted today regarding refills of specialty medication(s) Idhifa.  Patient requested (Patient-Rptd) Delivery   Delivery date: (Patient-Rptd) 10/10/23   Verified address: (Patient-Rptd) [address] [name], [place], [place] [address]   Medication will be filled on 10/09/23.

## 2023-10-08 ENCOUNTER — Telehealth: Payer: Self-pay

## 2023-10-08 NOTE — Telephone Encounter (Signed)
 Spoke with patient and confirmed appointment on 4/23

## 2023-10-09 ENCOUNTER — Inpatient Hospital Stay: Attending: Hematology and Oncology

## 2023-10-09 ENCOUNTER — Other Ambulatory Visit: Payer: Self-pay

## 2023-10-09 ENCOUNTER — Inpatient Hospital Stay (HOSPITAL_BASED_OUTPATIENT_CLINIC_OR_DEPARTMENT_OTHER): Admitting: Hematology and Oncology

## 2023-10-09 VITALS — BP 137/74 | HR 77 | Temp 97.6°F | Resp 16 | Wt 198.1 lb

## 2023-10-09 DIAGNOSIS — D61810 Antineoplastic chemotherapy induced pancytopenia: Secondary | ICD-10-CM | POA: Insufficient documentation

## 2023-10-09 DIAGNOSIS — Z79899 Other long term (current) drug therapy: Secondary | ICD-10-CM | POA: Insufficient documentation

## 2023-10-09 DIAGNOSIS — D469 Myelodysplastic syndrome, unspecified: Secondary | ICD-10-CM | POA: Diagnosis present

## 2023-10-09 DIAGNOSIS — D46Z Other myelodysplastic syndromes: Secondary | ICD-10-CM

## 2023-10-09 DIAGNOSIS — L409 Psoriasis, unspecified: Secondary | ICD-10-CM | POA: Diagnosis not present

## 2023-10-09 DIAGNOSIS — D696 Thrombocytopenia, unspecified: Secondary | ICD-10-CM

## 2023-10-09 DIAGNOSIS — D61818 Other pancytopenia: Secondary | ICD-10-CM

## 2023-10-09 DIAGNOSIS — E876 Hypokalemia: Secondary | ICD-10-CM | POA: Diagnosis not present

## 2023-10-09 DIAGNOSIS — T451X5A Adverse effect of antineoplastic and immunosuppressive drugs, initial encounter: Secondary | ICD-10-CM | POA: Insufficient documentation

## 2023-10-09 DIAGNOSIS — R1310 Dysphagia, unspecified: Secondary | ICD-10-CM | POA: Diagnosis not present

## 2023-10-09 LAB — CBC WITH DIFFERENTIAL (CANCER CENTER ONLY)
Abs Immature Granulocytes: 0.01 10*3/uL (ref 0.00–0.07)
Basophils Absolute: 0 10*3/uL (ref 0.0–0.1)
Basophils Relative: 1 %
Eosinophils Absolute: 0.1 10*3/uL (ref 0.0–0.5)
Eosinophils Relative: 2 %
HCT: 38.8 % (ref 36.0–46.0)
Hemoglobin: 12.9 g/dL (ref 12.0–15.0)
Immature Granulocytes: 0 %
Lymphocytes Relative: 47 %
Lymphs Abs: 1.6 10*3/uL (ref 0.7–4.0)
MCH: 35.5 pg — ABNORMAL HIGH (ref 26.0–34.0)
MCHC: 33.2 g/dL (ref 30.0–36.0)
MCV: 106.9 fL — ABNORMAL HIGH (ref 80.0–100.0)
Monocytes Absolute: 0.5 10*3/uL (ref 0.1–1.0)
Monocytes Relative: 14 %
Neutro Abs: 1.2 10*3/uL — ABNORMAL LOW (ref 1.7–7.7)
Neutrophils Relative %: 36 %
Platelet Count: 164 10*3/uL (ref 150–400)
RBC: 3.63 MIL/uL — ABNORMAL LOW (ref 3.87–5.11)
RDW: 16.2 % — ABNORMAL HIGH (ref 11.5–15.5)
WBC Count: 3.4 10*3/uL — ABNORMAL LOW (ref 4.0–10.5)
nRBC: 0 % (ref 0.0–0.2)

## 2023-10-09 LAB — CMP (CANCER CENTER ONLY)
ALT: 8 U/L (ref 0–44)
AST: 14 U/L — ABNORMAL LOW (ref 15–41)
Albumin: 4.2 g/dL (ref 3.5–5.0)
Alkaline Phosphatase: 45 U/L (ref 38–126)
Anion gap: 5 (ref 5–15)
BUN: 11 mg/dL (ref 8–23)
CO2: 29 mmol/L (ref 22–32)
Calcium: 9.3 mg/dL (ref 8.9–10.3)
Chloride: 104 mmol/L (ref 98–111)
Creatinine: 0.49 mg/dL (ref 0.44–1.00)
GFR, Estimated: >60 mL/min (ref >60)
Glucose, Bld: 97 mg/dL (ref 70–99)
Potassium: 4.4 mmol/L (ref 3.5–5.1)
Sodium: 138 mmol/L (ref 135–145)
Total Bilirubin: 0.6 mg/dL (ref 0.0–1.2)
Total Protein: 6.9 g/dL (ref 6.5–8.1)

## 2023-10-09 LAB — SAMPLE TO BLOOD BANK

## 2023-10-09 MED ORDER — ONDANSETRON HCL 8 MG PO TABS: 8.0000 mg | ORAL_TABLET | Freq: Three times a day (TID) | ORAL | 1 refills | Status: DC | PRN

## 2023-10-09 NOTE — Assessment & Plan Note (Signed)
 Assessment and Plan Assessment & Plan  MDS on IDHIFA  50 mg daily Tolerating this dose well except for psoriasis flare up No indication for transfusion.  Active squamous cell carcinoma of skin. Recently diagnosed, excised by dermatologist. Awaiting biopsy results.  Neutropenia Mild neutropenia with WBC 3,400 and neutrophil count 1,200. Improved on IDHIFA  Hemoglobin and platelet counts normal.  Active psoriasis Active psoriasis, possibly exacerbated by improved immunity. Treated with Simponi Aria infusions every two months. - Monitor response to next Simponi Aria infusion. - Discuss potential treatment modifications with rheumatologist, Dr. Dannie Duval.  Psoriatic arthritis Managed with Simponi Aria infusions every two months. Rheumatologist follow-up every 4-6 weeks. - Consult with rheumatologist, Dr. Dannie Duval, regarding potential treatment modifications.  Nausea due to medication Nausea managed with Zofran , taken daily. No constipation reported. - Refill Zofran  prescription and send to [facility].

## 2023-10-09 NOTE — Progress Notes (Signed)
 [facility] Cancer Follow up:    [name], [name] Pfeiffer, MD [address] [place] [address]   DIAGNOSIS: Myelodysplastic Syndrome  SUMMARY OF ONCOLOGIC HISTORY: Oncology History  MDS (myelodysplastic syndrome), high grade (HCC)  01/07/2023 Initial Biopsy   Bone marrow biopsy:cellular bone marrow with focal areas of hypercellularity (approximately up to  50%).  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  Blasts appear mildly mildly increased, approximately 5% to focally up to 10%.  Flow cytometric analysis reveals 5% CD34 positive blasts.  While the findings could be attributable to the patient's coexisting conditions, the possibility of a myeloid neoplasm such as myelodysplastic neoplasm with increased blasts cannot be entirely excluded. normal karyotype   01/29/2023 Initial Diagnosis   MDS (myelodysplastic syndrome), high grade (HCC)   02/04/2023 - 02/06/2023 Chemotherapy   Patient is on Treatment Plan : MYELODYSPLASIA  Azacitidine  IV D1-5 q28d     03/04/2023 - 07/29/2023 Chemotherapy   Patient is on Treatment Plan : MYELODYSPLASIA Decitabine  D1-5 q28d       CURRENT THERAPY: Azacitidine   [name] [name] 77 y.o. female returns for follow-up while on enasidenib.  Discussed the use of AI scribe software for clinical note transcription with the patient, who gave verbal consent to proceed.  History of Present Illness [name] [name] is a 78 year old female with psoriasis and psoriatic arthritis who presents with an active psoriasis flare.  She recently visited a dermatologist for the removal of a squamous cell carcinoma. She describes a widespread rash, including on her head, resembling previous psoriasis outbreaks, indicating an active flare despite ongoing treatment. Her psoriasis was initially controlled with Simponi Aria infusions every two months, but it is currently active. Her next infusion is scheduled for the second week of May.  Her medication regimen  includes Idhifa  50 mg daily, which causes nausea managed with daily Zofran . She reports no constipation from Zofran  and requests a refill from her pharmacy.  She describes persistent soreness below her right shoulder blade for over a month, exacerbated by certain movements like leaning back or stretching. She is undergoing physical therapy to strengthen herself, particularly concerned about her knee.  No chest pain or shortness of breath. She reports regular bowel movements without the need for Miralax .  Rest of the pertinent 10 point ROS reviewed and neg.  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) 04/26/2023   Hypokalemia 04/26/2023   Neutropenic fever (HCC) 04/25/2023   Hives 03/18/2023   MDS (myelodysplastic syndrome), high grade (HCC) 01/29/2023   Obesity 03/02/2021   Other long term (current) drug therapy 03/02/2021   Primary osteoarthritis 03/02/2021   SOB (shortness of breath) 12/08/2020   Pericardial effusion 12/08/2020   Dry eyes/dry mouth 05/25/2019   Bronchitis, mucopurulent recurrent (HCC) 02/26/2019   Moderate persistent asthma without complication 02/24/2019   Perennial and seasonal allergic rhinitis 02/24/2019   Seasonal allergic conjunctivitis 02/24/2019   Primary osteoarthritis of both knees 04/10/2016   Lung nodule 08/19/2014   HTN (hypertension) 08/07/2012   HLD (hyperlipidemia) 09/26/2011   Asthma 09/06/2010   Depression 09/06/2010   History of allergy 09/06/2010   Hypothyroidism 09/06/2010   Lower back pain 09/06/2010   Arthritis 09/06/2010   Pain in joint, pelvic region and thigh 09/06/2010   Peripheral neuropathy 09/06/2010   Psoriasis 09/06/2010   Therapeutic drug monitoring 09/06/2010   Tinnitus 09/06/2010   Vitamin B 12 deficiency 09/06/2010  Cervical spondylosis without myelopathy 04/24/2006   Tear of lateral cartilage or meniscus of knee, current 03/13/2006    is  allergic to bee pollen, latex, pollen extract-tree extract [pollen extract], cat dander, dust mite extract, and molds & smuts.  MEDICAL HISTORY: Past Medical History:  Diagnosis Date   Asthma    Diverticulitis    Hypothyroid    MDS (myelodysplastic syndrome) (HCC) 01/2023   Psoriatic arthritis (HCC)     SURGICAL HISTORY: Past Surgical History:  Procedure Laterality Date   COLONOSCOPY  08/2021   HIP SURGERY Bilateral    IR IMAGING GUIDED PORT INSERTION  02/20/2023   TONSILLECTOMY     TUBAL LIGATION      SOCIAL HISTORY: Social History   Socioeconomic History   Marital status: Widowed    Spouse name: Not on file   Number of children: Not on file   Years of education: Not on file   Highest education level: Not on file  Occupational History   Not on file  Tobacco Use   Smoking status: Former    Current packs/day: 0.00    Average packs/day: 0.1 packs/day for 33.4 years (3.3 ttl pk-yrs)    Types: Cigarettes    Start date: 89    Quit date: 11/18/2000    Years since quitting: 22.9   Smokeless tobacco: Never   Tobacco comments:    3 cigarettes a day  Vaping Use   Vaping status: Never Used  Substance and Sexual Activity   Alcohol use: Never   Drug use: Never   Sexual activity: Not Currently  Other Topics Concern   Not on file  Social History Narrative   Lives with children   Social Drivers of Health   Financial Resource Strain: Not on file  Food Insecurity: No Food Insecurity (04/26/2023)   Hunger Vital Sign    Worried About Running Out of Food in the Last Year: Never true    Ran Out of Food in the Last Year: Never true  Transportation Needs: No Transportation Needs (04/26/2023)   PRAPARE - Administrator, Civil Service (Medical): No    Lack of Transportation (Non-Medical): No  Physical Activity: Not on file  Stress: Not on file  Social Connections: Not on file  Intimate Partner Violence: Not At Risk (04/26/2023)   Humiliation, Afraid, Rape, and Kick  questionnaire    Fear of Current or Ex-Partner: No    Emotionally Abused: No    Physically Abused: No    Sexually Abused: No    FAMILY HISTORY: Family History  Problem Relation Age of Onset   Asthma Mother    Allergic rhinitis Mother    Rheum arthritis Mother    Allergic rhinitis Father    Alzheimer's disease Father    Eczema Brother    Allergic rhinitis Brother    Colon cancer Neg Hx    Esophageal cancer Neg Hx    Stomach cancer Neg Hx    Rectal cancer Neg Hx     Review of Systems  Constitutional:  Negative for appetite change, chills, fatigue, fever and unexpected weight change.  HENT:   Negative for hearing loss, lump/mass and trouble swallowing.   Eyes:  Negative for eye problems and icterus.  Respiratory:  Negative for chest tightness, cough and shortness of breath.   Cardiovascular:  Negative for chest pain, leg swelling and palpitations.  Gastrointestinal:  Negative for abdominal distention, abdominal pain, constipation, diarrhea, nausea and vomiting.  Endocrine: Negative for hot flashes.  Genitourinary:  Negative for difficulty urinating.   Musculoskeletal:  Negative for arthralgias.  Skin:  Negative for itching and rash.  Neurological:  Negative for dizziness, extremity weakness, headaches and numbness.  Hematological:  Negative for adenopathy. Does not bruise/bleed easily.  Psychiatric/Behavioral:  Negative for depression. The patient is not nervous/anxious.       PHYSICAL EXAMINATION     Vitals:   10/09/23 1416  BP: 137/74  Pulse: 77  Resp: 16  Temp: 97.6 F (36.4 C)  SpO2: 97%    She is frustrated and tearful CHEST: Breath sounds clear. Heart: Rate and rhythm regular.  Abdomen: Soft, nontender, nondistended Mild LE edema , chronic venous stasis changes. Skin rash diffuse noted, likely outbreak of psoriasis  LABORATORY DATA:  CBC    Component Value Date/Time   WBC 3.4 (L) 10/09/2023 1354   WBC 4.0 09/11/2023 1205   RBC 3.63 (L)  10/09/2023 1354   HGB 12.9 10/09/2023 1354   HCT 38.8 10/09/2023 1354   HCT 39.0 11/24/2022 1035   PLT 164 10/09/2023 1354   MCV 106.9 (H) 10/09/2023 1354   MCH 35.5 (H) 10/09/2023 1354   MCHC 33.2 10/09/2023 1354   RDW 16.2 (H) 10/09/2023 1354   LYMPHSABS 1.6 10/09/2023 1354   MONOABS 0.5 10/09/2023 1354   EOSABS 0.1 10/09/2023 1354   BASOSABS 0.0 10/09/2023 1354    CMP     Component Value Date/Time   NA 138 10/09/2023 1354   K 4.4 10/09/2023 1354   CL 104 10/09/2023 1354   CO2 29 10/09/2023 1354   GLUCOSE 97 10/09/2023 1354   BUN 11 10/09/2023 1354   CREATININE 0.49 10/09/2023 1354   CALCIUM 9.3 10/09/2023 1354   PROT 6.9 10/09/2023 1354   ALBUMIN 4.2 10/09/2023 1354   AST 14 (L) 10/09/2023 1354   ALT 8 10/09/2023 1354   ALKPHOS 45 10/09/2023 1354   BILITOT 0.6 10/09/2023 1354   GFRNONAA >60 10/09/2023 1354    ASSESSMENT and THERAPY PLAN:   MDS (myelodysplastic syndrome), high grade (HCC) Assessment and Plan Assessment & Plan  MDS on IDHIFA  50 mg daily Tolerating this dose well except for psoriasis flare up No indication for transfusion.  Active squamous cell carcinoma of skin. Recently diagnosed, excised by dermatologist. Awaiting biopsy results.  Neutropenia Mild neutropenia with WBC 3,400 and neutrophil count 1,200. Improved on IDHIFA  Hemoglobin and platelet counts normal.  Active psoriasis Active psoriasis, possibly exacerbated by improved immunity. Treated with Simponi Aria infusions every two months. - Monitor response to next Simponi Aria infusion. - Discuss potential treatment modifications with rheumatologist, Dr. Dannie Duval.  Psoriatic arthritis Managed with Simponi Aria infusions every two months. Rheumatologist follow-up every 4-6 weeks. - Consult with rheumatologist, Dr. Dannie Duval, regarding potential treatment modifications.  Nausea due to medication Nausea managed with Zofran , taken daily. No constipation reported. - Refill Zofran   prescription and send to [facility].       All questions were answered. The patient knows to call the clinic with any problems, questions or concerns. We can certainly see the patient much sooner if necessary.  Total encounter time:30 minutes*in face-to-face visit time, chart review, lab review, care coordination, order entry, and documentation of the encounter time.    *Total Encounter Time as defined by the Centers for Medicare and Medicaid Services includes, in addition to the face-to-face time of a patient visit (documented in the note above) non-face-to-face time: obtaining and reviewing outside history, ordering and reviewing medications, tests or procedures, care coordination (  communications with other health care professionals or caregivers) and documentation in the medical record.

## 2023-10-16 ENCOUNTER — Other Ambulatory Visit (HOSPITAL_COMMUNITY): Payer: Self-pay

## 2023-11-05 ENCOUNTER — Other Ambulatory Visit: Payer: Self-pay

## 2023-11-05 NOTE — Progress Notes (Signed)
 Specialty Pharmacy Refill Coordination Note  [name] [name] is a 78 y.o. female contacted today regarding refills of specialty medication(s) Enasidenib Mesylate  (IDHIFA )   Patient requested Delivery   Delivery date: 11/08/23   Verified address: [address] [name], [place], [place] [address]   Medication will be filled on 11/07/23.

## 2023-11-05 NOTE — Progress Notes (Signed)
 Specialty Pharmacy Ongoing Clinical Assessment Note  [name] [name] is a 78 y.o. female who is being followed by the specialty pharmacy service for RxSp Oncology   Patient's specialty medication(s) reviewed today: Enasidenib Mesylate  (IDHIFA )   Missed doses in the last 4 weeks: 0   Patient/Caregiver did not have any additional questions or concerns.   Therapeutic benefit summary: Patient is achieving benefit   Adverse events/side effects summary: Experienced adverse events/side effects (continued diarrhea, nausea, fatigue)   Patient's therapy is appropriate to: Continue    Goals Addressed             This Visit's Progress    Maintain optimal adherence to therapy       Patient is on track. Patient will maintain adherence          Follow up: 3 months  Basel Defalco M [facility]itt Specialty Pharmacist

## 2023-11-12 ENCOUNTER — Inpatient Hospital Stay (HOSPITAL_BASED_OUTPATIENT_CLINIC_OR_DEPARTMENT_OTHER): Admitting: Adult Health

## 2023-11-12 ENCOUNTER — Inpatient Hospital Stay: Attending: Hematology and Oncology

## 2023-11-12 ENCOUNTER — Encounter: Payer: Self-pay | Admitting: Adult Health

## 2023-11-12 VITALS — BP 145/58 | HR 56 | Temp 97.9°F | Resp 18 | Ht 68.0 in | Wt 202.7 lb

## 2023-11-12 DIAGNOSIS — J4540 Moderate persistent asthma, uncomplicated: Secondary | ICD-10-CM | POA: Diagnosis not present

## 2023-11-12 DIAGNOSIS — Z87891 Personal history of nicotine dependence: Secondary | ICD-10-CM | POA: Insufficient documentation

## 2023-11-12 DIAGNOSIS — D696 Thrombocytopenia, unspecified: Secondary | ICD-10-CM

## 2023-11-12 DIAGNOSIS — Z91030 Bee allergy status: Secondary | ICD-10-CM | POA: Diagnosis not present

## 2023-11-12 DIAGNOSIS — L4050 Arthropathic psoriasis, unspecified: Secondary | ICD-10-CM | POA: Diagnosis not present

## 2023-11-12 DIAGNOSIS — Z9089 Acquired absence of other organs: Secondary | ICD-10-CM | POA: Diagnosis not present

## 2023-11-12 DIAGNOSIS — Z79899 Other long term (current) drug therapy: Secondary | ICD-10-CM | POA: Diagnosis not present

## 2023-11-12 DIAGNOSIS — D46Z Other myelodysplastic syndromes: Secondary | ICD-10-CM | POA: Diagnosis not present

## 2023-11-12 DIAGNOSIS — Z8261 Family history of arthritis: Secondary | ICD-10-CM | POA: Diagnosis not present

## 2023-11-12 DIAGNOSIS — D469 Myelodysplastic syndrome, unspecified: Secondary | ICD-10-CM | POA: Insufficient documentation

## 2023-11-12 DIAGNOSIS — Z818 Family history of other mental and behavioral disorders: Secondary | ICD-10-CM | POA: Insufficient documentation

## 2023-11-12 DIAGNOSIS — Z825 Family history of asthma and other chronic lower respiratory diseases: Secondary | ICD-10-CM | POA: Diagnosis not present

## 2023-11-12 LAB — CBC WITH DIFFERENTIAL/PLATELET
Abs Immature Granulocytes: 0.04 10*3/uL (ref 0.00–0.07)
Basophils Absolute: 0 10*3/uL (ref 0.0–0.1)
Basophils Relative: 1 %
Eosinophils Absolute: 0 10*3/uL (ref 0.0–0.5)
Eosinophils Relative: 0 %
HCT: 34.8 % — ABNORMAL LOW (ref 36.0–46.0)
Hemoglobin: 11.9 g/dL — ABNORMAL LOW (ref 12.0–15.0)
Immature Granulocytes: 1 %
Lymphocytes Relative: 39 %
Lymphs Abs: 1.3 10*3/uL (ref 0.7–4.0)
MCH: 35.2 pg — ABNORMAL HIGH (ref 26.0–34.0)
MCHC: 34.2 g/dL (ref 30.0–36.0)
MCV: 103 fL — ABNORMAL HIGH (ref 80.0–100.0)
Monocytes Absolute: 0.4 10*3/uL (ref 0.1–1.0)
Monocytes Relative: 10 %
Neutro Abs: 1.6 10*3/uL — ABNORMAL LOW (ref 1.7–7.7)
Neutrophils Relative %: 49 %
Platelets: 160 10*3/uL (ref 150–400)
RBC: 3.38 MIL/uL — ABNORMAL LOW (ref 3.87–5.11)
RDW: 15.9 % — ABNORMAL HIGH (ref 11.5–15.5)
WBC: 3.4 10*3/uL — ABNORMAL LOW (ref 4.0–10.5)
nRBC: 0 % (ref 0.0–0.2)

## 2023-11-12 MED ORDER — HEPARIN SOD (PORK) LOCK FLUSH 100 UNIT/ML IV SOLN
250.0000 [IU] | Freq: Once | INTRAVENOUS | Status: AC
Start: 2023-11-12 — End: 2023-11-12
  Administered 2023-11-12: 250 [IU]

## 2023-11-12 MED ORDER — SODIUM CHLORIDE 0.9% FLUSH
10.0000 mL | Freq: Once | INTRAVENOUS | Status: AC
Start: 2023-11-12 — End: 2023-11-12
  Administered 2023-11-12: 10 mL

## 2023-11-12 NOTE — Progress Notes (Signed)
 [facility] Cancer Follow up:    [name], [name], MD [address] [place] [address]   DIAGNOSIS: MDS   SUMMARY OF ONCOLOGIC HISTORY: Oncology History  MDS (myelodysplastic syndrome), high grade (HCC)  01/07/2023 Initial Biopsy   Bone marrow biopsy:cellular bone marrow with focal areas of hypercellularity (approximately up to  50%).  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  Blasts appear mildly mildly increased, approximately 5% to focally up to 10%.  Flow cytometric analysis reveals 5% CD34 positive blasts.  While the findings could be attributable to the patient's coexisting conditions, the possibility of a myeloid neoplasm such as myelodysplastic neoplasm with increased blasts cannot be entirely excluded. normal karyotype   01/29/2023 Initial Diagnosis   MDS (myelodysplastic syndrome), high grade (HCC)   02/04/2023 - 02/06/2023 Chemotherapy   Patient is on Treatment Plan : MYELODYSPLASIA  Azacitidine  IV D1-5 q28d     03/04/2023 - 07/29/2023 Chemotherapy   Patient is on Treatment Plan : MYELODYSPLASIA Decitabine  D1-5 q28d       CURRENT THERAPY: idhifa   INTERVAL HISTORY:  Discussed the use of AI scribe software for clinical note transcription with the patient, who gave verbal consent to proceed.  History of Present Illness [name] [name] is a 78 year old female with myelodysplastic syndrome who presents for follow-up.  She has been managing myelodysplastic syndrome and was previously on a medication regimen of Idhifa  100 mg daily, which caused significant side effects such as fatigue, nausea, and diarrhea. Her medication was paused for a week at the end of March, coinciding with her birthday. Subsequently, the dose was reduced by half, leading to a significant improvement in her symptoms.  She is otherwise feeling well today other than noting a small amount of blood on her pillow this morning and she is uncertain its origin.    Patient Active  Problem List   Diagnosis Date Noted   Dysphagia 04/28/2023   Pancytopenia due to chemotherapy (HCC) 04/26/2023   Hypokalemia 04/26/2023   Neutropenic fever (HCC) 04/25/2023   Hives 03/18/2023   MDS (myelodysplastic syndrome), high grade (HCC) 01/29/2023   Obesity 03/02/2021   Other long term (current) drug therapy 03/02/2021   Primary osteoarthritis 03/02/2021   SOB (shortness of breath) 12/08/2020   Pericardial effusion 12/08/2020   Dry eyes/dry mouth 05/25/2019   Bronchitis, mucopurulent recurrent (HCC) 02/26/2019   Moderate persistent asthma without complication 02/24/2019   Perennial and seasonal allergic rhinitis 02/24/2019   Seasonal allergic conjunctivitis 02/24/2019   Primary osteoarthritis of both knees 04/10/2016   Lung nodule 08/19/2014   HTN (hypertension) 08/07/2012   HLD (hyperlipidemia) 09/26/2011   Asthma 09/06/2010   Depression 09/06/2010   History of allergy 09/06/2010   Hypothyroidism 09/06/2010   Lower back pain 09/06/2010   Arthritis 09/06/2010   Pain in joint, pelvic region and thigh 09/06/2010   Peripheral neuropathy 09/06/2010   Psoriasis 09/06/2010   Therapeutic drug monitoring 09/06/2010   Tinnitus 09/06/2010   Vitamin B 12 deficiency 09/06/2010   Cervical spondylosis without myelopathy 04/24/2006   Tear of lateral cartilage or meniscus of knee, current 03/13/2006    is allergic to bee pollen, latex, pollen extract-tree extract [pollen extract], cat dander, dust mite extract, and molds & smuts.  MEDICAL HISTORY: Past Medical History:  Diagnosis Date   Asthma    Diverticulitis    Hypothyroid    MDS (myelodysplastic syndrome) (HCC) 01/2023   Psoriatic arthritis (HCC)     SURGICAL HISTORY: Past Surgical History:  Procedure Laterality Date   COLONOSCOPY  08/2021   HIP SURGERY Bilateral    IR IMAGING GUIDED PORT INSERTION  02/20/2023   TONSILLECTOMY     TUBAL LIGATION      SOCIAL HISTORY: Social History   Socioeconomic History    Marital status: Widowed    Spouse name: Not on file   Number of children: Not on file   Years of education: Not on file   Highest education level: Not on file  Occupational History   Not on file  Tobacco Use   Smoking status: Former    Current packs/day: 0.00    Average packs/day: 0.1 packs/day for 33.4 years (3.3 ttl pk-yrs)    Types: Cigarettes    Start date: 22    Quit date: 11/18/2000    Years since quitting: 22.9   Smokeless tobacco: Never   Tobacco comments:    3 cigarettes a day  Vaping Use   Vaping status: Never Used  Substance and Sexual Activity   Alcohol use: Never   Drug use: Never   Sexual activity: Not Currently  Other Topics Concern   Not on file  Social History Narrative   Lives with children   Social Drivers of Health   Financial Resource Strain: Not on file  Food Insecurity: No Food Insecurity (04/26/2023)   Hunger Vital Sign    Worried About Running Out of Food in the Last Year: Never true    Ran Out of Food in the Last Year: Never true  Transportation Needs: No Transportation Needs (04/26/2023)   PRAPARE - Administrator, Civil Service (Medical): No    Lack of Transportation (Non-Medical): No  Physical Activity: Not on file  Stress: Not on file  Social Connections: Not on file  Intimate Partner Violence: Not At Risk (04/26/2023)   Humiliation, Afraid, Rape, and Kick questionnaire    Fear of Current or Ex-Partner: No    Emotionally Abused: No    Physically Abused: No    Sexually Abused: No    FAMILY HISTORY: Family History  Problem Relation Age of Onset   Asthma Mother    Allergic rhinitis Mother    Rheum arthritis Mother    Allergic rhinitis Father    Alzheimer's disease Father    Eczema Brother    Allergic rhinitis Brother    Colon cancer Neg Hx    Esophageal cancer Neg Hx    Stomach cancer Neg Hx    Rectal cancer Neg Hx     Review of Systems  Constitutional:  Negative for appetite change, chills, fatigue, fever and  unexpected weight change.  HENT:   Negative for hearing loss, lump/mass and trouble swallowing.   Eyes:  Negative for eye problems and icterus.  Respiratory:  Negative for chest tightness, cough and shortness of breath.   Cardiovascular:  Negative for chest pain, leg swelling and palpitations.  Gastrointestinal:  Negative for abdominal distention, abdominal pain, constipation, diarrhea, nausea and vomiting.  Endocrine: Negative for hot flashes.  Genitourinary:  Negative for difficulty urinating.   Musculoskeletal:  Negative for arthralgias.  Skin:  Negative for itching and rash.  Neurological:  Negative for dizziness, extremity weakness, headaches and numbness.  Hematological:  Negative for adenopathy. Does not bruise/bleed easily.  Psychiatric/Behavioral:  Negative for depression. The patient is not nervous/anxious.       PHYSICAL EXAMINATION    Vitals:   11/12/23 1024  BP: (!) 145/58  Pulse: (!) 56  Resp: 18  Temp: 97.9  F (36.6 C)  SpO2: 100%    Physical Exam Constitutional:      General: She is not in acute distress.    Appearance: Normal appearance. She is not toxic-appearing.  HENT:     Head: Normocephalic and atraumatic.     Right Ear: Ear canal normal.     Left Ear: Ear canal normal.     Mouth/Throat:     Mouth: Mucous membranes are moist.     Pharynx: Oropharynx is clear. No oropharyngeal exudate or posterior oropharyngeal erythema.  Eyes:     General: No scleral icterus. Cardiovascular:     Rate and Rhythm: Normal rate and regular rhythm.     Pulses: Normal pulses.     Heart sounds: Normal heart sounds.  Pulmonary:     Effort: Pulmonary effort is normal.     Breath sounds: Normal breath sounds.  Abdominal:     General: Abdomen is flat. Bowel sounds are normal. There is no distension.     Palpations: Abdomen is soft.     Tenderness: There is no abdominal tenderness.  Musculoskeletal:        General: No swelling.     Cervical back: Neck supple.   Lymphadenopathy:     Cervical: No cervical adenopathy.  Skin:    General: Skin is warm and dry.     Findings: No rash.  Neurological:     General: No focal deficit present.     Mental Status: She is alert.  Psychiatric:        Mood and Affect: Mood normal.        Behavior: Behavior normal.     LABORATORY DATA:  CBC    Component Value Date/Time   WBC 3.4 (L) 11/12/2023 0956   RBC 3.38 (L) 11/12/2023 0956   HGB 11.9 (L) 11/12/2023 0956   HGB 12.9 10/09/2023 1354   HCT 34.8 (L) 11/12/2023 0956   HCT 39.0 11/24/2022 1035   PLT 160 11/12/2023 0956   PLT 164 10/09/2023 1354   MCV 103.0 (H) 11/12/2023 0956   MCH 35.2 (H) 11/12/2023 0956   MCHC 34.2 11/12/2023 0956   RDW 15.9 (H) 11/12/2023 0956   LYMPHSABS 1.3 11/12/2023 0956   MONOABS 0.4 11/12/2023 0956   EOSABS 0.0 11/12/2023 0956   BASOSABS 0.0 11/12/2023 0956    CMP     Component Value Date/Time   NA 138 10/09/2023 1354   K 4.4 10/09/2023 1354   CL 104 10/09/2023 1354   CO2 29 10/09/2023 1354   GLUCOSE 97 10/09/2023 1354   BUN 11 10/09/2023 1354   CREATININE 0.49 10/09/2023 1354   CALCIUM 9.3 10/09/2023 1354   PROT 6.9 10/09/2023 1354   ALBUMIN 4.2 10/09/2023 1354   AST 14 (L) 10/09/2023 1354   ALT 8 10/09/2023 1354   ALKPHOS 45 10/09/2023 1354   BILITOT 0.6 10/09/2023 1354   GFRNONAA >60 10/09/2023 1354     ASSESSMENT and THERAPY PLAN:   MDS (myelodysplastic syndrome), high grade (HCC) Assessment and Plan Assessment & Plan Assessment and Plan Assessment & Plan Myelodysplastic syndromes (MDS) MDS is stable with satisfactory blood counts (reviewed today) and normal liver and kidney functions (from Dr. [name] appointment earlier this month) - Continue current treatment regimen. - Monitor blood counts regularly. - Ensure orders for future labs with CBC And CMET are in place.  Psoriatic arthritis Managed with bi-monthly infusions. Recent psoriasis flare-up improved post-infusion. - Continue with  scheduled infusions every two months. - Consult rheumatologist for any  adjustments needed.  Allergic rhinitis Contributes to sinus issues with minor bleeding, no active bleeding source found. - Use Breztri  inhaler as needed for allergy symptoms. - continue with sympomatic management of allergies.   RTC in 4 weeks for labs and f/u.      All questions were answered. The patient knows to call the clinic with any problems, questions or concerns. We can certainly see the patient much sooner if necessary.  Total encounter time:20 minutes*in face-to-face visit time, chart review, lab review, care coordination, order entry, and documentation of the encounter time.    Alwin Baars, NP 11/12/23 11:10 AM Medical Oncology and Hematology [facility] [address] [place], [place] [address] Tel. [phone]    Fax. [phone]  *Total Encounter Time as defined by the Centers for Medicare and Medicaid Services includes, in addition to the face-to-face time of a patient visit (documented in the note above) non-face-to-face time: obtaining and reviewing outside history, ordering and reviewing medications, tests or procedures, care coordination (communications with other health care professionals or caregivers) and documentation in the medical record.

## 2023-11-12 NOTE — Assessment & Plan Note (Addendum)
 Assessment and Plan Assessment & Plan Assessment and Plan Assessment & Plan Myelodysplastic syndromes (MDS) MDS is stable with satisfactory blood counts (reviewed today) and normal liver and kidney functions (from Dr. [name] appointment earlier this month) - Continue current treatment regimen. - Monitor blood counts regularly. - Ensure orders for future labs with CBC And CMET are in place.  Psoriatic arthritis Managed with bi-monthly infusions. Recent psoriasis flare-up improved post-infusion. - Continue with scheduled infusions every two months. - Consult rheumatologist for any adjustments needed.  Allergic rhinitis Contributes to sinus issues with minor bleeding, no active bleeding source found. - Use Breztri  inhaler as needed for allergy symptoms. - continue with sympomatic management of allergies.   RTC in 4 weeks for labs and f/u.

## 2023-11-16 ENCOUNTER — Other Ambulatory Visit: Payer: Self-pay

## 2023-11-16 ENCOUNTER — Ambulatory Visit

## 2023-11-16 ENCOUNTER — Ambulatory Visit (HOSPITAL_COMMUNITY)
Admission: RE | Admit: 2023-11-16 | Discharge: 2023-11-16 | Disposition: A | Discharge status: Home / Self Care | Source: Ambulatory Visit | Attending: Physician Assistant

## 2023-11-16 ENCOUNTER — Other Ambulatory Visit: Payer: Self-pay | Admitting: Allergy & Immunology

## 2023-11-16 ENCOUNTER — Ambulatory Visit (INDEPENDENT_AMBULATORY_CARE_PROVIDER_SITE_OTHER)

## 2023-11-16 ENCOUNTER — Encounter (HOSPITAL_COMMUNITY): Payer: Self-pay

## 2023-11-16 VITALS — BP 153/86 | HR 62 | Temp 98.1°F | Resp 20

## 2023-11-16 DIAGNOSIS — J209 Acute bronchitis, unspecified: Secondary | ICD-10-CM | POA: Diagnosis not present

## 2023-11-16 DIAGNOSIS — R051 Acute cough: Secondary | ICD-10-CM

## 2023-11-16 MED ORDER — AZITHROMYCIN 250 MG PO TABS: ORAL_TABLET | ORAL | 0 refills | Status: DC

## 2023-11-16 MED ORDER — BENZONATATE 100 MG PO CAPS: 100.0000 mg | ORAL_CAPSULE | Freq: Three times a day (TID) | ORAL | 0 refills | Status: DC

## 2023-11-16 NOTE — ED Triage Notes (Signed)
 Cough for 5-6 days.  Throat started hurting a couple days ago.  Reports phlegm is thick, mostly white, occasionally yellowish ( usually in the morning)  Is not taking any medications for symptoms

## 2023-11-16 NOTE — Discharge Instructions (Signed)
 Take antibiotic as prescribed Can take Tessalon as needed for cough Drink plenty of fluids Recommend Mucinex and Flonase  for congestion  Follow up with oncology

## 2023-11-16 NOTE — ED Provider Notes (Signed)
 [facility]    CSN: [id number] Arrival date & time: 11/16/23  1131      History   Chief Complaint Chief Complaint  Patient presents with   Sore Throat    Cancer patient - Entered by patient   Appointment    12:00    HPI [name] [name] is a 78 y.o. female.   Patient lanes of cough that started about a week ago.  She also reports sore throat that started several days ago.  She reports complains of thick yellowish phlegm that is worse in the mornings.  She is not taking any medications for symptoms at this time.  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  She denies fever, chills, body aches.  Patient reports she lives in a nursing facility.  She reports she tries to avoid the dining hall and other communal spaces.    Past Medical History:  Diagnosis Date   Asthma    Diverticulitis    Hypothyroid    MDS (myelodysplastic syndrome) (HCC) 01/2023   Psoriatic arthritis [facility])     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) 04/26/2023   Hypokalemia 04/26/2023   Neutropenic fever (HCC) 04/25/2023   Hives 03/18/2023   MDS (myelodysplastic syndrome), high grade (HCC) 01/29/2023   Obesity 03/02/2021   Other long term (current) drug therapy 03/02/2021   Primary osteoarthritis 03/02/2021   SOB (shortness of breath) 12/08/2020   Pericardial effusion 12/08/2020   Dry eyes/dry mouth 05/25/2019   Bronchitis, mucopurulent recurrent (HCC) 02/26/2019   Moderate persistent asthma without complication 02/24/2019   Perennial and seasonal allergic rhinitis 02/24/2019   Seasonal allergic conjunctivitis 02/24/2019   Primary osteoarthritis of both knees 04/10/2016   Lung nodule 08/19/2014   HTN (hypertension) 08/07/2012   HLD (hyperlipidemia) 09/26/2011   Asthma 09/06/2010   Depression 09/06/2010   History of allergy 09/06/2010   Hypothyroidism 09/06/2010   Lower back  pain 09/06/2010   Arthritis 09/06/2010   Pain in joint, pelvic region and thigh 09/06/2010   Peripheral neuropathy 09/06/2010   Psoriasis 09/06/2010   Therapeutic drug monitoring 09/06/2010   Tinnitus 09/06/2010   Vitamin B 12 deficiency 09/06/2010   Cervical spondylosis without myelopathy 04/24/2006   Tear of lateral cartilage or meniscus of knee, current 03/13/2006    Past Surgical History:  Procedure Laterality Date   COLONOSCOPY  08/2021   HIP SURGERY Bilateral    IR IMAGING GUIDED PORT INSERTION  02/20/2023   TONSILLECTOMY     TUBAL LIGATION      OB History   No obstetric history on file.      Home Medications    Prior to Admission medications   Medication Sig Start Date End Date Taking? Authorizing Provider  azithromycin (ZITHROMAX Z-PAK) 250 MG tablet Take 2 tablets by mouth on the first day and then 1 tablet daily for the remaining days. 11/16/23  Yes Ward, Char Common, PA-C  benzonatate (TESSALON) 100 MG capsule Take 1 capsule (100 mg total) by mouth every 8 (eight) hours. 11/16/23  Yes Ward, Char Common, PA-C  acetaminophen  (TYLENOL ) 325 MG tablet Take 325-650 mg by mouth every 6 (six) hours as needed for mild pain (pain score 1-3), headache or fever.    [provider]  albuterol  (VENTOLIN  HFA) 108 (90 Base) MCG/ACT inhaler Inhale 2 puffs into the lungs every 6 (six) hours as needed for wheezing or shortness of breath. 12/02/20  Desai, Nikita S, MD  Azelastine -Fluticasone  137-50 MCG/ACT SUSP Place 1 spray into the nose 2 (two) times daily as needed. 09/16/23   Rochester Chuck, MD  b complex vitamins tablet Take 1 tablet by mouth daily.    [provider]  Cholecalciferol (VITAMIN D3) 25 MCG (1000 UT) CAPS Take 1,000 Units by mouth daily. 04/24/11   [provider]  enasidenib mesylate  (IDHIFA ) 50 MG tablet Take 1 tablet (50 mg total) by mouth daily. 09/11/23   Iruku, Praveena, MD  EPINEPHrine  0.3 mg/0.3 mL IJ SOAJ injection Inject 0.3 mg into  the muscle as needed for anaphylaxis. Patient not taking: Reported on 07/31/2023 11/15/22   Rochester Chuck, MD  famotidine  (PEPCID ) 40 MG tablet Take 1 tablet (40 mg total) by mouth daily. 02/02/22   Graciella Lavender, PA  gabapentin  (NEURONTIN ) 800 MG tablet Take 800 mg by mouth in the morning and at bedtime.    [provider]  Golimumab (SIMPONI ARIA IV) Inject 206.4 mg into the vein See admin instructions. Inject 206.4 mg into the vein every 8 weeks    [provider]  hydrOXYzine  (VISTARIL ) 25 MG capsule Take 1 capsule (25 mg total) by mouth 2 (two) times daily as needed. Patient not taking: Reported on 07/31/2023 04/10/23   Iruku, Praveena, MD  ketoconazole  (NIZORAL ) 2 % cream Apply to bottoms of feet for 2-3 weeks 03/02/21   Orval Blanc, DPM  levothyroxine  (SYNTHROID ) 100 MCG tablet Take 100 mcg by mouth daily before breakfast. 02/20/21   [provider]  Magnesium 250 MG TABS Take 250 mg by mouth daily.    [provider]  montelukast  (SINGULAIR ) 10 MG tablet TAKE 1 TABLET(10 MG) BY MOUTH AT BEDTIME 05/28/23   Rochester Chuck, MD  ondansetron  (ZOFRAN ) 8 MG tablet Take 1 tablet (8 mg total) by mouth every 8 (eight) hours as needed for nausea or vomiting. 10/09/23   Iruku, Praveena, MD  oseltamivir  (TAMIFLU ) 75 MG capsule Use at 1 tab a day presently due to exposure - if becomes + for flu increase dose to 1 tab twice a day until completion of prescription 08/08/23   Iruku, Praveena, MD  pantoprazole  (PROTONIX ) 40 MG tablet TAKE 1 TABLET(40 MG) BY MOUTH TWICE DAILY BEFORE A MEAL Patient not taking: Reported on 07/31/2023 09/11/21   Lindle Rhea, MD  prochlorperazine  (COMPAZINE ) 5 MG tablet Take 1 tablet (5 mg total) by mouth every 6 (six) hours as needed for nausea or vomiting. 08/21/23   Iruku, Praveena, MD  triamcinolone  ointment (KENALOG ) 0.1 % Apply 1 Application topically 2 (two) times daily. Patient not taking: Reported on 07/31/2023  03/18/23   Ardie Kras, FNP  UNABLE TO FIND Med Name: CBD Gummy in morning    [provider]  UNABLE TO FIND Med Name: CBD gelcap, takes every evening    [provider]  UNABLE TO FIND Inhale 2 puffs into the lungs 2 (two) times daily as needed. Med Name: Breztri  inhaler    [provider]    Family History Family History  Problem Relation Age of Onset   Asthma Mother    Allergic rhinitis Mother    Rheum arthritis Mother    Allergic rhinitis Father    Alzheimer's disease Father    Eczema Brother    Allergic rhinitis Brother    Colon cancer Neg Hx    Esophageal cancer Neg Hx    Stomach cancer Neg Hx    Rectal cancer Neg  Hx     Social History Social History   Tobacco Use   Smoking status: Former    Current packs/day: 0.00    Average packs/day: 0.1 packs/day for 33.4 years (3.3 ttl pk-yrs)    Types: Cigarettes    Start date: 19    Quit date: 11/18/2000    Years since quitting: 23.0   Smokeless tobacco: Never   Tobacco comments:    3 cigarettes a day  Vaping Use   Vaping status: Never Used  Substance Use Topics   Alcohol use: Yes   Drug use: Never     Allergies   Bee pollen, Latex, Pollen extract-tree extract [pollen extract], Cat dander, Dust mite extract, and Molds & smuts   Review of Systems Review of Systems  Constitutional:  Negative for chills and fever.  HENT:  Positive for congestion and sore throat. Negative for ear pain.   Eyes:  Negative for pain and visual disturbance.  Respiratory:  Positive for cough. Negative for shortness of breath.   Cardiovascular:  Negative for chest pain and palpitations.  Gastrointestinal:  Negative for abdominal pain and vomiting.  Genitourinary:  Negative for dysuria and hematuria.  Musculoskeletal:  Negative for arthralgias and back pain.  Skin:  Negative for color change and rash.  Neurological:  Negative for seizures and syncope.  All other systems reviewed and are negative.    Physical  Exam Triage Vital Signs ED Triage Vitals  Encounter Vitals Group     BP 11/16/23 1236 (!) 153/86     Systolic BP Percentile --      Diastolic BP Percentile --      Pulse Rate 11/16/23 1236 62     Resp 11/16/23 1236 20     Temp 11/16/23 1236 98.1 F (36.7 C)     Temp Source 11/16/23 1236 Oral     SpO2 11/16/23 1236 97 %     Weight --      Height --      Head Circumference --      Peak Flow --      Pain Score 11/16/23 1233 3     Pain Loc --      Pain Education --      Exclude from Growth Chart --    No data found.  Updated Vital Signs BP (!) 153/86 (BP Location: Right Arm)   Pulse 62   Temp 98.1 F (36.7 C) (Oral)   Resp 20   SpO2 97%   Visual Acuity Right Eye Distance:   Left Eye Distance:   Bilateral Distance:    Right Eye Near:   Left Eye Near:    Bilateral Near:     Physical Exam Vitals and nursing note reviewed.  Constitutional:      General: She is not in acute distress.    Appearance: She is well-developed.  HENT:     Head: Normocephalic and atraumatic.  Eyes:     Conjunctiva/sclera: Conjunctivae normal.  Cardiovascular:     Rate and Rhythm: Normal rate and regular rhythm.     Heart sounds: No murmur heard. Pulmonary:     Effort: Pulmonary effort is normal. No respiratory distress.     Breath sounds: Normal breath sounds.  Abdominal:     Palpations: Abdomen is soft.     Tenderness: There is no abdominal tenderness.  Musculoskeletal:        General: No swelling.     Cervical back: Neck supple.  Skin:    General: Skin  is warm and dry.     Capillary Refill: Capillary refill takes less than 2 seconds.  Neurological:     Mental Status: She is alert.  Psychiatric:        Mood and Affect: Mood normal.      UC Treatments / Results  Labs (all labs ordered are listed, but only abnormal results are displayed) Labs Reviewed - No data to display  EKG   Radiology DG Chest 2 View Result Date: 11/16/2023 CLINICAL DATA:  cough, wheezing EXAM:  CHEST - 2 VIEW COMPARISON:  None available. FINDINGS: Right chest port is similar in position terminating in the lower SVC. Left apical pleural thickening. No focal airspace consolidation, pleural effusion, or pneumothorax. Mild cardiomegaly. Tortuous aorta with aortic atherosclerosis. No acute fracture or destructive lesions. Multilevel thoracic osteophytosis. IMPRESSION: No acute cardiopulmonary abnormality. Electronically Signed   By: Rance Burrows M.D.   On: 11/16/2023 13:45    Procedures Procedures (including critical care time)  Medications Ordered in UC Medications - No data to display  Initial Impression / Assessment and Plan / UC Course  I have reviewed the triage vital signs and the nursing notes.  Pertinent labs & imaging results that were available during my care of the patient were reviewed by me and considered in my medical decision making (see chart for details).     Acute bronchitis.  Will cover with azithromycin.  Care discussed.  Tessalon prescribed to take as needed for cough.  Advise follow-up with oncology. Final Clinical Impressions(s) / UC Diagnoses   Final diagnoses:  Acute cough  Acute bronchitis, unspecified organism     Discharge Instructions      Take antibiotic as prescribed Can take Tessalon as needed for cough Drink plenty of fluids Recommend Mucinex and Flonase  for congestion  Follow up with oncology   ED Prescriptions     Medication Sig Dispense Auth. Provider   azithromycin (ZITHROMAX Z-PAK) 250 MG tablet Take 2 tablets by mouth on the first day and then 1 tablet daily for the remaining days. 6 each Ward, Linday Rhodes Z, PA-C   benzonatate (TESSALON) 100 MG capsule Take 1 capsule (100 mg total) by mouth every 8 (eight) hours. 21 capsule Ward, Anhar Mcdermott Z, PA-C      PDMP not reviewed this encounter.   Ward, Char Common, PA-C 11/16/23 [phone]

## 2023-11-17 ENCOUNTER — Ambulatory Visit (HOSPITAL_COMMUNITY)

## 2023-11-19 ENCOUNTER — Encounter: Payer: Self-pay | Admitting: Internal Medicine

## 2023-11-22 ENCOUNTER — Encounter: Payer: Self-pay | Admitting: Hematology and Oncology

## 2023-11-26 ENCOUNTER — Other Ambulatory Visit (HOSPITAL_COMMUNITY): Payer: Self-pay

## 2023-11-27 ENCOUNTER — Encounter: Payer: Self-pay | Admitting: Hematology and Oncology

## 2023-11-29 ENCOUNTER — Other Ambulatory Visit: Payer: Self-pay

## 2023-12-01 ENCOUNTER — Encounter (INDEPENDENT_AMBULATORY_CARE_PROVIDER_SITE_OTHER): Payer: Self-pay

## 2023-12-02 ENCOUNTER — Other Ambulatory Visit: Payer: Self-pay

## 2023-12-02 ENCOUNTER — Other Ambulatory Visit (HOSPITAL_COMMUNITY): Payer: Self-pay

## 2023-12-02 NOTE — Progress Notes (Signed)
 Specialty Pharmacy Refill Coordination Note  MyChart Questionnaire Submission  [name] [name] is a 78 y.o. female contacted today regarding refills of specialty medication(s) Idhifa .  Patient requested (Patient-Rptd) Delivery   Delivery date: 12/04/23   Verified address: (Patient-Rptd) [address], [place] [address]  Medication will be filled on 12/03/23.

## 2023-12-03 ENCOUNTER — Other Ambulatory Visit: Payer: Self-pay

## 2023-12-10 ENCOUNTER — Inpatient Hospital Stay (HOSPITAL_BASED_OUTPATIENT_CLINIC_OR_DEPARTMENT_OTHER): Admitting: Hematology and Oncology

## 2023-12-10 ENCOUNTER — Inpatient Hospital Stay: Attending: Hematology and Oncology

## 2023-12-10 ENCOUNTER — Encounter: Payer: Self-pay | Admitting: Hematology and Oncology

## 2023-12-10 VITALS — BP 131/67 | HR 78 | Temp 98.2°F | Resp 18 | Wt 204.2 lb

## 2023-12-10 DIAGNOSIS — L4050 Arthropathic psoriasis, unspecified: Secondary | ICD-10-CM | POA: Diagnosis not present

## 2023-12-10 DIAGNOSIS — J4540 Moderate persistent asthma, uncomplicated: Secondary | ICD-10-CM | POA: Insufficient documentation

## 2023-12-10 DIAGNOSIS — K529 Noninfective gastroenteritis and colitis, unspecified: Secondary | ICD-10-CM | POA: Diagnosis not present

## 2023-12-10 DIAGNOSIS — Z818 Family history of other mental and behavioral disorders: Secondary | ICD-10-CM | POA: Insufficient documentation

## 2023-12-10 DIAGNOSIS — R635 Abnormal weight gain: Secondary | ICD-10-CM | POA: Insufficient documentation

## 2023-12-10 DIAGNOSIS — R5383 Other fatigue: Secondary | ICD-10-CM | POA: Diagnosis not present

## 2023-12-10 DIAGNOSIS — D469 Myelodysplastic syndrome, unspecified: Secondary | ICD-10-CM | POA: Diagnosis present

## 2023-12-10 DIAGNOSIS — Z9089 Acquired absence of other organs: Secondary | ICD-10-CM | POA: Diagnosis not present

## 2023-12-10 DIAGNOSIS — D46Z Other myelodysplastic syndromes: Secondary | ICD-10-CM

## 2023-12-10 DIAGNOSIS — Z87891 Personal history of nicotine dependence: Secondary | ICD-10-CM | POA: Diagnosis not present

## 2023-12-10 DIAGNOSIS — T451X5A Adverse effect of antineoplastic and immunosuppressive drugs, initial encounter: Secondary | ICD-10-CM | POA: Insufficient documentation

## 2023-12-10 DIAGNOSIS — Z840 Family history of diseases of the skin and subcutaneous tissue: Secondary | ICD-10-CM | POA: Diagnosis not present

## 2023-12-10 DIAGNOSIS — D696 Thrombocytopenia, unspecified: Secondary | ICD-10-CM | POA: Diagnosis present

## 2023-12-10 DIAGNOSIS — D61810 Antineoplastic chemotherapy induced pancytopenia: Secondary | ICD-10-CM | POA: Insufficient documentation

## 2023-12-10 DIAGNOSIS — Z79899 Other long term (current) drug therapy: Secondary | ICD-10-CM | POA: Insufficient documentation

## 2023-12-10 DIAGNOSIS — E876 Hypokalemia: Secondary | ICD-10-CM | POA: Insufficient documentation

## 2023-12-10 DIAGNOSIS — Z8261 Family history of arthritis: Secondary | ICD-10-CM | POA: Insufficient documentation

## 2023-12-10 LAB — CBC WITH DIFFERENTIAL (CANCER CENTER ONLY)
Abs Immature Granulocytes: 0.05 10*3/uL (ref 0.00–0.07)
Basophils Absolute: 0 10*3/uL (ref 0.0–0.1)
Basophils Relative: 1 %
Eosinophils Absolute: 0.1 10*3/uL (ref 0.0–0.5)
Eosinophils Relative: 1 %
HCT: 35.6 % — ABNORMAL LOW (ref 36.0–46.0)
Hemoglobin: 12.1 g/dL (ref 12.0–15.0)
Immature Granulocytes: 1 %
Lymphocytes Relative: 30 %
Lymphs Abs: 1.5 10*3/uL (ref 0.7–4.0)
MCH: 35.3 pg — ABNORMAL HIGH (ref 26.0–34.0)
MCHC: 34 g/dL (ref 30.0–36.0)
MCV: 103.8 fL — ABNORMAL HIGH (ref 80.0–100.0)
Monocytes Absolute: 0.5 10*3/uL (ref 0.1–1.0)
Monocytes Relative: 10 %
Neutro Abs: 2.9 10*3/uL (ref 1.7–7.7)
Neutrophils Relative %: 57 %
Platelet Count: 192 10*3/uL (ref 150–400)
RBC: 3.43 MIL/uL — ABNORMAL LOW (ref 3.87–5.11)
RDW: 17.5 % — ABNORMAL HIGH (ref 11.5–15.5)
WBC Count: 5.1 10*3/uL (ref 4.0–10.5)
nRBC: 0 % (ref 0.0–0.2)

## 2023-12-10 LAB — CMP (CANCER CENTER ONLY)
ALT: 8 U/L (ref 0–44)
AST: 11 U/L — ABNORMAL LOW (ref 15–41)
Albumin: 3.8 g/dL (ref 3.5–5.0)
Alkaline Phosphatase: 47 U/L (ref 38–126)
Anion gap: 5 (ref 5–15)
BUN: 10 mg/dL (ref 8–23)
CO2: 27 mmol/L (ref 22–32)
Calcium: 8.8 mg/dL — ABNORMAL LOW (ref 8.9–10.3)
Chloride: 105 mmol/L (ref 98–111)
Creatinine: 0.43 mg/dL — ABNORMAL LOW (ref 0.44–1.00)
GFR, Estimated: >60 mL/min (ref >60)
Glucose, Bld: 95 mg/dL (ref 70–99)
Potassium: 4 mmol/L (ref 3.5–5.1)
Sodium: 137 mmol/L (ref 135–145)
Total Bilirubin: 0.8 mg/dL (ref 0.0–1.2)
Total Protein: 6.7 g/dL (ref 6.5–8.1)

## 2023-12-10 MED ORDER — SODIUM CHLORIDE 0.9% FLUSH
10.0000 mL | Freq: Once | INTRAVENOUS | Status: AC
  Administered 2023-12-10: 10 mL

## 2023-12-10 MED ORDER — HEPARIN SOD (PORK) LOCK FLUSH 100 UNIT/ML IV SOLN
500.0000 [IU] | Freq: Once | INTRAVENOUS | Status: AC
  Administered 2023-12-10: 500 [IU]

## 2023-12-10 NOTE — Progress Notes (Signed)
 [facility] Cancer Follow up:    [name], [name], MD [address] [place] [address]   DIAGNOSIS: Myelodysplastic Syndrome  SUMMARY OF ONCOLOGIC HISTORY: Oncology History  MDS (myelodysplastic syndrome), high grade (HCC)  01/07/2023 Initial Biopsy   Bone marrow biopsy:cellular bone marrow with focal areas of hypercellularity (approximately up to  50%).  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  Blasts appear mildly mildly increased, approximately 5% to focally up to 10%.  Flow cytometric analysis reveals 5% CD34 positive blasts.  While the findings could be attributable to the patient's coexisting conditions, the possibility of a myeloid neoplasm such as myelodysplastic neoplasm with increased blasts cannot be entirely excluded. normal karyotype   01/29/2023 Initial Diagnosis   MDS (myelodysplastic syndrome), high grade (HCC)   02/04/2023 - 02/06/2023 Chemotherapy   Patient is on Treatment Plan : MYELODYSPLASIA  Azacitidine  IV D1-5 q28d     03/04/2023 - 07/29/2023 Chemotherapy   Patient is on Treatment Plan : MYELODYSPLASIA Decitabine  D1-5 q28d       CURRENT THERAPY: Azacitidine   [name] [name] 78 y.o. female returns for follow-up while on enasidenib.  Discussed the use of AI scribe software for clinical note transcription with the patient, who gave verbal consent to proceed.  History of Present Illness [name] [name] is a 78 year old female with psoriasis and psoriatic arthritis who presents for MDS follow up while on IDHIFA  50 mg PO daily.   [name] [name] is a 78 year old female with MDS and psoriatic arthritis who presents with diarrhea and worsening psoriasis.  She experiences diarrhea approximately five times a day without Imodium, which decreases in frequency with its use. Imodium is used almost daily, especially when she has planned activities. She also experiences nausea and fatigue, which initially improved with a dose reduction  of her medication but have since returned. She is currently taking Zofran , Protonix , and Compazine  for nausea, trying to determine which is most effective.  Her psoriasis flares up about a week or two after her infusion treatment for psoriatic arthritis, which she receives every two months. Initially, the infusion cleared her skin, but the effectiveness diminishes before the next dose. She has gained approximately ten pounds since February, attributed to dietary changes aimed at settling her stomach, such as eating more bread. She uses baby wipes to manage skin irritation around her genital area, which she describes as a burning sensation.  She participates in physical therapy, which she finds beneficial for her balance and leg strength. She mentions that she tolerated IV treatments better than her current oral medication, which causes more side effects.She mentions occasional heart fluttering and skipping beats.  Rest of the pertinent 10 point ROS reviewed and neg.  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) 04/26/2023   Hypokalemia 04/26/2023   Neutropenic fever (HCC) 04/25/2023   Hives 03/18/2023   MDS (myelodysplastic syndrome), high grade (HCC) 01/29/2023   Obesity 03/02/2021   Other long term (current) drug therapy 03/02/2021   Primary osteoarthritis 03/02/2021   SOB (shortness of breath) 12/08/2020   Pericardial effusion 12/08/2020   Dry eyes/dry mouth 05/25/2019   Bronchitis, mucopurulent recurrent (HCC) 02/26/2019   Moderate persistent asthma without complication 02/24/2019   Perennial and seasonal allergic rhinitis 02/24/2019   Seasonal allergic conjunctivitis 02/24/2019   Primary osteoarthritis of both knees 04/10/2016   Lung nodule 08/19/2014   HTN (hypertension) 08/07/2012  HLD (hyperlipidemia) 09/26/2011   Asthma 09/06/2010   Depression 09/06/2010   History of allergy 09/06/2010   Hypothyroidism  09/06/2010   Lower back pain 09/06/2010   Arthritis 09/06/2010   Pain in joint, pelvic region and thigh 09/06/2010   Peripheral neuropathy 09/06/2010   Psoriasis 09/06/2010   Therapeutic drug monitoring 09/06/2010   Tinnitus 09/06/2010   Vitamin B 12 deficiency 09/06/2010   Cervical spondylosis without myelopathy 04/24/2006   Tear of lateral cartilage or meniscus of knee, current 03/13/2006    is allergic to bee pollen, latex, pollen extract-tree extract [pollen extract], cat dander, dust mite extract, and molds & smuts.  MEDICAL HISTORY: Past Medical History:  Diagnosis Date   Asthma    Diverticulitis    Hypothyroid    MDS (myelodysplastic syndrome) (HCC) 01/2023   Psoriatic arthritis (HCC)     SURGICAL HISTORY: Past Surgical History:  Procedure Laterality Date   COLONOSCOPY  08/2021   HIP SURGERY Bilateral    IR IMAGING GUIDED PORT INSERTION  02/20/2023   TONSILLECTOMY     TUBAL LIGATION      SOCIAL HISTORY: Social History   Socioeconomic History   Marital status: Widowed    Spouse name: Not on file   Number of children: Not on file   Years of education: Not on file   Highest education level: Not on file  Occupational History   Not on file  Tobacco Use   Smoking status: Former    Current packs/day: 0.00    Average packs/day: 0.1 packs/day for 33.4 years (3.3 ttl pk-yrs)    Types: Cigarettes    Start date: 20    Quit date: 11/18/2000    Years since quitting: 23.0   Smokeless tobacco: Never   Tobacco comments:    3 cigarettes a day  Vaping Use   Vaping status: Never Used  Substance and Sexual Activity   Alcohol use: Yes   Drug use: Never   Sexual activity: Not Currently  Other Topics Concern   Not on file  Social History Narrative   Lives with children   Social Drivers of Health   Financial Resource Strain: Not on file  Food Insecurity: No Food Insecurity (04/26/2023)   Hunger Vital Sign    Worried About Running Out of Food in the Last Year: Never  true    Ran Out of Food in the Last Year: Never true  Transportation Needs: No Transportation Needs (04/26/2023)   PRAPARE - Administrator, Civil Service (Medical): No    Lack of Transportation (Non-Medical): No  Physical Activity: Not on file  Stress: Not on file  Social Connections: Not on file  Intimate Partner Violence: Not At Risk (04/26/2023)   Humiliation, Afraid, Rape, and Kick questionnaire    Fear of Current or Ex-Partner: No    Emotionally Abused: No    Physically Abused: No    Sexually Abused: No    FAMILY HISTORY: Family History  Problem Relation Age of Onset   Asthma Mother    Allergic rhinitis Mother    Rheum arthritis Mother    Allergic rhinitis Father    Alzheimer's disease Father    Eczema Brother    Allergic rhinitis Brother    Colon cancer Neg Hx    Esophageal cancer Neg Hx    Stomach cancer Neg Hx    Rectal cancer Neg Hx     PHYSICAL EXAMINATION     Vitals:   12/10/23 1039  BP: 131/67  Pulse: 78  Resp: 18  Temp: 98.2 F (36.8 C)  SpO2: 97%   Physical Exam Constitutional:      Appearance: Normal appearance.   Cardiovascular:     Rate and Rhythm: Normal rate and regular rhythm.  Pulmonary:     Effort: Pulmonary effort is normal.     Breath sounds: Normal breath sounds.  Abdominal:     General: Abdomen is flat.     Palpations: Abdomen is soft.   Musculoskeletal:        General: Normal range of motion.     Cervical back: Normal range of motion and neck supple. No rigidity.  Lymphadenopathy:     Cervical: No cervical adenopathy.   Skin:    General: Skin is warm and dry.   Neurological:     Mental Status: She is alert.   Psychiatric:        Mood and Affect: Mood normal.      LABORATORY DATA:  CBC    Component Value Date/Time   WBC 5.1 12/10/2023 1020   WBC 3.4 (L) 11/12/2023 0956   RBC 3.43 (L) 12/10/2023 1020   HGB 12.1 12/10/2023 1020   HCT 35.6 (L) 12/10/2023 1020   HCT 39.0 11/24/2022 1035   PLT 192  12/10/2023 1020   MCV 103.8 (H) 12/10/2023 1020   MCH 35.3 (H) 12/10/2023 1020   MCHC 34.0 12/10/2023 1020   RDW 17.5 (H) 12/10/2023 1020   LYMPHSABS 1.5 12/10/2023 1020   MONOABS 0.5 12/10/2023 1020   EOSABS 0.1 12/10/2023 1020   BASOSABS 0.0 12/10/2023 1020    CMP     Component Value Date/Time   NA 137 12/10/2023 1020   K 4.0 12/10/2023 1020   CL 105 12/10/2023 1020   CO2 27 12/10/2023 1020   GLUCOSE 95 12/10/2023 1020   BUN 10 12/10/2023 1020   CREATININE 0.43 (L) 12/10/2023 1020   CALCIUM 8.8 (L) 12/10/2023 1020   PROT 6.7 12/10/2023 1020   ALBUMIN 3.8 12/10/2023 1020   AST 11 (L) 12/10/2023 1020   ALT 8 12/10/2023 1020   ALKPHOS 47 12/10/2023 1020   BILITOT 0.8 12/10/2023 1020   GFRNONAA >60 12/10/2023 1020    ASSESSMENT and THERAPY PLAN:   MDS (myelodysplastic syndrome), high grade (HCC)  Assessment & Plan  Assessment & Plan Myelodysplastic syndromes (MDS) on IDHIFA  50 mg po daily MDS is stable with satisfactory blood counts (reviewed today) She is not tolerating this well She complains of severe nausea, diarrhea We discussed that although this is not SOC, if she wants to try taking every other day and RTC in 4 weeks for follow up with repeat labs, this will work well.  Psoriatic arthritis Current bi-monthly infusions may be insufficient as psoriasis flares before next dose. Consider dose adjustment or alternative treatments. - Discuss with rheumatologist about potential dose adjustment or alternative treatments at next appointment.  Diarrhea, Nausea, and Fatigue Chronic diarrhea improved with Imodium, likely medication side effect. Nausea and fatigue possibly medication-related. Symptoms initially improved with dose reduction but returned. - Continue Imodium as needed for diarrhea control. - Consider alternating Zofran  and Compazine  for nausea management. - Try taking medication every other day to assess symptom improvement while maintaining disease  control.  Weight gain Weight gain of ten pounds since February, possibly due to increased carbohydrate intake for stomach upset management. - Monitor weight and dietary intake. - Discuss dietary habits and potential adjustments to manage weight.  All questions were answered. The patient knows to call the clinic with any problems, questions or concerns. We can certainly see the patient much sooner if necessary.  Total encounter time:30 minutes*in face-to-face visit time, chart review, lab review, care coordination, order entry, and documentation of the encounter time.    *Total Encounter Time as defined by the Centers for Medicare and Medicaid Services includes, in addition to the face-to-face time of a patient visit (documented in the note above) non-face-to-face time: obtaining and reviewing outside history, ordering and reviewing medications, tests or procedures, care coordination (communications with other health care professionals or caregivers) and documentation in the medical record.

## 2023-12-10 NOTE — Assessment & Plan Note (Addendum)
  Assessment & Plan  Assessment & Plan Myelodysplastic syndromes (MDS) on IDHIFA  50 mg po daily MDS is stable with satisfactory blood counts (reviewed today) She is not tolerating this well She complains of severe nausea, diarrhea We discussed that although this is not SOC, if she wants to try taking every other day and RTC in 4 weeks for follow up with repeat labs, this will work well.  Psoriatic arthritis Current bi-monthly infusions may be insufficient as psoriasis flares before next dose. Consider dose adjustment or alternative treatments. - Discuss with rheumatologist about potential dose adjustment or alternative treatments at next appointment.  Diarrhea, Nausea, and Fatigue Chronic diarrhea improved with Imodium, likely medication side effect. Nausea and fatigue possibly medication-related. Symptoms initially improved with dose reduction but returned. - Continue Imodium as needed for diarrhea control. - Consider alternating Zofran  and Compazine  for nausea management. - Try taking medication every other day to assess symptom improvement while maintaining disease control.  Weight gain Weight gain of ten pounds since February, possibly due to increased carbohydrate intake for stomach upset management. - Monitor weight and dietary intake. - Discuss dietary habits and potential adjustments to manage weight.

## 2023-12-17 ENCOUNTER — Other Ambulatory Visit: Payer: Self-pay | Admitting: Allergy & Immunology

## 2023-12-17 ENCOUNTER — Encounter: Payer: Self-pay | Admitting: Allergy & Immunology

## 2023-12-17 MED ORDER — MONTELUKAST SODIUM 10 MG PO TABS: ORAL_TABLET | ORAL | 0 refills | Status: DC

## 2024-01-01 ENCOUNTER — Other Ambulatory Visit (HOSPITAL_COMMUNITY): Payer: Self-pay

## 2024-01-03 ENCOUNTER — Other Ambulatory Visit (HOSPITAL_COMMUNITY): Payer: Self-pay

## 2024-01-03 ENCOUNTER — Other Ambulatory Visit: Payer: Self-pay | Admitting: Hematology and Oncology

## 2024-01-05 MED ORDER — ENASIDENIB MESYLATE 50 MG PO TABS
50.0000 mg | ORAL_TABLET | Freq: Every day | ORAL | 3 refills | Status: DC
  Filled 2024-01-06 – 2024-03-09 (×4): qty 30, 30d supply, fill #0

## 2024-01-06 ENCOUNTER — Other Ambulatory Visit: Payer: Self-pay

## 2024-01-06 ENCOUNTER — Telehealth: Payer: Self-pay

## 2024-01-06 NOTE — Telephone Encounter (Signed)
 Patient comfirmed

## 2024-01-07 ENCOUNTER — Inpatient Hospital Stay (HOSPITAL_BASED_OUTPATIENT_CLINIC_OR_DEPARTMENT_OTHER): Admitting: Hematology and Oncology

## 2024-01-07 ENCOUNTER — Other Ambulatory Visit: Payer: Self-pay

## 2024-01-07 ENCOUNTER — Inpatient Hospital Stay: Attending: Hematology and Oncology

## 2024-01-07 VITALS — BP 137/70 | HR 67 | Temp 98.0°F | Resp 17 | Wt 204.8 lb

## 2024-01-07 DIAGNOSIS — L4050 Arthropathic psoriasis, unspecified: Secondary | ICD-10-CM | POA: Insufficient documentation

## 2024-01-07 DIAGNOSIS — R197 Diarrhea, unspecified: Secondary | ICD-10-CM | POA: Insufficient documentation

## 2024-01-07 DIAGNOSIS — Z9089 Acquired absence of other organs: Secondary | ICD-10-CM | POA: Diagnosis not present

## 2024-01-07 DIAGNOSIS — Z87891 Personal history of nicotine dependence: Secondary | ICD-10-CM | POA: Insufficient documentation

## 2024-01-07 DIAGNOSIS — Z8261 Family history of arthritis: Secondary | ICD-10-CM | POA: Diagnosis not present

## 2024-01-07 DIAGNOSIS — T451X5A Adverse effect of antineoplastic and immunosuppressive drugs, initial encounter: Secondary | ICD-10-CM | POA: Insufficient documentation

## 2024-01-07 DIAGNOSIS — Z9851 Tubal ligation status: Secondary | ICD-10-CM | POA: Insufficient documentation

## 2024-01-07 DIAGNOSIS — D46Z Other myelodysplastic syndromes: Secondary | ICD-10-CM

## 2024-01-07 DIAGNOSIS — R1310 Dysphagia, unspecified: Secondary | ICD-10-CM | POA: Diagnosis not present

## 2024-01-07 DIAGNOSIS — R110 Nausea: Secondary | ICD-10-CM | POA: Diagnosis not present

## 2024-01-07 DIAGNOSIS — Z79899 Other long term (current) drug therapy: Secondary | ICD-10-CM | POA: Insufficient documentation

## 2024-01-07 DIAGNOSIS — J4540 Moderate persistent asthma, uncomplicated: Secondary | ICD-10-CM | POA: Diagnosis not present

## 2024-01-07 DIAGNOSIS — Z818 Family history of other mental and behavioral disorders: Secondary | ICD-10-CM | POA: Insufficient documentation

## 2024-01-07 DIAGNOSIS — D469 Myelodysplastic syndrome, unspecified: Secondary | ICD-10-CM | POA: Diagnosis present

## 2024-01-07 DIAGNOSIS — F419 Anxiety disorder, unspecified: Secondary | ICD-10-CM | POA: Diagnosis not present

## 2024-01-07 DIAGNOSIS — E876 Hypokalemia: Secondary | ICD-10-CM | POA: Insufficient documentation

## 2024-01-07 DIAGNOSIS — D61810 Antineoplastic chemotherapy induced pancytopenia: Secondary | ICD-10-CM | POA: Diagnosis not present

## 2024-01-07 DIAGNOSIS — Z91030 Bee allergy status: Secondary | ICD-10-CM | POA: Diagnosis not present

## 2024-01-07 DIAGNOSIS — Z825 Family history of asthma and other chronic lower respiratory diseases: Secondary | ICD-10-CM | POA: Diagnosis not present

## 2024-01-07 DIAGNOSIS — E039 Hypothyroidism, unspecified: Secondary | ICD-10-CM | POA: Diagnosis not present

## 2024-01-07 LAB — CMP (CANCER CENTER ONLY)
ALT: 10 U/L (ref 0–44)
AST: 14 U/L — ABNORMAL LOW (ref 15–41)
Albumin: 4 g/dL (ref 3.5–5.0)
Alkaline Phosphatase: 46 U/L (ref 38–126)
Anion gap: 7 (ref 5–15)
BUN: 11 mg/dL (ref 8–23)
CO2: 28 mmol/L (ref 22–32)
Calcium: 9.2 mg/dL (ref 8.9–10.3)
Chloride: 105 mmol/L (ref 98–111)
Creatinine: 0.43 mg/dL — ABNORMAL LOW (ref 0.44–1.00)
GFR, Estimated: >60 mL/min (ref >60)
Glucose, Bld: 99 mg/dL (ref 70–99)
Potassium: 4.2 mmol/L (ref 3.5–5.1)
Sodium: 140 mmol/L (ref 135–145)
Total Bilirubin: 0.6 mg/dL (ref 0.0–1.2)
Total Protein: 6.9 g/dL (ref 6.5–8.1)

## 2024-01-07 LAB — CBC WITH DIFFERENTIAL (CANCER CENTER ONLY)
Abs Immature Granulocytes: 0.01 K/uL (ref 0.00–0.07)
Basophils Absolute: 0.1 K/uL (ref 0.0–0.1)
Basophils Relative: 1 %
Eosinophils Absolute: 0.1 K/uL (ref 0.0–0.5)
Eosinophils Relative: 2 %
HCT: 36.7 % (ref 36.0–46.0)
Hemoglobin: 12.4 g/dL (ref 12.0–15.0)
Immature Granulocytes: 0 %
Lymphocytes Relative: 44 %
Lymphs Abs: 1.7 K/uL (ref 0.7–4.0)
MCH: 36 pg — ABNORMAL HIGH (ref 26.0–34.0)
MCHC: 33.8 g/dL (ref 30.0–36.0)
MCV: 106.7 fL — ABNORMAL HIGH (ref 80.0–100.0)
Monocytes Absolute: 0.4 K/uL (ref 0.1–1.0)
Monocytes Relative: 10 %
Neutro Abs: 1.7 K/uL (ref 1.7–7.7)
Neutrophils Relative %: 43 %
Platelet Count: 168 K/uL (ref 150–400)
RBC: 3.44 MIL/uL — ABNORMAL LOW (ref 3.87–5.11)
RDW: 18.3 % — ABNORMAL HIGH (ref 11.5–15.5)
WBC Count: 3.9 K/uL — ABNORMAL LOW (ref 4.0–10.5)
nRBC: 0 % (ref 0.0–0.2)

## 2024-01-07 MED ORDER — SODIUM CHLORIDE 0.9% FLUSH
10.0000 mL | Freq: Once | INTRAVENOUS | Status: AC
Start: 2024-01-07 — End: 2024-01-07
  Administered 2024-01-07: 10 mL

## 2024-01-07 MED ORDER — ONDANSETRON HCL 8 MG PO TABS: 8.0000 mg | ORAL_TABLET | Freq: Three times a day (TID) | ORAL | 1 refills | Status: DC | PRN

## 2024-01-07 MED ORDER — HEPARIN SOD (PORK) LOCK FLUSH 100 UNIT/ML IV SOLN
250.0000 [IU] | Freq: Once | INTRAVENOUS | Status: AC
Start: 2024-01-07 — End: 2024-01-07
  Administered 2024-01-07: 250 [IU]

## 2024-01-07 NOTE — Assessment & Plan Note (Addendum)
 Assessment and Plan Assessment & Plan  Myelodysplastic syndromes (MDS) on IDHIFA  50 mg po every other day. MDS is stable with satisfactory blood counts (reviewed today) She is not tolerating this well  Diarrhea due to Idhifa  Diarrhea occurs primarily the day after Idhifa , with urgency but not exceeding five times a day. Less severe but causes anxiety about bathroom proximity. - Advise taking Imodium preemptively on non-Idhifa  days, especially if going out. - Recommend starting with 2 mg Imodium in the morning before symptoms.  Nausea due to Idhifa  Nausea persists with Idhifa , managed with ondansetron . - Refill ondansetron  prescription, send to [facility] on [address].

## 2024-01-07 NOTE — Progress Notes (Signed)
 [facility] Cancer Follow up:    [name], [name], MD [address] [place] [address]   DIAGNOSIS: Myelodysplastic Syndrome  SUMMARY OF ONCOLOGIC HISTORY: Oncology History  MDS (myelodysplastic syndrome), high grade (HCC)  01/07/2023 Initial Biopsy   Bone marrow biopsy:cellular bone marrow with focal areas of hypercellularity (approximately up to  50%).  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  Blasts appear mildly mildly increased, approximately 5% to focally up to 10%.  Flow cytometric analysis reveals 5% CD34 positive blasts.  While the findings could be attributable to the patient's coexisting conditions, the possibility of a myeloid neoplasm such as myelodysplastic neoplasm with increased blasts cannot be entirely excluded. normal karyotype   01/29/2023 Initial Diagnosis   MDS (myelodysplastic syndrome), high grade (HCC)   02/04/2023 - 02/06/2023 Chemotherapy   Patient is on Treatment Plan : MYELODYSPLASIA  Azacitidine  IV D1-5 q28d     03/04/2023 - 07/29/2023 Chemotherapy   Patient is on Treatment Plan : MYELODYSPLASIA Decitabine  D1-5 q28d       CURRENT THERAPY: Azacitidine   History of Present Illness [name] [name] is a 78 year old female with psoriasis and psoriatic arthritis who presents for MDS follow up while on IDHIFA  50 mg PO every other day.  Discussed the use of AI scribe software for clinical note transcription with the patient, who gave verbal consent to proceed.  History of Present Illness  She is experiencing side effects from Idhifa , which she takes every other day. She has persistent nausea and diarrhea, with diarrhea being more pronounced the day after taking the medication. The diarrhea is urgent, requiring her to be near a bathroom, but occurs less than five times a day. She manages the diarrhea with Imodium as needed, especially when she has plans to go out.  She recently experienced a yeast infection and took fluconazole  for  treatment. Due to her gynecologist's retirement, she consulted with her internist's nurse practitioner for management.  She is currently taking a probiotic, although she is unsure of the dosage, and uses ondansetron  every morning to manage nausea.  She has been participating in physical therapy to improve her balance, which she finds helpful and feels stronger as a result.  Rest of the pertinent 10 point ROS reviewed and neg.  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) 04/26/2023   Hypokalemia 04/26/2023   Neutropenic fever (HCC) 04/25/2023   Hives 03/18/2023   MDS (myelodysplastic syndrome), high grade (HCC) 01/29/2023   Obesity 03/02/2021   Other long term (current) drug therapy 03/02/2021   Primary osteoarthritis 03/02/2021   SOB (shortness of breath) 12/08/2020   Pericardial effusion 12/08/2020   Dry eyes/dry mouth 05/25/2019   Bronchitis, mucopurulent recurrent (HCC) 02/26/2019   Moderate persistent asthma without complication 02/24/2019   Perennial and seasonal allergic rhinitis 02/24/2019   Seasonal allergic conjunctivitis 02/24/2019   Primary osteoarthritis of both knees 04/10/2016   Lung nodule 08/19/2014   HTN (hypertension) 08/07/2012   HLD (hyperlipidemia) 09/26/2011   Asthma 09/06/2010   Depression 09/06/2010   History of allergy 09/06/2010   Hypothyroidism 09/06/2010   Lower back pain 09/06/2010   Arthritis 09/06/2010   Pain in joint, pelvic region and thigh 09/06/2010   Peripheral neuropathy 09/06/2010   Psoriasis 09/06/2010   Therapeutic drug monitoring 09/06/2010   Tinnitus 09/06/2010   Vitamin B 12 deficiency 09/06/2010   Cervical spondylosis without myelopathy 04/24/2006   Tear of  lateral cartilage or meniscus of knee, current 03/13/2006    is allergic to bee pollen, latex, pollen extract-tree extract [pollen extract], cat dander, dust mite extract, and molds & smuts.  MEDICAL  HISTORY: Past Medical History:  Diagnosis Date   Asthma    Diverticulitis    Hypothyroid    MDS (myelodysplastic syndrome) (HCC) 01/2023   Psoriatic arthritis (HCC)     SURGICAL HISTORY: Past Surgical History:  Procedure Laterality Date   COLONOSCOPY  08/2021   HIP SURGERY Bilateral    IR IMAGING GUIDED PORT INSERTION  02/20/2023   TONSILLECTOMY     TUBAL LIGATION      SOCIAL HISTORY: Social History   Socioeconomic History   Marital status: Widowed    Spouse name: Not on file   Number of children: Not on file   Years of education: Not on file   Highest education level: Not on file  Occupational History   Not on file  Tobacco Use   Smoking status: Former    Current packs/day: 0.00    Average packs/day: 0.1 packs/day for 33.4 years (3.3 ttl pk-yrs)    Types: Cigarettes    Start date: 19    Quit date: 11/18/2000    Years since quitting: 23.1   Smokeless tobacco: Never   Tobacco comments:    3 cigarettes a day  Vaping Use   Vaping status: Never Used  Substance and Sexual Activity   Alcohol use: Yes   Drug use: Never   Sexual activity: Not Currently  Other Topics Concern   Not on file  Social History Narrative   Lives with children   Social Drivers of Health   Financial Resource Strain: Not on file  Food Insecurity: No Food Insecurity (04/26/2023)   Hunger Vital Sign    Worried About Running Out of Food in the Last Year: Never true    Ran Out of Food in the Last Year: Never true  Transportation Needs: No Transportation Needs (04/26/2023)   PRAPARE - Administrator, Civil Service (Medical): No    Lack of Transportation (Non-Medical): No  Physical Activity: Not on file  Stress: Not on file  Social Connections: Not on file  Intimate Partner Violence: Not At Risk (04/26/2023)   Humiliation, Afraid, Rape, and Kick questionnaire    Fear of Current or Ex-Partner: No    Emotionally Abused: No    Physically Abused: No    Sexually Abused: No    FAMILY  HISTORY: Family History  Problem Relation Age of Onset   Asthma Mother    Allergic rhinitis Mother    Rheum arthritis Mother    Allergic rhinitis Father    Alzheimer's disease Father    Eczema Brother    Allergic rhinitis Brother    Colon cancer Neg Hx    Esophageal cancer Neg Hx    Stomach cancer Neg Hx    Rectal cancer Neg Hx     PHYSICAL EXAMINATION   Onc Performance Status - 01/07/24 1100       KPS SCALE   KPS % SCORE Able to carry on normal activity, minor s/s of disease           Vitals:   01/07/24 1048  BP: 137/70  Pulse: 67  Resp: 17  Temp: 98 F (36.7 C)  SpO2: 100%   Physical Exam Constitutional:      Appearance: Normal appearance.  Cardiovascular:     Rate and Rhythm: Normal rate and regular rhythm.  Pulmonary:     Effort: Pulmonary effort is normal.     Breath sounds: Normal breath sounds.  Abdominal:     General: Abdomen is flat.     Palpations: Abdomen is soft.  Musculoskeletal:        General: Normal range of motion.     Cervical back: Normal range of motion and neck supple. No rigidity.  Lymphadenopathy:     Cervical: No cervical adenopathy.  Skin:    General: Skin is warm and dry.  Neurological:     Mental Status: She is alert.  Psychiatric:        Mood and Affect: Mood normal.      LABORATORY DATA:  CBC    Component Value Date/Time   WBC 3.9 (L) 01/07/2024 1025   WBC 3.4 (L) 11/12/2023 0956   RBC 3.44 (L) 01/07/2024 1025   HGB 12.4 01/07/2024 1025   HCT 36.7 01/07/2024 1025   HCT 39.0 11/24/2022 1035   PLT 168 01/07/2024 1025   MCV 106.7 (H) 01/07/2024 1025   MCH 36.0 (H) 01/07/2024 1025   MCHC 33.8 01/07/2024 1025   RDW 18.3 (H) 01/07/2024 1025   LYMPHSABS 1.7 01/07/2024 1025   MONOABS 0.4 01/07/2024 1025   EOSABS 0.1 01/07/2024 1025   BASOSABS 0.1 01/07/2024 1025    CMP     Component Value Date/Time   NA 140 01/07/2024 1025   K 4.2 01/07/2024 1025   CL 105 01/07/2024 1025   CO2 28 01/07/2024 1025    GLUCOSE 99 01/07/2024 1025   BUN 11 01/07/2024 1025   CREATININE 0.43 (L) 01/07/2024 1025   CALCIUM 9.2 01/07/2024 1025   PROT 6.9 01/07/2024 1025   ALBUMIN 4.0 01/07/2024 1025   AST 14 (L) 01/07/2024 1025   ALT 10 01/07/2024 1025   ALKPHOS 46 01/07/2024 1025   BILITOT 0.6 01/07/2024 1025   GFRNONAA >60 01/07/2024 1025    ASSESSMENT and THERAPY PLAN:   MDS (myelodysplastic syndrome), high grade (HCC) Assessment and Plan Assessment & Plan  Myelodysplastic syndromes (MDS) on IDHIFA  50 mg po every other day. MDS is stable with satisfactory blood counts (reviewed today) She is not tolerating this well  Diarrhea due to Idhifa  Diarrhea occurs primarily the day after Idhifa , with urgency but not exceeding five times a day. Less severe but causes anxiety about bathroom proximity. - Advise taking Imodium preemptively on non-Idhifa  days, especially if going out. - Recommend starting with 2 mg Imodium in the morning before symptoms.  Nausea due to Idhifa  Nausea persists with Idhifa , managed with ondansetron . - Refill ondansetron  prescription, send to [facility] on [address].    All questions were answered. The patient knows to call the clinic with any problems, questions or concerns. We can certainly see the patient much sooner if necessary.  Total encounter time:30 minutes*in face-to-face visit time, chart review, lab review, care coordination, order entry, and documentation of the encounter time.    *Total Encounter Time as defined by the Centers for Medicare and Medicaid Services includes, in addition to the face-to-face time of a patient visit (documented in the note above) non-face-to-face time: obtaining and reviewing outside history, ordering and reviewing medications, tests or procedures, care coordination (communications with other health care professionals or caregivers) and documentation in the medical record.

## 2024-01-21 ENCOUNTER — Other Ambulatory Visit (HOSPITAL_COMMUNITY): Payer: Self-pay

## 2024-01-21 ENCOUNTER — Other Ambulatory Visit: Payer: Self-pay

## 2024-01-21 NOTE — Progress Notes (Signed)
 Specialty Pharmacy Ongoing Clinical Assessment Note  [name] [name] is a 78 y.o. female who is being followed by the specialty pharmacy service for RxSp Oncology   Patient's specialty medication(s) reviewed today: Enasidenib Mesylate  (IDHIFA )   Missed doses in the last 4 weeks: 2 (patient skipped 2 doses on vacation to avoid diarrhea)   Patient/Caregiver did not have any additional questions or concerns.   Therapeutic benefit summary: Patient is achieving benefit   Adverse events/side effects summary: Experienced adverse events/side effects (nausea (treated with Zofran ), fatigue, and diarrhrea (using Imodium))   Patient's therapy is appropriate to: Continue    Goals Addressed             This Visit's Progress    Maintain optimal adherence to therapy   On track    Patient is on track. Patient will maintain adherence          Follow up: 3 months  Silvano LOISE Dolly Specialty Pharmacist

## 2024-01-28 ENCOUNTER — Encounter: Payer: Self-pay | Admitting: Allergy & Immunology

## 2024-01-28 ENCOUNTER — Other Ambulatory Visit: Payer: Self-pay

## 2024-01-28 ENCOUNTER — Ambulatory Visit (INDEPENDENT_AMBULATORY_CARE_PROVIDER_SITE_OTHER): Admitting: Allergy & Immunology

## 2024-01-28 VITALS — BP 110/76 | HR 84 | Temp 98.2°F | Resp 19 | Ht 67.0 in | Wt 205.0 lb

## 2024-01-28 DIAGNOSIS — H1010 Acute atopic conjunctivitis, unspecified eye: Secondary | ICD-10-CM

## 2024-01-28 DIAGNOSIS — D469 Myelodysplastic syndrome, unspecified: Secondary | ICD-10-CM

## 2024-01-28 DIAGNOSIS — J302 Other seasonal allergic rhinitis: Secondary | ICD-10-CM

## 2024-01-28 DIAGNOSIS — J4540 Moderate persistent asthma, uncomplicated: Secondary | ICD-10-CM | POA: Diagnosis not present

## 2024-01-28 DIAGNOSIS — J3089 Other allergic rhinitis: Secondary | ICD-10-CM

## 2024-01-28 DIAGNOSIS — R0602 Shortness of breath: Secondary | ICD-10-CM

## 2024-01-28 DIAGNOSIS — H1013 Acute atopic conjunctivitis, bilateral: Secondary | ICD-10-CM

## 2024-01-28 MED ORDER — MONTELUKAST SODIUM 10 MG PO TABS: ORAL_TABLET | ORAL | 1 refills | Status: DC

## 2024-01-28 MED ORDER — RYALTRIS 665-25 MCG/ACT NA SUSP: 2.0000 | Freq: Every day | NASAL | 5 refills | Status: AC

## 2024-01-28 NOTE — Progress Notes (Signed)
 FOLLOW UP  Date of Service/Encounter:  01/28/24   Assessment:   Moderate persistent asthma, uncomplicated - previously on Xolair , but now stable without it   Seasonal perennial allergic rhinitis   Tinnitus - followed by Dr. Llewellyn     Complicated past medical history including psoriatic arthritis as well as diverticulitis - on Simponi   Myelodysplastic disease - with improving numbers on Idhifa    Adverse reaction to her chemotherapy (decitabine ) - with improvement with the triamcinolone  and prednisone     Shortness of breath - with mildly elevated D-dimer at 0.71    Plan/Recommendations:   1. Moderate persistent asthma, uncomplicated - Lung testing not done today.  - We are not going to make any changes today. - Daily controller medication(s): Singulair  10mg  daily and Breztri  160/4.55mcg two puffs twice daily with spacer - Prior to physical activity: albuterol  2 puffs 10-15 minutes before physical activity. - Rescue medications: albuterol  4 puffs every 4-6 hours as needed - Asthma control goals:  * Full participation in all desired activities (may need albuterol  before activity) * Albuterol  use two time or less a week on average (not counting use with activity) * Cough interfering with sleep two time or less a month * Oral steroids no more than once a year * No hospitalizations  2. Seasonal and perennial allergic rhinitis - Continue with Claritin or Allegra once daily. - Continue with Singulair  (montelukast ) 10mg  daily - Continue with Dymista  (fluticasone /azelastine ) two sprays per nostril twice daily as needed.  - Add on Pazeo one drop per eye twice daily as needed.   3. Return in about 6 months (around 07/30/2024). You can have the follow up appointment with Dr. Iva or a Nurse Practicioner (our Nurse Practitioners are excellent and always have Physician oversight!).   Subjective:   [name] [name] is a 78 y.o. female presenting today for follow up of   Chief Complaint  Patient presents with   Asthma    No recent asthma flares.    Allergic Rhinitis     Very itchy eyes and nasal congestion.    Rash    Covered in a rash on legs and arms. Unsure if cancer medication or infusion is the cause.     [name] [name] has a history of the following: 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) 04/26/2023   Hypokalemia 04/26/2023   Neutropenic fever (HCC) 04/25/2023   Hives 03/18/2023   MDS (myelodysplastic syndrome), high grade (HCC) 01/29/2023   Obesity 03/02/2021   Other long term (current) drug therapy 03/02/2021   Primary osteoarthritis 03/02/2021   SOB (shortness of breath) 12/08/2020   Pericardial effusion 12/08/2020   Dry eyes/dry mouth 05/25/2019   Bronchitis, mucopurulent recurrent (HCC) 02/26/2019   Moderate persistent asthma without complication 02/24/2019   Perennial and seasonal allergic rhinitis 02/24/2019   Seasonal allergic conjunctivitis 02/24/2019   Primary osteoarthritis of both knees 04/10/2016   Lung nodule 08/19/2014   HTN (hypertension) 08/07/2012   HLD (hyperlipidemia) 09/26/2011   Asthma 09/06/2010   Depression 09/06/2010   History of allergy 09/06/2010   Hypothyroidism 09/06/2010   Lower back pain 09/06/2010   Arthritis 09/06/2010   Pain in joint, pelvic region and thigh 09/06/2010   Peripheral neuropathy 09/06/2010   Psoriasis 09/06/2010   Therapeutic drug monitoring 09/06/2010   Tinnitus 09/06/2010   Vitamin B 12 deficiency 09/06/2010   Cervical spondylosis without myelopathy 04/24/2006   Tear of  lateral cartilage or meniscus of knee, current 03/13/2006    History obtained from: chart review and patient.  Discussed the use of AI scribe software for clinical note transcription with the patient and/or guardian, who gave verbal consent to proceed.  [name] [name] is a 78 y.o. female presenting for a follow up visit.  She was last  seen in December 2024.  At that time, we did not do lung testing.  We obtained a D-dimer to make sure that she did not have a pulmonary embolism.  This was slightly abnormal, but she did not want to go to the emergency room.  We got a chest x-ray as well.  We started her on Breztri  2 puffs twice daily.  We continue with Singulair .  For her rhinitis, we will continue with Claritin or Allegra daily as well as Singulair  and Dymista .  We did start her on prednisone .  Since last visit, she has done relatively well.   Asthma/Respiratory Symptom History: She is currently using Breztri  for respiratory symptoms, which is covered by her insurance, and she is satisfied with the medication. She lives independently in [place] and drives herself. [name]'s asthma has been well controlled. She has not required rescue medication, experienced nocturnal awakenings due to lower respiratory symptoms, nor have activities of daily living been limited. She has required no Emergency Department or Urgent Care visits for her asthma. She has required zero courses of systemic steroids for asthma exacerbations since the last visit. ACT score today is 25, indicating excellent asthma symptom control.   Allergic Rhinitis Symptom History: She experiences significant environmental allergy symptoms, including itchy and runny eyes and nose. She has been using Dymista  nasal spray, which was effective but is no longer covered by her insurance. Over-the-counter eye drops have been insufficient in managing her symptoms. She is currently out of Singulair , which she uses for allergy management, and does not require a refill of albuterol  at this time.   Her myelodysplastic syndrome (MDS) is managed with a medication regimen that was initiated after consultation with an MDS expert at [facility]. Initially, she was on 100 mg daily, which caused significant side effects, so the dose was reduced to 50 mg every other day. She reports that her numbers  improved and are currently good, although she continues to experience side effects such as nausea, described as 'morning sickness', and requires Imodium and Zofran  for symptom management. Prior infusions for MDS were less effective but had no side effects.  She has a history of psoriatic arthritis, for which she receives infusions. However, she suspects that her current MDS medication may be interfering with the effectiveness of her arthritis treatment, as her psoriasis has worsened, particularly on her legs.  Otherwise, there have been no changes to her past medical history, surgical history, family history, or social history.  Thank you!   Review of systems otherwise negative other than that mentioned in the HPI.    Objective:   Blood pressure 110/76, pulse 84, temperature 98.2 F (36.8 C), resp. rate 19, height 5' 7 (1.702 m), weight 205 lb (93 kg), SpO2 97%. Body mass index is 32.11 kg/m.    Physical Exam Vitals reviewed.  Constitutional:      Appearance: She is well-developed.     Comments: Pleasant.  Smiling. Interactive.   HENT:     Head: Normocephalic and atraumatic.     Right Ear: Tympanic membrane, ear canal and external ear normal.     Left Ear: Tympanic membrane, ear canal and  external ear normal.     Nose: No nasal deformity, septal deviation, mucosal edema or rhinorrhea.     Right Turbinates: Enlarged, swollen and pale.     Left Turbinates: Enlarged, swollen and pale.     Right Sinus: No maxillary sinus tenderness or frontal sinus tenderness.     Left Sinus: No maxillary sinus tenderness or frontal sinus tenderness.     Mouth/Throat:     Lips: Pink.     Mouth: Mucous membranes are moist. Mucous membranes are not pale and not dry.     Pharynx: Uvula midline.     Comments: Cobblestoning in the posterior oropharynx.  Eyes:     General: Lids are normal. No allergic shiner.       Right eye: No discharge.        Left eye: No discharge.     Conjunctiva/sclera:  Conjunctivae normal.     Right eye: Right conjunctiva is not injected. No chemosis.    Left eye: Left conjunctiva is not injected. No chemosis.    Pupils: Pupils are equal, round, and reactive to light.  Cardiovascular:     Rate and Rhythm: Normal rate and regular rhythm.     Heart sounds: Normal heart sounds.  Pulmonary:     Effort: Pulmonary effort is normal. No tachypnea, accessory muscle usage or respiratory distress.     Breath sounds: Normal breath sounds. No wheezing, rhonchi or rales.     Comments: Moving air well in all lung fields. No increased work of breathing noted.  She does have tenderness to palpation over the anterior right side of her chest. Chest:     Chest wall: No tenderness.  Lymphadenopathy:     Cervical: No cervical adenopathy.  Skin:    General: Skin is warm.     Capillary Refill: Capillary refill takes less than 2 seconds.     Coloration: Skin is not pale.     Findings: No abrasion, erythema, petechiae or rash. Rash is not papular, urticarial or vesicular.     Comments: No eczematous or urticarial lesions noted.   Neurological:     Mental Status: She is alert.  Psychiatric:        Behavior: Behavior is cooperative.      Diagnostic studies: none       Marty Shaggy, MD  [facility] 

## 2024-01-28 NOTE — Patient Instructions (Addendum)
 1. Moderate persistent asthma, uncomplicated - Lung testing looks great today.  - We are not going to make any changes today. - Daily controller medication(s): Singulair  10mg  daily and Breztri  160/4.63mcg two puffs twice daily with spacer - Prior to physical activity: albuterol  2 puffs 10-15 minutes before physical activity. - Rescue medications: albuterol  4 puffs every 4-6 hours as needed - Asthma control goals:  * Full participation in all desired activities (may need albuterol  before activity) * Albuterol  use two time or less a week on average (not counting use with activity) * Cough interfering with sleep two time or less a month * Oral steroids no more than once a year * No hospitalizations  2. Seasonal and perennial allergic rhinitis - Continue with Claritin or Allegra once daily. - Continue with Singulair  (montelukast ) 10mg  daily - Continue with Dymista  (fluticasone /azelastine ) two sprays per nostril twice daily as needed.  - Add on Pazeo one drop per eye twice daily as needed.   3. Return in about 6 months (around 07/30/2024). You can have the follow up appointment with Dr. Iva or a Nurse Practicioner (our Nurse Practitioners are excellent and always have Physician oversight!).    Please inform us  of any Emergency Department visits, hospitalizations, or changes in symptoms. Call us  before going to the ED for breathing or allergy symptoms since we might be able to fit you in for a sick visit. Feel free to contact us  anytime with any questions, problems, or concerns.  It was a pleasure to see you again today!  Websites that have reliable patient information: 1. American Academy of Asthma, Allergy, and Immunology: www.aaaai.org 2. Food Allergy Research and Education (FARE): foodallergy.org 3. Mothers of Asthmatics: http://www.asthmacommunitynetwork.org 4. American College of Allergy, Asthma, and Immunology: www.acaai.org      "Like" us  on Facebook and Instagram for our  latest updates!      A healthy democracy works best when [employer] participate! Make sure you are registered to vote! If you have moved or changed any of your contact information, you will need to get this updated before voting! Scan the QR codes below to learn more!

## 2024-02-02 ENCOUNTER — Encounter: Payer: Self-pay | Admitting: Allergy & Immunology

## 2024-02-03 MED ORDER — OLOPATADINE HCL 0.2 % OP SOLN: OPHTHALMIC | 2 refills | Status: DC

## 2024-02-04 ENCOUNTER — Inpatient Hospital Stay: Attending: Hematology and Oncology

## 2024-02-04 ENCOUNTER — Inpatient Hospital Stay (HOSPITAL_BASED_OUTPATIENT_CLINIC_OR_DEPARTMENT_OTHER): Admitting: Hematology and Oncology

## 2024-02-04 VITALS — BP 128/73 | HR 77 | Temp 98.7°F | Resp 15 | Wt 203.6 lb

## 2024-02-04 DIAGNOSIS — Z8261 Family history of arthritis: Secondary | ICD-10-CM | POA: Diagnosis not present

## 2024-02-04 DIAGNOSIS — Z87891 Personal history of nicotine dependence: Secondary | ICD-10-CM | POA: Insufficient documentation

## 2024-02-04 DIAGNOSIS — K529 Noninfective gastroenteritis and colitis, unspecified: Secondary | ICD-10-CM | POA: Diagnosis not present

## 2024-02-04 DIAGNOSIS — J4540 Moderate persistent asthma, uncomplicated: Secondary | ICD-10-CM | POA: Insufficient documentation

## 2024-02-04 DIAGNOSIS — Z9089 Acquired absence of other organs: Secondary | ICD-10-CM | POA: Diagnosis not present

## 2024-02-04 DIAGNOSIS — Z825 Family history of asthma and other chronic lower respiratory diseases: Secondary | ICD-10-CM | POA: Diagnosis not present

## 2024-02-04 DIAGNOSIS — L4050 Arthropathic psoriasis, unspecified: Secondary | ICD-10-CM | POA: Insufficient documentation

## 2024-02-04 DIAGNOSIS — Z818 Family history of other mental and behavioral disorders: Secondary | ICD-10-CM | POA: Insufficient documentation

## 2024-02-04 DIAGNOSIS — D46Z Other myelodysplastic syndromes: Secondary | ICD-10-CM

## 2024-02-04 DIAGNOSIS — Z91030 Bee allergy status: Secondary | ICD-10-CM | POA: Diagnosis not present

## 2024-02-04 DIAGNOSIS — Z79899 Other long term (current) drug therapy: Secondary | ICD-10-CM | POA: Diagnosis not present

## 2024-02-04 DIAGNOSIS — D469 Myelodysplastic syndrome, unspecified: Secondary | ICD-10-CM | POA: Insufficient documentation

## 2024-02-04 LAB — CBC WITH DIFFERENTIAL (CANCER CENTER ONLY)
Abs Immature Granulocytes: 0.01 K/uL (ref 0.00–0.07)
Basophils Absolute: 0 K/uL (ref 0.0–0.1)
Basophils Relative: 1 %
Eosinophils Absolute: 0.1 K/uL (ref 0.0–0.5)
Eosinophils Relative: 2 %
HCT: 37.7 % (ref 36.0–46.0)
Hemoglobin: 12.8 g/dL (ref 12.0–15.0)
Immature Granulocytes: 0 %
Lymphocytes Relative: 43 %
Lymphs Abs: 1.6 K/uL (ref 0.7–4.0)
MCH: 36.4 pg — ABNORMAL HIGH (ref 26.0–34.0)
MCHC: 34 g/dL (ref 30.0–36.0)
MCV: 107.1 fL — ABNORMAL HIGH (ref 80.0–100.0)
Monocytes Absolute: 0.4 K/uL (ref 0.1–1.0)
Monocytes Relative: 11 %
Neutro Abs: 1.6 K/uL — ABNORMAL LOW (ref 1.7–7.7)
Neutrophils Relative %: 43 %
Platelet Count: 168 K/uL (ref 150–400)
RBC: 3.52 MIL/uL — ABNORMAL LOW (ref 3.87–5.11)
RDW: 16.6 % — ABNORMAL HIGH (ref 11.5–15.5)
WBC Count: 3.7 K/uL — ABNORMAL LOW (ref 4.0–10.5)
nRBC: 0 % (ref 0.0–0.2)

## 2024-02-04 LAB — CMP (CANCER CENTER ONLY)
ALT: 11 U/L (ref 0–44)
AST: 15 U/L (ref 15–41)
Albumin: 4.1 g/dL (ref 3.5–5.0)
Alkaline Phosphatase: 49 U/L (ref 38–126)
Anion gap: 6 (ref 5–15)
BUN: 12 mg/dL (ref 8–23)
CO2: 28 mmol/L (ref 22–32)
Calcium: 8.9 mg/dL (ref 8.9–10.3)
Chloride: 105 mmol/L (ref 98–111)
Creatinine: 0.41 mg/dL — ABNORMAL LOW (ref 0.44–1.00)
GFR, Estimated: >60 mL/min (ref >60)
Glucose, Bld: 95 mg/dL (ref 70–99)
Potassium: 4.1 mmol/L (ref 3.5–5.1)
Sodium: 139 mmol/L (ref 135–145)
Total Bilirubin: 0.6 mg/dL (ref 0.0–1.2)
Total Protein: 6.7 g/dL (ref 6.5–8.1)

## 2024-02-04 MED ORDER — SODIUM CHLORIDE 0.9% FLUSH
10.0000 mL | Freq: Once | INTRAVENOUS | Status: AC
  Administered 2024-02-04: 10 mL

## 2024-02-04 NOTE — Assessment & Plan Note (Signed)
 Assessment and Plan Assessment & Plan Myelodysplastic syndrome, high grade, intermediate risk MDS managed with reduced dose of IDHIFA  due to side effects. Blood work: WBC 3,700, Hgb 12.8, Plt 168,000.  - She is not tolerating this well at all, she almost wants to go back on decitabine . - Consider venetoclax if insurance approves. - Maintain current IDHIFA  dose and labs every 4 weeks.  Chronic diarrhea and nausea secondary to MDS treatment Chronic diarrhea and nausea from IDHIFA  managed with Zofran  and Imodium.  Concern about constipation with daily Imodium.  Inflammatory arthritis Inflammatory arthritis managed with infusions. Reports suboptimal symptom control. - Continue scheduled arthritis infusion next week.  FU in 4 weeks with labs

## 2024-02-04 NOTE — Progress Notes (Signed)
 [facility] Cancer Follow up:    [name], [name] ORN, MD [address] [place] [address]   DIAGNOSIS: Myelodysplastic Syndrome  SUMMARY OF ONCOLOGIC HISTORY: Oncology History  MDS (myelodysplastic syndrome), high grade (HCC)  01/07/2023 Initial Biopsy   Bone marrow biopsy:cellular bone marrow with focal areas of hypercellularity (approximately up to  50%).  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  Blasts appear mildly mildly increased, approximately 5% to focally up to 10%.  Flow cytometric analysis reveals 5% CD34 positive blasts.  While the findings could be attributable to the patient's coexisting conditions, the possibility of a myeloid neoplasm such as myelodysplastic neoplasm with increased blasts cannot be entirely excluded. normal karyotype   01/29/2023 Initial Diagnosis   MDS (myelodysplastic syndrome), high grade (HCC)   02/04/2023 - 02/06/2023 Chemotherapy   Patient is on Treatment Plan : MYELODYSPLASIA  Azacitidine  IV D1-5 q28d     03/04/2023 - 07/29/2023 Chemotherapy   Patient is on Treatment Plan : MYELODYSPLASIA Decitabine  D1-5 q28d      Normal karyotype - 47, XX NGS - ASXL1, CBL, IDH2 and SRSF2 mutations.  PB CBC: WBC 2.4, 29% neutrophils - ANC 696, hgb 11.1, platelets 61  01/29/2023 Initial Diagnosis  MDS (myelodysplastic syndrome), high grade (CMD)   CURRENT THERAPY: Azacitidine   History of Present Illness  Discussed the use of AI scribe software for clinical note transcription with the patient, who gave verbal consent to proceed.  [name] [name] is a 78 year old female with myelodysplastic syndrome who presents with gastrointestinal symptoms and medication management issues.  She experiences significant gastrointestinal symptoms, including nausea and diarrhea, exacerbated by her current medication regimen. Symptoms occur at 4 AM, requiring Zofran  and Imodium for management. She uses Imodium primarily for outings, expressing concern  about its frequency and potential for constipation.  She takes her medication every other day, describing it as 'tiresome' and 'depressing' due to persistent symptoms, which are more pronounced the day after medication intake. Despite this, she acknowledges the medication's efficacy but notes significant disruption to her daily life.  Previously, she was on an infusion treatment that was effective but required monitoring due to low platelet counts, leading to one transfusion. She initially experienced a rash with the infusion but no other significant side effects. She is hoping to return to this treatment since she tolerated this so much better.  Her psoriasis has worsened, described as 'like an alligator' on her head, legs, and arms. She is due for an infusion for her arthritis next week and is concerned about potential interactions between her current medication and the arthritis treatment.  Current blood work shows a white blood cell count of 3,700, hemoglobin of 12.8, and platelets at 168,000.  Rest of the pertinent 10 point ROS reviewed and neg.  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) 04/26/2023   Hypokalemia 04/26/2023   Neutropenic fever (HCC) 04/25/2023   Hives 03/18/2023   MDS (myelodysplastic syndrome), high grade (HCC) 01/29/2023   Obesity 03/02/2021   Other long term (current) drug therapy 03/02/2021   Primary osteoarthritis 03/02/2021   SOB (shortness of breath) 12/08/2020   Pericardial effusion 12/08/2020   Dry eyes/dry mouth 05/25/2019   Bronchitis, mucopurulent recurrent (HCC) 02/26/2019   Moderate persistent asthma without complication 02/24/2019   Perennial and seasonal allergic rhinitis 02/24/2019   Seasonal allergic conjunctivitis 02/24/2019   Primary osteoarthritis of both knees 04/10/2016   Lung  nodule 08/19/2014   HTN (hypertension) 08/07/2012   HLD (hyperlipidemia) 09/26/2011   Asthma  09/06/2010   Depression 09/06/2010   History of allergy 09/06/2010   Hypothyroidism 09/06/2010   Lower back pain 09/06/2010   Arthritis 09/06/2010   Pain in joint, pelvic region and thigh 09/06/2010   Peripheral neuropathy 09/06/2010   Psoriasis 09/06/2010   Therapeutic drug monitoring 09/06/2010   Tinnitus 09/06/2010   Vitamin B 12 deficiency 09/06/2010   Cervical spondylosis without myelopathy 04/24/2006   Tear of lateral cartilage or meniscus of knee, current 03/13/2006    is allergic to bee pollen, latex, pollen extract-tree extract [pollen extract], cat dander, cat hair extract, dust mite extract, and molds & smuts.  MEDICAL HISTORY: Past Medical History:  Diagnosis Date   Asthma    Diverticulitis    Hypothyroid    MDS (myelodysplastic syndrome) (HCC) 01/2023   Psoriatic arthritis (HCC)     SURGICAL HISTORY: Past Surgical History:  Procedure Laterality Date   COLONOSCOPY  08/2021   HIP SURGERY Bilateral    IR IMAGING GUIDED PORT INSERTION  02/20/2023   TONSILLECTOMY     TUBAL LIGATION      SOCIAL HISTORY: Social History   Socioeconomic History   Marital status: Widowed    Spouse name: Not on file   Number of children: Not on file   Years of education: Not on file   Highest education level: Not on file  Occupational History   Not on file  Tobacco Use   Smoking status: Former    Current packs/day: 0.00    Average packs/day: 0.1 packs/day for 33.4 years (3.3 ttl pk-yrs)    Types: Cigarettes    Start date: 49    Quit date: 11/18/2000    Years since quitting: 23.2   Smokeless tobacco: Never   Tobacco comments:    3 cigarettes a day  Vaping Use   Vaping status: Never Used  Substance and Sexual Activity   Alcohol use: Yes   Drug use: Never   Sexual activity: Not Currently  Other Topics Concern   Not on file  Social History Narrative   Lives with children   Social Drivers of Health   Financial Resource Strain: Not on file  Food Insecurity: No Food  Insecurity (04/26/2023)   Hunger Vital Sign    Worried About Running Out of Food in the Last Year: Never true    Ran Out of Food in the Last Year: Never true  Transportation Needs: No Transportation Needs (04/26/2023)   PRAPARE - Administrator, Civil Service (Medical): No    Lack of Transportation (Non-Medical): No  Physical Activity: Not on file  Stress: Not on file  Social Connections: Not on file  Intimate Partner Violence: Not At Risk (04/26/2023)   Humiliation, Afraid, Rape, and Kick questionnaire    Fear of Current or Ex-Partner: No    Emotionally Abused: No    Physically Abused: No    Sexually Abused: No    FAMILY HISTORY: Family History  Problem Relation Age of Onset   Asthma Mother    Allergic rhinitis Mother    Rheum arthritis Mother    Allergic rhinitis Father    Alzheimer's disease Father    Eczema Brother    Allergic rhinitis Brother    Colon cancer Neg Hx    Esophageal cancer Neg Hx    Stomach cancer Neg Hx    Rectal cancer Neg Hx     PHYSICAL EXAMINATION  Onc Performance Status - 02/04/24 1100       KPS SCALE   KPS % SCORE Able to carry on normal activity, minor s/s of disease           Vitals:   02/04/24 1157  BP: 128/73  Pulse: 77  Resp: 15  Temp: 98.7 F (37.1 C)  SpO2: 99%   Physical Exam Constitutional:      Appearance: Normal appearance.  Cardiovascular:     Rate and Rhythm: Normal rate and regular rhythm.  Pulmonary:     Effort: Pulmonary effort is normal.     Breath sounds: Normal breath sounds.  Abdominal:     General: Abdomen is flat.     Palpations: Abdomen is soft.  Musculoskeletal:        General: Normal range of motion.     Cervical back: Normal range of motion and neck supple. No rigidity.  Lymphadenopathy:     Cervical: No cervical adenopathy.  Skin:    General: Skin is warm and dry.  Neurological:     Mental Status: She is alert.  Psychiatric:        Mood and Affect: Mood normal.       LABORATORY DATA:  CBC    Component Value Date/Time   WBC 3.7 (L) 02/04/2024 1133   WBC 3.4 (L) 11/12/2023 0956   RBC 3.52 (L) 02/04/2024 1133   HGB 12.8 02/04/2024 1133   HCT 37.7 02/04/2024 1133   HCT 39.0 11/24/2022 1035   PLT 168 02/04/2024 1133   MCV 107.1 (H) 02/04/2024 1133   MCH 36.4 (H) 02/04/2024 1133   MCHC 34.0 02/04/2024 1133   RDW 16.6 (H) 02/04/2024 1133   LYMPHSABS 1.6 02/04/2024 1133   MONOABS 0.4 02/04/2024 1133   EOSABS 0.1 02/04/2024 1133   BASOSABS 0.0 02/04/2024 1133    CMP     Component Value Date/Time   NA 139 02/04/2024 1133   K 4.1 02/04/2024 1133   CL 105 02/04/2024 1133   CO2 28 02/04/2024 1133   GLUCOSE 95 02/04/2024 1133   BUN 12 02/04/2024 1133   CREATININE 0.41 (L) 02/04/2024 1133   CALCIUM 8.9 02/04/2024 1133   PROT 6.7 02/04/2024 1133   ALBUMIN 4.1 02/04/2024 1133   AST 15 02/04/2024 1133   ALT 11 02/04/2024 1133   ALKPHOS 49 02/04/2024 1133   BILITOT 0.6 02/04/2024 1133   GFRNONAA >60 02/04/2024 1133    ASSESSMENT and THERAPY PLAN:   MDS (myelodysplastic syndrome), high grade (HCC) Assessment and Plan Assessment & Plan Myelodysplastic syndrome, high grade, intermediate risk MDS managed with reduced dose of IDHIFA  due to side effects. Blood work: WBC 3,700, Hgb 12.8, Plt 168,000.  - She is not tolerating this well at all, she almost wants to go back on decitabine . - Consider venetoclax if insurance approves. - Maintain current IDHIFA  dose and labs every 4 weeks.  Chronic diarrhea and nausea secondary to MDS treatment Chronic diarrhea and nausea from IDHIFA  managed with Zofran  and Imodium.  Concern about constipation with daily Imodium.  Inflammatory arthritis Inflammatory arthritis managed with infusions. Reports suboptimal symptom control. - Continue scheduled arthritis infusion next week.  FU in 4 weeks with labs   All questions were answered. The patient knows to call the clinic with any problems, questions  or concerns. We can certainly see the patient much sooner if necessary.  Total encounter time:30 minutes*in face-to-face visit time, chart review, lab review, care coordination, order entry, and documentation of the encounter  time.    *Total Encounter Time as defined by the Centers for Medicare and Medicaid Services includes, in addition to the face-to-face time of a patient visit (documented in the note above) non-face-to-face time: obtaining and reviewing outside history, ordering and reviewing medications, tests or procedures, care coordination (communications with other health care professionals or caregivers) and documentation in the medical record.

## 2024-02-06 ENCOUNTER — Other Ambulatory Visit: Payer: Self-pay

## 2024-02-14 ENCOUNTER — Encounter: Payer: Self-pay | Admitting: Hematology and Oncology

## 2024-02-18 ENCOUNTER — Other Ambulatory Visit: Payer: Self-pay

## 2024-02-25 ENCOUNTER — Encounter: Payer: Self-pay | Admitting: Hematology and Oncology

## 2024-03-06 ENCOUNTER — Inpatient Hospital Stay: Admitting: Hematology and Oncology

## 2024-03-06 ENCOUNTER — Inpatient Hospital Stay: Attending: Hematology and Oncology

## 2024-03-06 VITALS — BP 143/67 | HR 70 | Temp 98.1°F | Resp 14 | Wt 205.1 lb

## 2024-03-06 DIAGNOSIS — E039 Hypothyroidism, unspecified: Secondary | ICD-10-CM | POA: Insufficient documentation

## 2024-03-06 DIAGNOSIS — D61810 Antineoplastic chemotherapy induced pancytopenia: Secondary | ICD-10-CM | POA: Insufficient documentation

## 2024-03-06 DIAGNOSIS — R5383 Other fatigue: Secondary | ICD-10-CM | POA: Diagnosis not present

## 2024-03-06 DIAGNOSIS — D46Z Other myelodysplastic syndromes: Secondary | ICD-10-CM | POA: Insufficient documentation

## 2024-03-06 DIAGNOSIS — J4540 Moderate persistent asthma, uncomplicated: Secondary | ICD-10-CM | POA: Diagnosis not present

## 2024-03-06 DIAGNOSIS — Z825 Family history of asthma and other chronic lower respiratory diseases: Secondary | ICD-10-CM | POA: Diagnosis not present

## 2024-03-06 DIAGNOSIS — Z840 Family history of diseases of the skin and subcutaneous tissue: Secondary | ICD-10-CM | POA: Diagnosis not present

## 2024-03-06 DIAGNOSIS — Z87891 Personal history of nicotine dependence: Secondary | ICD-10-CM | POA: Insufficient documentation

## 2024-03-06 DIAGNOSIS — M170 Bilateral primary osteoarthritis of knee: Secondary | ICD-10-CM | POA: Diagnosis not present

## 2024-03-06 DIAGNOSIS — R197 Diarrhea, unspecified: Secondary | ICD-10-CM | POA: Insufficient documentation

## 2024-03-06 DIAGNOSIS — Z91040 Latex allergy status: Secondary | ICD-10-CM | POA: Insufficient documentation

## 2024-03-06 DIAGNOSIS — Z9089 Acquired absence of other organs: Secondary | ICD-10-CM | POA: Diagnosis not present

## 2024-03-06 DIAGNOSIS — Z79899 Other long term (current) drug therapy: Secondary | ICD-10-CM | POA: Insufficient documentation

## 2024-03-06 DIAGNOSIS — K5900 Constipation, unspecified: Secondary | ICD-10-CM | POA: Diagnosis not present

## 2024-03-06 DIAGNOSIS — E876 Hypokalemia: Secondary | ICD-10-CM | POA: Diagnosis not present

## 2024-03-06 DIAGNOSIS — L4050 Arthropathic psoriasis, unspecified: Secondary | ICD-10-CM | POA: Diagnosis not present

## 2024-03-06 DIAGNOSIS — R1310 Dysphagia, unspecified: Secondary | ICD-10-CM | POA: Insufficient documentation

## 2024-03-06 DIAGNOSIS — Z820 Family history of epilepsy and other diseases of the nervous system: Secondary | ICD-10-CM | POA: Insufficient documentation

## 2024-03-06 DIAGNOSIS — Z8261 Family history of arthritis: Secondary | ICD-10-CM | POA: Diagnosis not present

## 2024-03-06 DIAGNOSIS — Z91030 Bee allergy status: Secondary | ICD-10-CM | POA: Diagnosis not present

## 2024-03-06 LAB — CBC WITH DIFFERENTIAL (CANCER CENTER ONLY)
Abs Immature Granulocytes: 0.01 K/uL (ref 0.00–0.07)
Basophils Absolute: 0 K/uL (ref 0.0–0.1)
Basophils Relative: 1 %
Eosinophils Absolute: 0 K/uL (ref 0.0–0.5)
Eosinophils Relative: 1 %
HCT: 38.2 % (ref 36.0–46.0)
Hemoglobin: 12.9 g/dL (ref 12.0–15.0)
Immature Granulocytes: 0 %
Lymphocytes Relative: 52 %
Lymphs Abs: 1.8 K/uL (ref 0.7–4.0)
MCH: 36 pg — ABNORMAL HIGH (ref 26.0–34.0)
MCHC: 33.8 g/dL (ref 30.0–36.0)
MCV: 106.7 fL — ABNORMAL HIGH (ref 80.0–100.0)
Monocytes Absolute: 0.4 K/uL (ref 0.1–1.0)
Monocytes Relative: 10 %
Neutro Abs: 1.3 K/uL — ABNORMAL LOW (ref 1.7–7.7)
Neutrophils Relative %: 36 %
Platelet Count: 183 K/uL (ref 150–400)
RBC: 3.58 MIL/uL — ABNORMAL LOW (ref 3.87–5.11)
RDW: 15.4 % (ref 11.5–15.5)
WBC Count: 3.5 K/uL — ABNORMAL LOW (ref 4.0–10.5)
nRBC: 0 % (ref 0.0–0.2)

## 2024-03-06 LAB — CMP (CANCER CENTER ONLY)
ALT: 12 U/L (ref 0–44)
AST: 16 U/L (ref 15–41)
Albumin: 4.1 g/dL (ref 3.5–5.0)
Alkaline Phosphatase: 53 U/L (ref 38–126)
Anion gap: 5 (ref 5–15)
BUN: 12 mg/dL (ref 8–23)
CO2: 29 mmol/L (ref 22–32)
Calcium: 8.9 mg/dL (ref 8.9–10.3)
Chloride: 104 mmol/L (ref 98–111)
Creatinine: 0.46 mg/dL (ref 0.44–1.00)
GFR, Estimated: >60 mL/min (ref >60)
Glucose, Bld: 88 mg/dL (ref 70–99)
Potassium: 4.5 mmol/L (ref 3.5–5.1)
Sodium: 138 mmol/L (ref 135–145)
Total Bilirubin: 0.4 mg/dL (ref 0.0–1.2)
Total Protein: 7.1 g/dL (ref 6.5–8.1)

## 2024-03-06 NOTE — Progress Notes (Signed)
 [facility] Cancer Follow up:    [name], [name] ORN, MD [address] [place] [address]   DIAGNOSIS: Myelodysplastic Syndrome  SUMMARY OF ONCOLOGIC HISTORY: Oncology History  MDS (myelodysplastic syndrome), high grade (HCC)  01/07/2023 Initial Biopsy   Bone marrow biopsy:cellular bone marrow with focal areas of hypercellularity (approximately up to  50%).  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  Blasts appear mildly mildly increased, approximately 5% to focally up to 10%.  Flow cytometric analysis reveals 5% CD34 positive blasts.  While the findings could be attributable to the patient's coexisting conditions, the possibility of a myeloid neoplasm such as myelodysplastic neoplasm with increased blasts cannot be entirely excluded. normal karyotype   01/29/2023 Initial Diagnosis   MDS (myelodysplastic syndrome), high grade (HCC)   02/04/2023 - 02/06/2023 Chemotherapy   Patient is on Treatment Plan : MYELODYSPLASIA  Azacitidine  IV D1-5 q28d     03/04/2023 - 07/29/2023 Chemotherapy   Patient is on Treatment Plan : MYELODYSPLASIA Decitabine  D1-5 q28d      Normal karyotype - 24, XX NGS - ASXL1, CBL, IDH2 and SRSF2 mutations.  PB CBC: WBC 2.4, 29% neutrophils - ANC 696, hgb 11.1, platelets 61  01/29/2023 Initial Diagnosis  MDS (myelodysplastic syndrome), high grade (CMD)   CURRENT THERAPY: Azacitidine   History of Present Illness  Discussed the use of AI scribe software for clinical note transcription with the patient, who gave verbal consent to proceed. She has been on IDHIFA , takes them every other day.  [name] [name] is a 78 year old female with myelodysplasia who presents for follow-up regarding her treatment regimen.  She manages her myelodysplasia with medication taken every other day, which remains effective. Recent lab results show slightly low normal white blood cell count, hemoglobin at 12.9, and platelets at 183,000. She occasionally adjusts the  medication schedule based on her activities.  She experiences fatigue but remains active by going to the gym and working with a physical therapist on balance. Despite the fatigue, she is able to maintain her daily activities.  She experiences diarrhea typically the day after taking her medication, which she manages with Imodium. Zofran , taken for nausea, also helps with the diarrhea by causing constipation. She takes Zofran  most days, usually in the morning.  Recently, she has experienced symptoms of reflux, described as a gross sensation in her mouth. She suspects dairy products, including yogurt, may contribute to this issue. She has not been taking her prescribed pantoprazole  but has used famotidine  as needed.  She mentions that her shins feel cold. A dermatologist informed her that skin markings on her shins are permanent due to oxidized blood.  Rest of the pertinent 10 point ROS reviewed and neg.  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) 04/26/2023   Hypokalemia 04/26/2023   Neutropenic fever (HCC) 04/25/2023   Hives 03/18/2023   MDS (myelodysplastic syndrome), high grade (HCC) 01/29/2023   Obesity 03/02/2021   Other long term (current) drug therapy 03/02/2021   Primary osteoarthritis 03/02/2021   SOB (shortness of breath) 12/08/2020   Pericardial effusion 12/08/2020   Dry eyes/dry mouth 05/25/2019   Bronchitis, mucopurulent recurrent (HCC) 02/26/2019   Moderate persistent asthma without complication 02/24/2019   Perennial and seasonal allergic rhinitis 02/24/2019   Seasonal allergic conjunctivitis 02/24/2019   Primary osteoarthritis of both knees 04/10/2016   Lung nodule 08/19/2014   HTN (hypertension) 08/07/2012   HLD (hyperlipidemia) 09/26/2011  Asthma 09/06/2010   Depression 09/06/2010   History of allergy 09/06/2010   Hypothyroidism 09/06/2010   Lower back pain 09/06/2010   Arthritis 09/06/2010    Pain in joint, pelvic region and thigh 09/06/2010   Peripheral neuropathy 09/06/2010   Psoriasis 09/06/2010   Therapeutic drug monitoring 09/06/2010   Tinnitus 09/06/2010   Vitamin B 12 deficiency 09/06/2010   Cervical spondylosis without myelopathy 04/24/2006   Tear of lateral cartilage or meniscus of knee, current 03/13/2006    is allergic to bee pollen, latex, pollen extract-tree extract [pollen extract], cat dander, cat hair extract, dust mite extract, and molds & smuts.  MEDICAL HISTORY: Past Medical History:  Diagnosis Date   Asthma    Diverticulitis    Hypothyroid    MDS (myelodysplastic syndrome) (HCC) 01/2023   Psoriatic arthritis (HCC)     SURGICAL HISTORY: Past Surgical History:  Procedure Laterality Date   COLONOSCOPY  08/2021   HIP SURGERY Bilateral    IR IMAGING GUIDED PORT INSERTION  02/20/2023   TONSILLECTOMY     TUBAL LIGATION      SOCIAL HISTORY: Social History   Socioeconomic History   Marital status: Widowed    Spouse name: Not on file   Number of children: Not on file   Years of education: Not on file   Highest education level: Not on file  Occupational History   Not on file  Tobacco Use   Smoking status: Former    Current packs/day: 0.00    Average packs/day: 0.1 packs/day for 33.4 years (3.3 ttl pk-yrs)    Types: Cigarettes    Start date: 32    Quit date: 11/18/2000    Years since quitting: 23.3   Smokeless tobacco: Never   Tobacco comments:    3 cigarettes a day  Vaping Use   Vaping status: Never Used  Substance and Sexual Activity   Alcohol use: Yes   Drug use: Never   Sexual activity: Not Currently  Other Topics Concern   Not on file  Social History Narrative   Lives with children   Social Drivers of Health   Financial Resource Strain: Not on file  Food Insecurity: No Food Insecurity (04/26/2023)   Hunger Vital Sign    Worried About Running Out of Food in the Last Year: Never true    Ran Out of Food in the Last Year:  Never true  Transportation Needs: No Transportation Needs (04/26/2023)   PRAPARE - Administrator, Civil Service (Medical): No    Lack of Transportation (Non-Medical): No  Physical Activity: Not on file  Stress: Not on file  Social Connections: Not on file  Intimate Partner Violence: Not At Risk (04/26/2023)   Humiliation, Afraid, Rape, and Kick questionnaire    Fear of Current or Ex-Partner: No    Emotionally Abused: No    Physically Abused: No    Sexually Abused: No    FAMILY HISTORY: Family History  Problem Relation Age of Onset   Asthma Mother    Allergic rhinitis Mother    Rheum arthritis Mother    Allergic rhinitis Father    Alzheimer's disease Father    Eczema Brother    Allergic rhinitis Brother    Colon cancer Neg Hx    Esophageal cancer Neg Hx    Stomach cancer Neg Hx    Rectal cancer Neg Hx     PHYSICAL EXAMINATION   Onc Performance Status - 03/06/24 1100       KPS  SCALE   KPS % SCORE Able to carry on normal activity, minor s/s of disease           Vitals:   03/06/24 1152  BP: (!) 143/67  Pulse: 70  Resp: 14  Temp: 98.1 F (36.7 C)  SpO2: 99%   Physical Exam Constitutional:      Appearance: Normal appearance.  Cardiovascular:     Rate and Rhythm: Normal rate and regular rhythm.  Pulmonary:     Effort: Pulmonary effort is normal.     Breath sounds: Normal breath sounds.  Abdominal:     General: Abdomen is flat.     Palpations: Abdomen is soft.  Musculoskeletal:        General: Normal range of motion.     Cervical back: Normal range of motion and neck supple. No rigidity.  Lymphadenopathy:     Cervical: No cervical adenopathy.  Skin:    General: Skin is warm and dry.  Neurological:     Mental Status: She is alert.  Psychiatric:        Mood and Affect: Mood normal.      LABORATORY DATA:  CBC    Component Value Date/Time   WBC 3.5 (L) 03/06/2024 1116   WBC 3.4 (L) 11/12/2023 0956   RBC 3.58 (L) 03/06/2024 1116    HGB 12.9 03/06/2024 1116   HCT 38.2 03/06/2024 1116   HCT 39.0 11/24/2022 1035   PLT 183 03/06/2024 1116   MCV 106.7 (H) 03/06/2024 1116   MCH 36.0 (H) 03/06/2024 1116   MCHC 33.8 03/06/2024 1116   RDW 15.4 03/06/2024 1116   LYMPHSABS 1.8 03/06/2024 1116   MONOABS 0.4 03/06/2024 1116   EOSABS 0.0 03/06/2024 1116   BASOSABS 0.0 03/06/2024 1116    CMP     Component Value Date/Time   NA 138 03/06/2024 1116   K 4.5 03/06/2024 1116   CL 104 03/06/2024 1116   CO2 29 03/06/2024 1116   GLUCOSE 88 03/06/2024 1116   BUN 12 03/06/2024 1116   CREATININE 0.46 03/06/2024 1116   CALCIUM 8.9 03/06/2024 1116   PROT 7.1 03/06/2024 1116   ALBUMIN 4.1 03/06/2024 1116   AST 16 03/06/2024 1116   ALT 12 03/06/2024 1116   ALKPHOS 53 03/06/2024 1116   BILITOT 0.4 03/06/2024 1116   GFRNONAA >60 03/06/2024 1116    ASSESSMENT and THERAPY PLAN:   MDS (myelodysplastic syndrome), high grade (HCC) Assessment and Plan Assessment & Plan Myelodysplastic syndrome with associated fatigue Managed with IDHIFA  maintaining blood counts. Fatigue persists but activity level remains high.  - Continue current medication regimen every other day. - Consider venetoclax if current treatment becomes ineffective, with close monitoring for tumor lysis syndrome if initiated.  Medication-induced diarrhea Diarrhea occurs predictably post-medication. Ondansetron  helps by causing constipation and addressing nausea. - Take ondansetron  as needed for diarrhea and nausea. - Consider taking Imodium the day after medication to prevent diarrhea.  Nausea managed with ondansetron  Nausea managed with ondansetron , effective particularly in the morning. - Continue ondansetron  as needed for nausea.  Dyspepsia and possible lactose intolerance Dyspepsia possibly related to lactose intolerance. Pantoprazole  available but not regularly used. Famotidine  suggested for as-needed use. - Avoid dairy products to assess for improvement in  dyspepsia. - Use famotidine  as needed for dyspepsia.    All questions were answered. The patient knows to call the clinic with any problems, questions or concerns. We can certainly see the patient much sooner if necessary.  Total encounter time:30 minutes*in face-to-face  visit time, chart review, lab review, care coordination, order entry, and documentation of the encounter time.    *Total Encounter Time as defined by the Centers for Medicare and Medicaid Services includes, in addition to the face-to-face time of a patient visit (documented in the note above) non-face-to-face time: obtaining and reviewing outside history, ordering and reviewing medications, tests or procedures, care coordination (communications with other health care professionals or caregivers) and documentation in the medical record.

## 2024-03-06 NOTE — Assessment & Plan Note (Signed)
 Assessment and Plan Assessment & Plan Myelodysplastic syndrome with associated fatigue Managed with IDHIFA  maintaining blood counts. Fatigue persists but activity level remains high.  - Continue current medication regimen every other day. - Consider venetoclax if current treatment becomes ineffective, with close monitoring for tumor lysis syndrome if initiated.  Medication-induced diarrhea Diarrhea occurs predictably post-medication. Ondansetron  helps by causing constipation and addressing nausea. - Take ondansetron  as needed for diarrhea and nausea. - Consider taking Imodium the day after medication to prevent diarrhea.  Nausea managed with ondansetron  Nausea managed with ondansetron , effective particularly in the morning. - Continue ondansetron  as needed for nausea.  Dyspepsia and possible lactose intolerance Dyspepsia possibly related to lactose intolerance. Pantoprazole  available but not regularly used. Famotidine  suggested for as-needed use. - Avoid dairy products to assess for improvement in dyspepsia. - Use famotidine  as needed for dyspepsia.

## 2024-03-09 ENCOUNTER — Other Ambulatory Visit: Payer: Self-pay

## 2024-03-09 NOTE — Progress Notes (Signed)
 Specialty Pharmacy Refill Coordination Note  [name] [name] is a 78 y.o. female contacted today regarding refills of specialty medication(s) Enasidenib Mesylate  (IDHIFA )   Patient requested Delivery   Delivery date: 03/12/24   Verified address: [address] [place] [address]   Medication will be filled on 09.24.25.

## 2024-03-10 ENCOUNTER — Other Ambulatory Visit: Payer: Self-pay

## 2024-03-12 ENCOUNTER — Other Ambulatory Visit (HOSPITAL_COMMUNITY): Payer: Self-pay

## 2024-03-25 ENCOUNTER — Encounter: Payer: Self-pay | Admitting: Hematology and Oncology

## 2024-04-02 ENCOUNTER — Telehealth: Payer: Self-pay

## 2024-04-02 NOTE — Telephone Encounter (Signed)
 Left message for patient about upcoming appointment on 10/17

## 2024-04-03 ENCOUNTER — Other Ambulatory Visit: Payer: Self-pay

## 2024-04-03 ENCOUNTER — Inpatient Hospital Stay (HOSPITAL_BASED_OUTPATIENT_CLINIC_OR_DEPARTMENT_OTHER): Admitting: Hematology and Oncology

## 2024-04-03 ENCOUNTER — Inpatient Hospital Stay: Attending: Hematology and Oncology

## 2024-04-03 ENCOUNTER — Inpatient Hospital Stay

## 2024-04-03 VITALS — BP 127/76 | HR 74 | Temp 97.6°F | Resp 20 | Ht 67.0 in | Wt 210.0 lb

## 2024-04-03 DIAGNOSIS — Z87891 Personal history of nicotine dependence: Secondary | ICD-10-CM | POA: Diagnosis not present

## 2024-04-03 DIAGNOSIS — D46Z Other myelodysplastic syndromes: Secondary | ICD-10-CM | POA: Diagnosis not present

## 2024-04-03 DIAGNOSIS — Z8261 Family history of arthritis: Secondary | ICD-10-CM | POA: Insufficient documentation

## 2024-04-03 DIAGNOSIS — J4540 Moderate persistent asthma, uncomplicated: Secondary | ICD-10-CM | POA: Diagnosis not present

## 2024-04-03 DIAGNOSIS — R197 Diarrhea, unspecified: Secondary | ICD-10-CM | POA: Diagnosis not present

## 2024-04-03 DIAGNOSIS — Z820 Family history of epilepsy and other diseases of the nervous system: Secondary | ICD-10-CM | POA: Diagnosis not present

## 2024-04-03 DIAGNOSIS — Z8719 Personal history of other diseases of the digestive system: Secondary | ICD-10-CM | POA: Insufficient documentation

## 2024-04-03 DIAGNOSIS — Z79899 Other long term (current) drug therapy: Secondary | ICD-10-CM | POA: Diagnosis not present

## 2024-04-03 DIAGNOSIS — E876 Hypokalemia: Secondary | ICD-10-CM | POA: Diagnosis not present

## 2024-04-03 DIAGNOSIS — Z91030 Bee allergy status: Secondary | ICD-10-CM | POA: Diagnosis not present

## 2024-04-03 DIAGNOSIS — K2970 Gastritis, unspecified, without bleeding: Secondary | ICD-10-CM | POA: Insufficient documentation

## 2024-04-03 DIAGNOSIS — L4050 Arthropathic psoriasis, unspecified: Secondary | ICD-10-CM | POA: Diagnosis not present

## 2024-04-03 DIAGNOSIS — R1310 Dysphagia, unspecified: Secondary | ICD-10-CM | POA: Diagnosis not present

## 2024-04-03 DIAGNOSIS — Z825 Family history of asthma and other chronic lower respiratory diseases: Secondary | ICD-10-CM | POA: Insufficient documentation

## 2024-04-03 DIAGNOSIS — R110 Nausea: Secondary | ICD-10-CM | POA: Insufficient documentation

## 2024-04-03 DIAGNOSIS — D61810 Antineoplastic chemotherapy induced pancytopenia: Secondary | ICD-10-CM | POA: Insufficient documentation

## 2024-04-03 DIAGNOSIS — Z840 Family history of diseases of the skin and subcutaneous tissue: Secondary | ICD-10-CM | POA: Insufficient documentation

## 2024-04-03 DIAGNOSIS — T451X5A Adverse effect of antineoplastic and immunosuppressive drugs, initial encounter: Secondary | ICD-10-CM | POA: Diagnosis not present

## 2024-04-03 DIAGNOSIS — Z9089 Acquired absence of other organs: Secondary | ICD-10-CM | POA: Diagnosis not present

## 2024-04-03 LAB — CBC WITH DIFFERENTIAL (CANCER CENTER ONLY)
Abs Immature Granulocytes: 0.02 K/uL (ref 0.00–0.07)
Basophils Absolute: 0.1 K/uL (ref 0.0–0.1)
Basophils Relative: 1 %
Eosinophils Absolute: 0 K/uL (ref 0.0–0.5)
Eosinophils Relative: 1 %
HCT: 37.7 % (ref 36.0–46.0)
Hemoglobin: 12.7 g/dL (ref 12.0–15.0)
Immature Granulocytes: 1 %
Lymphocytes Relative: 45 %
Lymphs Abs: 1.8 K/uL (ref 0.7–4.0)
MCH: 35.3 pg — ABNORMAL HIGH (ref 26.0–34.0)
MCHC: 33.7 g/dL (ref 30.0–36.0)
MCV: 104.7 fL — ABNORMAL HIGH (ref 80.0–100.0)
Monocytes Absolute: 0.6 K/uL (ref 0.1–1.0)
Monocytes Relative: 16 %
Neutro Abs: 1.4 K/uL — ABNORMAL LOW (ref 1.7–7.7)
Neutrophils Relative %: 36 %
Platelet Count: 183 K/uL (ref 150–400)
RBC: 3.6 MIL/uL — ABNORMAL LOW (ref 3.87–5.11)
RDW: 15.9 % — ABNORMAL HIGH (ref 11.5–15.5)
WBC Count: 3.9 K/uL — ABNORMAL LOW (ref 4.0–10.5)
nRBC: 0 % (ref 0.0–0.2)

## 2024-04-03 LAB — CMP (CANCER CENTER ONLY)
ALT: 13 U/L (ref 0–44)
AST: 17 U/L (ref 15–41)
Albumin: 3.9 g/dL (ref 3.5–5.0)
Alkaline Phosphatase: 50 U/L (ref 38–126)
Anion gap: 5 (ref 5–15)
BUN: 12 mg/dL (ref 8–23)
CO2: 28 mmol/L (ref 22–32)
Calcium: 9.3 mg/dL (ref 8.9–10.3)
Chloride: 105 mmol/L (ref 98–111)
Creatinine: 0.43 mg/dL — ABNORMAL LOW (ref 0.44–1.00)
GFR, Estimated: >60 mL/min (ref >60)
Glucose, Bld: 92 mg/dL (ref 70–99)
Potassium: 3.8 mmol/L (ref 3.5–5.1)
Sodium: 138 mmol/L (ref 135–145)
Total Bilirubin: 0.5 mg/dL (ref 0.0–1.2)
Total Protein: 6.8 g/dL (ref 6.5–8.1)

## 2024-04-03 MED ORDER — ENASIDENIB MESYLATE 50 MG PO TABS
50.0000 mg | ORAL_TABLET | ORAL | 1 refills | Status: AC
  Filled 2024-04-03: qty 30, 60d supply, fill #0
  Filled 2024-04-28 – 2024-07-07 (×6): qty 30, 30d supply, fill #0

## 2024-04-03 NOTE — Progress Notes (Signed)
 [facility] Cancer Follow up:    [name], [name] ORN, MD [address] [place] [address]   DIAGNOSIS: Myelodysplastic Syndrome  SUMMARY OF ONCOLOGIC HISTORY: Oncology History  MDS (myelodysplastic syndrome), high grade (HCC)  01/07/2023 Initial Biopsy   Bone marrow biopsy:cellular bone marrow with focal areas of hypercellularity (approximately up to  50%).  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  Blasts appear mildly mildly increased, approximately 5% to focally up to 10%.  Flow cytometric analysis reveals 5% CD34 positive blasts.  While the findings could be attributable to the patient's coexisting conditions, the possibility of a myeloid neoplasm such as myelodysplastic neoplasm with increased blasts cannot be entirely excluded. normal karyotype   01/29/2023 Initial Diagnosis   MDS (myelodysplastic syndrome), high grade (HCC)   02/04/2023 - 02/06/2023 Chemotherapy   Patient is on Treatment Plan : MYELODYSPLASIA  Azacitidine  IV D1-5 q28d     03/04/2023 - 07/29/2023 Chemotherapy   Patient is on Treatment Plan : MYELODYSPLASIA Decitabine  D1-5 q28d      Normal karyotype - 28, XX NGS - ASXL1, CBL, IDH2 and SRSF2 mutations.  PB CBC: WBC 2.4, 29% neutrophils - ANC 696, hgb 11.1, platelets 61  01/29/2023 Initial Diagnosis  MDS (myelodysplastic syndrome), high grade (CMD)   CURRENT THERAPY: Azacitidine   History of Present Illness  [name] [name] is a 78 year old female with psoriatic arthritis and MDS who presents for follow-up  She recently started a new infusion treatment for her psoriatic arthritis as her previous regimen was not effectively managing her symptoms, including joint pain and skin issues. The new treatment began last week, and it is too early to determine its effectiveness.  She is currently taking Idhifa  every two to three days, experiencing nausea and diarrhea, though these symptoms are not as severe as before. She manages nausea with  Zofran  and uses Imodium as needed for diarrhea, depending on her daily activities.  She has noticed swelling in her left thigh for the past couple of weeks. There is no associated pain, history of recent trauma, or insect bites. She had a hip replacement in 2011 but no recent procedures on her knee or thigh.  She has a history of gastritis and had an appointment with a gastroenterologist who has since moved away. She is not currently taking any medication for gastritis.  She reports a neurologic sensation in the back, possibly related to psoriatic arthritis.  Rest of the pertinent 10 point ROS reviewed and neg.  Patient Active Problem List   Diagnosis Date Noted   Dysphagia 04/28/2023   Pancytopenia due to chemotherapy 04/26/2023   Hypokalemia 04/26/2023   Neutropenic fever 04/25/2023   Hives 03/18/2023   MDS (myelodysplastic syndrome), high grade (HCC) 01/29/2023   Obesity 03/02/2021   Other long term (current) drug therapy 03/02/2021   Primary osteoarthritis 03/02/2021   SOB (shortness of breath) 12/08/2020   Pericardial effusion 12/08/2020   Dry eyes/dry mouth 05/25/2019   Bronchitis, mucopurulent recurrent (HCC) 02/26/2019   Moderate persistent asthma without complication 02/24/2019   Perennial and seasonal allergic rhinitis 02/24/2019   Seasonal allergic conjunctivitis 02/24/2019   Primary osteoarthritis of both knees 04/10/2016   Lung nodule 08/19/2014   HTN (hypertension) 08/07/2012   HLD (hyperlipidemia) 09/26/2011   Asthma 09/06/2010   Depression 09/06/2010   History of allergy 09/06/2010   Hypothyroidism 09/06/2010   Lower back pain 09/06/2010   Arthritis 09/06/2010   Pain in joint, pelvic region and thigh 09/06/2010   Peripheral  neuropathy 09/06/2010   Psoriasis 09/06/2010   Therapeutic drug monitoring 09/06/2010   Tinnitus 09/06/2010   Vitamin B 12 deficiency 09/06/2010   Cervical spondylosis without myelopathy 04/24/2006   Tear of lateral cartilage or  meniscus of knee, current 03/13/2006    is allergic to bee pollen, latex, pollen extract-tree extract [pollen extract], cat dander, cat hair extract, dust mite extract, and molds & smuts.  MEDICAL HISTORY: Past Medical History:  Diagnosis Date   Asthma    Diverticulitis    Hypothyroid    MDS (myelodysplastic syndrome) (HCC) 01/2023   Psoriatic arthritis (HCC)     SURGICAL HISTORY: Past Surgical History:  Procedure Laterality Date   COLONOSCOPY  08/2021   HIP SURGERY Bilateral    IR IMAGING GUIDED PORT INSERTION  02/20/2023   TONSILLECTOMY     TUBAL LIGATION      SOCIAL HISTORY: Social History   Socioeconomic History   Marital status: Widowed    Spouse name: Not on file   Number of children: Not on file   Years of education: Not on file   Highest education level: Not on file  Occupational History   Not on file  Tobacco Use   Smoking status: Former    Current packs/day: 0.00    Average packs/day: 0.1 packs/day for 33.4 years (3.3 ttl pk-yrs)    Types: Cigarettes    Start date: 21    Quit date: 11/18/2000    Years since quitting: 23.3   Smokeless tobacco: Never   Tobacco comments:    3 cigarettes a day  Vaping Use   Vaping status: Never Used  Substance and Sexual Activity   Alcohol use: Yes   Drug use: Never   Sexual activity: Not Currently  Other Topics Concern   Not on file  Social History Narrative   Lives with children   Social Drivers of Health   Financial Resource Strain: Not on file  Food Insecurity: No Food Insecurity (04/26/2023)   Hunger Vital Sign    Worried About Running Out of Food in the Last Year: Never true    Ran Out of Food in the Last Year: Never true  Transportation Needs: No Transportation Needs (04/26/2023)   PRAPARE - Administrator, Civil Service (Medical): No    Lack of Transportation (Non-Medical): No  Physical Activity: Not on file  Stress: Not on file  Social Connections: Not on file  Intimate Partner Violence:  Not At Risk (04/26/2023)   Humiliation, Afraid, Rape, and Kick questionnaire    Fear of Current or Ex-Partner: No    Emotionally Abused: No    Physically Abused: No    Sexually Abused: No    FAMILY HISTORY: Family History  Problem Relation Age of Onset   Asthma Mother    Allergic rhinitis Mother    Rheum arthritis Mother    Allergic rhinitis Father    Alzheimer's disease Father    Eczema Brother    Allergic rhinitis Brother    Colon cancer Neg Hx    Esophageal cancer Neg Hx    Stomach cancer Neg Hx    Rectal cancer Neg Hx     PHYSICAL EXAMINATION   Onc Performance Status - 04/03/24 1200       KPS SCALE   KPS % SCORE Able to carry on normal activity, minor s/s of disease           Vitals:   04/03/24 1203  BP: 127/76  Pulse: 74  Resp:  20  Temp: 97.6 F (36.4 C)  SpO2: 96%   Physical Exam Constitutional:      Appearance: Normal appearance.  Cardiovascular:     Rate and Rhythm: Normal rate and regular rhythm.  Pulmonary:     Effort: Pulmonary effort is normal.     Breath sounds: Normal breath sounds.  Abdominal:     General: Abdomen is flat.     Palpations: Abdomen is soft.  Musculoskeletal:        General: Normal range of motion.     Cervical back: Normal range of motion and neck supple. No rigidity.  Lymphadenopathy:     Cervical: No cervical adenopathy.  Skin:    General: Skin is warm and dry.  Neurological:     Mental Status: She is alert.  Psychiatric:        Mood and Affect: Mood normal.      LABORATORY DATA:  CBC    Component Value Date/Time   WBC 3.5 (L) 03/06/2024 1116   WBC 3.4 (L) 11/12/2023 0956   RBC 3.58 (L) 03/06/2024 1116   HGB 12.9 03/06/2024 1116   HCT 38.2 03/06/2024 1116   HCT 39.0 11/24/2022 1035   PLT 183 03/06/2024 1116   MCV 106.7 (H) 03/06/2024 1116   MCH 36.0 (H) 03/06/2024 1116   MCHC 33.8 03/06/2024 1116   RDW 15.4 03/06/2024 1116   LYMPHSABS 1.8 03/06/2024 1116   MONOABS 0.4 03/06/2024 1116   EOSABS 0.0  03/06/2024 1116   BASOSABS 0.0 03/06/2024 1116    CMP     Component Value Date/Time   NA 138 03/06/2024 1116   K 4.5 03/06/2024 1116   CL 104 03/06/2024 1116   CO2 29 03/06/2024 1116   GLUCOSE 88 03/06/2024 1116   BUN 12 03/06/2024 1116   CREATININE 0.46 03/06/2024 1116   CALCIUM 8.9 03/06/2024 1116   PROT 7.1 03/06/2024 1116   ALBUMIN 4.1 03/06/2024 1116   AST 16 03/06/2024 1116   ALT 12 03/06/2024 1116   ALKPHOS 53 03/06/2024 1116   BILITOT 0.4 03/06/2024 1116   GFRNONAA >60 03/06/2024 1116    ASSESSMENT and THERAPY PLAN:  Assessment and Plan Assessment & Plan Myelodysplastic syndrome Continued management with Idhifa . Blood counts pending. - Refill Idhifa  prescription.  Nausea and diarrhea secondary to Idhifa  Symptoms present but less severe. Managed with Imodium and Zofran .  Psoriatic arthritis Recent initiation of new infusion therapy. Skin lesions and joint pain persist. Too early to assess treatment effectiveness.  Left thigh swelling, rule out deep vein thrombosis Significant swelling without pain, trauma, or surgery. Differential includes deep vein thrombosis. - Order ultrasound of the left thigh to rule out deep vein thrombosis.  Gastritis Next appointment on December 5th. - Start Protonix  until gastroenterology appointment.    All questions were answered. The patient knows to call the clinic with any problems, questions or concerns. We can certainly see the patient much sooner if necessary.  Total encounter time:30 minutes*in face-to-face visit time, chart review, lab review, care coordination, order entry, and documentation of the encounter time.    *Total Encounter Time as defined by the Centers for Medicare and Medicaid Services includes, in addition to the face-to-face time of a patient visit (documented in the note above) non-face-to-face time: obtaining and reviewing outside history, ordering and reviewing medications, tests or procedures, care  coordination (communications with other health care professionals or caregivers) and documentation in the medical record.

## 2024-04-03 NOTE — Assessment & Plan Note (Addendum)
 Assessment and Plan Assessment & Plan Myelodysplastic syndrome Continued management with Idhifa .  CBC satisfactory, - Refill Idhifa  prescription.  Nausea and diarrhea secondary to Idhifa  Symptoms present but less severe. Managed with Imodium and Zofran .  Psoriatic arthritis Recent initiation of new infusion therapy. Skin lesions and joint pain persist. Too early to assess treatment effectiveness.  Left thigh swelling, rule out deep vein thrombosis Significant swelling without pain, trauma, or surgery. Differential includes deep vein thrombosis. Less concern for DVT - Order ultrasound of the left thigh to rule out deep vein thrombosis.  Gastritis Next appointment on December 5th. - Start Protonix  until gastroenterology appointment.

## 2024-04-04 ENCOUNTER — Encounter (INDEPENDENT_AMBULATORY_CARE_PROVIDER_SITE_OTHER): Payer: Self-pay

## 2024-04-06 ENCOUNTER — Other Ambulatory Visit: Payer: Self-pay

## 2024-04-06 ENCOUNTER — Ambulatory Visit (HOSPITAL_COMMUNITY)
Admission: RE | Admit: 2024-04-06 | Discharge: 2024-04-06 | Disposition: A | Discharge status: Home / Self Care | Source: Ambulatory Visit | Attending: Hematology and Oncology | Admitting: Hematology and Oncology

## 2024-04-06 ENCOUNTER — Other Ambulatory Visit: Payer: Self-pay | Admitting: Hematology and Oncology

## 2024-04-06 DIAGNOSIS — D46Z Other myelodysplastic syndromes: Secondary | ICD-10-CM | POA: Insufficient documentation

## 2024-04-06 NOTE — Progress Notes (Signed)
 Specialty Pharmacy Ongoing Clinical Assessment Note  [name] [name] is a 78 y.o. female who is being followed by the specialty pharmacy service for RxSp Oncology   Patient's specialty medication(s) reviewed today: Enasidenib Mesylate  (IDHIFA )   Missed doses in the last 4 weeks: 0   Patient/Caregiver did not have any additional questions or concerns.   Therapeutic benefit summary: Patient is achieving benefit   Adverse events/side effects summary: Experienced adverse events/side effects (diarrhea - improved with taking medication every 2 to 3 days.)   Patient's therapy is appropriate to: Continue    Goals Addressed             This Visit's Progress    Maintain optimal adherence to therapy   On track    Patient is on track. Patient will maintain adherence          Follow up: 3 months  [facility] Specialty Pharmacist

## 2024-04-06 NOTE — Telephone Encounter (Signed)
 Refilled Zofran  per last office note

## 2024-04-06 NOTE — Telephone Encounter (Signed)
 Duplicate request

## 2024-04-28 ENCOUNTER — Other Ambulatory Visit: Payer: Self-pay

## 2024-04-30 ENCOUNTER — Other Ambulatory Visit: Payer: Self-pay

## 2024-05-04 ENCOUNTER — Other Ambulatory Visit (HOSPITAL_COMMUNITY): Payer: Self-pay

## 2024-05-05 ENCOUNTER — Inpatient Hospital Stay: Attending: Hematology and Oncology

## 2024-05-05 ENCOUNTER — Inpatient Hospital Stay: Admitting: Hematology and Oncology

## 2024-05-05 VITALS — BP 131/66 | HR 77 | Temp 97.6°F | Resp 18 | Wt 212.2 lb

## 2024-05-05 DIAGNOSIS — E039 Hypothyroidism, unspecified: Secondary | ICD-10-CM | POA: Diagnosis not present

## 2024-05-05 DIAGNOSIS — G4710 Hypersomnia, unspecified: Secondary | ICD-10-CM | POA: Insufficient documentation

## 2024-05-05 DIAGNOSIS — Z9089 Acquired absence of other organs: Secondary | ICD-10-CM | POA: Insufficient documentation

## 2024-05-05 DIAGNOSIS — Z79899 Other long term (current) drug therapy: Secondary | ICD-10-CM | POA: Diagnosis not present

## 2024-05-05 DIAGNOSIS — Z91030 Bee allergy status: Secondary | ICD-10-CM | POA: Insufficient documentation

## 2024-05-05 DIAGNOSIS — D46Z Other myelodysplastic syndromes: Secondary | ICD-10-CM | POA: Diagnosis present

## 2024-05-05 DIAGNOSIS — Z87891 Personal history of nicotine dependence: Secondary | ICD-10-CM | POA: Diagnosis not present

## 2024-05-05 DIAGNOSIS — Z840 Family history of diseases of the skin and subcutaneous tissue: Secondary | ICD-10-CM | POA: Diagnosis not present

## 2024-05-05 DIAGNOSIS — Z820 Family history of epilepsy and other diseases of the nervous system: Secondary | ICD-10-CM | POA: Insufficient documentation

## 2024-05-05 DIAGNOSIS — Z91040 Latex allergy status: Secondary | ICD-10-CM | POA: Diagnosis not present

## 2024-05-05 DIAGNOSIS — J4540 Moderate persistent asthma, uncomplicated: Secondary | ICD-10-CM | POA: Insufficient documentation

## 2024-05-05 DIAGNOSIS — R5383 Other fatigue: Secondary | ICD-10-CM | POA: Insufficient documentation

## 2024-05-05 DIAGNOSIS — Z825 Family history of asthma and other chronic lower respiratory diseases: Secondary | ICD-10-CM | POA: Diagnosis not present

## 2024-05-05 DIAGNOSIS — Z8261 Family history of arthritis: Secondary | ICD-10-CM | POA: Diagnosis not present

## 2024-05-05 DIAGNOSIS — M79652 Pain in left thigh: Secondary | ICD-10-CM | POA: Diagnosis not present

## 2024-05-05 DIAGNOSIS — L4050 Arthropathic psoriasis, unspecified: Secondary | ICD-10-CM | POA: Diagnosis not present

## 2024-05-05 DIAGNOSIS — R110 Nausea: Secondary | ICD-10-CM | POA: Diagnosis not present

## 2024-05-05 LAB — CBC WITH DIFFERENTIAL (CANCER CENTER ONLY)
Abs Immature Granulocytes: 0.01 K/uL (ref 0.00–0.07)
Basophils Absolute: 0.1 K/uL (ref 0.0–0.1)
Basophils Relative: 1 %
Eosinophils Absolute: 0 K/uL (ref 0.0–0.5)
Eosinophils Relative: 1 %
HCT: 39.1 % (ref 36.0–46.0)
Hemoglobin: 13 g/dL (ref 12.0–15.0)
Immature Granulocytes: 0 %
Lymphocytes Relative: 42 %
Lymphs Abs: 1.7 K/uL (ref 0.7–4.0)
MCH: 34.6 pg — ABNORMAL HIGH (ref 26.0–34.0)
MCHC: 33.2 g/dL (ref 30.0–36.0)
MCV: 104 fL — ABNORMAL HIGH (ref 80.0–100.0)
Monocytes Absolute: 0.6 K/uL (ref 0.1–1.0)
Monocytes Relative: 15 %
Neutro Abs: 1.7 K/uL (ref 1.7–7.7)
Neutrophils Relative %: 41 %
Platelet Count: 178 K/uL (ref 150–400)
RBC: 3.76 MIL/uL — ABNORMAL LOW (ref 3.87–5.11)
RDW: 16 % — ABNORMAL HIGH (ref 11.5–15.5)
WBC Count: 4 K/uL (ref 4.0–10.5)
nRBC: 0 % (ref 0.0–0.2)

## 2024-05-05 LAB — CMP (CANCER CENTER ONLY)
ALT: 12 U/L (ref 0–44)
AST: 23 U/L (ref 15–41)
Albumin: 4.1 g/dL (ref 3.5–5.0)
Alkaline Phosphatase: 62 U/L (ref 38–126)
Anion gap: 9 (ref 5–15)
BUN: 11 mg/dL (ref 8–23)
CO2: 27 mmol/L (ref 22–32)
Calcium: 9.3 mg/dL (ref 8.9–10.3)
Chloride: 104 mmol/L (ref 98–111)
Creatinine: 0.52 mg/dL (ref 0.44–1.00)
GFR, Estimated: >60 mL/min (ref >60)
Glucose, Bld: 95 mg/dL (ref 70–99)
Potassium: 4.1 mmol/L (ref 3.5–5.1)
Sodium: 140 mmol/L (ref 135–145)
Total Bilirubin: 0.4 mg/dL (ref 0.0–1.2)
Total Protein: 6.7 g/dL (ref 6.5–8.1)

## 2024-05-05 NOTE — Progress Notes (Signed)
 [facility] Cancer Follow up:    [name], [name], MD [address] [place] [address]   DIAGNOSIS: Myelodysplastic Syndrome  SUMMARY OF ONCOLOGIC HISTORY: Oncology History  MDS (myelodysplastic syndrome), high grade (HCC)  01/07/2023 Initial Biopsy   Bone marrow biopsy:cellular bone marrow with focal areas of hypercellularity (approximately up to  50%).  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  Blasts appear mildly mildly increased, approximately 5% to focally up to 10%.  Flow cytometric analysis reveals 5% CD34 positive blasts.  While the findings could be attributable to the patient's coexisting conditions, the possibility of a myeloid neoplasm such as myelodysplastic neoplasm with increased blasts cannot be entirely excluded. normal karyotype   01/29/2023 Initial Diagnosis   MDS (myelodysplastic syndrome), high grade (HCC)   02/04/2023 - 02/06/2023 Chemotherapy   Patient is on Treatment Plan : MYELODYSPLASIA  Azacitidine  IV D1-5 q28d     03/04/2023 - 07/29/2023 Chemotherapy   Patient is on Treatment Plan : MYELODYSPLASIA Decitabine  D1-5 q28d      Normal karyotype - 97, XX NGS - ASXL1, CBL, IDH2 and SRSF2 mutations.  PB CBC: WBC 2.4, 29% neutrophils - ANC 696, hgb 11.1, platelets 61  01/29/2023 Initial Diagnosis  MDS (myelodysplastic syndrome), high grade (CMD)   CURRENT THERAPY: Azacitidine   History of Present Illness  [name] [name] is a 78 year old female with psoriatic arthritis and MDS who presents for follow-up  [name] [name] is a 78 year old female with psoriatic arthritis who presents for follow-up regarding her treatment and symptoms.  She experiences significant fatigue, describing it as the ability to 'fall asleep anywhere,' which is unusual for her.   She is undergoing treatment for psoriatic arthritis and recently started a new infusion therapy, which she has received twice. She notes a slight improvement in her symptoms,  although she feels the treatment is still 'building up.'  She experiences stomach pains and has gained approximately fifteen pounds since starting her current treatment regimen Idhifa .. She has adjusted her diet by reducing bread intake, now consuming only half of an English muffin, which she previously used to help with nausea.  She has a history of a swollen left leg, previously evaluated with an ultrasound that showed no blood clots. The swelling persists and is described as 'twice as big' as her other leg, though it does not cause pain.  Rest of the pertinent 10 point ROS reviewed and neg.  Patient Active Problem List   Diagnosis Date Noted   Dysphagia 04/28/2023   Pancytopenia due to chemotherapy 04/26/2023   Hypokalemia 04/26/2023   Neutropenic fever 04/25/2023   Hives 03/18/2023   MDS (myelodysplastic syndrome), high grade (HCC) 01/29/2023   Obesity 03/02/2021   Other long term (current) drug therapy 03/02/2021   Primary osteoarthritis 03/02/2021   SOB (shortness of breath) 12/08/2020   Pericardial effusion 12/08/2020   Dry eyes/dry mouth 05/25/2019   Bronchitis, mucopurulent recurrent (HCC) 02/26/2019   Moderate persistent asthma without complication 02/24/2019   Perennial and seasonal allergic rhinitis 02/24/2019   Seasonal allergic conjunctivitis 02/24/2019   Primary osteoarthritis of both knees 04/10/2016   Lung nodule 08/19/2014   HTN (hypertension) 08/07/2012   HLD (hyperlipidemia) 09/26/2011   Asthma 09/06/2010   Depression 09/06/2010   History of allergy 09/06/2010   Hypothyroidism 09/06/2010   Lower back pain 09/06/2010   Arthritis 09/06/2010   Pain in joint, pelvic region and thigh 09/06/2010   Peripheral neuropathy 09/06/2010   Psoriasis 09/06/2010  Therapeutic drug monitoring 09/06/2010   Tinnitus 09/06/2010   Vitamin B 12 deficiency 09/06/2010   Cervical spondylosis without myelopathy 04/24/2006   Tear of lateral cartilage or meniscus of knee, current  03/13/2006    is allergic to bee pollen, latex, pollen extract-tree extract [pollen extract], cat dander, cat hair extract, dust mite extract, and molds & smuts.  MEDICAL HISTORY: Past Medical History:  Diagnosis Date   Asthma    Diverticulitis    Hypothyroid    MDS (myelodysplastic syndrome) (HCC) 01/2023   Psoriatic arthritis (HCC)     SURGICAL HISTORY: Past Surgical History:  Procedure Laterality Date   COLONOSCOPY  08/2021   HIP SURGERY Bilateral    IR IMAGING GUIDED PORT INSERTION  02/20/2023   TONSILLECTOMY     TUBAL LIGATION      SOCIAL HISTORY: Social History   Socioeconomic History   Marital status: Widowed    Spouse name: Not on file   Number of children: Not on file   Years of education: Not on file   Highest education level: Not on file  Occupational History   Not on file  Tobacco Use   Smoking status: Former    Current packs/day: 0.00    Average packs/day: 0.1 packs/day for 33.4 years (3.3 ttl pk-yrs)    Types: Cigarettes    Start date: 58    Quit date: 11/18/2000    Years since quitting: 23.4   Smokeless tobacco: Never   Tobacco comments:    3 cigarettes a day  Vaping Use   Vaping status: Never Used  Substance and Sexual Activity   Alcohol use: Yes   Drug use: Never   Sexual activity: Not Currently  Other Topics Concern   Not on file  Social History Narrative   Lives with children   Social Drivers of Health   Financial Resource Strain: Not on file  Food Insecurity: No Food Insecurity (04/26/2023)   Hunger Vital Sign    Worried About Running Out of Food in the Last Year: Never true    Ran Out of Food in the Last Year: Never true  Transportation Needs: No Transportation Needs (04/26/2023)   PRAPARE - Administrator, Civil Service (Medical): No    Lack of Transportation (Non-Medical): No  Physical Activity: Not on file  Stress: Not on file  Social Connections: Not on file  Intimate Partner Violence: Not At Risk (04/26/2023)    Humiliation, Afraid, Rape, and Kick questionnaire    Fear of Current or Ex-Partner: No    Emotionally Abused: No    Physically Abused: No    Sexually Abused: No    FAMILY HISTORY: Family History  Problem Relation Age of Onset   Asthma Mother    Allergic rhinitis Mother    Rheum arthritis Mother    Allergic rhinitis Father    Alzheimer's disease Father    Eczema Brother    Allergic rhinitis Brother    Colon cancer Neg Hx    Esophageal cancer Neg Hx    Stomach cancer Neg Hx    Rectal cancer Neg Hx     PHYSICAL EXAMINATION      Vitals:   05/05/24 1153  BP: 131/66  Pulse: 77  Resp: 18  Temp: 97.6 F (36.4 C)  SpO2: 95%   Physical Exam Constitutional:      Appearance: Normal appearance.  Cardiovascular:     Rate and Rhythm: Normal rate and regular rhythm.  Pulmonary:     Effort:  Pulmonary effort is normal.     Breath sounds: Normal breath sounds.  Abdominal:     General: Abdomen is flat.     Palpations: Abdomen is soft.  Musculoskeletal:        General: Normal range of motion.     Cervical back: Normal range of motion and neck supple. No rigidity.     Comments: Left thigh swollen compared to the right thigh  Lymphadenopathy:     Cervical: No cervical adenopathy.  Skin:    General: Skin is warm and dry.  Neurological:     Mental Status: She is alert.  Psychiatric:        Mood and Affect: Mood normal.      LABORATORY DATA:  CBC    Component Value Date/Time   WBC 4.0 05/05/2024 1120   WBC 3.4 (L) 11/12/2023 0956   RBC 3.76 (L) 05/05/2024 1120   HGB 13.0 05/05/2024 1120   HCT 39.1 05/05/2024 1120   HCT 39.0 11/24/2022 1035   PLT 178 05/05/2024 1120   MCV 104.0 (H) 05/05/2024 1120   MCH 34.6 (H) 05/05/2024 1120   MCHC 33.2 05/05/2024 1120   RDW 16.0 (H) 05/05/2024 1120   LYMPHSABS 1.7 05/05/2024 1120   MONOABS 0.6 05/05/2024 1120   EOSABS 0.0 05/05/2024 1120   BASOSABS 0.1 05/05/2024 1120    CMP     Component Value Date/Time   NA 138  04/03/2024 1127   K 3.8 04/03/2024 1127   CL 105 04/03/2024 1127   CO2 28 04/03/2024 1127   GLUCOSE 92 04/03/2024 1127   BUN 12 04/03/2024 1127   CREATININE 0.43 (L) 04/03/2024 1127   CALCIUM 9.3 04/03/2024 1127   PROT 6.8 04/03/2024 1127   ALBUMIN 3.9 04/03/2024 1127   AST 17 04/03/2024 1127   ALT 13 04/03/2024 1127   ALKPHOS 50 04/03/2024 1127   BILITOT 0.5 04/03/2024 1127   GFRNONAA >60 04/03/2024 1127    ASSESSMENT and THERAPY PLAN:  Assessment and Plan Assessment & Plan Myelodysplastic syndrome Continued management with Idhifa . Blood counts today reviewed and normal.  Psoriatic arthritis under biologic therapy Some improvement noted after two infusions, but condition persists.  Unilateral lower extremity swelling, evaluation ongoing Persistent swelling without pain. Previous ultrasound negative for clots. Differential includes muscle mass or soft tissue abnormalities. - Ordered MRI of the left femur.  Fatigue and excessive daytime sleepiness Reports fatigue and excessive sleepiness related to IDHIFA  She is only taking it once a week now, she says she is not able to tolerate it more often than that If she continues to tolerate it poorly, we can consider venetoclax or azacytidine again.  Nausea Intermittent nausea, possibly medication-related. Diet adjusted to manage symptoms.  RTC in 4 weeks with labs.   All questions were answered. The patient knows to call the clinic with any problems, questions or concerns. We can certainly see the patient much sooner if necessary.  Total encounter time:30 minutes*in face-to-face visit time, chart review, lab review, care coordination, order entry, and documentation of the encounter time.    *Total Encounter Time as defined by the Centers for Medicare and Medicaid Services includes, in addition to the face-to-face time of a patient visit (documented in the note above) non-face-to-face time: obtaining and reviewing outside  history, ordering and reviewing medications, tests or procedures, care coordination (communications with other health care professionals or caregivers) and documentation in the medical record.

## 2024-05-12 ENCOUNTER — Ambulatory Visit (HOSPITAL_COMMUNITY)
Admission: RE | Admit: 2024-05-12 | Discharge: 2024-05-12 | Disposition: A | Discharge status: Home / Self Care | Source: Ambulatory Visit | Attending: Hematology and Oncology | Admitting: Hematology and Oncology

## 2024-05-12 DIAGNOSIS — M79652 Pain in left thigh: Secondary | ICD-10-CM | POA: Insufficient documentation

## 2024-05-12 MED ORDER — GADOBUTROL 1 MMOL/ML IV SOLN
10.0000 mL | Freq: Once | INTRAVENOUS | Status: AC | PRN
  Administered 2024-05-12: 10 mL via INTRAVENOUS

## 2024-05-20 ENCOUNTER — Encounter: Payer: Self-pay | Admitting: Hematology and Oncology

## 2024-05-20 ENCOUNTER — Ambulatory Visit: Admitting: Gastroenterology

## 2024-05-21 ENCOUNTER — Other Ambulatory Visit: Payer: Self-pay

## 2024-05-21 ENCOUNTER — Other Ambulatory Visit (HOSPITAL_COMMUNITY): Payer: Self-pay

## 2024-05-25 ENCOUNTER — Other Ambulatory Visit: Payer: Self-pay

## 2024-05-25 ENCOUNTER — Other Ambulatory Visit (HOSPITAL_COMMUNITY): Payer: Self-pay

## 2024-05-26 ENCOUNTER — Other Ambulatory Visit (HOSPITAL_COMMUNITY): Payer: Self-pay

## 2024-06-02 ENCOUNTER — Inpatient Hospital Stay

## 2024-06-02 ENCOUNTER — Inpatient Hospital Stay: Attending: Hematology and Oncology | Admitting: Hematology and Oncology

## 2024-06-02 VITALS — BP 139/75 | HR 81 | Temp 97.7°F | Resp 16 | Wt 207.6 lb

## 2024-06-02 DIAGNOSIS — Z9851 Tubal ligation status: Secondary | ICD-10-CM | POA: Diagnosis not present

## 2024-06-02 DIAGNOSIS — Z9089 Acquired absence of other organs: Secondary | ICD-10-CM | POA: Insufficient documentation

## 2024-06-02 DIAGNOSIS — G4710 Hypersomnia, unspecified: Secondary | ICD-10-CM | POA: Diagnosis not present

## 2024-06-02 DIAGNOSIS — R197 Diarrhea, unspecified: Secondary | ICD-10-CM | POA: Diagnosis not present

## 2024-06-02 DIAGNOSIS — Z91030 Bee allergy status: Secondary | ICD-10-CM | POA: Diagnosis not present

## 2024-06-02 DIAGNOSIS — W010XXA Fall on same level from slipping, tripping and stumbling without subsequent striking against object, initial encounter: Secondary | ICD-10-CM | POA: Insufficient documentation

## 2024-06-02 DIAGNOSIS — R110 Nausea: Secondary | ICD-10-CM | POA: Diagnosis not present

## 2024-06-02 DIAGNOSIS — R634 Abnormal weight loss: Secondary | ICD-10-CM | POA: Insufficient documentation

## 2024-06-02 DIAGNOSIS — Z91040 Latex allergy status: Secondary | ICD-10-CM | POA: Diagnosis not present

## 2024-06-02 DIAGNOSIS — Z825 Family history of asthma and other chronic lower respiratory diseases: Secondary | ICD-10-CM | POA: Diagnosis not present

## 2024-06-02 DIAGNOSIS — G4700 Insomnia, unspecified: Secondary | ICD-10-CM | POA: Insufficient documentation

## 2024-06-02 DIAGNOSIS — Z79899 Other long term (current) drug therapy: Secondary | ICD-10-CM | POA: Diagnosis not present

## 2024-06-02 DIAGNOSIS — D46Z Other myelodysplastic syndromes: Secondary | ICD-10-CM

## 2024-06-02 DIAGNOSIS — Y92009 Unspecified place in unspecified non-institutional (private) residence as the place of occurrence of the external cause: Secondary | ICD-10-CM | POA: Diagnosis not present

## 2024-06-02 DIAGNOSIS — Z820 Family history of epilepsy and other diseases of the nervous system: Secondary | ICD-10-CM | POA: Insufficient documentation

## 2024-06-02 DIAGNOSIS — Z8261 Family history of arthritis: Secondary | ICD-10-CM | POA: Diagnosis not present

## 2024-06-02 DIAGNOSIS — Z87891 Personal history of nicotine dependence: Secondary | ICD-10-CM | POA: Insufficient documentation

## 2024-06-02 LAB — CMP (CANCER CENTER ONLY)
ALT: 17 U/L (ref 0–44)
AST: 23 U/L (ref 15–41)
Albumin: 4.4 g/dL (ref 3.5–5.0)
Alkaline Phosphatase: 68 U/L (ref 38–126)
Anion gap: 10 (ref 5–15)
BUN: 17 mg/dL (ref 8–23)
CO2: 25 mmol/L (ref 22–32)
Calcium: 9.1 mg/dL (ref 8.9–10.3)
Chloride: 103 mmol/L (ref 98–111)
Creatinine: 0.54 mg/dL (ref 0.44–1.00)
GFR, Estimated: >60 mL/min (ref >60)
Glucose, Bld: 88 mg/dL (ref 70–99)
Potassium: 4.2 mmol/L (ref 3.5–5.1)
Sodium: 138 mmol/L (ref 135–145)
Total Bilirubin: 0.4 mg/dL (ref 0.0–1.2)
Total Protein: 7.7 g/dL (ref 6.5–8.1)

## 2024-06-02 LAB — CBC WITH DIFFERENTIAL (CANCER CENTER ONLY)
Abs Immature Granulocytes: 0.04 K/uL (ref 0.00–0.07)
Basophils Absolute: 0.1 K/uL (ref 0.0–0.1)
Basophils Relative: 1 %
Eosinophils Absolute: 0 K/uL (ref 0.0–0.5)
Eosinophils Relative: 0 %
HCT: 40.1 % (ref 36.0–46.0)
Hemoglobin: 13.5 g/dL (ref 12.0–15.0)
Immature Granulocytes: 1 %
Lymphocytes Relative: 42 %
Lymphs Abs: 2 K/uL (ref 0.7–4.0)
MCH: 34.3 pg — ABNORMAL HIGH (ref 26.0–34.0)
MCHC: 33.7 g/dL (ref 30.0–36.0)
MCV: 101.8 fL — ABNORMAL HIGH (ref 80.0–100.0)
Monocytes Absolute: 0.9 K/uL (ref 0.1–1.0)
Monocytes Relative: 18 %
Neutro Abs: 1.8 K/uL (ref 1.7–7.7)
Neutrophils Relative %: 38 %
Platelet Count: 196 K/uL (ref 150–400)
RBC: 3.94 MIL/uL (ref 3.87–5.11)
RDW: 15.9 % — ABNORMAL HIGH (ref 11.5–15.5)
WBC Count: 4.8 K/uL (ref 4.0–10.5)
nRBC: 0 % (ref 0.0–0.2)

## 2024-06-02 NOTE — Progress Notes (Signed)
 [facility] Cancer Follow up:    [name], [name] ORN, MD [address] [place] [address]   DIAGNOSIS: Myelodysplastic Syndrome  SUMMARY OF ONCOLOGIC HISTORY: Oncology History  MDS (myelodysplastic syndrome), high grade (HCC)  01/07/2023 Initial Biopsy   Bone marrow biopsy:cellular bone marrow with focal areas of hypercellularity (approximately up to  50%).  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  Blasts appear mildly mildly increased, approximately 5% to focally up to 10%.  Flow cytometric analysis reveals 5% CD34 positive blasts.  While the findings could be attributable to the patient's coexisting conditions, the possibility of a myeloid neoplasm such as myelodysplastic neoplasm with increased blasts cannot be entirely excluded. normal karyotype   01/29/2023 Initial Diagnosis   MDS (myelodysplastic syndrome), high grade (HCC)   02/04/2023 - 02/06/2023 Chemotherapy   Patient is on Treatment Plan : MYELODYSPLASIA  Azacitidine  IV D1-5 q28d     03/04/2023 - 07/29/2023 Chemotherapy   Patient is on Treatment Plan : MYELODYSPLASIA Decitabine  D1-5 q28d      Normal karyotype - 85, XX NGS - ASXL1, CBL, IDH2 and SRSF2 mutations.  PB CBC: WBC 2.4, 29% neutrophils - ANC 696, hgb 11.1, platelets 61  01/29/2023 Initial Diagnosis  MDS (myelodysplastic syndrome), high grade (CMD)   CURRENT THERAPY: Azacitidine   History of Present Illness [name] [name] is a 78 year old female with MDS on Idhifa  who presents for routine oncology follow-up and evaluation of disease status.  She has been taking it once a week at this point. She says despite dose reductions, her stomach tears up, nausea and diarrhea are hard to keep up with and she is working on some celebrations during the holidays.  She experienced a fall at home on Friday after tripping on a rug while wearing slippers, resulting in no major injury but significant emotional distress. She has since implemented fall  prevention strategies, including wearing shoes before ambulation and considering additional home safety measures.  She reports a recent weight loss of several pounds, which she views positively. She denies gastrointestinal symptoms and states her bowel movements are regular. She remains physically active and socially engaged. She endorses insomnia, attributing it to psychosocial stress related to her daughter's college admissions process.  Rest of the pertinent 10 point ROS reviewed and neg.  Patient Active Problem List   Diagnosis Date Noted   Dysphagia 04/28/2023   Pancytopenia due to chemotherapy 04/26/2023   Hypokalemia 04/26/2023   Neutropenic fever 04/25/2023   Hives 03/18/2023   MDS (myelodysplastic syndrome), high grade (HCC) 01/29/2023   Obesity 03/02/2021   Other long term (current) drug therapy 03/02/2021   Primary osteoarthritis 03/02/2021   SOB (shortness of breath) 12/08/2020   Pericardial effusion 12/08/2020   Dry eyes/dry mouth 05/25/2019   Bronchitis, mucopurulent recurrent (HCC) 02/26/2019   Moderate persistent asthma without complication 02/24/2019   Perennial and seasonal allergic rhinitis 02/24/2019   Seasonal allergic conjunctivitis 02/24/2019   Primary osteoarthritis of both knees 04/10/2016   Lung nodule 08/19/2014   HTN (hypertension) 08/07/2012   HLD (hyperlipidemia) 09/26/2011   Asthma 09/06/2010   Depression 09/06/2010   History of allergy 09/06/2010   Hypothyroidism 09/06/2010   Lower back pain 09/06/2010   Arthritis 09/06/2010   Pain in joint, pelvic region and thigh 09/06/2010   Peripheral neuropathy 09/06/2010   Psoriasis 09/06/2010   Therapeutic drug monitoring 09/06/2010   Tinnitus 09/06/2010   Vitamin B 12 deficiency 09/06/2010   Cervical spondylosis without myelopathy 04/24/2006   Tear of lateral  cartilage or meniscus of knee, current 03/13/2006    is allergic to bee pollen, latex, pollen extract-tree extract [pollen extract], cat  dander, cat hair extract, dust mite extract, and molds & smuts.  MEDICAL HISTORY: Past Medical History:  Diagnosis Date   Asthma    Diverticulitis    Hypothyroid    MDS (myelodysplastic syndrome) (HCC) 01/2023   Psoriatic arthritis (HCC)     SURGICAL HISTORY: Past Surgical History:  Procedure Laterality Date   COLONOSCOPY  08/2021   HIP SURGERY Bilateral    IR IMAGING GUIDED PORT INSERTION  02/20/2023   TONSILLECTOMY     TUBAL LIGATION      SOCIAL HISTORY: Social History   Socioeconomic History   Marital status: Widowed    Spouse name: Not on file   Number of children: Not on file   Years of education: Not on file   Highest education level: Not on file  Occupational History   Not on file  Tobacco Use   Smoking status: Former    Current packs/day: 0.00    Average packs/day: 0.1 packs/day for 33.4 years (3.3 ttl pk-yrs)    Types: Cigarettes    Start date: 13    Quit date: 11/18/2000    Years since quitting: 23.5   Smokeless tobacco: Never   Tobacco comments:    3 cigarettes a day  Vaping Use   Vaping status: Never Used  Substance and Sexual Activity   Alcohol use: Yes   Drug use: Never   Sexual activity: Not Currently  Other Topics Concern   Not on file  Social History Narrative   Lives with children   Social Drivers of Health   Tobacco Use: Medium Risk (01/28/2024)   Patient History    Smoking Tobacco Use: Former    Smokeless Tobacco Use: Never    Passive Exposure: Not on Actuary Strain: Not on file  Food Insecurity: No Food Insecurity (04/26/2023)   Hunger Vital Sign    Worried About Running Out of Food in the Last Year: Never true    Ran Out of Food in the Last Year: Never true  Transportation Needs: No Transportation Needs (04/26/2023)   PRAPARE - Administrator, Civil Service (Medical): No    Lack of Transportation (Non-Medical): No  Physical Activity: Not on file  Stress: Not on file  Social Connections: Not on file   Intimate Partner Violence: Not At Risk (04/26/2023)   Humiliation, Afraid, Rape, and Kick questionnaire    Fear of Current or Ex-Partner: No    Emotionally Abused: No    Physically Abused: No    Sexually Abused: No  Depression (PHQ2-9): Low Risk (11/24/2022)   Depression (PHQ2-9)    PHQ-2 Score: 0  Alcohol Screen: Not on file  Housing: Low Risk (04/26/2023)   Housing    Last Housing Risk Score: 0  Utilities: Not At Risk (04/26/2023)   AHC Utilities    Threatened with loss of utilities: No  Health Literacy: Not on file    FAMILY HISTORY: Family History  Problem Relation Age of Onset   Asthma Mother    Allergic rhinitis Mother    Rheum arthritis Mother    Allergic rhinitis Father    Alzheimer's disease Father    Eczema Brother    Allergic rhinitis Brother    Colon cancer Neg Hx    Esophageal cancer Neg Hx    Stomach cancer Neg Hx    Rectal cancer Neg Hx  PHYSICAL EXAMINATION      Vitals:   06/02/24 1108  BP: 139/75  Pulse: 81  Resp: 16  Temp: 97.7 F (36.5 C)  SpO2: 97%   Physical Exam Constitutional:      Appearance: Normal appearance.  Cardiovascular:     Rate and Rhythm: Normal rate and regular rhythm.  Pulmonary:     Effort: Pulmonary effort is normal.     Breath sounds: Normal breath sounds.  Abdominal:     General: Abdomen is flat.     Palpations: Abdomen is soft.  Musculoskeletal:        General: Normal range of motion.     Cervical back: Normal range of motion and neck supple. No rigidity.  Lymphadenopathy:     Cervical: No cervical adenopathy.  Skin:    General: Skin is warm and dry.  Neurological:     Mental Status: She is alert.  Psychiatric:        Mood and Affect: Mood normal.      LABORATORY DATA:  CBC    Component Value Date/Time   WBC 4.8 06/02/2024 1041   WBC 3.4 (L) 11/12/2023 0956   RBC 3.94 06/02/2024 1041   HGB 13.5 06/02/2024 1041   HCT 40.1 06/02/2024 1041   HCT 39.0 11/24/2022 1035   PLT 196 06/02/2024 1041    MCV 101.8 (H) 06/02/2024 1041   MCH 34.3 (H) 06/02/2024 1041   MCHC 33.7 06/02/2024 1041   RDW 15.9 (H) 06/02/2024 1041   LYMPHSABS 2.0 06/02/2024 1041   MONOABS 0.9 06/02/2024 1041   EOSABS 0.0 06/02/2024 1041   BASOSABS 0.1 06/02/2024 1041    CMP     Component Value Date/Time   NA 140 05/05/2024 1120   K 4.1 05/05/2024 1120   CL 104 05/05/2024 1120   CO2 27 05/05/2024 1120   GLUCOSE 95 05/05/2024 1120   BUN 11 05/05/2024 1120   CREATININE 0.52 05/05/2024 1120   CALCIUM 9.3 05/05/2024 1120   PROT 6.7 05/05/2024 1120   ALBUMIN 4.1 05/05/2024 1120   AST 23 05/05/2024 1120   ALT 12 05/05/2024 1120   ALKPHOS 62 05/05/2024 1120   BILITOT 0.4 05/05/2024 1120   GFRNONAA >60 05/05/2024 1120    ASSESSMENT and THERAPY PLAN:  Assessment and Plan Assessment & Plan Myelodysplastic syndrome Continued management with Idhifa . Blood counts today reviewed and normal.  Unilateral lower extremity swelling, evaluation ongoing MR femur, no soft tissue mass or collection identified  Fatigue and excessive daytime sleepiness Reports fatigue and excessive sleepiness related to IDHIFA  She is only taking it once a week now, she says she is not able to tolerate it more often than that If she continues to tolerate it poorly, we can consider venetoclax or azacytidine again.  RTC in 4 weeks with labs.   All questions were answered. The patient knows to call the clinic with any problems, questions or concerns. We can certainly see the patient much sooner if necessary.  Total encounter time:20 minutes*in face-to-face visit time, chart review, lab review, care coordination, order entry, and documentation of the encounter time.    *Total Encounter Time as defined by the Centers for Medicare and Medicaid Services includes, in addition to the face-to-face time of a patient visit (documented in the note above) non-face-to-face time: obtaining and reviewing outside history, ordering and reviewing  medications, tests or procedures, care coordination (communications with other health care professionals or caregivers) and documentation in the medical record.

## 2024-06-07 ENCOUNTER — Other Ambulatory Visit: Payer: Self-pay | Admitting: Hematology and Oncology

## 2024-06-08 ENCOUNTER — Encounter: Payer: Self-pay | Admitting: Hematology and Oncology

## 2024-06-10 ENCOUNTER — Other Ambulatory Visit: Payer: Self-pay

## 2024-06-12 ENCOUNTER — Other Ambulatory Visit: Payer: Self-pay

## 2024-06-15 ENCOUNTER — Other Ambulatory Visit: Payer: Self-pay

## 2024-06-22 ENCOUNTER — Other Ambulatory Visit: Payer: Self-pay

## 2024-07-01 ENCOUNTER — Other Ambulatory Visit: Payer: Self-pay

## 2024-07-02 ENCOUNTER — Other Ambulatory Visit: Payer: Self-pay

## 2024-07-02 ENCOUNTER — Inpatient Hospital Stay (HOSPITAL_BASED_OUTPATIENT_CLINIC_OR_DEPARTMENT_OTHER): Admitting: Hematology and Oncology

## 2024-07-02 ENCOUNTER — Inpatient Hospital Stay: Attending: Hematology and Oncology

## 2024-07-02 VITALS — BP 118/68 | HR 80 | Temp 97.4°F | Resp 17 | Wt 209.0 lb

## 2024-07-02 DIAGNOSIS — Z840 Family history of diseases of the skin and subcutaneous tissue: Secondary | ICD-10-CM | POA: Diagnosis not present

## 2024-07-02 DIAGNOSIS — K5900 Constipation, unspecified: Secondary | ICD-10-CM | POA: Diagnosis not present

## 2024-07-02 DIAGNOSIS — Z91040 Latex allergy status: Secondary | ICD-10-CM | POA: Insufficient documentation

## 2024-07-02 DIAGNOSIS — Z87891 Personal history of nicotine dependence: Secondary | ICD-10-CM | POA: Diagnosis not present

## 2024-07-02 DIAGNOSIS — D46Z Other myelodysplastic syndromes: Secondary | ICD-10-CM | POA: Insufficient documentation

## 2024-07-02 DIAGNOSIS — Z8261 Family history of arthritis: Secondary | ICD-10-CM | POA: Insufficient documentation

## 2024-07-02 DIAGNOSIS — R5383 Other fatigue: Secondary | ICD-10-CM | POA: Diagnosis not present

## 2024-07-02 DIAGNOSIS — R140 Abdominal distension (gaseous): Secondary | ICD-10-CM | POA: Diagnosis not present

## 2024-07-02 DIAGNOSIS — R112 Nausea with vomiting, unspecified: Secondary | ICD-10-CM | POA: Insufficient documentation

## 2024-07-02 DIAGNOSIS — Z820 Family history of epilepsy and other diseases of the nervous system: Secondary | ICD-10-CM | POA: Insufficient documentation

## 2024-07-02 DIAGNOSIS — Z91030 Bee allergy status: Secondary | ICD-10-CM | POA: Insufficient documentation

## 2024-07-02 DIAGNOSIS — Z9089 Acquired absence of other organs: Secondary | ICD-10-CM | POA: Diagnosis not present

## 2024-07-02 DIAGNOSIS — T451X5A Adverse effect of antineoplastic and immunosuppressive drugs, initial encounter: Secondary | ICD-10-CM | POA: Insufficient documentation

## 2024-07-02 DIAGNOSIS — R1013 Epigastric pain: Secondary | ICD-10-CM | POA: Insufficient documentation

## 2024-07-02 DIAGNOSIS — F419 Anxiety disorder, unspecified: Secondary | ICD-10-CM | POA: Insufficient documentation

## 2024-07-02 DIAGNOSIS — Z825 Family history of asthma and other chronic lower respiratory diseases: Secondary | ICD-10-CM | POA: Insufficient documentation

## 2024-07-02 LAB — CBC WITH DIFFERENTIAL (CANCER CENTER ONLY)
Abs Immature Granulocytes: 0.01 K/uL (ref 0.00–0.07)
Basophils Absolute: 0 K/uL (ref 0.0–0.1)
Basophils Relative: 1 %
Eosinophils Absolute: 0 K/uL (ref 0.0–0.5)
Eosinophils Relative: 0 %
HCT: 39.7 % (ref 36.0–46.0)
Hemoglobin: 13.3 g/dL (ref 12.0–15.0)
Immature Granulocytes: 0 %
Lymphocytes Relative: 39 %
Lymphs Abs: 1.7 K/uL (ref 0.7–4.0)
MCH: 34.2 pg — ABNORMAL HIGH (ref 26.0–34.0)
MCHC: 33.5 g/dL (ref 30.0–36.0)
MCV: 102.1 fL — ABNORMAL HIGH (ref 80.0–100.0)
Monocytes Absolute: 1.3 K/uL — ABNORMAL HIGH (ref 0.1–1.0)
Monocytes Relative: 30 %
Neutro Abs: 1.3 K/uL — ABNORMAL LOW (ref 1.7–7.7)
Neutrophils Relative %: 30 %
Platelet Count: 165 K/uL (ref 150–400)
RBC: 3.89 MIL/uL (ref 3.87–5.11)
RDW: 16 % — ABNORMAL HIGH (ref 11.5–15.5)
WBC Count: 4.4 K/uL (ref 4.0–10.5)
nRBC: 0 % (ref 0.0–0.2)

## 2024-07-02 LAB — CMP (CANCER CENTER ONLY)
ALT: 12 U/L (ref 0–44)
AST: 22 U/L (ref 15–41)
Albumin: 4.1 g/dL (ref 3.5–5.0)
Alkaline Phosphatase: 68 U/L (ref 38–126)
Anion gap: 12 (ref 5–15)
BUN: 10 mg/dL (ref 8–23)
CO2: 26 mmol/L (ref 22–32)
Calcium: 9.2 mg/dL (ref 8.9–10.3)
Chloride: 102 mmol/L (ref 98–111)
Creatinine: 0.53 mg/dL (ref 0.44–1.00)
GFR, Estimated: >60 mL/min
Glucose, Bld: 102 mg/dL — ABNORMAL HIGH (ref 70–99)
Potassium: 4.3 mmol/L (ref 3.5–5.1)
Sodium: 139 mmol/L (ref 135–145)
Total Bilirubin: 0.3 mg/dL (ref 0.0–1.2)
Total Protein: 7.3 g/dL (ref 6.5–8.1)

## 2024-07-02 MED ORDER — PROCHLORPERAZINE MALEATE 5 MG PO TABS: 5.0000 mg | ORAL_TABLET | Freq: Four times a day (QID) | ORAL | 1 refills | Status: AC | PRN

## 2024-07-02 NOTE — Progress Notes (Signed)
 [facility] Cancer Follow up:    [name], [name] ORN, MD [address] [place] [address]   DIAGNOSIS: Myelodysplastic Syndrome  SUMMARY OF ONCOLOGIC HISTORY: Oncology History  MDS (myelodysplastic syndrome), high grade (HCC)  01/07/2023 Initial Biopsy   Bone marrow biopsy:cellular bone marrow with focal areas of hypercellularity (approximately up to  50%).  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  Blasts appear mildly mildly increased, approximately 5% to focally up to 10%.  Flow cytometric analysis reveals 5% CD34 positive blasts.  While the findings could be attributable to the patient's coexisting conditions, the possibility of a myeloid neoplasm such as myelodysplastic neoplasm with increased blasts cannot be entirely excluded. normal karyotype   01/29/2023 Initial Diagnosis   MDS (myelodysplastic syndrome), high grade (HCC)   02/04/2023 - 02/06/2023 Chemotherapy   Patient is on Treatment Plan : MYELODYSPLASIA  Azacitidine  IV D1-5 q28d     03/04/2023 - 07/29/2023 Chemotherapy   Patient is on Treatment Plan : MYELODYSPLASIA Decitabine  D1-5 q28d      Normal karyotype - 8, XX NGS - ASXL1, CBL, IDH2 and SRSF2 mutations.  PB CBC: WBC 2.4, 29% neutrophils - ANC 696, hgb 11.1, platelets 61  01/29/2023 Initial Diagnosis  MDS (myelodysplastic syndrome), high grade (CMD)   CURRENT THERAPY: Azacitidine   History of Present Illness  [name] [name] is a 79 year old female with malignant neoplasm on oral idhifa  who presents for follow-up of persistent gastrointestinal side effects and management of chemotherapy-induced nausea and vomiting.  Over the past month, she has developed persistent gastrointestinal symptoms, including significant epigastric pain and abdominal bloating, described as a sensation of air distension. The discomfort is often described as soreness and is associated with her cancer therapy. She also experiences a persistent burning sensation of  the skin following each dose, which can last for a week or more. She denies vomiting or diarrhea.  For antiemetic management, she has been using ondansetron  as needed, but this has resulted in increased abdominal bloating, discomfort, and constipation, which she manages as necessary, particularly when anticipating leaving the house. She reports persistent fatigue, describing herself as sleepy all the time, and expresses anxiety related to her ongoing symptoms and medication effects.  Rest of the pertinent 10 point ROS reviewed and neg.  Patient Active Problem List   Diagnosis Date Noted   Dysphagia 04/28/2023   Pancytopenia due to chemotherapy 04/26/2023   Hypokalemia 04/26/2023   Neutropenic fever 04/25/2023   Hives 03/18/2023   MDS (myelodysplastic syndrome), high grade (HCC) 01/29/2023   Obesity 03/02/2021   Other long term (current) drug therapy 03/02/2021   Primary osteoarthritis 03/02/2021   SOB (shortness of breath) 12/08/2020   Pericardial effusion 12/08/2020   Dry eyes/dry mouth 05/25/2019   Bronchitis, mucopurulent recurrent (HCC) 02/26/2019   Moderate persistent asthma without complication 02/24/2019   Perennial and seasonal allergic rhinitis 02/24/2019   Seasonal allergic conjunctivitis 02/24/2019   Primary osteoarthritis of both knees 04/10/2016   Lung nodule 08/19/2014   HTN (hypertension) 08/07/2012   HLD (hyperlipidemia) 09/26/2011   Asthma 09/06/2010   Depression 09/06/2010   History of allergy 09/06/2010   Hypothyroidism 09/06/2010   Lower back pain 09/06/2010   Arthritis 09/06/2010   Pain in joint, pelvic region and thigh 09/06/2010   Peripheral neuropathy 09/06/2010   Psoriasis 09/06/2010   Therapeutic drug monitoring 09/06/2010   Tinnitus 09/06/2010   Vitamin B 12 deficiency 09/06/2010   Cervical spondylosis without myelopathy 04/24/2006   Tear of lateral cartilage or meniscus  of knee, current 03/13/2006    is allergic to bee pollen, latex, pollen  extract-tree extract [pollen extract], cat dander, cat hair extract, dust mite extract, and molds & smuts.  MEDICAL HISTORY: Past Medical History:  Diagnosis Date   Asthma    Diverticulitis    Hypothyroid    MDS (myelodysplastic syndrome) (HCC) 01/2023   Psoriatic arthritis (HCC)     SURGICAL HISTORY: Past Surgical History:  Procedure Laterality Date   COLONOSCOPY  08/2021   HIP SURGERY Bilateral    IR IMAGING GUIDED PORT INSERTION  02/20/2023   TONSILLECTOMY     TUBAL LIGATION      SOCIAL HISTORY: Social History   Socioeconomic History   Marital status: Widowed    Spouse name: Not on file   Number of children: Not on file   Years of education: Not on file   Highest education level: Not on file  Occupational History   Not on file  Tobacco Use   Smoking status: Former    Current packs/day: 0.00    Average packs/day: 0.1 packs/day for 33.4 years (3.3 ttl pk-yrs)    Types: Cigarettes    Start date: 37    Quit date: 11/18/2000    Years since quitting: 23.6   Smokeless tobacco: Never   Tobacco comments:    3 cigarettes a day  Vaping Use   Vaping status: Never Used  Substance and Sexual Activity   Alcohol use: Yes   Drug use: Never   Sexual activity: Not Currently  Other Topics Concern   Not on file  Social History Narrative   Lives with children   Social Drivers of Health   Tobacco Use: Medium Risk (01/28/2024)   Patient History    Smoking Tobacco Use: Former    Smokeless Tobacco Use: Never    Passive Exposure: Not on Actuary Strain: Not on file  Food Insecurity: No Food Insecurity (04/26/2023)   Hunger Vital Sign    Worried About Running Out of Food in the Last Year: Never true    Ran Out of Food in the Last Year: Never true  Transportation Needs: No Transportation Needs (04/26/2023)   PRAPARE - Administrator, Civil Service (Medical): No    Lack of Transportation (Non-Medical): No  Physical Activity: Not on file  Stress: Not  on file  Social Connections: Not on file  Intimate Partner Violence: Not At Risk (04/26/2023)   Humiliation, Afraid, Rape, and Kick questionnaire    Fear of Current or Ex-Partner: No    Emotionally Abused: No    Physically Abused: No    Sexually Abused: No  Depression (PHQ2-9): Low Risk (11/24/2022)   Depression (PHQ2-9)    PHQ-2 Score: 0  Alcohol Screen: Not on file  Housing: Low Risk (04/26/2023)   Housing    Last Housing Risk Score: 0  Utilities: Not At Risk (04/26/2023)   AHC Utilities    Threatened with loss of utilities: No  Health Literacy: Not on file    FAMILY HISTORY: Family History  Problem Relation Age of Onset   Asthma Mother    Allergic rhinitis Mother    Rheum arthritis Mother    Allergic rhinitis Father    Alzheimer's disease Father    Eczema Brother    Allergic rhinitis Brother    Colon cancer Neg Hx    Esophageal cancer Neg Hx    Stomach cancer Neg Hx    Rectal cancer Neg Hx  PHYSICAL EXAMINATION      Vitals:   07/02/24 1156  BP: 118/68  Pulse: 80  Resp: 17  Temp: (!) 97.4 F (36.3 C)  SpO2: 95%   Physical Exam Constitutional:      Appearance: Normal appearance.  Cardiovascular:     Rate and Rhythm: Normal rate and regular rhythm.  Pulmonary:     Effort: Pulmonary effort is normal.     Breath sounds: Normal breath sounds.  Abdominal:     General: Abdomen is flat.     Palpations: Abdomen is soft.  Musculoskeletal:        General: Normal range of motion.     Cervical back: Normal range of motion and neck supple. No rigidity.  Lymphadenopathy:     Cervical: No cervical adenopathy.  Skin:    General: Skin is warm and dry.  Neurological:     Mental Status: She is alert.  Psychiatric:        Mood and Affect: Mood normal.      LABORATORY DATA:  CBC    Component Value Date/Time   WBC 4.4 07/02/2024 1059   WBC 3.4 (L) 11/12/2023 0956   RBC 3.89 07/02/2024 1059   HGB 13.3 07/02/2024 1059   HCT 39.7 07/02/2024 1059   HCT  39.0 11/24/2022 1035   PLT 165 07/02/2024 1059   MCV 102.1 (H) 07/02/2024 1059   MCH 34.2 (H) 07/02/2024 1059   MCHC 33.5 07/02/2024 1059   RDW 16.0 (H) 07/02/2024 1059   LYMPHSABS 1.7 07/02/2024 1059   MONOABS 1.3 (H) 07/02/2024 1059   EOSABS 0.0 07/02/2024 1059   BASOSABS 0.0 07/02/2024 1059    CMP     Component Value Date/Time   NA 139 07/02/2024 1059   K 4.3 07/02/2024 1059   CL 102 07/02/2024 1059   CO2 26 07/02/2024 1059   GLUCOSE 102 (H) 07/02/2024 1059   BUN 10 07/02/2024 1059   CREATININE 0.53 07/02/2024 1059   CALCIUM 9.2 07/02/2024 1059   PROT 7.3 07/02/2024 1059   ALBUMIN 4.1 07/02/2024 1059   AST 22 07/02/2024 1059   ALT 12 07/02/2024 1059   ALKPHOS 68 07/02/2024 1059   BILITOT 0.3 07/02/2024 1059   GFRNONAA >60 07/02/2024 1059    ASSESSMENT and THERAPY PLAN:  Assessment and Plan Assessment & Plan Myelodysplastic syndrome Continued management with Idhifa . Blood counts today reviewed and normal. She has been only able to take it once a week. We reviewed other options like venetoclax or going back on decitabine  She wants to consider these at progression.  Chemotherapy-induced nausea and vomiting Significant nausea, bloating, and discomfort persist despite antiemetic therapy. Zofran  caused adverse gastrointestinal effects. - Discontinued Zofran . - Prescribed prochlorperazine ; sent prescription to [facility] on [address]. - Educated on potential drowsiness with prochlorperazine  and advised to monitor efficacy and side effects.   All questions were answered. The patient knows to call the clinic with any problems, questions or concerns. We can certainly see the patient much sooner if necessary.  Total encounter time:20 minutes*in face-to-face visit time, chart review, lab review, care coordination, order entry, and documentation of the encounter time.    *Total Encounter Time as defined by the Centers for Medicare and Medicaid Services includes, in  addition to the face-to-face time of a patient visit (documented in the note above) non-face-to-face time: obtaining and reviewing outside history, ordering and reviewing medications, tests or procedures, care coordination (communications with other health care professionals or caregivers) and documentation in the medical record.

## 2024-07-03 ENCOUNTER — Other Ambulatory Visit (HOSPITAL_COMMUNITY): Payer: Self-pay

## 2024-07-03 ENCOUNTER — Encounter: Payer: Self-pay | Admitting: Pharmacist

## 2024-07-03 ENCOUNTER — Telehealth: Payer: Self-pay

## 2024-07-03 ENCOUNTER — Encounter: Payer: Self-pay | Admitting: Hematology and Oncology

## 2024-07-03 ENCOUNTER — Other Ambulatory Visit: Payer: Self-pay

## 2024-07-03 NOTE — Telephone Encounter (Signed)
 Oral Oncology Patient Advocate Encounter  Was successful in securing patient a [phone] grant from [facility] to provide copayment coverage for Idhifa .  This will keep the out of pocket expense at $0.     [employer] ID: [id number]   The billing information is as follows and has been shared with [facility].    RxBin: [id number] PCN: [id number] Member ID: [id number] Group ID: [id number] Dates of Eligibility: 06/03/24 through 06/02/25  Fund:  MDS  Lucie Lamer, CPhT [place]  [facility] Oncology Pharmacy Patient Advocate Specialist [address] [place] Phone: [phone]  Fax: [phone] [email or web address]

## 2024-07-06 ENCOUNTER — Other Ambulatory Visit: Payer: Self-pay

## 2024-07-06 ENCOUNTER — Other Ambulatory Visit (HOSPITAL_COMMUNITY): Payer: Self-pay

## 2024-07-07 ENCOUNTER — Other Ambulatory Visit (HOSPITAL_COMMUNITY): Payer: Self-pay

## 2024-07-07 ENCOUNTER — Other Ambulatory Visit: Payer: Self-pay

## 2024-07-07 NOTE — Progress Notes (Signed)
 Specialty Pharmacy Ongoing Clinical Assessment Note  [name] [name] is a 79 y.o. female who is being followed by the specialty pharmacy service for RxSp Oncology   Patient's specialty medication(s) reviewed today: Enasidenib Mesylate  (IDHIFA )   Missed doses in the last 4 weeks: More than 5 (Patient only takes once a week- her provider does not agree, but is aware of her dosing.)   Patient/Caregiver did not have any additional questions or concerns.   Therapeutic benefit summary: Patient is achieving benefit (labs remains stable as of 07/02/24, despite patient lowering her dose)   Adverse events/side effects summary: Experienced adverse events/side effects (nausea (managed with Compazine ) and diarrhea (managed with Imoidum PRN)- both are tolerable at this time)   Patient's therapy is appropriate to: Continue    Goals Addressed             This Visit's Progress    Maintain optimal adherence to therapy   Worsening    Patient is not on track and worsening. Patient will maintain adherence.  Patient only takes once a week- her provider does not agree, but is aware of her dosing.           Follow up: 3 months  Silvano LOISE Dolly Specialty Pharmacist

## 2024-07-07 NOTE — Progress Notes (Signed)
 Specialty Pharmacy Refill Coordination Note  [name] [name] is a 79 y.o. female contacted today regarding refills of specialty medication(s) Enasidenib Mesylate  (IDHIFA )   Patient requested Delivery   Delivery date: 07/15/24   Verified address: [name] [address]   Medication will be filled on: 07/14/24

## 2024-07-08 ENCOUNTER — Other Ambulatory Visit: Payer: Self-pay

## 2024-07-08 NOTE — Progress Notes (Signed)
 Patient was contacted by the pharmacy regarding their specialty medication(s) Enasidenib Mesylate  (IDHIFA )  to reschedule an earlier delivery date, due to impending cold weather conditions. Medication(s) will be filled 07/09/24 for a delivery by 07/10/24

## 2024-07-09 ENCOUNTER — Other Ambulatory Visit: Payer: Self-pay

## 2024-07-24 ENCOUNTER — Other Ambulatory Visit: Payer: Self-pay | Admitting: Allergy & Immunology

## 2024-08-04 ENCOUNTER — Ambulatory Visit: Admitting: Allergy & Immunology

## 2024-08-13 ENCOUNTER — Inpatient Hospital Stay: Admitting: Hematology and Oncology

## 2024-08-13 ENCOUNTER — Inpatient Hospital Stay: Attending: Hematology and Oncology
